# Patient Record
Sex: Male | Born: 1940 | Race: White | Hispanic: No | Marital: Married | Smoking: Never smoker
Health system: Southern US, Community
[De-identification: ages and names within clinical notes are randomized; demographics above are authoritative.]

## PROBLEM LIST (undated history)

## (undated) DIAGNOSIS — B029 Zoster without complications: Secondary | ICD-10-CM

## (undated) DIAGNOSIS — R059 Cough, unspecified: Secondary | ICD-10-CM

## (undated) DIAGNOSIS — M1990 Unspecified osteoarthritis, unspecified site: Secondary | ICD-10-CM

## (undated) DIAGNOSIS — E559 Vitamin D deficiency, unspecified: Secondary | ICD-10-CM

## (undated) DIAGNOSIS — R1310 Dysphagia, unspecified: Secondary | ICD-10-CM

## (undated) DIAGNOSIS — E785 Hyperlipidemia, unspecified: Secondary | ICD-10-CM

## (undated) DIAGNOSIS — N400 Enlarged prostate without lower urinary tract symptoms: Secondary | ICD-10-CM

## (undated) DIAGNOSIS — M545 Low back pain, unspecified: Secondary | ICD-10-CM

## (undated) DIAGNOSIS — K5730 Diverticulosis of large intestine without perforation or abscess without bleeding: Secondary | ICD-10-CM

## (undated) DIAGNOSIS — Z125 Encounter for screening for malignant neoplasm of prostate: Secondary | ICD-10-CM

## (undated) DIAGNOSIS — H269 Unspecified cataract: Secondary | ICD-10-CM

## (undated) DIAGNOSIS — R350 Frequency of micturition: Secondary | ICD-10-CM

## (undated) DIAGNOSIS — R05 Cough: Secondary | ICD-10-CM

## (undated) DIAGNOSIS — Q150 Congenital glaucoma: Secondary | ICD-10-CM

## (undated) DIAGNOSIS — M79609 Pain in unspecified limb: Secondary | ICD-10-CM

## (undated) DIAGNOSIS — I4949 Other premature depolarization: Secondary | ICD-10-CM

## (undated) DIAGNOSIS — H409 Unspecified glaucoma: Secondary | ICD-10-CM

## (undated) DIAGNOSIS — M722 Plantar fascial fibromatosis: Secondary | ICD-10-CM

## (undated) DIAGNOSIS — K225 Diverticulum of esophagus, acquired: Secondary | ICD-10-CM

## (undated) DIAGNOSIS — Z79899 Other long term (current) drug therapy: Secondary | ICD-10-CM

## (undated) DIAGNOSIS — I781 Nevus, non-neoplastic: Secondary | ICD-10-CM

## (undated) DIAGNOSIS — I10 Essential (primary) hypertension: Secondary | ICD-10-CM

## (undated) DIAGNOSIS — R209 Unspecified disturbances of skin sensation: Secondary | ICD-10-CM

## (undated) DIAGNOSIS — R739 Hyperglycemia, unspecified: Secondary | ICD-10-CM

## (undated) DIAGNOSIS — L299 Pruritus, unspecified: Secondary | ICD-10-CM

## (undated) HISTORY — DX: Other premature depolarization: I49.49

## (undated) HISTORY — DX: Hyperglycemia, unspecified: R73.9

## (undated) HISTORY — DX: Hyperlipidemia, unspecified: E78.5

## (undated) HISTORY — DX: Low back pain, unspecified: M54.50

## (undated) HISTORY — DX: Pain in unspecified limb: M79.609

## (undated) HISTORY — DX: Nevus, non-neoplastic: I78.1

## (undated) HISTORY — DX: Plantar fascial fibromatosis: M72.2

## (undated) HISTORY — DX: Pruritus, unspecified: L29.9

## (undated) HISTORY — DX: Diverticulosis of large intestine without perforation or abscess without bleeding: K57.30

## (undated) HISTORY — DX: Unspecified glaucoma: H40.9

## (undated) HISTORY — DX: Vitamin D deficiency, unspecified: E55.9

## (undated) HISTORY — PX: LARYNGOSCOPY: SUR817

## (undated) HISTORY — DX: Frequency of micturition: R35.0

## (undated) HISTORY — DX: Encounter for screening for malignant neoplasm of prostate: Z12.5

## (undated) HISTORY — DX: Cough: R05

## (undated) HISTORY — DX: Cough, unspecified: R05.9

## (undated) HISTORY — DX: Unspecified disturbances of skin sensation: R20.9

## (undated) HISTORY — DX: Dysphagia, unspecified: R13.10

## (undated) HISTORY — DX: Low back pain: M54.5

## (undated) HISTORY — DX: Diverticulum of esophagus, acquired: K22.5

## (undated) HISTORY — DX: Unspecified osteoarthritis, unspecified site: M19.90

## (undated) HISTORY — DX: Unspecified cataract: H26.9

## (undated) HISTORY — DX: Zoster without complications: B02.9

## (undated) HISTORY — DX: Benign prostatic hyperplasia without lower urinary tract symptoms: N40.0

## (undated) HISTORY — DX: Essential (primary) hypertension: I10

## (undated) HISTORY — DX: Other long term (current) drug therapy: Z79.899

---
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
## 1975-10-05 HISTORY — PX: CATARACT EXTRACTION BILATERAL W/ ANTERIOR VITRECTOMY: SHX1304

## 1991-10-05 HISTORY — PX: HERNIA REPAIR: SHX51

## 1997-10-04 HISTORY — PX: FLEXIBLE SIGMOIDOSCOPY: SHX1649

## 2002-08-14 ENCOUNTER — Emergency Department (HOSPITAL_COMMUNITY): Admission: EM | Admit: 2002-08-14 | Discharge: 2002-08-14 | Payer: Self-pay | Admitting: Emergency Medicine

## 2002-08-14 ENCOUNTER — Encounter: Payer: Self-pay | Admitting: Emergency Medicine

## 2005-08-10 ENCOUNTER — Ambulatory Visit: Payer: Self-pay | Admitting: Internal Medicine

## 2005-08-12 ENCOUNTER — Ambulatory Visit (HOSPITAL_COMMUNITY): Admission: RE | Admit: 2005-08-12 | Discharge: 2005-08-12 | Payer: Self-pay | Admitting: Internal Medicine

## 2006-10-04 HISTORY — PX: INCISIONAL HERNIA REPAIR: SHX193

## 2007-12-01 ENCOUNTER — Encounter: Admission: RE | Admit: 2007-12-01 | Discharge: 2007-12-01 | Payer: Self-pay | Admitting: Otolaryngology

## 2009-08-07 ENCOUNTER — Encounter: Admission: RE | Admit: 2009-08-07 | Discharge: 2009-08-07 | Payer: Self-pay | Admitting: Otolaryngology

## 2009-09-03 ENCOUNTER — Ambulatory Visit (HOSPITAL_COMMUNITY): Admission: RE | Admit: 2009-09-03 | Discharge: 2009-09-04 | Payer: Self-pay | Admitting: Otolaryngology

## 2009-09-16 ENCOUNTER — Encounter: Payer: Self-pay | Admitting: Internal Medicine

## 2010-01-26 ENCOUNTER — Encounter: Admission: RE | Admit: 2010-01-26 | Discharge: 2010-01-26 | Payer: Self-pay | Admitting: Otolaryngology

## 2010-01-27 ENCOUNTER — Encounter: Payer: Self-pay | Admitting: Internal Medicine

## 2010-12-15 ENCOUNTER — Emergency Department (HOSPITAL_COMMUNITY)
Admission: EM | Admit: 2010-12-15 | Discharge: 2010-12-15 | Disposition: A | Discharge status: Home / Self Care | Payer: PRIVATE HEALTH INSURANCE | Attending: Emergency Medicine | Admitting: Emergency Medicine

## 2010-12-15 ENCOUNTER — Emergency Department (HOSPITAL_COMMUNITY): Payer: PRIVATE HEALTH INSURANCE

## 2010-12-15 DIAGNOSIS — S0003XA Contusion of scalp, initial encounter: Secondary | ICD-10-CM | POA: Insufficient documentation

## 2010-12-15 DIAGNOSIS — R51 Headache: Secondary | ICD-10-CM | POA: Insufficient documentation

## 2010-12-15 DIAGNOSIS — R079 Chest pain, unspecified: Secondary | ICD-10-CM | POA: Insufficient documentation

## 2010-12-15 DIAGNOSIS — M79609 Pain in unspecified limb: Secondary | ICD-10-CM | POA: Insufficient documentation

## 2010-12-15 DIAGNOSIS — IMO0002 Reserved for concepts with insufficient information to code with codable children: Secondary | ICD-10-CM | POA: Insufficient documentation

## 2010-12-15 DIAGNOSIS — W108XXA Fall (on) (from) other stairs and steps, initial encounter: Secondary | ICD-10-CM | POA: Insufficient documentation

## 2010-12-15 DIAGNOSIS — S20219A Contusion of unspecified front wall of thorax, initial encounter: Secondary | ICD-10-CM | POA: Insufficient documentation

## 2010-12-15 DIAGNOSIS — S1093XA Contusion of unspecified part of neck, initial encounter: Secondary | ICD-10-CM | POA: Insufficient documentation

## 2010-12-15 DIAGNOSIS — I10 Essential (primary) hypertension: Secondary | ICD-10-CM | POA: Insufficient documentation

## 2011-01-06 LAB — CBC
HCT: 45.5 % (ref 39.0–52.0)
Hemoglobin: 15.8 g/dL (ref 13.0–17.0)
MCHC: 34.7 g/dL (ref 30.0–36.0)
MCV: 90.4 fL (ref 78.0–100.0)
Platelets: 186 10*3/uL (ref 150–400)
RBC: 5.03 MIL/uL (ref 4.22–5.81)
RDW: 13.3 % (ref 11.5–15.5)
WBC: 6.9 10*3/uL (ref 4.0–10.5)

## 2011-01-06 LAB — BASIC METABOLIC PANEL
BUN: 13 mg/dL (ref 6–23)
CO2: 29 mEq/L (ref 19–32)
Calcium: 10.1 mg/dL (ref 8.4–10.5)
Chloride: 103 mEq/L (ref 96–112)
Creatinine, Ser: 0.76 mg/dL (ref 0.4–1.5)
GFR calc Af Amer: >60 mL/min (ref >60)
GFR calc non Af Amer: >60 mL/min (ref >60)
Glucose, Bld: 106 mg/dL — ABNORMAL HIGH (ref 70–99)
Potassium: 4.3 mEq/L (ref 3.5–5.1)
Sodium: 140 mEq/L (ref 135–145)

## 2012-10-04 DIAGNOSIS — K5730 Diverticulosis of large intestine without perforation or abscess without bleeding: Secondary | ICD-10-CM

## 2012-10-04 HISTORY — DX: Diverticulosis of large intestine without perforation or abscess without bleeding: K57.30

## 2013-01-30 ENCOUNTER — Encounter: Payer: Self-pay | Admitting: Geriatric Medicine

## 2013-01-30 ENCOUNTER — Encounter: Payer: Self-pay | Admitting: Internal Medicine

## 2013-01-30 ENCOUNTER — Ambulatory Visit (INDEPENDENT_AMBULATORY_CARE_PROVIDER_SITE_OTHER): Payer: PRIVATE HEALTH INSURANCE | Admitting: Internal Medicine

## 2013-01-30 VITALS — BP 158/96 | HR 96 | Ht 74.0 in | Wt 196.0 lb

## 2013-01-30 DIAGNOSIS — N529 Male erectile dysfunction, unspecified: Secondary | ICD-10-CM | POA: Insufficient documentation

## 2013-01-30 DIAGNOSIS — R7989 Other specified abnormal findings of blood chemistry: Secondary | ICD-10-CM | POA: Insufficient documentation

## 2013-01-30 DIAGNOSIS — E785 Hyperlipidemia, unspecified: Secondary | ICD-10-CM

## 2013-01-30 DIAGNOSIS — I10 Essential (primary) hypertension: Secondary | ICD-10-CM

## 2013-01-30 NOTE — Progress Notes (Signed)
  Subjective:    Patient ID: Kirk [name], male    DOB: 1941/10/03, 72 y.o.   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 They want him to go to Occupational Therapy for a road test, but it would cost $300 or more. Has also been told to get a letter that says the drivers test needs to be done.  Has had a odd undescribable feeling in the lower abd. He thinks he needs some x-rays. No pain, change in the stools, or blood. Afebrile. Bladder seems OK.  He monitors BP at home. Says values there are in the normal range.   Review of Systems  Constitutional: Negative for fever, diaphoresis, activity change, appetite change and fatigue.  HENT: Negative for hearing loss, facial swelling, neck pain, neck stiffness and ear discharge.   Eyes: Positive for visual disturbance.       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 Sees Dr Hazle Quant on 02/08/13.  Respiratory: Negative.   Cardiovascular: Negative.   Gastrointestinal: Negative.   Endocrine:       Hyperglycemia.  Genitourinary: Negative.   Musculoskeletal: Negative.   Skin: Negative.   Allergic/Immunologic: Negative.   Neurological: Negative.   Hematological: Negative.   Psychiatric/Behavioral: Negative.       BP 158/96  Pulse 96  Ht 6\' 2"  (1.88 m)  Wt 196 lb (88.905 kg)  BMI 25.15 kg/m2  Objective:   Physical Exam  Constitutional: He is oriented to person, place, and time. He appears well-developed and well-nourished.  HENT:  Head: Normocephalic.  Right Ear: External ear normal.  Left Ear: External ear normal.  Nose: Nose normal.  Mouth/Throat: No oropharyngeal exudate.  Eyes:  Thick glasses. Congenital glaucoma. No ocular pain, tearing, or other symptoms.   Neck: Normal range of motion. Neck supple. No JVD present. No tracheal deviation present. No thyromegaly present.  Cardiovascular: Normal rate, regular rhythm, normal heart sounds and intact distal pulses.  Exam reveals no gallop and no friction rub.   No murmur  heard. Pulmonary/Chest: Effort normal and breath sounds normal. No respiratory distress. He has no wheezes. He has no rales.  Abdominal: Soft. Bowel sounds are normal. He exhibits no distension and no mass. There is no tenderness. There is no rebound.  Musculoskeletal: Normal range of motion. He exhibits no edema and no tenderness.  Lymphadenopathy:    He has no cervical adenopathy.  Neurological: He is alert and oriented to person, place, and time. He has normal reflexes. No cranial nerve deficit. Coordination normal.  Skin: Skin is warm and dry. No rash noted. He is not diaphoretic. No erythema. No pallor.  Psychiatric: He has a normal mood and affect. His behavior is normal. Judgment and thought content normal.       Assessment & Plan:  401.9 HTN    Other abnormal blood chemistry: elevated glucose. - Plan: Hemoglobin A1c, CMP  Impotence : he is able to get erection, but not complete intercourse to climax. His wife is not interested in sex anymore.  Other and unspecified hyperlipidemia - Plan: Lipid Panel  I do not find a reason to order abdominal X-rays. He should consider getting colonoscopy, but he will not commit to it today.

## 2013-01-30 NOTE — Patient Instructions (Addendum)
Continue current medications. Consider colonoscopy scheduling.

## 2013-02-01 DIAGNOSIS — I10 Essential (primary) hypertension: Secondary | ICD-10-CM | POA: Insufficient documentation

## 2013-02-01 DIAGNOSIS — E785 Hyperlipidemia, unspecified: Secondary | ICD-10-CM | POA: Insufficient documentation

## 2013-02-20 ENCOUNTER — Encounter: Payer: Self-pay | Admitting: Internal Medicine

## 2013-02-20 ENCOUNTER — Telehealth: Payer: Self-pay | Admitting: *Deleted

## 2013-02-20 NOTE — Telephone Encounter (Signed)
Error

## 2013-07-30 ENCOUNTER — Other Ambulatory Visit: Payer: PRIVATE HEALTH INSURANCE

## 2013-07-30 DIAGNOSIS — E785 Hyperlipidemia, unspecified: Secondary | ICD-10-CM

## 2013-07-30 DIAGNOSIS — R7989 Other specified abnormal findings of blood chemistry: Secondary | ICD-10-CM

## 2013-07-31 LAB — COMPREHENSIVE METABOLIC PANEL
ALT: 25 IU/L (ref 0–44)
AST: 21 IU/L (ref 0–40)
Albumin/Globulin Ratio: 2 (ref 1.1–2.5)
Albumin: 4.3 g/dL (ref 3.5–4.8)
Alkaline Phosphatase: 66 IU/L (ref 39–117)
BUN/Creatinine Ratio: 14 (ref 10–22)
BUN: 13 mg/dL (ref 8–27)
CO2: 24 mmol/L (ref 18–29)
Calcium: 9.5 mg/dL (ref 8.6–10.2)
Chloride: 100 mmol/L (ref 97–108)
Creatinine, Ser: 0.9 mg/dL (ref 0.76–1.27)
GFR calc Af Amer: 99 mL/min/{1.73_m2} (ref >59)
GFR calc non Af Amer: 86 mL/min/{1.73_m2} (ref >59)
Globulin, Total: 2.1 g/dL (ref 1.5–4.5)
Glucose: 103 mg/dL — ABNORMAL HIGH (ref 65–99)
Potassium: 4.8 mmol/L (ref 3.5–5.2)
Sodium: 140 mmol/L (ref 134–144)
Total Bilirubin: 0.7 mg/dL (ref 0.0–1.2)
Total Protein: 6.4 g/dL (ref 6.0–8.5)

## 2013-07-31 LAB — LIPID PANEL
Chol/HDL Ratio: 4.5 ratio units (ref 0.0–5.0)
Cholesterol, Total: 165 mg/dL (ref 100–199)
HDL: 37 mg/dL — ABNORMAL LOW (ref >39)
LDL Calculated: 92 mg/dL (ref 0–99)
Triglycerides: 178 mg/dL — ABNORMAL HIGH (ref 0–149)
VLDL Cholesterol Cal: 36 mg/dL (ref 5–40)

## 2013-07-31 LAB — HEMOGLOBIN A1C
Est. average glucose Bld gHb Est-mCnc: 134 mg/dL
Hgb A1c MFr Bld: 6.3 % — ABNORMAL HIGH (ref 4.8–5.6)

## 2013-08-01 ENCOUNTER — Ambulatory Visit (INDEPENDENT_AMBULATORY_CARE_PROVIDER_SITE_OTHER): Payer: PRIVATE HEALTH INSURANCE | Admitting: Internal Medicine

## 2013-08-01 ENCOUNTER — Encounter: Payer: Self-pay | Admitting: Internal Medicine

## 2013-08-01 VITALS — BP 138/80 | HR 74 | Temp 98.5°F | Wt 194.0 lb

## 2013-08-01 DIAGNOSIS — E559 Vitamin D deficiency, unspecified: Secondary | ICD-10-CM

## 2013-08-01 DIAGNOSIS — R739 Hyperglycemia, unspecified: Secondary | ICD-10-CM | POA: Insufficient documentation

## 2013-08-01 DIAGNOSIS — K225 Diverticulum of esophagus, acquired: Secondary | ICD-10-CM | POA: Insufficient documentation

## 2013-08-01 DIAGNOSIS — M545 Low back pain, unspecified: Secondary | ICD-10-CM

## 2013-08-01 DIAGNOSIS — H409 Unspecified glaucoma: Secondary | ICD-10-CM

## 2013-08-01 DIAGNOSIS — I10 Essential (primary) hypertension: Secondary | ICD-10-CM

## 2013-08-01 DIAGNOSIS — E785 Hyperlipidemia, unspecified: Secondary | ICD-10-CM

## 2013-08-01 DIAGNOSIS — R7309 Other abnormal glucose: Secondary | ICD-10-CM

## 2013-08-01 DIAGNOSIS — R1310 Dysphagia, unspecified: Secondary | ICD-10-CM | POA: Insufficient documentation

## 2013-08-01 DIAGNOSIS — N400 Enlarged prostate without lower urinary tract symptoms: Secondary | ICD-10-CM

## 2013-08-01 DIAGNOSIS — R109 Unspecified abdominal pain: Secondary | ICD-10-CM

## 2013-08-01 MED ORDER — IRBESARTAN-HYDROCHLOROTHIAZIDE 150-12.5 MG PO TABS: ORAL_TABLET | ORAL | Status: DC

## 2013-08-01 NOTE — Patient Instructions (Signed)
Continue current medications. 

## 2013-08-01 NOTE — Progress Notes (Signed)
Subjective:    Patient ID: Kirk [name], male    DOB: September 28, 1941, 72 y.o.   MRN: [id number]  HPI  Unspecified essential hypertension; controlled on Avalide for several years  Other and unspecified hyperlipidemia: controlled  Unspecified vitamin D deficiency: improved on supplements  Hyperglycemia: normal most recently  Dysphagia, unspecified(787.20): mild issue of food hanging. 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  Lumbago: mild. Does not hinder current daily activities.  Hyperlipidemia: controlled  Unspecified glaucoma(365.9);followed by ophth  Hypertrophy of prostate without urinary obstruction and other lower urinary tract symptoms (LUTS): denies increased nocturia, frequency, or pain.  Abdominal  pain, other specified site: intermiitent and mainly in the lower abd. Has not had change in stools or noted blood in stools. No fever. Gwenith Daily never had colonoscopy.   - Plan: Ambulatory referral to Gastroenterology   Current Outpatient Prescriptions on File Prior to Visit  Medication Sig Dispense Refill  . acetaminophen (TYLENOL) 325 MG tablet Take 650 mg by mouth. One tablet once daily as needed for pain      . brimonidine (ALPHAGAN P) 0.1 % SOLN One drop twice daily in each eye.      . cholecalciferol (VITAMIN D) 1000 UNITS tablet Take 1,000 Units by mouth daily.      . irbesartan-hydrochlorothiazide (AVALIDE) 150-12.5 MG per tablet 1 tablet. Take one tablet daily to control blood pressure      . timolol (BETIMOL) 0.5 % ophthalmic solution Place 1 drop into the right eye daily.       No current facility-administered medications on file prior to visit.   Allergies  Allergen Reactions  . Reserpine   . Sulfa Antibiotics    Family Status  Relation Status Death Age  . Mother Deceased   . Father Deceased   . Brother Deceased     stillborn   Family History  Problem Relation Age of Onset  .  Hypertension Mother   . Dementia Mother   . Kidney disease Father   . CAD Father    History   Social History  . Marital Status: Married    Spouse Name: N/A    Number of Children: N/A  . Years of Education: N/A   Social History Main Topics  . Smoking status: Never Smoker   . Smokeless tobacco: Never Used  . Alcohol Use: No  . Drug Use: No  . Sexual Activity: None   Other Topics Concern  . None   Social History Narrative  . None    Review of Systems  Constitutional: Negative for fever, chills, diaphoresis, activity change, appetite change, fatigue and unexpected weight change.  HENT: Positive for sinus pressure. Negative for ear pain, hearing loss, mouth sores, nosebleeds and sore throat.   Eyes: Positive for visual disturbance (hx glaucoma.).  Respiratory: Negative for cough, choking, shortness of breath, wheezing and stridor.   Cardiovascular: Negative for chest pain, palpitations and leg swelling.  Gastrointestinal: Positive for abdominal pain. Negative for nausea, diarrhea, constipation, blood in stool, abdominal distention and rectal pain.       Mild dysphagia.  Endocrine: Negative for cold intolerance, heat intolerance, polydipsia, polyphagia and polyuria.  Genitourinary: Negative for dysuria, urgency, frequency, flank pain, decreased urine volume, enuresis and difficulty urinating.  Musculoskeletal: Positive for back pain. Negative for arthralgias, gait problem, joint swelling, myalgias and neck pain.  Skin: Negative for color change, pallor and rash.  Allergic/Immunologic: Negative for environmental allergies, food allergies and immunocompromised state.  Neurological: Positive for headaches. Negative for dizziness, tremors, seizures, syncope, facial asymmetry, speech difficulty, weakness, light-headedness and numbness.  Hematological: Negative.   Psychiatric/Behavioral: Negative for suicidal ideas, hallucinations, behavioral problems, confusion, sleep disturbance,  self-injury, dysphoric mood, decreased concentration and agitation. The patient is not nervous/anxious and is not hyperactive.        Objective:BP 138/80  Pulse 74  Temp(Src) 98.5 F (36.9 C) (Oral)  Wt 194 lb (87.998 kg)  BMI 24.9 kg/m2  SpO2 97%    Physical Exam  Constitutional: He is oriented to person, place, and time. He appears well-developed and well-nourished. No distress.  Mildly overweight  HENT:  Head: Normocephalic and atraumatic.  Right Ear: External ear normal.  Left Ear: External ear normal.  Nose: Nose normal.  Some missing teeth.  Eyes: Conjunctivae and EOM are normal.  Abnormal pupils that are small and eccentric. Correctivelenses.  Neck: No JVD present. No tracheal deviation present. No thyromegaly present.  Cardiovascular: Normal rate, regular rhythm, normal heart sounds and intact distal pulses.  Exam reveals no gallop and no friction rub.   No murmur heard. Pulmonary/Chest: No respiratory distress. He has no wheezes. He has no rales.  Abdominal: He exhibits no distension and no mass. There is no tenderness. There is no rebound.  Genitourinary: Prostate normal and penis normal. Guaiac negative stool. No penile tenderness.  Musculoskeletal: Normal range of motion. He exhibits no edema and no tenderness.  Lymphadenopathy:    He has no cervical adenopathy.  Neurological: He is alert and oriented to person, place, and time. He has normal reflexes. No cranial nerve deficit. Coordination normal.  Skin:  Multiple SK.  Psychiatric: He has a normal mood and affect. His behavior is normal. Judgment and thought content normal.      Appointment on 07/30/2013  Component Date Value Range Status  . Cholesterol, Total 07/30/2013 165  100 - 199 mg/dL Final  . Triglycerides 07/30/2013 178* 0 - 149 mg/dL Final  . HDL 16/07/9603 37* >39 mg/dL Final   Comment: According to ATP-III Guidelines, HDL-C >59 mg/dL is considered a                          negative risk factor  for CHD.  Marland Kitchen VLDL Cholesterol Cal 07/30/2013 36  5 - 40 mg/dL Final  . LDL Calculated 07/30/2013 92  0 - 99 mg/dL Final  . Chol/HDL Ratio 07/30/2013 4.5  0.0 - 5.0 ratio units Final   Comment:                                   T. Chol/HDL Ratio                                                                      Men  Women                                                        1/2 Avg.Risk  3.4  3.3                                                            Avg.Risk  5.0    4.4                                                         2X Avg.Risk  9.6    7.1                                                         3X Avg.Risk 23.4   11.0  . Hemoglobin A1C 07/30/2013 6.3* 4.8 - 5.6 % Final   Comment:          Increased risk for diabetes: 5.7 - 6.4                                   Diabetes: >6.4                                   Glycemic control for adults with diabetes: <7.0  . Estimated average glucose 07/30/2013 134   Final  . Glucose 07/30/2013 103* 65 - 99 mg/dL Final  . BUN 40/98/1191 13  8 - 27 mg/dL Final  . Creatinine, Ser 07/30/2013 0.90  0.76 - 1.27 mg/dL Final  . GFR calc non Af Amer 07/30/2013 86  >59 mL/min/1.73 Final  . GFR calc Af Amer 07/30/2013 99  >59 mL/min/1.73 Final  . BUN/Creatinine Ratio 07/30/2013 14  10 - 22 Final  . Sodium 07/30/2013 140  134 - 144 mmol/L Final  . Potassium 07/30/2013 4.8  3.5 - 5.2 mmol/L Final  . Chloride 07/30/2013 100  97 - 108 mmol/L Final  . CO2 07/30/2013 24  18 - 29 mmol/L Final  . Calcium 07/30/2013 9.5  8.6 - 10.2 mg/dL Final  . Total Protein 07/30/2013 6.4  6.0 - 8.5 g/dL Final  . Albumin 47/82/9562 4.3  3.5 - 4.8 g/dL Final  . Globulin, Total 07/30/2013 2.1  1.5 - 4.5 g/dL Final  . Albumin/Globulin Ratio 07/30/2013 2.0  1.1 - 2.5 Final  . Total Bilirubin 07/30/2013 0.7  0.0 - 1.2 mg/dL Final  . Alkaline Phosphatase 07/30/2013 66  39 - 117 IU/L Final  . AST 07/30/2013 21  0 - 40 IU/L Final  . ALT 07/30/2013 25  0 - 44 IU/L Final    08/01/13 EKG: rate 74. LAD. LAFB.    Assessment & Plan:  Unspecified essential hypertension: controlled. Continue irbesartan-hydrochlorothiazide (AVALIDE) 150-12.5 MG per tablet  Other and unspecified hyperlipidemia - Plan: Lipid panel  Unspecified vitamin D deficiency: continue OTC supplements  Hyperglycemia ; observe  Dysphagia, unspecified(787.20): observe  Diverticulum of esophagus, acquired:observe  Lumbago: observe  Unspecified glaucoma(365.9): continue ophth follow up  Hypertrophy of prostate without urinary obstruction  and other lower urinary tract symptoms (LUTS): no new orders  Abdominal  pain, other specified site: vague intermittent lower abd discomfort. Never had colonoscopy. Had sigmoidoscopy in 1999 that was unremarkable \  - Plan: Ambulatory referral to Gastroenterology

## 2013-08-03 ENCOUNTER — Encounter: Payer: Self-pay | Admitting: *Deleted

## 2013-08-08 ENCOUNTER — Encounter: Payer: Self-pay | Admitting: Physician Assistant

## 2013-08-08 ENCOUNTER — Ambulatory Visit (INDEPENDENT_AMBULATORY_CARE_PROVIDER_SITE_OTHER): Payer: PRIVATE HEALTH INSURANCE | Admitting: Physician Assistant

## 2013-08-08 VITALS — BP 150/82 | HR 76 | Ht 74.0 in | Wt 195.8 lb

## 2013-08-08 DIAGNOSIS — Z1211 Encounter for screening for malignant neoplasm of colon: Secondary | ICD-10-CM

## 2013-08-08 MED ORDER — MOVIPREP 100 G PO SOLR: 1.0000 | Freq: Once | ORAL | Status: DC

## 2013-08-08 NOTE — Progress Notes (Signed)
Subjective:    Patient ID: Kirk [name], male    DOB: 17-Aug-1941, 72 y.o.   MRN: [id number]  HPI  Kirk [name] is a pleasant 72 year old male referred today by Dr. Frederik Pear for screening colonoscopy. Patient states that he had a sigmoidoscopy by Dr. Chilton Si done in his office around 15 years ago but has not had a full colonoscopy. He currently denies any GI symptoms specifically he denies any abdominal pain changes in bowel habits problems with diarrhea or constipation and no melena or hematochezia. His family history is negative for colon cancer polyps as far as he is aware. Patient does have history of hypertension hyperlipidemia, glaucoma and had a repair of a Zenker's diverticulum several years ago by Dr.Wolicki. He states that he has occasional choking primarily with liquids no problems with solids and no regular heartburn or indigestion.    Review of Systems  Constitutional: Negative.   HENT: Negative.   Eyes: Negative.   Respiratory: Positive for choking.   Cardiovascular: Negative.   Gastrointestinal: Negative.   Endocrine: Negative.   Genitourinary: Negative.   Musculoskeletal: Negative.   Skin: Negative.   Allergic/Immunologic: Negative.   Neurological: Negative.   Hematological: Negative.   Psychiatric/Behavioral: Negative.    Outpatient Prescriptions Prior to Visit  Medication Sig Dispense Refill  . acetaminophen (TYLENOL) 325 MG tablet Take 650 mg by mouth. One tablet once daily as needed for pain      . brimonidine (ALPHAGAN P) 0.1 % SOLN One drop twice daily in each eye.      . cholecalciferol (VITAMIN D) 1000 UNITS tablet Take 1,000 Units by mouth daily.      . irbesartan-hydrochlorothiazide (AVALIDE) 150-12.5 MG per tablet Take one tablet daily to control blood pressure  30 tablet  11  . timolol (BETIMOL) 0.5 % ophthalmic solution Place 1 drop into the right eye daily.       No facility-administered medications prior to visit.   Allergies  Allergen Reactions  .  Reserpine   . Sulfa Antibiotics    Patient Active Problem List   Diagnosis Date Noted  . Unspecified vitamin D deficiency   . Hyperglycemia   . Dysphagia, unspecified(787.20)   . Diverticulum of esophagus, acquired   . Lumbago   . Hyperlipidemia   . Unspecified glaucoma(365.9)   . 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 Unspecified essential hypertension 02/01/2013  . Other and unspecified hyperlipidemia 02/01/2013  . 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   History  Substance Use Topics  . Smoking status: Never Smoker   . Smokeless tobacco: Never Used  . Alcohol Use: No   family history includes CAD in his father; Dementia in his mother; Hypertension in his mother; Kidney disease in his father. There is no history of Colon cancer, Throat cancer, Diabetes, or Liver disease.     Objective:   Physical Exam    white male in no acute distress, accompanied by his wife; blood pressure 150/82 pulse 76 height 6 foot 2 weight 195. HEENT ;nontraumatic normocephalic EOMI PERRLA sclera anicteric, Supple; no JVD, Cardiovascular ;regular rate and rhythm with S1-S2 no murmur or gallop, Pulmonary ;clear bilaterally, Abdomen; soft nontender nondistended bowel sounds are active there is no palpable mass or hepatosplenomegaly, Rectal ;exam not done, Extremities; no clubbing cyanosis or edema skin warm and dry, Psych; mood and affect normal and appropriate.        Assessment & Plan:  #51  72 year old male referred for screening  colonoscopy, currently asymptomatic, average risk with no history of prior colonoscopy. #2 history of Zenker's diverticulum status post repair #3 hypertension #4 hyperlipidemia #5 glaucoma  Plan; patient is scheduled for colonoscopy with Dr. Marina Goodell procedure was discussed in detail with the patient and his wife and they are agreeable to proceed.

## 2013-08-08 NOTE — Patient Instructions (Signed)
You have been scheduled for a colonoscopy with propofol. Please follow written instructions given to you at your visit today.  Please pick up your prep kit at the pharmacy within the next 1-3 days. If you use inhalers (even only as needed), please bring them with you on the day of your procedure.  

## 2013-08-08 NOTE — Progress Notes (Signed)
Agree with initial assessment and plans as outlined 

## 2013-08-14 ENCOUNTER — Encounter: Payer: Self-pay | Admitting: Internal Medicine

## 2013-08-27 ENCOUNTER — Telehealth: Payer: Self-pay | Admitting: Internal Medicine

## 2013-08-28 NOTE — Telephone Encounter (Signed)
Let the patient's wife know we have a Moviprep voucher at our front desk for him. He can either pick it up tomorrow 11-26 before 2 PM,( we are leaving early for the holdiay) . If he doesn't get it tomorrow , then he can pick it up Monday 09-03-2013.

## 2013-09-06 ENCOUNTER — Ambulatory Visit (AMBULATORY_SURGERY_CENTER): Payer: PRIVATE HEALTH INSURANCE | Admitting: Internal Medicine

## 2013-09-06 ENCOUNTER — Encounter: Payer: Self-pay | Admitting: Internal Medicine

## 2013-09-06 VITALS — BP 144/86 | HR 62 | Temp 96.8°F | Resp 25 | Ht 74.0 in | Wt 195.0 lb

## 2013-09-06 DIAGNOSIS — Z1211 Encounter for screening for malignant neoplasm of colon: Secondary | ICD-10-CM

## 2013-09-06 HISTORY — PX: COLONOSCOPY: SHX174

## 2013-09-06 LAB — HM SIGMOIDOSCOPY

## 2013-09-06 MED ORDER — SODIUM CHLORIDE 0.9 % IV SOLN: 500.0000 mL | INTRAVENOUS | Status: DC

## 2013-09-06 NOTE — Op Note (Signed)
[facility] [address] [place], [address]   COLONOSCOPY PROCEDURE REPORT  PATIENT: Kirk, [name]  MR#: [id number] BIRTHDATE: November 20, 1940 , 72  yrs. old GENDER: Male ENDOSCOPIST: Roxy Cedar, MD REFERRED XB:JYNWGN Chilton Si, M.D. PROCEDURE DATE:  09/06/2013 PROCEDURE:   Colonoscopy, screening First Screening Colonoscopy - Avg.  risk and is 50 yrs.  old or older Yes.  Prior Negative Screening - Now for repeat screening. N/A  History of Adenoma - Now for follow-up colonoscopy & has been > or = to 3 yrs.  N/A  Polyps Removed Today? No.  Recommend repeat exam, <10 yrs? No. ASA CLASS:   Class II INDICATIONS:average risk screening. MEDICATIONS: MAC sedation, administered by CRNA and propofol (Diprivan) 250mg  IV  DESCRIPTION OF PROCEDURE:   After the risks benefits and alternatives of the procedure were thoroughly explained, informed consent was obtained.  A digital rectal exam revealed hemorrhoids. The [id number]  endoscope was introduced through the anus and advanced to the cecum, which was identified by both the appendix and ileocecal valve. No adverse events experienced.   The quality of the prep was good, using MoviPrep  The instrument was then slowly withdrawn as the colon was fully examined.    COLON FINDINGS: Severe diverticulosis was noted The finding was in the left colon.   The colon was otherwise normal.  There was no inflammation, polyps or cancers unless previously stated. Retroflexed views revealed internal hemorrhoids. The time to cecum=5 minutes 08 seconds.  Withdrawal time=10 minutes 16 seconds. The scope was withdrawn and the procedure completed.  COMPLICATIONS: There were no complications.  ENDOSCOPIC IMPRESSION: 1.   Severe diverticulosis was noted in the left colon 2.   The colon was otherwise normal  RECOMMENDATIONS: 1. Return to the care of your primary provider.  GI follow up as needed   eSigned:  Roxy Cedar, MD  09/06/2013 3:35 PM   cc: Murray Hodgkins, MD and The Patient

## 2013-09-06 NOTE — Patient Instructions (Signed)
YOU HAD AN ENDOSCOPIC PROCEDURE TODAY AT THE [facility]: Refer to the procedure report that was given to you for any specific questions about what was found during the examination.  If the procedure report does not answer your questions, please call your gastroenterologist to clarify.  If you requested that your care partner not be given the details of your procedure findings, then the procedure report has been included in a sealed envelope for you to review at your convenience later.  YOU SHOULD EXPECT: Some feelings of bloating in the abdomen. Passage of more gas than usual.  Walking can help get rid of the air that was put into your GI tract during the procedure and reduce the bloating. If you had a lower endoscopy (such as a colonoscopy or flexible sigmoidoscopy) you may notice spotting of blood in your stool or on the toilet paper. If you underwent a bowel prep for your procedure, then you may not have a normal bowel movement for a few days.  DIET: Your first meal following the procedure should be a light meal and then it is ok to progress to your normal diet.  A half-sandwich or bowl of soup is an example of a good first meal.  Heavy or fried foods are harder to digest and may make you feel nauseous or bloated.  Likewise meals heavy in dairy and vegetables can cause extra gas to form and this can also increase the bloating.  Drink plenty of fluids but you should avoid alcoholic beverages for 24 hours.  ACTIVITY: Your care partner should take you home directly after the procedure.  You should plan to take it easy, moving slowly for the rest of the day.  You can resume normal activity the day after the procedure however you should NOT DRIVE or use heavy machinery for 24 hours (because of the sedation medicines used during the test).    SYMPTOMS TO REPORT IMMEDIATELY: A gastroenterologist can be reached at any hour.  During normal business hours, 8:30 AM to 5:00 PM Monday through Friday,  call [phone].  After hours and on weekends, please call the GI answering service at [phone] who will take a message and have the physician on call contact you.   Following lower endoscopy (colonoscopy or flexible sigmoidoscopy):  Excessive amounts of blood in the stool  Significant tenderness or worsening of abdominal pains  Swelling of the abdomen that is new, acute  Fever of 100F or higher   FOLLOW UP: If any biopsies were taken you will be contacted by phone or by letter within the next 1-3 weeks.  Call your gastroenterologist if you have not heard about the biopsies in 3 weeks.  Our staff will call the home number listed on your records the next business day following your procedure to check on you and address any questions or concerns that you may have at that time regarding the information given to you following your procedure. This is a courtesy call and so if there is no answer at the home number and we have not heard from you through the emergency physician on call, we will assume that you have returned to your regular daily activities without incident.  SIGNATURES/CONFIDENTIALITY: You and/or your care partner have signed paperwork which will be entered into your electronic medical record.  These signatures attest to the fact that that the information above on your After Visit Summary has been reviewed and is understood.  Full responsibility of the confidentiality of   this discharge information lies with you and/or your care-partner.  Diverticulosis and high fiber diet information given. 

## 2013-09-06 NOTE — Progress Notes (Signed)
Patient did not experience any of the following events: a burn prior to discharge; a fall within the facility; wrong site/side/patient/procedure/implant event; or a hospital transfer or hospital admission upon discharge from the facility. (G8907) Patient did not have preoperative order for IV antibiotic SSI prophylaxis. (G8918)  

## 2013-09-07 ENCOUNTER — Telehealth: Payer: Self-pay | Admitting: *Deleted

## 2013-09-07 NOTE — Telephone Encounter (Signed)
  Follow up Call-  Call back number 09/06/2013  Post procedure Call Back phone  # [phone]  Permission to leave phone message Yes     Patient questions:  Do you have a fever, pain , or abdominal swelling? no Pain Score  0 *  Have you tolerated food without any problems? yes  Have you been able to return to your normal activities? yes  Do you have any questions about your discharge instructions: Diet   no Medications  no Follow up visit  no  Do you have questions or concerns about your Care? no  Actions: * If pain score is 4 or above: No action needed, pain <4.

## 2013-09-13 ENCOUNTER — Encounter: Payer: Self-pay | Admitting: Internal Medicine

## 2013-09-13 DIAGNOSIS — K5730 Diverticulosis of large intestine without perforation or abscess without bleeding: Secondary | ICD-10-CM | POA: Insufficient documentation

## 2014-01-28 ENCOUNTER — Other Ambulatory Visit: Payer: PRIVATE HEALTH INSURANCE

## 2014-01-28 DIAGNOSIS — R739 Hyperglycemia, unspecified: Secondary | ICD-10-CM

## 2014-01-28 DIAGNOSIS — E785 Hyperlipidemia, unspecified: Secondary | ICD-10-CM

## 2014-01-29 LAB — LIPID PANEL
Chol/HDL Ratio: 4.7 ratio units (ref 0.0–5.0)
Cholesterol, Total: 186 mg/dL (ref 100–199)
HDL: 40 mg/dL (ref >39)
LDL Calculated: 104 mg/dL — ABNORMAL HIGH (ref 0–99)
Triglycerides: 208 mg/dL — ABNORMAL HIGH (ref 0–149)
VLDL Cholesterol Cal: 42 mg/dL — ABNORMAL HIGH (ref 5–40)

## 2014-01-29 LAB — COMPREHENSIVE METABOLIC PANEL
ALT: 26 IU/L (ref 0–44)
AST: 23 IU/L (ref 0–40)
Albumin/Globulin Ratio: 1.6 (ref 1.1–2.5)
Albumin: 4.3 g/dL (ref 3.5–4.8)
Alkaline Phosphatase: 70 IU/L (ref 39–117)
BUN/Creatinine Ratio: 15 (ref 10–22)
BUN: 14 mg/dL (ref 8–27)
CO2: 22 mmol/L (ref 18–29)
Calcium: 9.7 mg/dL (ref 8.6–10.2)
Chloride: 98 mmol/L (ref 97–108)
Creatinine, Ser: 0.94 mg/dL (ref 0.76–1.27)
GFR calc Af Amer: 93 mL/min/{1.73_m2} (ref >59)
GFR calc non Af Amer: 81 mL/min/{1.73_m2} (ref >59)
Globulin, Total: 2.7 g/dL (ref 1.5–4.5)
Glucose: 109 mg/dL — ABNORMAL HIGH (ref 65–99)
Potassium: 4.2 mmol/L (ref 3.5–5.2)
Sodium: 137 mmol/L (ref 134–144)
Total Bilirubin: 0.8 mg/dL (ref 0.0–1.2)
Total Protein: 7 g/dL (ref 6.0–8.5)

## 2014-01-30 ENCOUNTER — Ambulatory Visit (INDEPENDENT_AMBULATORY_CARE_PROVIDER_SITE_OTHER): Payer: PRIVATE HEALTH INSURANCE | Admitting: Internal Medicine

## 2014-01-30 ENCOUNTER — Encounter: Payer: Self-pay | Admitting: Internal Medicine

## 2014-01-30 VITALS — BP 122/78 | HR 76 | Temp 97.6°F | Wt 197.0 lb

## 2014-01-30 DIAGNOSIS — H409 Unspecified glaucoma: Secondary | ICD-10-CM

## 2014-01-30 DIAGNOSIS — M545 Low back pain, unspecified: Secondary | ICD-10-CM

## 2014-01-30 DIAGNOSIS — R739 Hyperglycemia, unspecified: Secondary | ICD-10-CM

## 2014-01-30 DIAGNOSIS — R1310 Dysphagia, unspecified: Secondary | ICD-10-CM

## 2014-01-30 DIAGNOSIS — R7309 Other abnormal glucose: Secondary | ICD-10-CM

## 2014-01-30 DIAGNOSIS — E785 Hyperlipidemia, unspecified: Secondary | ICD-10-CM

## 2014-01-30 DIAGNOSIS — I10 Essential (primary) hypertension: Secondary | ICD-10-CM

## 2014-01-30 DIAGNOSIS — N400 Enlarged prostate without lower urinary tract symptoms: Secondary | ICD-10-CM

## 2014-01-30 MED ORDER — ZOSTER VACCINE LIVE 19400 UNT/0.65ML ~~LOC~~ SOLR: 0.6500 mL | Freq: Once | SUBCUTANEOUS | Status: DC

## 2014-01-30 NOTE — Patient Instructions (Signed)
Continue current medications. 

## 2014-01-30 NOTE — Progress Notes (Signed)
Patient ID: RAYNALD ROUILLARD, male   DOB: May 17, 1941, 73 y.o.   MRN: [id number]    Location:  [facility]   Place of Service: OFFICE    Allergies  Allergen Reactions  . Reserpine   . Sulfa Antibiotics     Chief Complaint  Patient presents with  . Medical Management of Chronic Issues    6 month-up, discuss labs (copy printed)    HPI:  Hypertension: controlled  Hyperlipidemia: controlled  Hyperglycemia: controlled  Dysphagia, unspecified(787.20)  Lumbago: resolved  Hypertrophy of prostate without urinary obstruction and other lower urinary tract symptoms (LUTS): denies hesitation or stranguria    Medications: Patient's Medications  New Prescriptions   No medications on file  Previous Medications   ACETAMINOPHEN (TYLENOL) 325 MG TABLET    Take 650 mg by mouth. One tablet once daily as needed for pain   BRIMONIDINE (ALPHAGAN P) 0.1 % SOLN    One drop twice daily in each eye.   CHOLECALCIFEROL (VITAMIN D) 1000 UNITS TABLET    Take 1,000 Units by mouth daily.   IRBESARTAN-HYDROCHLOROTHIAZIDE (AVALIDE) 150-12.5 MG PER TABLET    Take one tablet daily to control blood pressure   TIMOLOL (TIMOPTIC) 0.5 % OPHTHALMIC SOLUTION      Modified Medications   Modified Medication Previous Medication   ZOSTER VACCINE LIVE, PF, (ZOSTAVAX) 03474 UNT/0.65ML INJECTION zoster vaccine live, PF, (ZOSTAVAX) 25956 UNT/0.65ML injection      Inject 19,400 Units into the skin once.    Inject 0.65 mLs into the skin once.  Discontinued Medications   TIMOLOL (BETIMOL) 0.5 % OPHTHALMIC SOLUTION    Place 1 drop into the right eye daily.   History   Social History Narrative   Married   Lives at home with wife   No regular exercise, but does his own yard work and household chores.   Encouraged him to take advantage of his membership at the [employer]   Review of Systems  Constitutional: Negative for fever, chills, diaphoresis, activity change, appetite change, fatigue and unexpected weight  change.  HENT: Negative for ear pain, hearing loss, mouth sores, nosebleeds, sinus pressure and sore throat.   Eyes: Positive for visual disturbance (hx glaucoma.).  Respiratory: Negative for cough, choking, shortness of breath, wheezing and stridor.   Cardiovascular: Negative for chest pain, palpitations and leg swelling.  Gastrointestinal: Negative for nausea, abdominal pain, diarrhea, constipation, blood in stool, abdominal distention and rectal pain.       Mild dysphagia.  Endocrine: Negative for cold intolerance, heat intolerance, polydipsia, polyphagia and polyuria.  Genitourinary: Negative for dysuria, urgency, frequency, flank pain, decreased urine volume, enuresis and difficulty urinating.  Musculoskeletal: Negative for arthralgias, back pain, gait problem, joint swelling, myalgias and neck pain.  Skin: Negative for color change, pallor and rash.  Allergic/Immunologic: Negative for environmental allergies, food allergies and immunocompromised state.  Neurological: Positive for headaches. Negative for dizziness, tremors, seizures, syncope, facial asymmetry, speech difficulty, weakness, light-headedness and numbness.  Hematological: Negative.   Psychiatric/Behavioral: Negative for suicidal ideas, hallucinations, behavioral problems, confusion, sleep disturbance, self-injury, dysphoric mood, decreased concentration and agitation. The patient is not nervous/anxious and is not hyperactive.     Filed Vitals:   01/30/14 1145  BP: 122/78  Pulse: 76  Temp: 97.6 F (36.4 C)  TempSrc: Oral  Weight: 197 lb (89.359 kg)   Physical Exam  Constitutional: He is oriented to person, place, and time. He appears well-developed and well-nourished. No distress.  Mildly overweight  HENT:  Head: Normocephalic  and atraumatic.  Right Ear: External ear normal.  Left Ear: External ear normal.  Nose: Nose normal.  Some missing teeth.  Eyes: Conjunctivae and EOM are normal.  Abnormal pupils that are  small and eccentric. Correctivelenses.  Neck: No JVD present. No tracheal deviation present. No thyromegaly present.  Cardiovascular: Normal rate, regular rhythm, normal heart sounds and intact distal pulses.  Exam reveals no gallop and no friction rub.   No murmur heard. Pulmonary/Chest: No respiratory distress. He has no wheezes. He has no rales.  Abdominal: He exhibits no distension and no mass. There is no tenderness. There is no rebound.  Genitourinary: Prostate normal and penis normal. Guaiac negative stool. No penile tenderness.  Musculoskeletal: Normal range of motion. He exhibits no edema and no tenderness.  Lymphadenopathy:    He has no cervical adenopathy.  Neurological: He is alert and oriented to person, place, and time. He has normal reflexes. No cranial nerve deficit. Coordination normal.  Intact vibratory and monofilament sensations  Skin:  Multiple SK.  Psychiatric: He has a normal mood and affect. His behavior is normal. Judgment and thought content normal.     Labs reviewed: Appointment on 01/28/2014  Component Date Value Ref Range Status  . Glucose 01/28/2014 109* 65 - 99 mg/dL Final  . BUN 01/28/2014 14  8 - 27 mg/dL Final  . Creatinine, Ser 01/28/2014 0.94  0.76 - 1.27 mg/dL Final  . GFR calc non Af Amer 01/28/2014 81  >59 mL/min/1.73 Final  . GFR calc Af Amer 01/28/2014 93  >59 mL/min/1.73 Final  . BUN/Creatinine Ratio 01/28/2014 15  10 - 22 Final  . Sodium 01/28/2014 137  134 - 144 mmol/L Final  . Potassium 01/28/2014 4.2  3.5 - 5.2 mmol/L Final  . Chloride 01/28/2014 98  97 - 108 mmol/L Final  . CO2 01/28/2014 22  18 - 29 mmol/L Final  . Calcium 01/28/2014 9.7  8.6 - 10.2 mg/dL Final  . Total Protein 01/28/2014 7.0  6.0 - 8.5 g/dL Final  . Albumin 01/28/2014 4.3  3.5 - 4.8 g/dL Final  . Globulin, Total 01/28/2014 2.7  1.5 - 4.5 g/dL Final  . Albumin/Globulin Ratio 01/28/2014 1.6  1.1 - 2.5 Final  . Total Bilirubin 01/28/2014 0.8  0.0 - 1.2 mg/dL Final  .  Alkaline Phosphatase 01/28/2014 70  39 - 117 IU/L Final  . AST 01/28/2014 23  0 - 40 IU/L Final  . ALT 01/28/2014 26  0 - 44 IU/L Final  . Cholesterol, Total 01/28/2014 186  100 - 199 mg/dL Final  . Triglycerides 01/28/2014 208* 0 - 149 mg/dL Final  . HDL 01/28/2014 40  >39 mg/dL Final   Comment: According to ATP-III Guidelines, HDL-C >59 mg/dL is considered a                          negative risk factor for CHD.  Marland Kitchen VLDL Cholesterol Cal 01/28/2014 42* 5 - 40 mg/dL Final  . LDL Calculated 01/28/2014 104* 0 - 99 mg/dL Final  . Chol/HDL Ratio 01/28/2014 4.7  0.0 - 5.0 ratio units Final   Comment:                                   T. Chol/HDL Ratio  Men  Women                                                        1/2 Avg.Risk  3.4    3.3                                                            Avg.Risk  5.0    4.4                                                         2X Avg.Risk  9.6    7.1                                                         3X Avg.Risk 23.4   11.0      Assessment/Plan  1. Hypertension controlled - Comprehensive metabolic panel; Future  2. Hyperlipidemia controlled - Lipid panel; Future  3. Hyperglycemia controlled - Hemoglobin A1c; Future - Comprehensive metabolic panel; Future - Microalbumin / creatinine urine ratio; Future  4. Dysphagia, unspecified(787.20) asymptomatic  5. Lumbago resolved  6. Hypertrophy of prostate without urinary obstruction and other lower urinary tract symptoms (LUTS) asymptomatic  7. 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 Bing Plume. No change in vision. Pressure is controlled.

## 2014-08-05 ENCOUNTER — Other Ambulatory Visit: Payer: PRIVATE HEALTH INSURANCE

## 2014-08-05 DIAGNOSIS — E785 Hyperlipidemia, unspecified: Secondary | ICD-10-CM

## 2014-08-05 DIAGNOSIS — E538 Deficiency of other specified B group vitamins: Secondary | ICD-10-CM

## 2014-08-05 DIAGNOSIS — R739 Hyperglycemia, unspecified: Secondary | ICD-10-CM

## 2014-08-05 DIAGNOSIS — I10 Essential (primary) hypertension: Secondary | ICD-10-CM

## 2014-08-05 NOTE — Addendum Note (Signed)
Addended by: Jearld Adjutant on: 08/05/2014 09:43 AM   Modules accepted: Orders

## 2014-08-07 ENCOUNTER — Encounter: Payer: Self-pay | Admitting: Internal Medicine

## 2014-08-07 ENCOUNTER — Ambulatory Visit (INDEPENDENT_AMBULATORY_CARE_PROVIDER_SITE_OTHER): Payer: PRIVATE HEALTH INSURANCE | Admitting: Internal Medicine

## 2014-08-07 VITALS — BP 142/90 | HR 83 | Temp 98.0°F | Resp 10 | Ht 72.0 in | Wt 194.0 lb

## 2014-08-07 DIAGNOSIS — M545 Low back pain, unspecified: Secondary | ICD-10-CM

## 2014-08-07 DIAGNOSIS — H409 Unspecified glaucoma: Secondary | ICD-10-CM

## 2014-08-07 DIAGNOSIS — E785 Hyperlipidemia, unspecified: Secondary | ICD-10-CM

## 2014-08-07 DIAGNOSIS — R739 Hyperglycemia, unspecified: Secondary | ICD-10-CM

## 2014-08-07 DIAGNOSIS — I10 Essential (primary) hypertension: Secondary | ICD-10-CM

## 2014-08-07 DIAGNOSIS — R1310 Dysphagia, unspecified: Secondary | ICD-10-CM

## 2014-08-07 MED ORDER — IRBESARTAN-HYDROCHLOROTHIAZIDE 150-12.5 MG PO TABS: ORAL_TABLET | ORAL | Status: DC

## 2014-08-07 NOTE — Progress Notes (Signed)
Patient ID: Kirk [name], male   DOB: 02-08-1941, 73 y.o.   MRN: [id number]    HISTORY AND PHYSICAL  Location:    [facility]   Place of Service:   OFFICE  Extended Emergency Contact Information Primary Emergency Contact: Rana,Barbara Address: [address]          [place], [place] [address] [place] Phone: [phone] Relation: Spouse   Chief Complaint  Patient presents with  . Annual Exam    Yearly check-up, discuss labs (copy printed), EKG completed 07/2013.  . Medication Management    Discuss B12 injection for Neuropathy, tablet vs injection. Recommended by Neurologist     HPI:  Essential hypertension -CONTROLLED  Hyperglycemia: controlled  Hyperlipidemia: controlled  Dysphagia: resolved  Midline low back pain without sciatica: resolved  Glaucoma: unchanged    Past Medical History  Diagnosis Date  . Unspecified vitamin D deficiency   . Unspecified pruritic disorder   . Hyperglycemia   . Pain in limb   . Disturbance of skin sensation   . Nevus, non-neoplastic   . Urinary frequency   . Herpes zoster without mention of complication   . Cough   . Dysphagia, unspecified(787.20)   . Plantar fascial fibromatosis   . Other premature beats   . Hypertension   . Encounter for long-term (current) use of other medications   . Special screening for malignant neoplasm of prostate   . Diverticulum of esophagus, acquired   . Osteoarthrosis, unspecified whether generalized or localized, unspecified site   . 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 Unspecified glaucoma   . Lumbago   . Hyperlipidemia   . Zenker's diverticulum   . Diverticulosis of colon 2014    Past Surgical History  Procedure Laterality Date  . Cataract extraction bilateral w/ anterior vitrectomy  1977  . Hernia repair  1993  . Incisional hernia repair  2008  . Flexible sigmoidoscopy  1999  . Glaucoma surgery     congenital glaucoma  . Laryngoscopy      with stapling of zenkers diverticulum  . Colonoscopy  09/06/2013    [name]    Patient Care Team: Estill Dooms, MD as PCP - General (Internal Medicine) Linton Rump, MD as Consulting Physician (Ophthalmology) Lafayette Dragon, MD as Consulting Physician (Gastroenterology) Michelene Gardener., MD (Dermatology) Malka So, MD as Attending Physician (General Surgery)  History   Social History  . Marital Status: Married    Spouse Name: N/A    Number of Children: N/A  . Years of Education: N/A   Occupational History  . Not on file.   Social History Main Topics  . Smoking status: Never Smoker   . Smokeless tobacco: Never Used  . Alcohol Use: No  . Drug Use: No  . Sexual Activity: Not on file   Other Topics Concern  . Not on file   Social History Narrative   Married   Lives at home with wife   No regular exercise, but does his own yard work and household chores.     reports that he has never smoked. He has never used smokeless tobacco. He reports that he does not drink alcohol or use illicit drugs.  Immunization History  Administered Date(s) Administered  . Pneumococcal Conjugate-13 01/12/2012  . Td 03/05/2011    Allergies  Allergen Reactions  . Reserpine   . Sulfa Antibiotics     Medications:  Patient's Medications  New Prescriptions   No medications on file  Previous Medications   ACETAMINOPHEN (TYLENOL) 325 MG TABLET    Take 650 mg by mouth. One tablet once daily as needed for pain   BRIMONIDINE (ALPHAGAN P) 0.1 % SOLN    One drop twice daily in each eye.   CHOLECALCIFEROL (VITAMIN D) 1000 UNITS TABLET    Take 1,000 Units by mouth daily.   IRBESARTAN-HYDROCHLOROTHIAZIDE (AVALIDE) 150-12.5 MG PER TABLET    Take one tablet daily to control blood pressure   TIMOLOL (TIMOPTIC) 0.5 % OPHTHALMIC SOLUTION       ZOSTER VACCINE LIVE, PF, (ZOSTAVAX) 81191 UNT/0.65ML INJECTION    Inject 19,400 Units into the skin once.  Modified  Medications   No medications on file  Discontinued Medications   No medications on file     Review of Systems  Constitutional: Negative for fever, chills, diaphoresis, activity change, appetite change, fatigue and unexpected weight change.  HENT: Negative for ear pain, hearing loss, mouth sores, nosebleeds, sinus pressure and sore throat.   Eyes: Positive for visual disturbance (hx glaucoma.).  Respiratory: Negative for cough, choking, shortness of breath, wheezing and stridor.   Cardiovascular: Negative for chest pain, palpitations and leg swelling.  Gastrointestinal: Negative for nausea, abdominal pain, diarrhea, constipation, blood in stool, abdominal distention and rectal pain.  Endocrine: Negative for cold intolerance, heat intolerance, polydipsia, polyphagia and polyuria.  Genitourinary: Negative for dysuria, urgency, frequency, flank pain, decreased urine volume, enuresis and difficulty urinating.  Musculoskeletal: Negative for myalgias, back pain, joint swelling, arthralgias, gait problem and neck pain.  Skin: Negative for color change, pallor and rash.  Allergic/Immunologic: Negative for environmental allergies, food allergies and immunocompromised state.  Neurological: Positive for headaches. Negative for dizziness, tremors, seizures, syncope, facial asymmetry, speech difficulty, weakness, light-headedness and numbness.  Hematological: Negative.   Psychiatric/Behavioral: Negative for suicidal ideas, hallucinations, behavioral problems, confusion, sleep disturbance, self-injury, dysphoric mood, decreased concentration and agitation. The patient is not nervous/anxious and is not hyperactive.     Filed Vitals:   08/07/14 1504  BP: 142/90  Pulse: 83  Temp: 98 F (36.7 C)  TempSrc: Oral  Resp: 10  Height: 6' (1.829 m)  Weight: 194 lb (87.998 kg)  SpO2: 98%   Body mass index is 26.31 kg/(m^2).  Physical Exam  Constitutional: He is oriented to person, place, and time. He  appears well-developed and well-nourished. No distress.  Mildly overweight  HENT:  Head: Normocephalic and atraumatic.  Right Ear: External ear normal.  Left Ear: External ear normal.  Nose: Nose normal.  Some missing teeth.  Eyes: Conjunctivae and EOM are normal.  Abnormal pupils that are small and eccentric. Corrective lenses.  Neck: No JVD present. No tracheal deviation present. No thyromegaly present.  Cardiovascular: Normal rate, regular rhythm, normal heart sounds and intact distal pulses.  Exam reveals no gallop and no friction rub.   No murmur heard. Pulmonary/Chest: No respiratory distress. He has no wheezes. He has no rales.  Abdominal: He exhibits no distension and no mass. There is no tenderness. There is no rebound.  Genitourinary: Prostate normal and penis normal. Guaiac negative stool. No penile tenderness.  Musculoskeletal: Normal range of motion. He exhibits no edema or tenderness.  Lymphadenopathy:    He has no cervical adenopathy.  Neurological: He is alert and oriented to person, place, and time. He has normal reflexes. No cranial nerve deficit. Coordination normal.  Intact vibratory and monofilament sensations  Skin:  Multiple SK and common  nevi.  Psychiatric: He has a normal mood and affect. His behavior is normal. Judgment and thought content normal.     Labs reviewed: Lab on 08/05/2014  Component Date Value Ref Range Status  . Hgb A1c MFr Bld 08/05/2014 6.1* 4.8 - 5.6 % Final   Comment:          Pre-diabetes: 5.7 - 6.4          Diabetes: >6.4          Glycemic control for adults with diabetes: <7.0   . Est. average glucose Bld gHb Est-m* 08/05/2014 128   Final  . Glucose 08/05/2014 99  65 - 99 mg/dL Final  . BUN 08/05/2014 12  8 - 27 mg/dL Final  . Creatinine, Ser 08/05/2014 0.87  0.76 - 1.27 mg/dL Final  . GFR calc non Af Amer 08/05/2014 86  >59 mL/min/1.73 Final  . GFR calc Af Amer 08/05/2014 100  >59 mL/min/1.73 Final  . BUN/Creatinine Ratio  08/05/2014 14  10 - 22 Final  . Sodium 08/05/2014 141  134 - 144 mmol/L Final  . Potassium 08/05/2014 4.4  3.5 - 5.2 mmol/L Final  . Chloride 08/05/2014 101  97 - 108 mmol/L Final  . CO2 08/05/2014 25  18 - 29 mmol/L Final  . Calcium 08/05/2014 9.4  8.6 - 10.2 mg/dL Final  . Total Protein 08/05/2014 6.8  6.0 - 8.5 g/dL Final  . Albumin 08/05/2014 4.2  3.5 - 4.8 g/dL Final  . Globulin, Total 08/05/2014 2.6  1.5 - 4.5 g/dL Final  . Albumin/Globulin Ratio 08/05/2014 1.6  1.1 - 2.5 Final  . Total Bilirubin 08/05/2014 0.5  0.0 - 1.2 mg/dL Final  . Alkaline Phosphatase 08/05/2014 70  39 - 117 IU/L Final  . AST 08/05/2014 22  0 - 40 IU/L Final  . ALT 08/05/2014 26  0 - 44 IU/L Final  . Cholesterol, Total 08/05/2014 158  100 - 199 mg/dL Final                 **Please note reference interval change**  . Triglycerides 08/05/2014 146  0 - 149 mg/dL Final                 **Please note reference interval change**  . HDL 08/05/2014 38* >39 mg/dL Final   Comment: According to ATP-III Guidelines, HDL-C >59 mg/dL is considered a negative risk factor for CHD.   Marland Kitchen VLDL Cholesterol Cal 08/05/2014 29  5 - 40 mg/dL Final  . LDL Calculated 08/05/2014 91  0 - 99 mg/dL Final                 **Please note reference interval change**  . Chol/HDL Ratio 08/05/2014 4.2  0.0 - 5.0 ratio units Final   Comment:                                   T. Chol/HDL Ratio                                             Men  Women                               1/2 Avg.Risk  3.4    3.3  Avg.Risk  5.0    4.4                                2X Avg.Risk  9.6    7.1                                3X Avg.Risk 23.4   11.0   . Creatinine, Ur 08/05/2014 WILL FOLLOW   Preliminary  . Microalbum.,U,Random 08/05/2014 WILL FOLLOW   Preliminary  . MICROALB/CREAT RATIO 08/05/2014 WILL FOLLOW   Preliminary  . Vitamin B-12 08/05/2014 369  211 - 946 pg/mL Final     Assessment/Plan  1. Essential  hypertension - irbesartan-hydrochlorothiazide (AVALIDE) 150-12.5 MG per tablet; Take one tablet daily to control blood pressure  Dispense: 30 tablet; Refill: 11  2. Hyperglycemia controlled  3. Hyperlipidemia controlled  4. Dysphagia resolved  5. Midline low back pain without sciatica resolved  6. Glaucoma controlled

## 2014-08-08 LAB — LIPID PANEL
Chol/HDL Ratio: 4.2 ratio units (ref 0.0–5.0)
Cholesterol, Total: 158 mg/dL (ref 100–199)
HDL: 38 mg/dL — ABNORMAL LOW (ref >39)
LDL Calculated: 91 mg/dL (ref 0–99)
Triglycerides: 146 mg/dL (ref 0–149)
VLDL Cholesterol Cal: 29 mg/dL (ref 5–40)

## 2014-08-08 LAB — COMPREHENSIVE METABOLIC PANEL
ALT: 26 IU/L (ref 0–44)
AST: 22 IU/L (ref 0–40)
Albumin/Globulin Ratio: 1.6 (ref 1.1–2.5)
Albumin: 4.2 g/dL (ref 3.5–4.8)
Alkaline Phosphatase: 70 IU/L (ref 39–117)
BUN/Creatinine Ratio: 14 (ref 10–22)
BUN: 12 mg/dL (ref 8–27)
CO2: 25 mmol/L (ref 18–29)
Calcium: 9.4 mg/dL (ref 8.6–10.2)
Chloride: 101 mmol/L (ref 97–108)
Creatinine, Ser: 0.87 mg/dL (ref 0.76–1.27)
GFR calc Af Amer: 100 mL/min/{1.73_m2} (ref >59)
GFR calc non Af Amer: 86 mL/min/{1.73_m2} (ref >59)
Globulin, Total: 2.6 g/dL (ref 1.5–4.5)
Glucose: 99 mg/dL (ref 65–99)
Potassium: 4.4 mmol/L (ref 3.5–5.2)
Sodium: 141 mmol/L (ref 134–144)
Total Bilirubin: 0.5 mg/dL (ref 0.0–1.2)
Total Protein: 6.8 g/dL (ref 6.0–8.5)

## 2014-08-08 LAB — MICROALBUMIN / CREATININE URINE RATIO

## 2014-08-08 LAB — VITAMIN B12: Vitamin B-12: 369 pg/mL (ref 211–946)

## 2014-08-08 LAB — HEMOGLOBIN A1C
Est. average glucose Bld gHb Est-mCnc: 128 mg/dL
Hgb A1c MFr Bld: 6.1 % — ABNORMAL HIGH (ref 4.8–5.6)

## 2015-02-03 ENCOUNTER — Other Ambulatory Visit: Payer: Medicare Other

## 2015-02-03 DIAGNOSIS — R739 Hyperglycemia, unspecified: Secondary | ICD-10-CM

## 2015-02-03 DIAGNOSIS — I10 Essential (primary) hypertension: Secondary | ICD-10-CM

## 2015-02-04 LAB — BASIC METABOLIC PANEL
BUN/Creatinine Ratio: 17 (ref 10–22)
BUN: 15 mg/dL (ref 8–27)
CO2: 20 mmol/L (ref 18–29)
Calcium: 9.4 mg/dL (ref 8.6–10.2)
Chloride: 103 mmol/L (ref 97–108)
Creatinine, Ser: 0.9 mg/dL (ref 0.76–1.27)
GFR calc Af Amer: 98 mL/min/{1.73_m2} (ref >59)
GFR calc non Af Amer: 84 mL/min/{1.73_m2} (ref >59)
Glucose: 99 mg/dL (ref 65–99)
Potassium: 4 mmol/L (ref 3.5–5.2)
Sodium: 141 mmol/L (ref 134–144)

## 2015-02-05 ENCOUNTER — Encounter: Payer: Self-pay | Admitting: Internal Medicine

## 2015-02-05 ENCOUNTER — Ambulatory Visit (INDEPENDENT_AMBULATORY_CARE_PROVIDER_SITE_OTHER): Payer: Medicare Other | Admitting: Internal Medicine

## 2015-02-05 VITALS — BP 132/82 | HR 59 | Temp 97.6°F | Ht 72.0 in | Wt 198.8 lb

## 2015-02-05 DIAGNOSIS — I10 Essential (primary) hypertension: Secondary | ICD-10-CM

## 2015-02-05 DIAGNOSIS — R739 Hyperglycemia, unspecified: Secondary | ICD-10-CM | POA: Diagnosis not present

## 2015-02-05 DIAGNOSIS — E785 Hyperlipidemia, unspecified: Secondary | ICD-10-CM | POA: Diagnosis not present

## 2015-02-05 DIAGNOSIS — G629 Polyneuropathy, unspecified: Secondary | ICD-10-CM | POA: Insufficient documentation

## 2015-02-05 DIAGNOSIS — R1310 Dysphagia, unspecified: Secondary | ICD-10-CM

## 2015-02-05 NOTE — Progress Notes (Signed)
Patient ID: Kirk [name], male   DOB: 05/09/1941, 74 y.o.   MRN: [id number]    Facility  [facility]    Place of Service:   OFFICE    Allergies  Allergen Reactions  . Reserpine   . Sulfa Antibiotics     Chief Complaint  Patient presents with  . Medical Management of Chronic Issues    6 Month Follow up. Complains of Eating slow and trouble swallowing     HPI:  Dysphagia: chronic problem.  Essential hypertension: controlled  Hyperglycemia: 99 mg% on latest lab  Hyperlipidemia: no recent lab  Neuropathy: feet feel numb    Medications: Patient's Medications  New Prescriptions   No medications on file  Previous Medications   ACETAMINOPHEN (TYLENOL) 325 MG TABLET    Take 650 mg by mouth. One tablet once daily as needed for pain   BRIMONIDINE (ALPHAGAN P) 0.1 % SOLN    One drop twice daily in each eye.   CHOLECALCIFEROL (VITAMIN D) 1000 UNITS TABLET    Take 1,000 Units by mouth daily.   IRBESARTAN-HYDROCHLOROTHIAZIDE (AVALIDE) 150-12.5 MG PER TABLET    Take one tablet daily to control blood pressure   TIMOLOL (TIMOPTIC) 0.5 % OPHTHALMIC SOLUTION       ZOSTER VACCINE LIVE, PF, (ZOSTAVAX) 46803 UNT/0.65ML INJECTION    Inject 19,400 Units into the skin once.  Modified Medications   No medications on file  Discontinued Medications   No medications on file     Review of Systems  Constitutional: Negative for fever, chills, diaphoresis, activity change, appetite change, fatigue and unexpected weight change.  HENT: Negative for ear pain, hearing loss, mouth sores, nosebleeds, sinus pressure and sore throat.   Eyes: Positive for visual disturbance (hx glaucoma.).  Respiratory: Negative for cough, choking, shortness of breath, wheezing and stridor.   Cardiovascular: Negative for chest pain, palpitations and leg swelling.  Gastrointestinal: Negative for nausea, abdominal pain, diarrhea, constipation, blood in stool, abdominal distention and rectal pain.  Endocrine: Negative for cold  intolerance, heat intolerance, polydipsia, polyphagia and polyuria.  Genitourinary: Negative for dysuria, urgency, frequency, flank pain, decreased urine volume, enuresis and difficulty urinating.  Musculoskeletal: Negative for myalgias, back pain, joint swelling, arthralgias, gait problem and neck pain.  Skin: Negative for color change, pallor and rash.  Allergic/Immunologic: Negative for environmental allergies, food allergies and immunocompromised state.  Neurological: Positive for numbness (to vibration and monofilament tests) and headaches. Negative for dizziness, tremors, seizures, syncope, facial asymmetry, speech difficulty, weakness and light-headedness.  Hematological: Negative.   Psychiatric/Behavioral: Negative for suicidal ideas, hallucinations, behavioral problems, confusion, sleep disturbance, self-injury, dysphoric mood, decreased concentration and agitation. The patient is not nervous/anxious and is not hyperactive.     Filed Vitals:   02/05/15 1122  BP: 144/88  Pulse: 59  Temp: 97.6 F (36.4 C)  TempSrc: Oral  Height: 6' (1.829 m)  Weight: 198 lb 12.8 oz (90.175 kg)   Body mass index is 26.96 kg/(m^2).  Physical Exam  Constitutional: He is oriented to person, place, and time. He appears well-developed and well-nourished. No distress.  Mildly overweight  HENT:  Head: Normocephalic and atraumatic.  Right Ear: External ear normal.  Left Ear: External ear normal.  Nose: Nose normal.  Some missing teeth.  Eyes: Conjunctivae and EOM are normal.  Abnormal pupils that are small and eccentric. Corrective lenses.  Neck: No JVD present. No tracheal deviation present. No thyromegaly present.  Cardiovascular: Normal rate, regular rhythm, normal heart sounds and intact distal pulses.  Exam reveals no gallop and no friction rub.   No murmur heard. Pulmonary/Chest: No respiratory distress. He has no wheezes. He has no rales.  Abdominal: He exhibits no distension and no mass.  There is no tenderness. There is no rebound.  Genitourinary: Prostate normal and penis normal. Guaiac negative stool. No penile tenderness.  Musculoskeletal: Normal range of motion. He exhibits no edema or tenderness.  Lymphadenopathy:    He has no cervical adenopathy.  Neurological: He is alert and oriented to person, place, and time. He has normal reflexes. No cranial nerve deficit. Coordination normal.  Absent vibratory and monofilament sensations  Skin:  Multiple SK and common nevi.  Psychiatric: He has a normal mood and affect. His behavior is normal. Judgment and thought content normal.     Labs reviewed: Appointment on 02/03/2015  Component Date Value Ref Range Status  . Glucose 02/03/2015 99  65 - 99 mg/dL Final   Comment: Specimen received in contact with cells. No visible hemolysis present. However GLUC may be decreased and K increased. Clinical correlation indicated.   . BUN 02/03/2015 15  8 - 27 mg/dL Final  . Creatinine, Ser 02/03/2015 0.90  0.76 - 1.27 mg/dL Final  . GFR calc non Af Amer 02/03/2015 84  >59 mL/min/1.73 Final  . GFR calc Af Amer 02/03/2015 98  >59 mL/min/1.73 Final  . BUN/Creatinine Ratio 02/03/2015 17  10 - 22 Final  . Sodium 02/03/2015 141  134 - 144 mmol/L Final  . Potassium 02/03/2015 4.0  3.5 - 5.2 mmol/L Final  . Chloride 02/03/2015 103  97 - 108 mmol/L Final  . CO2 02/03/2015 20  18 - 29 mmol/L Final  . Calcium 02/03/2015 9.4  8.6 - 10.2 mg/dL Final     Assessment/Plan  1. Dysphagia Chronic disorder that he says is no worse. Denies coughing at meals.  2. Essential hypertension - Comprehensive metabolic panel; Future  3. Hyperglycemia - Comprehensive metabolic panel; Future  4. Hyperlipidemia - Lipid panel  5. Neuropathy stable

## 2015-07-28 ENCOUNTER — Other Ambulatory Visit: Payer: Self-pay | Admitting: Internal Medicine

## 2015-08-11 ENCOUNTER — Other Ambulatory Visit: Payer: Medicare Other

## 2015-08-13 ENCOUNTER — Encounter: Payer: Medicare Other | Admitting: Internal Medicine

## 2015-08-14 ENCOUNTER — Other Ambulatory Visit: Payer: Medicare Other

## 2015-08-15 ENCOUNTER — Other Ambulatory Visit: Payer: Medicare Other

## 2015-08-18 ENCOUNTER — Other Ambulatory Visit: Payer: Medicare Other

## 2015-08-18 DIAGNOSIS — I10 Essential (primary) hypertension: Secondary | ICD-10-CM

## 2015-08-18 DIAGNOSIS — E785 Hyperlipidemia, unspecified: Secondary | ICD-10-CM

## 2015-08-18 DIAGNOSIS — R739 Hyperglycemia, unspecified: Secondary | ICD-10-CM

## 2015-08-19 LAB — COMPREHENSIVE METABOLIC PANEL
ALT: 31 IU/L (ref 0–44)
AST: 22 IU/L (ref 0–40)
Albumin/Globulin Ratio: 1.4 (ref 1.1–2.5)
Albumin: 4 g/dL (ref 3.5–4.8)
Alkaline Phosphatase: 63 IU/L (ref 39–117)
BUN/Creatinine Ratio: 13 (ref 10–22)
BUN: 12 mg/dL (ref 8–27)
Bilirubin Total: 0.7 mg/dL (ref 0.0–1.2)
CO2: 25 mmol/L (ref 18–29)
Calcium: 9.6 mg/dL (ref 8.6–10.2)
Chloride: 101 mmol/L (ref 97–106)
Creatinine, Ser: 0.93 mg/dL (ref 0.76–1.27)
GFR calc Af Amer: 94 mL/min/{1.73_m2} (ref >59)
GFR calc non Af Amer: 81 mL/min/{1.73_m2} (ref >59)
Globulin, Total: 2.8 g/dL (ref 1.5–4.5)
Glucose: 102 mg/dL — ABNORMAL HIGH (ref 65–99)
Potassium: 4.6 mmol/L (ref 3.5–5.2)
Sodium: 139 mmol/L (ref 136–144)
Total Protein: 6.8 g/dL (ref 6.0–8.5)

## 2015-08-19 LAB — LIPID PANEL
Chol/HDL Ratio: 4.8 ratio units (ref 0.0–5.0)
Cholesterol, Total: 174 mg/dL (ref 100–199)
HDL: 36 mg/dL — ABNORMAL LOW (ref >39)
LDL Calculated: 99 mg/dL (ref 0–99)
Triglycerides: 193 mg/dL — ABNORMAL HIGH (ref 0–149)
VLDL Cholesterol Cal: 39 mg/dL (ref 5–40)

## 2015-08-20 ENCOUNTER — Ambulatory Visit (INDEPENDENT_AMBULATORY_CARE_PROVIDER_SITE_OTHER): Payer: Medicare Other | Admitting: Internal Medicine

## 2015-08-20 ENCOUNTER — Encounter: Payer: Self-pay | Admitting: Internal Medicine

## 2015-08-20 VITALS — BP 124/80 | HR 80 | Temp 97.5°F | Resp 14 | Ht 72.0 in | Wt 198.0 lb

## 2015-08-20 DIAGNOSIS — E785 Hyperlipidemia, unspecified: Secondary | ICD-10-CM | POA: Diagnosis not present

## 2015-08-20 DIAGNOSIS — K225 Diverticulum of esophagus, acquired: Secondary | ICD-10-CM

## 2015-08-20 DIAGNOSIS — R739 Hyperglycemia, unspecified: Secondary | ICD-10-CM | POA: Diagnosis not present

## 2015-08-20 DIAGNOSIS — Q828 Other specified congenital malformations of skin: Secondary | ICD-10-CM

## 2015-08-20 DIAGNOSIS — I452 Bifascicular block: Secondary | ICD-10-CM | POA: Diagnosis not present

## 2015-08-20 DIAGNOSIS — R1310 Dysphagia, unspecified: Secondary | ICD-10-CM | POA: Diagnosis not present

## 2015-08-20 DIAGNOSIS — I10 Essential (primary) hypertension: Secondary | ICD-10-CM | POA: Diagnosis not present

## 2015-08-20 DIAGNOSIS — N400 Enlarged prostate without lower urinary tract symptoms: Secondary | ICD-10-CM

## 2015-08-20 DIAGNOSIS — Q809 Congenital ichthyosis, unspecified: Secondary | ICD-10-CM

## 2015-08-20 NOTE — Progress Notes (Signed)
Patient ID: Kirk [name], male   DOB: Apr 22, 1941, 74 y.o.   MRN: [id number]    HISTORY AND PHYSICAL  Location:    [facility]   Place of Service:   OFFICE  Extended Emergency Contact Information Primary Emergency Contact: [name],Kirk Address: [address]          [place], [place] [address] [place] Phone: [phone] Relation: Spouse  Advanced Directive information    Chief Complaint  Patient presents with  . Annual Exam    Yearly check-up, discuss labs (copy printed), last EKG 2014.   . Medical Management of Chronic Issues    HTN  . Immunizations    Refused flu vaccine   . MMSE    27/30, passed clock drawing     HPI:  Essential hypertension - controlled  Hyperglycemia - controlled   Hyperlipidemia - controlled  BPH (benign prostatic hyperplasia) - stable  Xeroderma - some flaking and itching  Dysphagia - having more symptoms. Would like to see ENT again. Has previously seen Dr. Erik [name]. Choking mainly with liquids.  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    Past Medical History  Diagnosis Date  . Unspecified vitamin D deficiency   . Unspecified pruritic disorder   . Hyperglycemia   . Pain in limb   . Disturbance of skin sensation   . Nevus, non-neoplastic   . Urinary frequency   . Herpes zoster without mention of complication   . Cough   . Dysphagia, unspecified(787.20)   . Plantar fascial fibromatosis   . Other premature beats   . Hypertension   . Encounter for long-term (current) use of other medications   . Special screening for malignant neoplasm of prostate   . Diverticulum of esophagus, acquired   . Osteoarthrosis, unspecified whether generalized or localized, unspecified site   . 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 Unspecified glaucoma   . Lumbago   . Hyperlipidemia   . Zenker's diverticulum   . Diverticulosis of colon 2014      Past Surgical History  Procedure Laterality Date  . Cataract extraction bilateral w/ anterior vitrectomy  1977  . Hernia repair  1993  . Incisional hernia repair  2008  . Flexible sigmoidoscopy  1999  . Glaucoma surgery      congenital glaucoma  . Laryngoscopy      with stapling of zenkers diverticulum  . Colonoscopy  09/06/2013    Kirk [name]    Patient Care Team: Estill Dooms, MD as PCP - General (Internal Medicine) Calvert Cantor, MD as Consulting Physician (Ophthalmology) Lafayette Dragon, MD as Consulting Physician (Gastroenterology) Allyn Kenner, MD (Dermatology) Malka So, MD as Attending Physician (General Surgery)  Social History   Social History  . Marital Status: Married    Spouse Name: N/A  . Number of Children: N/A  . Years of Education: N/A   Occupational History  . Not on file.   Social History Main Topics  . Smoking status: Never Smoker   . Smokeless tobacco: Never Used  . Alcohol Use: No  . Drug Use: No  . Sexual Activity: Not on file   Other Topics Concern  . Not on file   Social History Narrative   Married   Lives at home with wife   No regular exercise, but does his own yard work and household chores.    reports that he has never smoked.  He has never used smokeless tobacco. He reports that he does not drink alcohol or use illicit drugs.  Family History  Problem Relation Age of Onset  . Hypertension Mother   . Dementia Mother   . Kidney disease Father   . CAD Father   . Colon cancer Neg Hx   . Throat cancer Neg Hx   . Diabetes Neg Hx   . Liver disease Neg Hx    Family Status  Relation Status Death Age  . Mother Deceased   . Father Deceased   . Brother Deceased     stillborn    Immunization History  Administered Date(s) Administered  . Pneumococcal Conjugate-13 01/12/2012  . Td 03/05/2011    Allergies  Allergen Reactions  . Reserpine   . Sulfa Antibiotics     Medications: Patient's Medications  New Prescriptions   No  medications on file  Previous Medications   ACETAMINOPHEN (TYLENOL) 325 MG TABLET    Take 650 mg by mouth. One tablet once daily as needed for pain   BRIMONIDINE (ALPHAGAN P) 0.1 % SOLN    One drop twice daily in each eye.   CHOLECALCIFEROL (VITAMIN D3) 2000 UNITS TABS    Take by mouth daily.   IRBESARTAN-HYDROCHLOROTHIAZIDE (AVALIDE) 150-12.5 MG TABLET    TAKE ONE TABLET DAILY TO CONTROL BLOOD PRESSURE   TIMOLOL (TIMOPTIC) 0.5 % OPHTHALMIC SOLUTION    Place 1 drop into both eyes daily.    ZOSTER VACCINE LIVE, PF, (ZOSTAVAX) 88502 UNT/0.65ML INJECTION    Inject 19,400 Units into the skin once.  Modified Medications   No medications on file  Discontinued Medications   CHOLECALCIFEROL (VITAMIN D) 1000 UNITS TABLET    Take 1,000 Units by mouth daily.    Review of Systems  Constitutional: Negative for fever, chills, diaphoresis, activity change, appetite change, fatigue and unexpected weight change.  HENT: Negative for ear pain, hearing loss, mouth sores, nosebleeds, sinus pressure and sore throat.   Eyes: Positive for visual disturbance (hx glaucoma.).  Respiratory: Negative for cough, choking, shortness of breath, wheezing and stridor.   Cardiovascular: Negative for chest pain, palpitations and leg swelling.  Gastrointestinal: Negative for nausea, abdominal pain, diarrhea, constipation, blood in stool, abdominal distention and rectal pain.       History of esophageal diverticulum and mild dysphagia.  Endocrine: Negative for cold intolerance, heat intolerance, polydipsia, polyphagia and polyuria.  Genitourinary: Negative for dysuria, urgency, frequency, flank pain, decreased urine volume, enuresis and difficulty urinating.       History of BPH  Musculoskeletal: Negative for myalgias, back pain, joint swelling, arthralgias, gait problem and neck pain.  Skin: Negative for color change, pallor and rash.  Allergic/Immunologic: Negative for environmental allergies, food allergies and  immunocompromised state.  Neurological: Positive for numbness (to vibration and monofilament tests) and headaches. Negative for dizziness, tremors, seizures, syncope, facial asymmetry, speech difficulty, weakness and light-headedness.  Hematological: Negative.   Psychiatric/Behavioral: Negative for suicidal ideas, hallucinations, behavioral problems, confusion, sleep disturbance, self-injury, dysphoric mood, decreased concentration and agitation. The patient is not nervous/anxious and is not hyperactive.     Filed Vitals:   08/20/15 1555  BP: 124/80  Pulse: 80  Temp: 97.5 F (36.4 C)  TempSrc: Oral  Resp: 14  Height: 6' (1.829 m)  Weight: 198 lb (89.812 kg)  SpO2: 95%   Body mass index is 26.85 kg/(m^2).  Physical Exam  Constitutional: He is oriented to person, place, and time. He appears well-developed and well-nourished. No distress.  Mildly  overweight  HENT:  Head: Normocephalic and atraumatic.  Right Ear: External ear normal.  Left Ear: External ear normal.  Nose: Nose normal.  Some missing teeth.  Eyes: Conjunctivae and EOM are normal.  Abnormal pupils that are small and eccentric. Corrective lenses.  Neck: No JVD present. No tracheal deviation present. No thyromegaly present.  Cardiovascular: Normal rate, regular rhythm, normal heart sounds and intact distal pulses.  Exam reveals no gallop and no friction rub.   No murmur heard. Pulmonary/Chest: No respiratory distress. He has no wheezes. He has no rales.  Abdominal: He exhibits no distension and no mass. There is no tenderness. There is no rebound.  Genitourinary: Prostate normal and penis normal. Guaiac negative stool. No penile tenderness.  Musculoskeletal: Normal range of motion. He exhibits no edema or tenderness.  Lymphadenopathy:    He has no cervical adenopathy.  Neurological: He is alert and oriented to person, place, and time. He has normal reflexes. No cranial nerve deficit. Coordination normal.  Absent  vibratory and monofilament sensations  Skin:  Multiple SK and common nevi.  Psychiatric: He has a normal mood and affect. His behavior is normal. Judgment and thought content normal.    Labs reviewed: Lab Summary Latest Ref Rng 08/18/2015 02/03/2015 08/05/2014 01/28/2014  Hemoglobin 13.0-17.0 g/dL (None) (None) (None) (None)  Hematocrit 39.0-52.0 % (None) (None) (None) (None)  White count - (None) (None) (None) (None)  Platelet count - (None) (None) (None) (None)  Sodium 136 - 144 mmol/L 139 141 141 137  Potassium 3.5 - 5.2 mmol/L 4.6 4.0 4.4 4.2  Calcium 8.6 - 10.2 mg/dL 9.6 9.4 9.4 9.7  Phosphorus - (None) (None) (None) (None)  Creatinine 0.76 - 1.27 mg/dL 0.93 0.90 0.87 0.94  AST 0 - 40 IU/L 22 (None) 22 23  Alk Phos 39 - 117 IU/L 63 (None) 70 70  Bilirubin 0.0 - 1.2 mg/dL 0.7 (None) 0.5 0.8  Glucose 65 - 99 mg/dL 102(H) 99 99 109(H)  Cholesterol - (None) (None) (None) (None)  HDL cholesterol >39 mg/dL 36(L) (None) 38(L) 40  Triglycerides 0 - 149 mg/dL 193(H) (None) 146 208(H)  LDL Direct - (None) (None) (None) (None)  LDL Calc 0 - 99 mg/dL 99 (None) 91 104(H)  Total protein - (None) (None) (None) (None)  Albumin 3.5 - 4.8 g/dL 4.0 (None) 4.2 4.3   Lab Results  Component Value Date   BUN 12 08/18/2015   Lab Results  Component Value Date   HGBA1C 6.1* 08/05/2014   No results found for: TSH, T3TOTAL, T4TOTAL, THYROIDAB  10/19/14 EKG: rate 77. NSR. LAFB and Incomplete RBBB     Assessment/Plan 1. Essential hypertension Continue current medication - EKG 12-Lead - Comprehensive metabolic panel; Future  2. Hyperglycemia Continue to monitor - Comprehensive metabolic panel; Future  3. Hyperlipidemia Continue to monitor - Lipid panel; Future  4. BPH (benign prostatic hyperplasia) Minimal problems with hesitation or frequency  5. Xeroderma Try [facility]eline intensive care with a low  6. Dysphagia - Ambulatory referral to ENT  7. Diverticulum of esophagus, acquired -  Ambulatory referral to ENT

## 2015-09-01 ENCOUNTER — Other Ambulatory Visit: Payer: Medicare Other

## 2015-09-08 ENCOUNTER — Other Ambulatory Visit: Payer: Self-pay | Admitting: Otolaryngology

## 2015-09-08 DIAGNOSIS — R1319 Other dysphagia: Secondary | ICD-10-CM

## 2015-09-12 ENCOUNTER — Ambulatory Visit
Admission: RE | Admit: 2015-09-12 | Discharge: 2015-09-12 | Disposition: A | Discharge status: Home / Self Care | Payer: PRIVATE HEALTH INSURANCE | Source: Ambulatory Visit | Attending: Otolaryngology | Admitting: Otolaryngology

## 2015-09-12 DIAGNOSIS — R1319 Other dysphagia: Secondary | ICD-10-CM

## 2015-10-07 ENCOUNTER — Ambulatory Visit (INDEPENDENT_AMBULATORY_CARE_PROVIDER_SITE_OTHER): Payer: PPO | Admitting: Internal Medicine

## 2015-10-07 ENCOUNTER — Encounter: Payer: Self-pay | Admitting: Internal Medicine

## 2015-10-07 VITALS — BP 130/80 | HR 78 | Temp 98.2°F | Resp 20 | Ht 72.0 in | Wt 193.4 lb

## 2015-10-07 DIAGNOSIS — J069 Acute upper respiratory infection, unspecified: Secondary | ICD-10-CM | POA: Diagnosis not present

## 2015-10-07 MED ORDER — HYDROCOD POLST-CPM POLST ER 10-8 MG/5ML PO SUER: ORAL | Status: DC

## 2015-10-07 NOTE — Progress Notes (Signed)
Patient ID: Kirk [name], male   DOB: 11/03/1940, 74 y.o.   MRN: [id number]    Facility  [facility]    Place of Service:   OFFICE    Allergies  Allergen Reactions  . Reserpine   . Sulfa Antibiotics     Chief Complaint  Patient presents with  . Acute Visit    cough, sneezing, runny nose, x1 week    HPI:  NO fever. Head congested. Denies ear discomfort. Has had a cough and is asking for Tussionex..  Dr. Erik Obey is to schedule procedure for his esophageal diverticulum.  Medications: Patient's Medications  New Prescriptions   No medications on file  Previous Medications   ACETAMINOPHEN (TYLENOL) 325 MG TABLET    Take 650 mg by mouth. One tablet once daily as needed for pain   BRIMONIDINE (ALPHAGAN P) 0.1 % SOLN    One drop twice daily in each eye.   CHOLECALCIFEROL (VITAMIN D3) 2000 UNITS TABS    Take by mouth daily.   IRBESARTAN-HYDROCHLOROTHIAZIDE (AVALIDE) 150-12.5 MG TABLET    TAKE ONE TABLET DAILY TO CONTROL BLOOD PRESSURE   TIMOLOL (TIMOPTIC) 0.5 % OPHTHALMIC SOLUTION    Place 1 drop into both eyes daily.    ZOSTER VACCINE LIVE, PF, (ZOSTAVAX) 81191 UNT/0.65ML INJECTION    Inject 19,400 Units into the skin once.  Modified Medications   No medications on file  Discontinued Medications   No medications on file    Review of Systems  Constitutional: Negative for fever, chills, diaphoresis, activity change, appetite change, fatigue and unexpected weight change.  HENT: Positive for rhinorrhea. Negative for ear pain, hearing loss, mouth sores, nosebleeds, sinus pressure and sore throat.   Eyes: Positive for visual disturbance (hx glaucoma.).  Respiratory: Positive for cough. Negative for choking, shortness of breath, wheezing and stridor.   Cardiovascular: Negative for chest pain, palpitations and leg swelling.  Gastrointestinal: Negative for nausea, abdominal pain, diarrhea, constipation, blood in stool, abdominal distention and rectal pain.       History of esophageal  diverticulum and mild dysphagia.  Endocrine: Negative for cold intolerance, heat intolerance, polydipsia, polyphagia and polyuria.  Genitourinary: Negative for dysuria, urgency, frequency, flank pain, decreased urine volume, enuresis and difficulty urinating.       History of BPH  Musculoskeletal: Negative for myalgias, back pain, joint swelling, arthralgias, gait problem and neck pain.  Skin: Negative for color change, pallor and rash.  Allergic/Immunologic: Negative for environmental allergies, food allergies and immunocompromised state.  Neurological: Positive for numbness (to vibration and monofilament tests) and headaches. Negative for dizziness, tremors, seizures, syncope, facial asymmetry, speech difficulty, weakness and light-headedness.  Hematological: Negative.   Psychiatric/Behavioral: Negative for suicidal ideas, hallucinations, behavioral problems, confusion, sleep disturbance, self-injury, dysphoric mood, decreased concentration and agitation. The patient is not nervous/anxious and is not hyperactive.     Filed Vitals:   10/07/15 1459  BP: 130/80  Pulse: 78  Temp: 98.2 F (36.8 C)  TempSrc: Oral  Resp: 20  Height: 6' (1.829 m)  Weight: 193 lb 6.4 oz (87.726 kg)  SpO2: 97%   Body mass index is 26.22 kg/(m^2). Filed Weights   10/07/15 1459  Weight: 193 lb 6.4 oz (87.726 kg)     Physical Exam  Constitutional: He is oriented to person, place, and time. He appears well-developed and well-nourished. No distress.  Mildly overweight  HENT:  Head: Normocephalic and atraumatic.  Right Ear: External ear normal.  Left Ear: External ear normal.  Nose: Nose normal.  Some  missing teeth.  Eyes: Conjunctivae and EOM are normal.  Abnormal pupils that are small and eccentric. Corrective lenses.  Neck: No JVD present. No tracheal deviation present. No thyromegaly present.  Cardiovascular: Normal rate, regular rhythm, normal heart sounds and intact distal pulses.  Exam reveals no  gallop and no friction rub.   No murmur heard. Pulmonary/Chest: No respiratory distress. He has no wheezes. He has no rales.  Abdominal: He exhibits no distension and no mass. There is no tenderness. There is no rebound.  Genitourinary: Prostate normal and penis normal. Guaiac negative stool. No penile tenderness.  Musculoskeletal: Normal range of motion. He exhibits no edema or tenderness.  Lymphadenopathy:    He has no cervical adenopathy.  Neurological: He is alert and oriented to person, place, and time. He has normal reflexes. No cranial nerve deficit. Coordination normal.  Absent vibratory and monofilament sensations  Skin:  Multiple SK and common nevi.  Psychiatric: He has a normal mood and affect. His behavior is normal. Judgment and thought content normal.    Labs reviewed: Lab Summary Latest Ref Rng 08/18/2015 02/03/2015 08/05/2014 01/28/2014  Hemoglobin 13.0-17.0 g/dL (None) (None) (None) (None)  Hematocrit 39.0-52.0 % (None) (None) (None) (None)  White count - (None) (None) (None) (None)  Platelet count - (None) (None) (None) (None)  Sodium 136 - 144 mmol/L 139 141 141 137  Potassium 3.5 - 5.2 mmol/L 4.6 4.0 4.4 4.2  Calcium 8.6 - 10.2 mg/dL 9.6 9.4 9.4 9.7  Phosphorus - (None) (None) (None) (None)  Creatinine 0.76 - 1.27 mg/dL 0.93 0.90 0.87 0.94  AST 0 - 40 IU/L 22 (None) 22 23  Alk Phos 39 - 117 IU/L 63 (None) 70 70  Bilirubin 0.0 - 1.2 mg/dL 0.7 (None) 0.5 0.8  Glucose 65 - 99 mg/dL 102(H) 99 99 109(H)  Cholesterol - (None) (None) (None) (None)  HDL cholesterol >39 mg/dL 36(L) (None) 38(L) 40  Triglycerides 0 - 149 mg/dL 193(H) (None) 146 208(H)  LDL Direct - (None) (None) (None) (None)  LDL Calc 0 - 99 mg/dL 99 (None) 91 104(H)  Total protein - (None) (None) (None) (None)  Albumin 3.5 - 4.8 g/dL 4.0 (None) 4.2 4.3   No results found for: TSH, T3TOTAL, T4TOTAL, THYROIDAB Lab Results  Component Value Date   BUN 12 08/18/2015   BUN 15 02/03/2015   BUN 12  08/05/2014   Lab Results  Component Value Date   HGBA1C 6.1* 08/05/2014   HGBA1C 6.3* 07/30/2013    Assessment/Plan  1. Acute upper respiratory infection Continue nasal saline and Afrin as needed - chlorpheniramine-HYDROcodone (TUSSIONEX PENNKINETIC ER) 10-8 MG/5ML SUER; 5 cc every 12 hours if needed for cough  Dispense: 140 mL; Refill: 0

## 2015-10-13 DIAGNOSIS — R1313 Dysphagia, pharyngeal phase: Secondary | ICD-10-CM | POA: Diagnosis not present

## 2015-10-13 DIAGNOSIS — K225 Diverticulum of esophagus, acquired: Secondary | ICD-10-CM | POA: Diagnosis not present

## 2015-10-15 ENCOUNTER — Other Ambulatory Visit: Payer: Self-pay | Admitting: Otolaryngology

## 2015-10-15 NOTE — H&P (Signed)
Kirk [name], Kirk [name] 75 y.o., male [id number]     Chief Complaint: Swallowing difficulty  HPI: 6 years status post endoscopic Zenker's diverticulotomy.  This was done for dysphagia.  He was symptomatically better afterwards.  A followup barium swallow did show something of a pocket and in fact with some temporary trapping of a 13 mm barium tablet.  He cannot well localize just where they are getting stuck.  He notes meats, letuce, and hash brown potatoes  have been a problem.  No actual pain.  No change in voice or breathing.  No apparent aspiration.  He does not think he is having active reflux symptoms.  Preoperative are turned visit.  He has a recurrent Zenker's esophageal diverticulum.  We are planning to once again  attempt an endoscopic diverticulectomy.  I discussed the procedure in detail including risks and complications.  Questions were answered and informed consent was obtained.  I discussed overnight stay, and advancement of diet and activity afterwards.  I gave him a prescription for hydrocodone liquid for pain relief.  No antibiotics required.  Return visit here one month postop.  PMH: Past Medical History  Diagnosis Date  . Unspecified vitamin D deficiency   . Unspecified pruritic disorder   . Hyperglycemia   . Pain in limb   . Disturbance of skin sensation   . Nevus, non-neoplastic   . Urinary frequency   . Herpes zoster without mention of complication   . Cough   . Dysphagia, unspecified(787.20)   . Plantar fascial fibromatosis   . Other premature beats   . Hypertension   . Encounter for long-term (current) use of other medications   . Special screening for malignant neoplasm of prostate   . Diverticulum of esophagus, acquired   . Osteoarthrosis, unspecified whether generalized or localized, unspecified site   . 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 Unspecified glaucoma   . Lumbago   . Hyperlipidemia   .  Zenker's diverticulum   . Diverticulosis of colon 2014    Surg Hx: Past Surgical History  Procedure Laterality Date  . Cataract extraction bilateral w/ anterior vitrectomy  1977  . Hernia repair  1993  . Incisional hernia repair  2008  . Flexible sigmoidoscopy  1999  . Glaucoma surgery      congenital glaucoma  . Laryngoscopy      with stapling of zenkers diverticulum  . Colonoscopy  09/06/2013    [name]    FHx:   Family History  Problem Relation Age of Onset  . Hypertension Mother   . Dementia Mother   . Kidney disease Father   . CAD Father   . Colon cancer Neg Hx   . Throat cancer Neg Hx   . Diabetes Neg Hx   . Liver disease Neg Hx    SocHx:  reports that he has never smoked. He has never used smokeless tobacco. He reports that he does not drink alcohol or use illicit drugs.  ALLERGIES:  Allergies  Allergen Reactions  . Reserpine Other (See Comments)    Unknown reaction  . Sulfa Antibiotics Other (See Comments)    Unknown reaction     (Not in a hospital admission)  No results found for this or any previous visit (from the past 48 hour(s)). No results found.  [id number]: Not feeling tired (fatigue).  No fever, no night sweats, and no recent weight loss. Head: No headache. Eyes: No eye symptoms. Otolaryngeal: No  hearing loss, no earache, no tinnitus, and no purulent nasal discharge.  No nasal passage blockage (stuffiness), no snoring, no sneezing, no hoarseness, and no sore throat. Cardiovascular: No chest pain or discomfort  and no palpitations. Pulmonary: No dyspnea, no cough, and no wheezing. Gastrointestinal: No dysphagia  and no heartburn.  No nausea, no abdominal pain, and no melena.  No diarrhea. Genitourinary: No dysuria. Endocrine: No muscle weakness. Musculoskeletal: No calf muscle cramps, no arthralgias, and no soft tissue swelling. Neurological: No dizziness, no fainting, no tingling, and no numbness. Psychological: No anxiety  and no  depression. Skin: No rash.  BP:134/76,  HR: 77 b/min,  BSA Calculated: 2.12 ,  BMI Calculated: 26.85 ,  Weight: 198 lb , BMI: 26.9 kg/m2,  Height: 6 ft.  PHYSICAL EXAM: He is tall and trim.  Mental status is sharp.  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  He does have significant halitosis.  Ears are clear.  Anterior nose is moist and patent.  Oral cavity is clear with teeth in fair to good repair.  Oropharynx clear.  Neck without adenopathy or masses.   Lungs: Clear to auscultation Heart: Regular rate and rhythm without murmurs Abdomen: Soft, active Extremities: Normal configuration Neurologic: Symmetric, grossly intact.  Studies Reviewed:  Barium swallow    Assessment/Plan Esophageal diverticulum, acquired (530.6) (K22.5). 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 GERD  We are planning to fix your esophageal diverticulum endoscopically next week.  you will stay overnight one night again.  I am leaving you a prescription for hydrocodone liquids, although plain liquid ibuprofen may be sufficient.  you can advance your diet gradually as comfortable.  Return visit here 10 days postop.  Hydrocodone-Acetaminophen 7.5-325 MG/15ML Oral Solution;10-20 ml po q4-6h prn pain; 0000000; R0; Rx.  Jodi Marble [id number], 9:55 PM

## 2015-10-16 ENCOUNTER — Encounter (HOSPITAL_COMMUNITY)
Admission: RE | Admit: 2015-10-16 | Discharge: 2015-10-16 | Disposition: A | Discharge status: Home / Self Care | Payer: PPO | Source: Ambulatory Visit | Attending: Otolaryngology | Admitting: Otolaryngology

## 2015-10-16 ENCOUNTER — Encounter (HOSPITAL_COMMUNITY): Payer: Self-pay

## 2015-10-16 DIAGNOSIS — Z01812 Encounter for preprocedural laboratory examination: Secondary | ICD-10-CM | POA: Insufficient documentation

## 2015-10-16 DIAGNOSIS — K225 Diverticulum of esophagus, acquired: Secondary | ICD-10-CM | POA: Insufficient documentation

## 2015-10-16 LAB — CBC
HCT: 46.8 % (ref 39.0–52.0)
Hemoglobin: 15.9 g/dL (ref 13.0–17.0)
MCH: 30.5 pg (ref 26.0–34.0)
MCHC: 34 g/dL (ref 30.0–36.0)
MCV: 89.7 fL (ref 78.0–100.0)
Platelets: 196 10*3/uL (ref 150–400)
RBC: 5.22 MIL/uL (ref 4.22–5.81)
RDW: 13.3 % (ref 11.5–15.5)
WBC: 7.7 10*3/uL (ref 4.0–10.5)

## 2015-10-16 LAB — BASIC METABOLIC PANEL
Anion gap: 12 (ref 5–15)
BUN: 13 mg/dL (ref 6–20)
CO2: 25 mmol/L (ref 22–32)
Calcium: 9.7 mg/dL (ref 8.9–10.3)
Chloride: 101 mmol/L (ref 101–111)
Creatinine, Ser: 0.9 mg/dL (ref 0.61–1.24)
GFR calc Af Amer: >60 mL/min (ref >60)
GFR calc non Af Amer: >60 mL/min (ref >60)
Glucose, Bld: 120 mg/dL — ABNORMAL HIGH (ref 65–99)
Potassium: 4 mmol/L (ref 3.5–5.1)
Sodium: 138 mmol/L (ref 135–145)

## 2015-10-16 NOTE — Progress Notes (Signed)
PCP - Dr. Jeanmarie Hubert Cardiologist - denies  EKG- 08/20/15 CXR - denies  Echo/stress test/cardiac cath - denies  Patient denies chest pain and shortness of breath at PAT appointment.

## 2015-10-16 NOTE — Pre-Procedure Instructions (Signed)
    Kirk [name]  10/16/2015      [name] [address] - [place], [place] - [place] [address] Phone: [phone] Fax: [phone]    Your procedure is scheduled on Wednesday,  January 18th, 2017.  Report to [facility] Admitting at 6:30 A.M.  Call this number if you have problems the morning of surgery:  [phone]   Remember:  Do not eat food or drink liquids after midnight.   Take these medicines the morning of surgery with A SIP OF WATER: Acetaminophen (Tylenol) if needed; Eye drops, Chlorpheniramine-hydrocodone (Tussionex pennkinetic) if needed.  Stop taking: Aspirin, NSAIDS, Aleve, Naproxen, Ibuprofen, Advil, Motrin, BC's, Goody's, fish oil, all herbal medications, and all vitamins.    Do not wear jewelry.  Do not wear lotions, powders, or colognes.  You may wear deodorant.  Men may shave face and neck.  Do not bring valuables to the hospital.  [facility] is not responsible for any belongings or valuables.  Contacts, dentures or bridgework may not be worn into surgery.  Leave your suitcase in the car.  After surgery it may be brought to your room.  For patients admitted to the hospital, discharge time will be determined by your treatment team.  Patients discharged the day of surgery will not be allowed to drive home.   Special instructions:  See attached.   Please read over the following fact sheets that you were given. Pain Booklet, Coughing and Deep Breathing and Surgical Site Infection Prevention

## 2015-10-21 NOTE — Anesthesia Preprocedure Evaluation (Addendum)
Anesthesia Evaluation  Patient identified by MRN, date of birth, ID band Patient awake    Reviewed: Allergy & Precautions, NPO status , Patient's Chart, lab work & pertinent test results  Airway Mallampati: I  TM Distance: >3 FB Neck ROM: Full    Dental  (+) Teeth Intact, Dental Advisory Given   Pulmonary neg pulmonary ROS,    breath sounds clear to auscultation       Cardiovascular hypertension, Pt. on medications + dysrhythmias  Rhythm:Regular Rate:Normal     Neuro/Psych negative neurological ROS  negative psych ROS   GI/Hepatic negative GI ROS, Neg liver ROS,   Endo/Other  negative endocrine ROS  Renal/GU negative Renal ROS  negative genitourinary   Musculoskeletal  (+) Arthritis , Osteoarthritis,    Abdominal   Peds negative pediatric ROS (+)  Hematology negative hematology ROS (+)   Anesthesia Other Findings   Reproductive/Obstetrics negative OB ROS                          Lab Results  Component Value Date   WBC 7.7 10/16/2015   HGB 15.9 10/16/2015   HCT 46.8 10/16/2015   MCV 89.7 10/16/2015   PLT 196 10/16/2015   Lab Results  Component Value Date   CREATININE 0.90 10/16/2015   BUN 13 10/16/2015   NA 138 10/16/2015   K 4.0 10/16/2015   CL 101 10/16/2015   CO2 25 10/16/2015   No results found for: INR, PROTIME  08/2015 EKG: normal sinus rhythm, RBBB, left fascicular block.   Anesthesia Physical Anesthesia Plan  ASA: III  Anesthesia Plan: General   Post-op Pain Management:    Induction: Intravenous  Airway Management Planned: Oral ETT  Additional Equipment:   Intra-op Plan:   Post-operative Plan: Extubation in OR  Informed Consent: I have reviewed the patients History and Physical, chart, labs and discussed the procedure including the risks, benefits and alternatives for the proposed anesthesia with the patient or authorized representative who has indicated  his/her understanding and acceptance.   Dental advisory given  Plan Discussed with: CRNA  Anesthesia Plan Comments:         Anesthesia Quick Evaluation

## 2015-10-22 ENCOUNTER — Ambulatory Visit (HOSPITAL_COMMUNITY): Payer: PPO | Admitting: Anesthesiology

## 2015-10-22 ENCOUNTER — Observation Stay (HOSPITAL_COMMUNITY)
Admission: RE | Admit: 2015-10-22 | Discharge: 2015-10-23 | Disposition: A | Discharge status: Home / Self Care | Payer: PPO | Source: Ambulatory Visit | Attending: Otolaryngology | Admitting: Otolaryngology

## 2015-10-22 ENCOUNTER — Encounter (HOSPITAL_COMMUNITY): Payer: Self-pay | Admitting: *Deleted

## 2015-10-22 ENCOUNTER — Encounter (HOSPITAL_COMMUNITY)
Admission: RE | Disposition: A | Discharge status: Home / Self Care | Payer: Self-pay | Source: Ambulatory Visit | Attending: Otolaryngology

## 2015-10-22 DIAGNOSIS — M1990 Unspecified osteoarthritis, unspecified site: Secondary | ICD-10-CM | POA: Diagnosis not present

## 2015-10-22 DIAGNOSIS — M545 Low back pain: Secondary | ICD-10-CM | POA: Diagnosis not present

## 2015-10-22 DIAGNOSIS — R1313 Dysphagia, pharyngeal phase: Secondary | ICD-10-CM | POA: Insufficient documentation

## 2015-10-22 DIAGNOSIS — K219 Gastro-esophageal reflux disease without esophagitis: Secondary | ICD-10-CM | POA: Insufficient documentation

## 2015-10-22 DIAGNOSIS — K225 Diverticulum of esophagus, acquired: Secondary | ICD-10-CM | POA: Diagnosis not present

## 2015-10-22 DIAGNOSIS — E785 Hyperlipidemia, unspecified: Secondary | ICD-10-CM | POA: Diagnosis not present

## 2015-10-22 DIAGNOSIS — I10 Essential (primary) hypertension: Secondary | ICD-10-CM | POA: Diagnosis not present

## 2015-10-22 HISTORY — DX: Congenital glaucoma: Q15.0

## 2015-10-22 HISTORY — PX: DIRECT LARYNGOSCOPY: SHX5326

## 2015-10-22 LAB — CBC
HCT: 44.4 % (ref 39.0–52.0)
Hemoglobin: 14.9 g/dL (ref 13.0–17.0)
MCH: 29.9 pg (ref 26.0–34.0)
MCHC: 33.6 g/dL (ref 30.0–36.0)
MCV: 89 fL (ref 78.0–100.0)
Platelets: 177 10*3/uL (ref 150–400)
RBC: 4.99 MIL/uL (ref 4.22–5.81)
RDW: 13.5 % (ref 11.5–15.5)
WBC: 10.5 10*3/uL (ref 4.0–10.5)

## 2015-10-22 LAB — CREATININE, SERUM
Creatinine, Ser: 1.03 mg/dL (ref 0.61–1.24)
GFR calc Af Amer: >60 mL/min (ref >60)
GFR calc non Af Amer: >60 mL/min (ref >60)

## 2015-10-22 SURGERY — ZENKER'S DIVERTICULECTOMY ENDOSCOPIC
Anesthesia: General | Site: Throat

## 2015-10-22 MED ORDER — FENTANYL CITRATE (PF) 100 MCG/2ML IJ SOLN
INTRAMUSCULAR | Status: AC
  Filled 2015-10-22: qty 2

## 2015-10-22 MED ORDER — CEFAZOLIN SODIUM-DEXTROSE 2-3 GM-% IV SOLR
INTRAVENOUS | Status: AC
  Filled 2015-10-22: qty 50

## 2015-10-22 MED ORDER — FENTANYL CITRATE (PF) 250 MCG/5ML IJ SOLN
INTRAMUSCULAR | Status: AC
  Filled 2015-10-22: qty 5

## 2015-10-22 MED ORDER — MIDAZOLAM HCL 2 MG/2ML IJ SOLN
INTRAMUSCULAR | Status: AC
  Filled 2015-10-22: qty 2

## 2015-10-22 MED ORDER — GLYCOPYRROLATE 0.2 MG/ML IJ SOLN
INTRAMUSCULAR | Status: AC
  Filled 2015-10-22: qty 1

## 2015-10-22 MED ORDER — PROPOFOL 10 MG/ML IV BOLUS
INTRAVENOUS | Status: AC
  Filled 2015-10-22: qty 20

## 2015-10-22 MED ORDER — PANTOPRAZOLE SODIUM 40 MG IV SOLR
40.0000 mg | Freq: Once | INTRAVENOUS | Status: AC | PRN
  Administered 2015-10-22: 40 mg via INTRAVENOUS
  Filled 2015-10-22 (×2): qty 40

## 2015-10-22 MED ORDER — SUCCINYLCHOLINE CHLORIDE 20 MG/ML IJ SOLN
INTRAMUSCULAR | Status: AC
  Filled 2015-10-22: qty 1

## 2015-10-22 MED ORDER — ROCURONIUM BROMIDE 50 MG/5ML IV SOLN
INTRAVENOUS | Status: AC
  Filled 2015-10-22: qty 1

## 2015-10-22 MED ORDER — EPHEDRINE SULFATE 50 MG/ML IJ SOLN
INTRAMUSCULAR | Status: AC
  Filled 2015-10-22: qty 1

## 2015-10-22 MED ORDER — IBUPROFEN 100 MG/5ML PO SUSP
400.0000 mg | Freq: Four times a day (QID) | ORAL | Status: DC | PRN
  Filled 2015-10-22: qty 20

## 2015-10-22 MED ORDER — DEXTROSE-NACL 5-0.45 % IV SOLN
INTRAVENOUS | Status: DC
  Administered 2015-10-23: 01:00:00 via INTRAVENOUS

## 2015-10-22 MED ORDER — PROPOFOL 10 MG/ML IV BOLUS
INTRAVENOUS | Status: DC | PRN
  Administered 2015-10-22: 10 mg via INTRAVENOUS
  Administered 2015-10-22: 20 mg via INTRAVENOUS
  Administered 2015-10-22: 100 mg via INTRAVENOUS

## 2015-10-22 MED ORDER — DEXAMETHASONE SODIUM PHOSPHATE 4 MG/ML IJ SOLN
INTRAMUSCULAR | Status: AC
  Filled 2015-10-22: qty 2

## 2015-10-22 MED ORDER — HYDROCODONE-ACETAMINOPHEN 7.5-325 MG/15ML PO SOLN
10.0000 mL | ORAL | Status: DC | PRN
  Administered 2015-10-22: 15 mL via ORAL
  Filled 2015-10-22: qty 15

## 2015-10-22 MED ORDER — CEFAZOLIN SODIUM-DEXTROSE 2-3 GM-% IV SOLR
2.0000 g | INTRAVENOUS | Status: AC
  Administered 2015-10-22: 2 g via INTRAVENOUS

## 2015-10-22 MED ORDER — SUGAMMADEX SODIUM 200 MG/2ML IV SOLN
INTRAVENOUS | Status: AC
  Filled 2015-10-22: qty 2

## 2015-10-22 MED ORDER — HYDROCHLOROTHIAZIDE 12.5 MG PO CAPS
12.5000 mg | ORAL_CAPSULE | Freq: Every day | ORAL | Status: DC
  Administered 2015-10-22 – 2015-10-23 (×2): 12.5 mg via ORAL
  Filled 2015-10-22 (×2): qty 1

## 2015-10-22 MED ORDER — ONDANSETRON HCL 4 MG/2ML IJ SOLN: 4.0000 mg | INTRAMUSCULAR | Status: DC | PRN

## 2015-10-22 MED ORDER — LACTATED RINGERS IV SOLN
INTRAVENOUS | Status: DC | PRN
  Administered 2015-10-22: 08:00:00 via INTRAVENOUS

## 2015-10-22 MED ORDER — DEXAMETHASONE SODIUM PHOSPHATE 4 MG/ML IJ SOLN
INTRAMUSCULAR | Status: DC | PRN
  Administered 2015-10-22: 8 mg via INTRAVENOUS

## 2015-10-22 MED ORDER — MEPERIDINE HCL 25 MG/ML IJ SOLN: 6.2500 mg | INTRAMUSCULAR | Status: DC | PRN

## 2015-10-22 MED ORDER — IRBESARTAN-HYDROCHLOROTHIAZIDE 150-12.5 MG PO TABS: 1.0000 | ORAL_TABLET | Freq: Every day | ORAL | Status: DC

## 2015-10-22 MED ORDER — FENTANYL CITRATE (PF) 100 MCG/2ML IJ SOLN
INTRAMUSCULAR | Status: DC | PRN
  Administered 2015-10-22: 50 ug via INTRAVENOUS
  Administered 2015-10-22: 100 ug via INTRAVENOUS
  Administered 2015-10-22: 50 ug via INTRAVENOUS

## 2015-10-22 MED ORDER — HEPARIN SODIUM (PORCINE) 5000 UNIT/ML IJ SOLN
5000.0000 [IU] | Freq: Three times a day (TID) | INTRAMUSCULAR | Status: DC
  Administered 2015-10-22 – 2015-10-23 (×2): 5000 [IU] via SUBCUTANEOUS
  Filled 2015-10-22: qty 1

## 2015-10-22 MED ORDER — ONDANSETRON HCL 4 MG PO TABS: 4.0000 mg | ORAL_TABLET | ORAL | Status: DC | PRN

## 2015-10-22 MED ORDER — LIDOCAINE-EPINEPHRINE (PF) 1 %-1:200000 IJ SOLN
INTRAMUSCULAR | Status: AC
  Filled 2015-10-22: qty 30

## 2015-10-22 MED ORDER — LIDOCAINE HCL (CARDIAC) 20 MG/ML IV SOLN
INTRAVENOUS | Status: AC
  Filled 2015-10-22: qty 5

## 2015-10-22 MED ORDER — BRIMONIDINE TARTRATE 0.15 % OP SOLN
1.0000 [drp] | Freq: Two times a day (BID) | OPHTHALMIC | Status: DC
  Administered 2015-10-22 – 2015-10-23 (×2): 1 [drp] via OPHTHALMIC
  Filled 2015-10-22: qty 5

## 2015-10-22 MED ORDER — SUGAMMADEX SODIUM 200 MG/2ML IV SOLN
INTRAVENOUS | Status: DC | PRN
  Administered 2015-10-22: 200 mg via INTRAVENOUS

## 2015-10-22 MED ORDER — LACTATED RINGERS IV SOLN: INTRAVENOUS | Status: DC

## 2015-10-22 MED ORDER — TIMOLOL MALEATE 0.5 % OP SOLN
1.0000 [drp] | Freq: Every day | OPHTHALMIC | Status: DC
  Administered 2015-10-23: 1 [drp] via OPHTHALMIC

## 2015-10-22 MED ORDER — ROCURONIUM BROMIDE 100 MG/10ML IV SOLN
INTRAVENOUS | Status: DC | PRN
  Administered 2015-10-22: 40 mg via INTRAVENOUS

## 2015-10-22 MED ORDER — MORPHINE SULFATE (PF) 2 MG/ML IV SOLN: 1.0000 mg | INTRAVENOUS | Status: DC | PRN

## 2015-10-22 MED ORDER — PROMETHAZINE HCL 25 MG/ML IJ SOLN: 6.2500 mg | INTRAMUSCULAR | Status: DC | PRN

## 2015-10-22 MED ORDER — ONDANSETRON HCL 4 MG/2ML IJ SOLN
INTRAMUSCULAR | Status: DC | PRN
  Administered 2015-10-22: 4 mg via INTRAVENOUS

## 2015-10-22 MED ORDER — IRBESARTAN 150 MG PO TABS
150.0000 mg | ORAL_TABLET | Freq: Every day | ORAL | Status: DC
  Administered 2015-10-22 – 2015-10-23 (×2): 150 mg via ORAL
  Filled 2015-10-22 (×3): qty 1

## 2015-10-22 MED ORDER — PHENYLEPHRINE 40 MCG/ML (10ML) SYRINGE FOR IV PUSH (FOR BLOOD PRESSURE SUPPORT)
PREFILLED_SYRINGE | INTRAVENOUS | Status: AC
  Filled 2015-10-22: qty 10

## 2015-10-22 MED ORDER — LIDOCAINE HCL (CARDIAC) 20 MG/ML IV SOLN
INTRAVENOUS | Status: DC | PRN
  Administered 2015-10-22: 100 mg via INTRAVENOUS

## 2015-10-22 MED ORDER — FENTANYL CITRATE (PF) 100 MCG/2ML IJ SOLN
25.0000 ug | INTRAMUSCULAR | Status: DC | PRN
  Administered 2015-10-22 (×3): 50 ug via INTRAVENOUS

## 2015-10-22 MED ORDER — 0.9 % SODIUM CHLORIDE (POUR BTL) OPTIME
TOPICAL | Status: DC | PRN
  Administered 2015-10-22: 1000 mL

## 2015-10-22 MED ORDER — CHLORHEXIDINE GLUCONATE 0.12 % MT SOLN
5.0000 mL | Freq: Four times a day (QID) | OROMUCOSAL | Status: DC
  Administered 2015-10-22: 5 mL via OROMUCOSAL
  Filled 2015-10-22: qty 15

## 2015-10-22 MED ORDER — SODIUM CHLORIDE 0.9 % IJ SOLN
INTRAMUSCULAR | Status: AC
  Filled 2015-10-22: qty 10

## 2015-10-22 SURGICAL SUPPLY — 45 items
BLADE SCALPEL SHAW  SZ15 (BLADE) IMPLANT
BLADE SURG 15 STRL LF DISP TIS (BLADE) IMPLANT
BLADE SURG 15 STRL SS (BLADE)
CANISTER SUCTION 2500CC (MISCELLANEOUS) ×3 IMPLANT
CATH ROBINSON RED A/P 18FR (CATHETERS) IMPLANT
CLEANER TIP ELECTROSURG 2X2 (MISCELLANEOUS) IMPLANT
COVER SURGICAL LIGHT HANDLE (MISCELLANEOUS) ×3 IMPLANT
CRADLE DONUT ADULT HEAD (MISCELLANEOUS) IMPLANT
CUTTER LINEAR ENDO 35 ETS (STAPLE) ×3 IMPLANT
DRAPE PROXIMA HALF (DRAPES) ×3 IMPLANT
ELECT COATED BLADE 2.86 ST (ELECTRODE) IMPLANT
ELECT REM PT RETURN 9FT ADLT (ELECTROSURGICAL) ×3
ELECTRODE REM PT RTRN 9FT ADLT (ELECTROSURGICAL) ×1 IMPLANT
GAUZE PACKING FOLDED 1/2 STR (GAUZE/BANDAGES/DRESSINGS) IMPLANT
GLOVE BIOGEL PI IND STRL 7.0 (GLOVE) ×2 IMPLANT
GLOVE BIOGEL PI INDICATOR 7.0 (GLOVE) ×4
GLOVE ECLIPSE 8.0 STRL XLNG CF (GLOVE) ×3 IMPLANT
GLOVE SURG SS PI 7.0 STRL IVOR (GLOVE) ×3 IMPLANT
GOWN STRL REUS W/ TWL LRG LVL3 (GOWN DISPOSABLE) ×2 IMPLANT
GOWN STRL REUS W/TWL LRG LVL3 (GOWN DISPOSABLE) ×4
GUARD TEETH (MISCELLANEOUS) ×3 IMPLANT
KIT BASIN OR (CUSTOM PROCEDURE TRAY) ×3 IMPLANT
KIT ROOM TURNOVER OR (KITS) ×3 IMPLANT
NEEDLE HYPO 25GX1X1/2 BEV (NEEDLE) IMPLANT
NS IRRIG 1000ML POUR BTL (IV SOLUTION) ×3 IMPLANT
PAD ARMBOARD 7.5X6 YLW CONV (MISCELLANEOUS) ×3 IMPLANT
PATTIES SURGICAL 1X1 (DISPOSABLE) IMPLANT
PENCIL BUTTON HOLSTER BLD 10FT (ELECTRODE) IMPLANT
RELOAD CUTTER ETS 35MM STAND (ENDOMECHANICALS) IMPLANT
SOLUTION ANTI FOG 6CC (MISCELLANEOUS) ×3 IMPLANT
SPECIMEN JAR SMALL (MISCELLANEOUS) IMPLANT
SPONGE INTESTINAL PEANUT (DISPOSABLE) IMPLANT
STAPLER PROXIMATE FIRING4 30MM (STAPLE) IMPLANT
SURGILUBE 2OZ TUBE FLIPTOP (MISCELLANEOUS) IMPLANT
SUT CHROMIC 3 0 SH 27 (SUTURE) IMPLANT
SUT CHROMIC 4 0 P 3 18 (SUTURE) IMPLANT
SUT CHROMIC 4 0 TIES (SUTURE) IMPLANT
SUT ETHILON 5 0 PS 2 18 (SUTURE) IMPLANT
SUT SILK 3 0 TIES 10X30 (SUTURE) IMPLANT
SUT SILK 4 0 (SUTURE)
SUT SILK 4-0 18XBRD TIE 12 (SUTURE) IMPLANT
TOWEL OR 17X24 6PK STRL BLUE (TOWEL DISPOSABLE) IMPLANT
TRAY ENT MC OR (CUSTOM PROCEDURE TRAY) ×3 IMPLANT
TUBE CONNECTING 12'X1/4 (SUCTIONS) ×1
TUBE CONNECTING 12X1/4 (SUCTIONS) ×2 IMPLANT

## 2015-10-22 NOTE — Transfer of Care (Signed)
Immediate Anesthesia Transfer of Care Note  Patient: Kirk [name]  Procedure(s) Performed: Procedure(s): ZENKER'S DIVERTICULECTOMY ENDOSCOPIC (N/A)  Patient Location: PACU  Anesthesia Type:General  Level of Consciousness: oriented and patient cooperative  Airway & Oxygen Therapy: Patient Spontanous Breathing and Patient connected to nasal cannula oxygen  Post-op Assessment: Report given to RN, Post -op Vital signs reviewed and stable and Patient moving all extremities X 4  Post vital signs: Reviewed and stable  Last Vitals:  Filed Vitals:   10/22/15 0701  BP: 165/91  Pulse: 74  Temp: 36.2 C  Resp: 20    Complications: No apparent anesthesia complications

## 2015-10-22 NOTE — Progress Notes (Signed)
10/22/2015 6:34 PM  Jene Every [id number]  Post-Op Check   Temp:  [97.1 F (36.2 C)-98.3 F (36.8 C)] 97.7 F (36.5 C) (01/18 1530) Pulse Rate:  [63-77] 77 (01/18 1530) Resp:  [10-24] 20 (01/18 1530) BP: (130-165)/(66-91) 146/75 mmHg (01/18 1530) SpO2:  [95 %-100 %] 98 % (01/18 1530) Weight:  [88.451 kg (195 lb)-92.534 kg (204 lb)] 92.534 kg (204 lb) (01/18 1530),     Intake/Output Summary (Last 24 hours) at 10/22/15 1834 Last data filed at 10/22/15 1135  Gross per 24 hour  Intake    600 ml  Output     30 ml  Net    570 ml    Results for orders placed or performed during the hospital encounter of 10/22/15 (from the past 24 hour(s))  CBC     Status: None   Collection Time: 10/22/15  4:50 PM  Result Value Ref Range   WBC 10.5 4.0 - 10.5 K/uL   RBC 4.99 4.22 - 5.81 MIL/uL   Hemoglobin 14.9 13.0 - 17.0 g/dL   HCT 44.4 39.0 - 52.0 %   MCV 89.0 78.0 - 100.0 fL   MCH 29.9 26.0 - 34.0 pg   MCHC 33.6 30.0 - 36.0 g/dL   RDW 13.5 11.5 - 15.5 %   Platelets 177 150 - 400 K/uL  Creatinine, serum     Status: None   Collection Time: 10/22/15  4:50 PM  Result Value Ref Range   Creatinine, Ser 1.03 0.61 - 1.24 mg/dL   GFR calc non Af Amer >60 >60 mL/min   GFR calc Af Amer >60 >60 mL/min    SUBJECTIVE:  Min throat pain.  Breathing well.  Has not stood up out of bed yet, nor voided since surgery  OBJECTIVE:   Voice strong and clear  IMPRESSION:  Satisfactory check.  Needs to stand, and to void  PLAN:  Stand to void, bladder scan and/or I/O cath if needed  Jodi Marble

## 2015-10-22 NOTE — H&P (View-Only) (Signed)
Kirk [name], [name] 75 y.o., male [id number]     Chief Complaint: Swallowing difficulty  HPI: 6 years status post endoscopic Zenker's diverticulotomy.  This was done for dysphagia.  He was symptomatically better afterwards.  A followup barium swallow did show something of a pocket and in fact with some temporary trapping of a 13 mm barium tablet.  He cannot well localize just where they are getting stuck.  He notes meats, letuce, and hash brown potatoes  have been a problem.  No actual pain.  No change in voice or breathing.  No apparent aspiration.  He does not think he is having active reflux symptoms.  Preoperative are turned visit.  He has a recurrent Zenker's esophageal diverticulum.  We are planning to once again  attempt an endoscopic diverticulectomy.  I discussed the procedure in detail including risks and complications.  Questions were answered and informed consent was obtained.  I discussed overnight stay, and advancement of diet and activity afterwards.  I gave him a prescription for hydrocodone liquid for pain relief.  No antibiotics required.  Return visit here one month postop.  PMH: Past Medical History  Diagnosis Date  . Unspecified vitamin D deficiency   . Unspecified pruritic disorder   . Hyperglycemia   . Pain in limb   . Disturbance of skin sensation   . Nevus, non-neoplastic   . Urinary frequency   . Herpes zoster without mention of complication   . Cough   . Dysphagia, unspecified(787.20)   . Plantar fascial fibromatosis   . Other premature beats   . Hypertension   . Encounter for long-term (current) use of other medications   . Special screening for malignant neoplasm of prostate   . Diverticulum of esophagus, acquired   . Osteoarthrosis, unspecified whether generalized or localized, unspecified site   . 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 Unspecified glaucoma   . Lumbago   . Hyperlipidemia   .  Zenker's diverticulum   . Diverticulosis of colon 2014    Surg Hx: Past Surgical History  Procedure Laterality Date  . Cataract extraction bilateral w/ anterior vitrectomy  1977  . Hernia repair  1993  . Incisional hernia repair  2008  . Flexible sigmoidoscopy  1999  . Glaucoma surgery      congenital glaucoma  . Laryngoscopy      with stapling of zenkers diverticulum  . Colonoscopy  09/06/2013    [name]    FHx:   Family History  Problem Relation Age of Onset  . Hypertension Mother   . Dementia Mother   . Kidney disease Father   . CAD Father   . Colon cancer Neg Hx   . Throat cancer Neg Hx   . Diabetes Neg Hx   . Liver disease Neg Hx    SocHx:  reports that he has never smoked. He has never used smokeless tobacco. He reports that he does not drink alcohol or use illicit drugs.  ALLERGIES:  Allergies  Allergen Reactions  . Reserpine Other (See Comments)    Unknown reaction  . Sulfa Antibiotics Other (See Comments)    Unknown reaction     (Not in a hospital admission)  No results found for this or any previous visit (from the past 48 hour(s)). No results found.  [id number]: Not feeling tired (fatigue).  No fever, no night sweats, and no recent weight loss. Head: No headache. Eyes: No eye symptoms. Otolaryngeal: No  hearing loss, no earache, no tinnitus, and no purulent nasal discharge.  No nasal passage blockage (stuffiness), no snoring, no sneezing, no hoarseness, and no sore throat. Cardiovascular: No chest pain or discomfort  and no palpitations. Pulmonary: No dyspnea, no cough, and no wheezing. Gastrointestinal: No dysphagia  and no heartburn.  No nausea, no abdominal pain, and no melena.  No diarrhea. Genitourinary: No dysuria. Endocrine: No muscle weakness. Musculoskeletal: No calf muscle cramps, no arthralgias, and no soft tissue swelling. Neurological: No dizziness, no fainting, no tingling, and no numbness. Psychological: No anxiety  and no  depression. Skin: No rash.  BP:134/76,  HR: 77 b/min,  BSA Calculated: 2.12 ,  BMI Calculated: 26.85 ,  Weight: 198 lb , BMI: 26.9 kg/m2,  Height: 6 ft.  PHYSICAL EXAM: He is tall and trim.  Mental status is sharp.  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  He does have significant halitosis.  Ears are clear.  Anterior nose is moist and patent.  Oral cavity is clear with teeth in fair to good repair.  Oropharynx clear.  Neck without adenopathy or masses.   Lungs: Clear to auscultation Heart: Regular rate and rhythm without murmurs Abdomen: Soft, active Extremities: Normal configuration Neurologic: Symmetric, grossly intact.  Studies Reviewed:  Barium swallow    Assessment/Plan Esophageal diverticulum, acquired (530.6) (K22.5). 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 GERD  We are planning to fix your esophageal diverticulum endoscopically next week.  you will stay overnight one night again.  I am leaving you a prescription for hydrocodone liquids, although plain liquid ibuprofen may be sufficient.  you can advance your diet gradually as comfortable.  Return visit here 10 days postop.  Hydrocodone-Acetaminophen 7.5-325 MG/15ML Oral Solution;10-20 ml po q4-6h prn pain; 0000000; R0; Rx.  Jodi Marble [id number], 9:55 PM

## 2015-10-22 NOTE — Discharge Instructions (Signed)
Advance diet slowly as comfortable. Activity as desired OK to shower. Recheck my office 3 weeks.  [phone] for an appointment

## 2015-10-22 NOTE — Interval H&P Note (Signed)
History and Physical Interval Note:  10/22/2015 8:48 AM  Kirk [name]  has presented today for surgery, with the diagnosis of recurrent Zenker's Esophageal Diverticulum  The various methods of treatment have been discussed with the patient and family. After consideration of risks, benefits and other options for treatment, the patient has consented to  Procedure(s): ZENKER'S DIVERTICULECTOMY ENDOSCOPIC (N/A) as a surgical intervention .  The patient's history has been re-reviewed, patient re-examined, no change in status, stable for surgery.  I have re-reviewed the patient's chart and labs.  Questions were answered to the patient's satisfaction.     Jodi Marble

## 2015-10-22 NOTE — Care Management Note (Signed)
Case Management Note  Patient Details  Name: Kirk [name] MRN: [id number] Date of Birth: 1941/05/22  Subjective/Objective:                    Action/Plan:  Consult : Assist with insurance coverage for overnight stay please. Unable to assist with insurance coverage . Billing will file with patient's insurance . Patient and wife aware of same .  Patient has different insurance then he did the last time he stayed overnight in hospital . Medicare obs letter given  Expected Discharge Date:                  Expected Discharge Plan:  Home/Self Care  In-House Referral:     Discharge planning Services     Post Acute Care Choice:    Choice offered to:     DME Arranged:    DME Agency:     HH Arranged:    [place] Agency:     Status of Service:  In process, will continue to follow  Medicare Important Message Given:    Date Medicare IM Given:    Medicare IM give by:    Date Additional Medicare IM Given:    Additional Medicare Important Message give by:     If discussed at [place] of Stay Meetings, dates discussed:    Additional Comments:  Marilu Favre, RN 10/22/2015, 4:25 PM

## 2015-10-22 NOTE — Anesthesia Procedure Notes (Signed)
Procedure Name: Intubation Date/Time: 10/22/2015 10:37 AM Performed by: Garrison Columbus T Pre-anesthesia Checklist: Patient identified, Emergency Drugs available, Suction available and Patient being monitored Patient Re-evaluated:Patient Re-evaluated prior to inductionOxygen Delivery Method: Circle system utilized Preoxygenation: Pre-oxygenation with 100% oxygen Intubation Type: IV induction Ventilation: Mask ventilation without difficulty and Oral airway inserted - appropriate to patient size Laryngoscope Size: Sabra Heck and 2 Grade View: Grade I Tube type: Oral Tube size: 7.5 mm Number of attempts: 1 Airway Equipment and Method: Stylet and Oral airway Placement Confirmation: ETT inserted through vocal cords under direct vision,  positive ETCO2 and breath sounds checked- equal and bilateral Secured at: 23 cm Tube secured with: Tape Dental Injury: Teeth and Oropharynx as per pre-operative assessment

## 2015-10-22 NOTE — Care Management Obs Status (Signed)
[facility] NOTIFICATION   Patient Details  Name: Kirk [name] MRN: [id number] Date of Birth: 09-Feb-1941   Medicare Observation Status Notification Given:  Yes    Marilu Favre, RN 10/22/2015, 4:24 PM

## 2015-10-22 NOTE — Op Note (Signed)
10/22/2015  11:50 AM    Kirk [name]  [id number]   Pre-Op Dx:  Recurrent Zenker's esophageal diverticulum  Post-op Dx: same  Proc: Direct Laryngoscopy. Cervical esophagoscopy.  Endoscopic esophageal diverticulotomy.   Surg:  Jodi Marble T MD  Anes:  GOT  EBL:  none  Comp:  none  Findings:  Narrow mandible and maxilla.  Broad but fairly shallow diverticulum pocket with food debris contained therein.  Thin party wall.  Nl esophagoscopy  Procedure: With the patient in a comfortable supine position, general orotracheal anesthesia was induced without difficulty. At an appropriate level, the table was turned 90 the patient placed in a slight reverse Trendelenburg. Head was supported in the standard fashion.  A standard surgical timeout was obtained. Barium swallow was reviewed.  A rubber tooth guard was placed on the superior alveolus. The direct laryngoscope was introduced and inspection of the hypopharynx and larynx was performed. This was removed.  The cervical esophagoscope was lubricated and passed gently into the pharynx and with anterior pressure behind the larynx, the esophageal lumen was identified and the scope entered it quite readily. The scope was advanced to its full length with no significant findings. Upon removal, the diverticulum pocket was noted to have significant food debris which was evacuated.  A thin common party wall was noted.  The Weerda esophagoscope was lubricated and passed into the pharynx. It was difficult to get a good angle working around endotracheal tube, the tooth guard, and his narrow mandible and maxilla. I did not feel like I had adequate exposure of both the esophageal lumen and the diverticulum lumen.  The Weerda esophagoscope was removed. Cervical esophagoscope was introduced once again and under direct vision, an 78 [place] dilator was placed into the esophagus.  The esophagoscope was removed with the dilator in place.  The Weerda  esophagoscope was reintroduced over the [facility] dilator. With gentle manipulation, the Kathrene Bongo was positioned satisfactorily in the esophageal lumen in the diverticulum lumen were noted at the same time. The 30 telescope was placed and visualization confirmed the findings. Straight endoscopic stapler was introduced with the short jaw facing the diverticulum and the long jaw down the esophagus. This was seated as deeply as possible. Endoscopic view ascertained good positioning.  the stapler was clamped and observed again.  Finally, the stapler was fired. A clean cut was noted. There did not seem to be sufficient depth to the diverticulum to attempt a second pass. The area was suctioned of a small amount of blood and a few loose staples. The cervical esophagoscope was once again successfully introduce more easily past the narrow area. This was removed. The Weerda  esophagoscope was un- suspended and removed. The [facility] dilator was removed. Hemostasis was observed.  The rubber tooth guard had been difficult to keep in place during the manipulations. There was very slight chipping at the free edges of the upper incisors. No loose, fractured , or missing teeth.  The Yankauer suction was used to clear the pharynx.  At this point the procedure was completed. The patient was returned to anesthesia, awakened, extubated, and transferred to recovery in stable condition.   Dispo:   PACU to admit 23 hr overnight observation  Plan:  Advance diet slowly.  Repeat ba swallow later.  Recheck my office 3 weeks  Tyson Alias MD

## 2015-10-23 DIAGNOSIS — K225 Diverticulum of esophagus, acquired: Secondary | ICD-10-CM | POA: Diagnosis not present

## 2015-10-23 NOTE — Discharge Summary (Signed)
  10/23/2015 8:08 AM  Jene Every [id number]  Post-Op Day 1 DIscharge note    Temp:  [97.7 F (36.5 C)-98.3 F (36.8 C)] 98.1 F (36.7 C) (01/19 0519) Pulse Rate:  [62-79] 62 (01/19 0519) Resp:  [10-24] 17 (01/19 0519) BP: (130-147)/(66-84) 144/71 mmHg (01/19 0519) SpO2:  [95 %-100 %] 98 % (01/19 0519) Weight:  [92.534 kg (204 lb)] 92.534 kg (204 lb) (01/18 1530),     Intake/Output Summary (Last 24 hours) at 10/23/15 0808 Last data filed at 10/23/15 0500  Gross per 24 hour  Intake 2161.67 ml  Output   1830 ml  Net 331.67 ml    Results for orders placed or performed during the hospital encounter of 10/22/15 (from the past 24 hour(s))  CBC     Status: None   Collection Time: 10/22/15  4:50 PM  Result Value Ref Range   WBC 10.5 4.0 - 10.5 K/uL   RBC 4.99 4.22 - 5.81 MIL/uL   Hemoglobin 14.9 13.0 - 17.0 g/dL   HCT 44.4 39.0 - 52.0 %   MCV 89.0 78.0 - 100.0 fL   MCH 29.9 26.0 - 34.0 pg   MCHC 33.6 30.0 - 36.0 g/dL   RDW 13.5 11.5 - 15.5 %   Platelets 177 150 - 400 K/uL  Creatinine, serum     Status: None   Collection Time: 10/22/15  4:50 PM  Result Value Ref Range   Creatinine, Ser 1.03 0.61 - 1.24 mg/dL   GFR calc non Af Amer >60 >60 mL/min   GFR calc Af Amer >60 >60 mL/min    SUBJECTIVE:  Min throat pain.  Voiding well.  Voice and breathing good.  Taking full liquids easily  OBJECTIVE:  Voice clear. Breathing well.  Neck flat and non tender  IMPRESSION:  Satisfactory check  PLAN:   Discharge to home and care of family  Admit:  72 JAN Discharge:  19 JAN Final Diagnosis:  Zenker's esophageal diverticulum, recurrent Proc:  DL, Esophagoscopy, endoscopic diverticulotomy Comp:  None Cond;  Ambulatory. Voiding.  Breathing well. Pain controlled.  Taking full liquids Rx: Hydrocodone liquid (given in office) Instructions written and given Recheck: 3 weeks my office  Hosp Course:  Underwent procedure on day of admission.  Observed 23 hr .  No fever, no breathing  difficulty. Tolerating full liquid diet.  Min pain.  Voiding.  AM POD 1 discharged to home and care of family.  Jodi Marble

## 2015-10-23 NOTE — Anesthesia Postprocedure Evaluation (Signed)
Anesthesia Post Note  Patient: Kirk [name]  Procedure(s) Performed: Procedure(s) (LRB): ZENKER'S DIVERTICULECTOMY ENDOSCOPIC (N/A)  Patient location during evaluation: PACU Anesthesia Type: General Level of consciousness: awake and alert Pain management: pain level controlled Vital Signs Assessment: post-procedure vital signs reviewed and stable Respiratory status: spontaneous breathing, nonlabored ventilation, respiratory function stable and patient connected to nasal cannula oxygen Cardiovascular status: blood pressure returned to baseline and stable Postop Assessment: no signs of nausea or vomiting Anesthetic complications: no    Last Vitals:  Filed Vitals:   10/22/15 2045 10/23/15 0519  BP: 147/73 144/71  Pulse: 79 62  Temp: 36.7 C 36.7 C  Resp: 19 17    Last Pain:  Filed Vitals:   10/23/15 0520  PainSc: 3                  Catalina Gravel

## 2015-12-08 DIAGNOSIS — H401133 Primary open-angle glaucoma, bilateral, severe stage: Secondary | ICD-10-CM | POA: Diagnosis not present

## 2015-12-08 DIAGNOSIS — H04123 Dry eye syndrome of bilateral lacrimal glands: Secondary | ICD-10-CM | POA: Diagnosis not present

## 2016-01-06 ENCOUNTER — Telehealth: Payer: Self-pay

## 2016-01-06 ENCOUNTER — Encounter: Payer: Self-pay | Admitting: Internal Medicine

## 2016-01-06 ENCOUNTER — Telehealth: Payer: Self-pay | Admitting: *Deleted

## 2016-01-06 ENCOUNTER — Ambulatory Visit (INDEPENDENT_AMBULATORY_CARE_PROVIDER_SITE_OTHER): Payer: PPO | Admitting: Internal Medicine

## 2016-01-06 VITALS — BP 132/84 | HR 63 | Temp 98.0°F | Resp 17 | Ht 72.0 in | Wt 192.4 lb

## 2016-01-06 DIAGNOSIS — J111 Influenza due to unidentified influenza virus with other respiratory manifestations: Secondary | ICD-10-CM

## 2016-01-06 DIAGNOSIS — E785 Hyperlipidemia, unspecified: Secondary | ICD-10-CM

## 2016-01-06 DIAGNOSIS — R739 Hyperglycemia, unspecified: Secondary | ICD-10-CM | POA: Diagnosis not present

## 2016-01-06 DIAGNOSIS — I10 Essential (primary) hypertension: Secondary | ICD-10-CM | POA: Diagnosis not present

## 2016-01-06 MED ORDER — OSELTAMIVIR PHOSPHATE 75 MG PO CAPS: ORAL_CAPSULE | ORAL | Status: DC

## 2016-01-06 MED ORDER — PROMETHAZINE HCL 25 MG PO TABS: 25.0000 mg | ORAL_TABLET | Freq: Three times a day (TID) | ORAL | Status: DC | PRN

## 2016-01-06 NOTE — Telephone Encounter (Signed)
Patient aware, to be seen today at 1:00 pm

## 2016-01-06 NOTE — Telephone Encounter (Signed)
Advise him to come at 1 PM.

## 2016-01-06 NOTE — Telephone Encounter (Signed)
Patient is sick on his stomach, vomiting x 3, possible fever, cough (dry), weakness x 2 days. We do not have any available appointments today. Please advise  Pharmacy on file confirmed. Last OV 10-07-15

## 2016-01-06 NOTE — Addendum Note (Signed)
Addended by: Logan Bores on: 01/06/2016 02:22 PM   Modules accepted: Orders

## 2016-01-06 NOTE — Telephone Encounter (Signed)
Spoke with patient regarding missed flu swab, he stated that he couldn't come back today. He stated that he would return tomorrow to get this completed when he has to bring a friend to the doctor in the area.

## 2016-01-06 NOTE — Addendum Note (Signed)
Addended by: Eilene Ghazi on: 01/06/2016 01:58 PM   Modules accepted: Orders

## 2016-01-06 NOTE — Addendum Note (Signed)
Addended by: Eilene Ghazi on: 01/06/2016 02:11 PM   Modules accepted: Orders

## 2016-01-06 NOTE — Addendum Note (Signed)
Addended by: Eilene Ghazi on: 01/06/2016 01:59 PM   Modules accepted: Orders

## 2016-01-06 NOTE — Progress Notes (Signed)
Patient ID: Kirk [name], male   DOB: Apr 21, 1941, 75 y.o.   MRN: [id number]   [facility]  Provider: Jeanmarie Hubert, MD  Code Status: full Goals of Care:  Advanced Directives 01/06/2016  Does patient have an advance directive? Yes;No  Does patient want to make changes to advanced directive? No - Patient declined  Would patient like information on creating an advanced directive? -     Chief Complaint  Patient presents with  . Acute Visit    nausea and vomiting x 2-3 days; cough  . OTHER    Fall in shower this am: busted lower left lip  . OTHER    lightheaded/dizzy x 2-3 weeks.    HPI: Patient is a 75 y.o. male seen today for an acute visit for N&V 2-3 days, trauma to lower lip after fall, lightheaded feelings. Has had a terrible cough. No fever. Individual in their home had flu and required hospitalization.  Past Medical History  Diagnosis Date  . Unspecified vitamin D deficiency   . Unspecified pruritic disorder   . Hyperglycemia   . Pain in limb   . Disturbance of skin sensation   . Nevus, non-neoplastic   . Urinary frequency   . Herpes zoster without mention of complication   . Cough   . Dysphagia, unspecified(787.20)   . Plantar fascial fibromatosis   . Other premature beats   . Hypertension   . Encounter for long-term (current) use of other medications   . Special screening for malignant neoplasm of prostate   . Diverticulum of esophagus, acquired   . 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 Unspecified glaucoma   . Lumbago   . Hyperlipidemia   . Zenker's diverticulum   . Diverticulosis of colon 2014  . Osteoarthrosis, unspecified whether generalized or localized, unspecified site     pt. denies  . Congenital glaucoma     "both eyes operated on when I was 107 months old"    Past Surgical History  Procedure Laterality Date  . Cataract extraction bilateral w/ anterior vitrectomy Bilateral 1977  .  Hernia repair  1993  . Incisional hernia repair  2008  . Flexible sigmoidoscopy  1999  . Glaucoma surgery  1945    congenital glaucoma  . Laryngoscopy      with stapling of zenkers diverticulum  . Colonoscopy  09/06/2013    [name]  . Direct laryngoscopy  10/22/2015    Cervical esophagoscopy. Endoscopic esophageal diverticulotomy.     Allergies  Allergen Reactions  . Reserpine Other (See Comments)    Unknown reaction  . Sulfa Antibiotics Other (See Comments)    Patient denies - Unknown reaction      Medication List       This list is accurate as of: 01/06/16  1:28 PM.  Always use your most recent med list.               brimonidine 0.1 % Soln  Commonly known as:  ALPHAGAN P  Place 1 drop into both eyes 2 (two) times daily.     cholecalciferol 400 units Tabs tablet  Commonly known as:  VITAMIN D  Take 400 Units by mouth daily.     irbesartan-hydrochlorothiazide 150-12.5 MG tablet  Commonly known as:  AVALIDE  TAKE ONE TABLET DAILY TO CONTROL BLOOD PRESSURE     timolol 0.5 % ophthalmic solution  Commonly known as:  TIMOPTIC  Place 1 drop into the  right eye daily.     TYLENOL 325 MG tablet  Generic drug:  acetaminophen  Take 325 mg by mouth daily as needed (pain). One tablet once daily as needed for pain        Review of Systems:  Review of Systems  Constitutional: Negative for fever, chills, diaphoresis, activity change, appetite change, fatigue and unexpected weight change.  HENT: Positive for rhinorrhea. Negative for ear pain, hearing loss, mouth sores, nosebleeds, sinus pressure and sore throat.   Eyes: Positive for visual disturbance (hx glaucoma.).  Respiratory: Positive for cough. Negative for choking, shortness of breath, wheezing and stridor.   Cardiovascular: Negative for chest pain, palpitations and leg swelling.  Gastrointestinal: Positive for nausea. Negative for abdominal pain, diarrhea, constipation, blood in stool, abdominal distention and rectal  pain.       History of esophageal diverticulum and mild dysphagia.  Endocrine: Negative for cold intolerance, heat intolerance, polydipsia, polyphagia and polyuria.  Genitourinary: Negative for dysuria, urgency, frequency, flank pain, decreased urine volume, enuresis and difficulty urinating.       History of BPH  Musculoskeletal: Negative for myalgias, back pain, joint swelling, arthralgias, gait problem and neck pain.  Skin: Negative for color change, pallor and rash.  Allergic/Immunologic: Negative for environmental allergies, food allergies and immunocompromised state.  Neurological: Positive for numbness (to vibration and monofilament tests) and headaches. Negative for dizziness, tremors, seizures, syncope, facial asymmetry, speech difficulty, weakness and light-headedness.  Hematological: Negative.   Psychiatric/Behavioral: Negative for suicidal ideas, hallucinations, behavioral problems, confusion, sleep disturbance, self-injury, dysphoric mood, decreased concentration and agitation. The patient is not nervous/anxious and is not hyperactive.     Health Maintenance  Topic Date Due  . PNA vac Low Risk Adult (2 of 2 - PPSV23) 01/11/2013  . INFLUENZA VACCINE  10/04/2016 (Originally 05/04/2016)  . ZOSTAVAX  10/04/2016 (Originally 12/21/2001)  . TETANUS/TDAP  03/04/2021  . COLONOSCOPY  09/07/2023    Physical Exam: Filed Vitals:   01/06/16 1311  BP: 132/84  Pulse: 63  Temp: 98 F (36.7 C)  TempSrc: Oral  Resp: 17  Height: 6' (1.829 m)  Weight: 192 lb 6.4 oz (87.272 kg)  SpO2: 92%   Body mass index is 26.09 kg/(m^2). Physical Exam  Constitutional: He is oriented to person, place, and time. He appears well-developed and well-nourished. No distress.  Mildly overweight  HENT:  Head: Normocephalic and atraumatic.  Right Ear: External ear normal.  Left Ear: External ear normal.  Nose: Nose normal.  Some missing teeth.  Eyes: Conjunctivae and EOM are normal.  Abnormal pupils that  are small and eccentric. Corrective lenses.  Neck: No JVD present. No tracheal deviation present. No thyromegaly present.  Cardiovascular: Normal rate, regular rhythm, normal heart sounds and intact distal pulses.  Exam reveals no gallop and no friction rub.   No murmur heard. Pulmonary/Chest: No respiratory distress. He has no wheezes. He has no rales.  Abdominal: He exhibits no distension and no mass. There is no tenderness. There is no rebound.  Genitourinary: Prostate normal and penis normal. Guaiac negative stool. No penile tenderness.  Musculoskeletal: Normal range of motion. He exhibits no edema or tenderness.  Lymphadenopathy:    He has no cervical adenopathy.  Neurological: He is alert and oriented to person, place, and time. He has normal reflexes. No cranial nerve deficit. Coordination normal.  Absent vibratory and monofilament sensations  Skin:  Multiple SK and common nevi.  Psychiatric: He has a normal mood and affect. His behavior is normal. Judgment  and thought content normal.    Labs reviewed: Basic Metabolic Panel:  Recent Labs  02/03/15 0953 08/18/15 0952 10/16/15 1422 10/22/15 1650  NA 141 139 138  --   K 4.0 4.6 4.0  --   CL 103 101 101  --   CO2 20 25 25   --   GLUCOSE 99 102* 120*  --   BUN 15 12 13   --   CREATININE 0.90 0.93 0.90 1.03  CALCIUM 9.4 9.6 9.7  --    Liver Function Tests:  Recent Labs  08/18/15 0952  AST 22  ALT 31  ALKPHOS 63  BILITOT 0.7  PROT 6.8  ALBUMIN 4.0   No results for input(s): LIPASE, AMYLASE in the last 8760 hours. No results for input(s): AMMONIA in the last 8760 hours. CBC:  Recent Labs  10/16/15 1422 10/22/15 1650  WBC 7.7 10.5  HGB 15.9 14.9  HCT 46.8 44.4  MCV 89.7 89.0  PLT 196 177   Lipid Panel:  Recent Labs  08/18/15 0952  CHOL 174  HDL 36*  LDLCALC 99  TRIG 193*  CHOLHDL 4.8   Lab Results  Component Value Date   HGBA1C 6.1* 08/05/2014     Assessment/Plan  1. Influenza with  respiratory manifestation - CBC With Differential - Comprehensive metabolic panel - oseltamivir (TAMIFLU) 75 MG capsule; Take one capsule twice daily to treat the flu  Dispense: 10 capsule; Refill: 0 - promethazine (PHENERGAN) 25 MG tablet; Take 1 tablet (25 mg total) by mouth every 8 (eight) hours as needed for nausea or vomiting.  Dispense: 24 tablet; Refill: 0 - Influenza A and B Ag, Immunoassay    Next appt:  02/23/2016

## 2016-01-07 ENCOUNTER — Encounter (HOSPITAL_COMMUNITY): Payer: Self-pay | Admitting: *Deleted

## 2016-01-07 ENCOUNTER — Observation Stay (HOSPITAL_COMMUNITY)
Admission: EM | Admit: 2016-01-07 | Discharge: 2016-01-10 | Disposition: A | Discharge status: Home / Self Care | Payer: PPO | Attending: Internal Medicine | Admitting: Internal Medicine

## 2016-01-07 ENCOUNTER — Emergency Department (HOSPITAL_COMMUNITY): Payer: PPO

## 2016-01-07 DIAGNOSIS — E559 Vitamin D deficiency, unspecified: Secondary | ICD-10-CM | POA: Insufficient documentation

## 2016-01-07 DIAGNOSIS — E876 Hypokalemia: Secondary | ICD-10-CM | POA: Diagnosis not present

## 2016-01-07 DIAGNOSIS — R402431 Glasgow coma scale score 3-8, in the field [EMT or ambulance]: Secondary | ICD-10-CM | POA: Diagnosis not present

## 2016-01-07 DIAGNOSIS — R55 Syncope and collapse: Principal | ICD-10-CM | POA: Insufficient documentation

## 2016-01-07 DIAGNOSIS — E785 Hyperlipidemia, unspecified: Secondary | ICD-10-CM | POA: Insufficient documentation

## 2016-01-07 DIAGNOSIS — Z79899 Other long term (current) drug therapy: Secondary | ICD-10-CM | POA: Diagnosis not present

## 2016-01-07 DIAGNOSIS — J111 Influenza due to unidentified influenza virus with other respiratory manifestations: Secondary | ICD-10-CM

## 2016-01-07 DIAGNOSIS — N400 Enlarged prostate without lower urinary tract symptoms: Secondary | ICD-10-CM | POA: Diagnosis not present

## 2016-01-07 DIAGNOSIS — I10 Essential (primary) hypertension: Secondary | ICD-10-CM | POA: Diagnosis not present

## 2016-01-07 DIAGNOSIS — B349 Viral infection, unspecified: Secondary | ICD-10-CM | POA: Diagnosis not present

## 2016-01-07 DIAGNOSIS — H409 Unspecified glaucoma: Secondary | ICD-10-CM | POA: Diagnosis present

## 2016-01-07 DIAGNOSIS — R05 Cough: Secondary | ICD-10-CM | POA: Diagnosis not present

## 2016-01-07 DIAGNOSIS — Q150 Congenital glaucoma: Secondary | ICD-10-CM | POA: Insufficient documentation

## 2016-01-07 LAB — COMPREHENSIVE METABOLIC PANEL
ALT: 38 IU/L (ref 0–44)
AST: 43 IU/L — ABNORMAL HIGH (ref 0–40)
Albumin/Globulin Ratio: 1.3 (ref 1.2–2.2)
Albumin: 3.9 g/dL (ref 3.5–4.8)
Alkaline Phosphatase: 59 IU/L (ref 39–117)
BUN/Creatinine Ratio: 14 (ref 10–24)
BUN: 12 mg/dL (ref 8–27)
Bilirubin Total: 0.4 mg/dL (ref 0.0–1.2)
CO2: 22 mmol/L (ref 18–29)
Calcium: 9 mg/dL (ref 8.6–10.2)
Chloride: 90 mmol/L — ABNORMAL LOW (ref 96–106)
Creatinine, Ser: 0.84 mg/dL (ref 0.76–1.27)
GFR calc Af Amer: 100 mL/min/{1.73_m2} (ref >59)
GFR calc non Af Amer: 86 mL/min/{1.73_m2} (ref >59)
Globulin, Total: 3.1 g/dL (ref 1.5–4.5)
Glucose: 181 mg/dL — ABNORMAL HIGH (ref 65–99)
Potassium: 4.3 mmol/L (ref 3.5–5.2)
Sodium: 132 mmol/L — ABNORMAL LOW (ref 134–144)
Total Protein: 7 g/dL (ref 6.0–8.5)

## 2016-01-07 LAB — TROPONIN I: Troponin I: <0.03 ng/mL (ref <0.031)

## 2016-01-07 LAB — CBC WITH DIFFERENTIAL/PLATELET
Basophils Absolute: 0 10*3/uL (ref 0.0–0.1)
Basophils Relative: 0 %
Eosinophils Absolute: 0 10*3/uL (ref 0.0–0.7)
Eosinophils Relative: 0 %
HCT: 43.2 % (ref 39.0–52.0)
Hemoglobin: 14.4 g/dL (ref 13.0–17.0)
Lymphocytes Relative: 20 %
Lymphs Abs: 1 10*3/uL (ref 0.7–4.0)
MCH: 29.3 pg (ref 26.0–34.0)
MCHC: 33.3 g/dL (ref 30.0–36.0)
MCV: 87.8 fL (ref 78.0–100.0)
Monocytes Absolute: 0.6 10*3/uL (ref 0.1–1.0)
Monocytes Relative: 12 %
Neutro Abs: 3.2 10*3/uL (ref 1.7–7.7)
Neutrophils Relative %: 68 %
Platelets: 132 10*3/uL — ABNORMAL LOW (ref 150–400)
RBC: 4.92 MIL/uL (ref 4.22–5.81)
RDW: 13.8 % (ref 11.5–15.5)
WBC: 4.7 10*3/uL (ref 4.0–10.5)

## 2016-01-07 LAB — BASIC METABOLIC PANEL
Anion gap: 12 (ref 5–15)
BUN: 10 mg/dL (ref 6–20)
CO2: 25 mmol/L (ref 22–32)
Calcium: 8.8 mg/dL — ABNORMAL LOW (ref 8.9–10.3)
Chloride: 99 mmol/L — ABNORMAL LOW (ref 101–111)
Creatinine, Ser: 1.1 mg/dL (ref 0.61–1.24)
GFR calc Af Amer: >60 mL/min (ref >60)
GFR calc non Af Amer: >60 mL/min (ref >60)
Glucose, Bld: 142 mg/dL — ABNORMAL HIGH (ref 65–99)
Potassium: 3.3 mmol/L — ABNORMAL LOW (ref 3.5–5.1)
Sodium: 136 mmol/L (ref 135–145)

## 2016-01-07 LAB — LIPID PANEL
Chol/HDL Ratio: 3.5 ratio units (ref 0.0–5.0)
Cholesterol, Total: 129 mg/dL (ref 100–199)
HDL: 37 mg/dL — ABNORMAL LOW (ref >39)
LDL Calculated: 77 mg/dL (ref 0–99)
Triglycerides: 76 mg/dL (ref 0–149)
VLDL Cholesterol Cal: 15 mg/dL (ref 5–40)

## 2016-01-07 LAB — MAGNESIUM: Magnesium: 2.3 mg/dL (ref 1.7–2.4)

## 2016-01-07 LAB — PHOSPHORUS: Phosphorus: 1.8 mg/dL — ABNORMAL LOW (ref 2.5–4.6)

## 2016-01-07 MED ORDER — IRBESARTAN-HYDROCHLOROTHIAZIDE 150-12.5 MG PO TABS: 1.0000 | ORAL_TABLET | Freq: Every day | ORAL | Status: DC

## 2016-01-07 MED ORDER — HYDROCHLOROTHIAZIDE 12.5 MG PO CAPS
12.5000 mg | ORAL_CAPSULE | Freq: Every day | ORAL | Status: DC
  Administered 2016-01-08: 12.5 mg via ORAL
  Filled 2016-01-07: qty 1

## 2016-01-07 MED ORDER — ENOXAPARIN SODIUM 40 MG/0.4ML ~~LOC~~ SOLN
40.0000 mg | SUBCUTANEOUS | Status: DC
  Administered 2016-01-07 – 2016-01-09 (×3): 40 mg via SUBCUTANEOUS
  Filled 2016-01-07 (×3): qty 0.4

## 2016-01-07 MED ORDER — K PHOS MONO-SOD PHOS DI & MONO 155-852-130 MG PO TABS
500.0000 mg | ORAL_TABLET | Freq: Three times a day (TID) | ORAL | Status: AC
  Administered 2016-01-07 – 2016-01-08 (×3): 500 mg via ORAL
  Filled 2016-01-07 (×3): qty 2

## 2016-01-07 MED ORDER — PROMETHAZINE HCL 25 MG PO TABS: 25.0000 mg | ORAL_TABLET | Freq: Three times a day (TID) | ORAL | Status: DC | PRN

## 2016-01-07 MED ORDER — POTASSIUM CHLORIDE CRYS ER 20 MEQ PO TBCR
40.0000 meq | EXTENDED_RELEASE_TABLET | Freq: Once | ORAL | Status: AC
  Administered 2016-01-07: 40 meq via ORAL
  Filled 2016-01-07: qty 2

## 2016-01-07 MED ORDER — CHOLECALCIFEROL 10 MCG (400 UNIT) PO TABS
400.0000 [IU] | ORAL_TABLET | Freq: Every day | ORAL | Status: DC
  Administered 2016-01-08 – 2016-01-10 (×3): 400 [IU] via ORAL
  Filled 2016-01-07 (×3): qty 1

## 2016-01-07 MED ORDER — SODIUM CHLORIDE 0.9 % IV BOLUS (SEPSIS)
1000.0000 mL | Freq: Once | INTRAVENOUS | Status: AC
  Administered 2016-01-07: 1000 mL via INTRAVENOUS

## 2016-01-07 MED ORDER — POTASSIUM CHLORIDE IN NACL 40-0.9 MEQ/L-% IV SOLN
INTRAVENOUS | Status: DC
  Administered 2016-01-07 – 2016-01-10 (×6): 100 mL/h via INTRAVENOUS
  Filled 2016-01-07 (×10): qty 1000

## 2016-01-07 MED ORDER — IRBESARTAN 300 MG PO TABS
150.0000 mg | ORAL_TABLET | Freq: Every day | ORAL | Status: DC
  Administered 2016-01-08 – 2016-01-09 (×2): 150 mg via ORAL
  Filled 2016-01-07 (×2): qty 1

## 2016-01-07 MED ORDER — BRIMONIDINE TARTRATE 0.15 % OP SOLN
1.0000 [drp] | Freq: Two times a day (BID) | OPHTHALMIC | Status: DC
  Administered 2016-01-07 – 2016-01-10 (×6): 1 [drp] via OPHTHALMIC
  Filled 2016-01-07: qty 5

## 2016-01-07 MED ORDER — TIMOLOL MALEATE 0.5 % OP SOLN
1.0000 [drp] | Freq: Every day | OPHTHALMIC | Status: DC
  Administered 2016-01-08 – 2016-01-10 (×3): 1 [drp] via OPHTHALMIC
  Filled 2016-01-07: qty 5

## 2016-01-07 MED ORDER — ACETAMINOPHEN 325 MG PO TABS: 325.0000 mg | ORAL_TABLET | Freq: Every day | ORAL | Status: DC | PRN

## 2016-01-07 NOTE — ED Notes (Signed)
Attempted report 

## 2016-01-07 NOTE — ED Notes (Signed)
Dinner Tray ordered @ [id number].

## 2016-01-07 NOTE — ED Notes (Signed)
Pt in no distress standing without assistance tolerated well

## 2016-01-07 NOTE — ED Notes (Signed)
The pr arrived by gems from a restaurant where he was eating.  The pts wife reports halfway through the meal the pt just started jerking with loss of consciousness.  The jerking lasted approx  2 minutes  .  On arrival here the pt is alert and oriented skin warm and dry  He has been ill for several days with n and v.  cbg 120  The pt fell last pm when he was attempting to vomit

## 2016-01-07 NOTE — H&P (Signed)
[facility] History and Physical  TENUUN PIRRELLO [id number] DOB: [birth year] DOA: 01/07/2016  Referring physician:  Sherwood Gambler, MD PCP: Estill Dooms, MD   Chief Complaint: LOC.  HPI: Kirk [name] is a 75 y.o. male with a past medical history hypertension, hyperlipidemia, congenital ago,, diverticulosis, hyperglycemia who was brought to the emergency department via EMS due to loss of consciousness.  Per patient, he went to see his doctor yesterday due to having cough, nausea, vomiting, fatigue and body aches for since last week. He was prescribed Tamiflu and and went home, but was called from the clinic to come back today in the morning to get a repeat nasal swab. Prior to this, he went to eat with his wife, and while eating he suddenly passed out. Per wife, he was moving his extremities for about a minute, then woke up confused for a few minutes prior to his mentation returning to baseline. He denies chest pain, palpitations, diaphoresis, pitting edema of the lower extremities, dyspnea, but complains of dizziness. Patient has been having at least 2-3 episodes of emesis daily for the past 2 days. He denies diarrhea, constipation, melena or hematochezia. He denies GU symptoms.   Review of Systems:  Constitutional: Positive chills, fatigue.  No weight loss, night sweats, Fevers HEENT:  No headaches, Difficulty swallowing,Tooth/dental problems,Sore throat,  No sneezing, itching, ear ache, nasal congestion, post nasal drip,  Cardio-vascular:  As above mentioned. GI:  Positive nausea, vomiting No heartburn, indigestion, abdominal pain,  diarrhea, change in bowel habits, loss of appetite  Resp:  Positive nonproductive cough, no wheezing, no hemoptysis. Skin:  No rash or lesions.  GU:  No dysuria, change in color of urine, no urgency or frequency. No flank pain.  Musculoskeletal:  Positive myalgias. No joint pain or swelling. No decreased range of motion. No back pain.    Psych:  No change in mood or affect. No depression or anxiety. No memory loss.   Past Medical History  Diagnosis Date  . Unspecified vitamin D deficiency   . Unspecified pruritic disorder   . Hyperglycemia   . Pain in limb   . Disturbance of skin sensation   . Nevus, non-neoplastic   . Urinary frequency   . Herpes zoster without mention of complication   . Cough   . Dysphagia, unspecified(787.20)   . Plantar fascial fibromatosis   . Other premature beats   . Hypertension   . Encounter for long-term (current) use of other medications   . Special screening for malignant neoplasm of prostate   . Diverticulum of esophagus, acquired   . 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 Unspecified glaucoma   . Lumbago   . Hyperlipidemia   . Zenker's diverticulum   . Diverticulosis of colon 2014  . Osteoarthrosis, unspecified whether generalized or localized, unspecified site     pt. denies  . Congenital glaucoma     "both eyes operated on when I was 47 months old"   Past Surgical History  Procedure Laterality Date  . Cataract extraction bilateral w/ anterior vitrectomy Bilateral 1977  . Hernia repair  1993  . Incisional hernia repair  2008  . Flexible sigmoidoscopy  1999  . Glaucoma surgery  1945    congenital glaucoma  . Laryngoscopy      with stapling of zenkers diverticulum  . Colonoscopy  09/06/2013    [name]  . Direct laryngoscopy  10/22/2015    Cervical  esophagoscopy. Endoscopic esophageal diverticulotomy.    Social History:  reports that he has never smoked. He has never used smokeless tobacco. He reports that he does not drink alcohol or use illicit drugs.  Allergies  Allergen Reactions  . Reserpine Other (See Comments)    Unknown reaction  . Sulfa Antibiotics Other (See Comments)    Patient denies - Unknown reaction    Family History  Problem Relation Age of Onset  . Hypertension Mother   . Dementia  Mother   . Kidney disease Father   . CAD Father   . Colon cancer Neg Hx   . Throat cancer Neg Hx   . Diabetes Neg Hx   . Liver disease Neg Hx     Prior to Admission medications   Medication Sig Start Date End Date Taking? Authorizing Provider  acetaminophen (TYLENOL) 325 MG tablet Take 325 mg by mouth daily as needed (pain). One tablet once daily as needed for pain   Yes Historical Provider, MD  brimonidine (ALPHAGAN P) 0.1 % SOLN Place 1 drop into both eyes 2 (two) times daily.    Yes Historical Provider, MD  cholecalciferol (VITAMIN D) 400 units TABS tablet Take 400 Units by mouth daily.   Yes Historical Provider, MD  irbesartan-hydrochlorothiazide (AVALIDE) 150-12.5 MG tablet TAKE ONE TABLET DAILY TO CONTROL BLOOD PRESSURE 07/28/15  Yes Estill Dooms, MD  timolol (TIMOPTIC) 0.5 % ophthalmic solution Place 1 drop into the right eye daily.  08/20/13  Yes Historical Provider, MD  oseltamivir (TAMIFLU) 75 MG capsule Take one capsule twice daily to treat the flu 01/06/16   Estill Dooms, MD  promethazine (PHENERGAN) 25 MG tablet Take 1 tablet (25 mg total) by mouth every 8 (eight) hours as needed for nausea or vomiting. 01/06/16   Estill Dooms, MD   Physical Exam: Filed Vitals:   01/07/16 1900 01/07/16 1930 01/07/16 2000 01/07/16 2100  BP: 137/76 122/78 114/71 145/78  Pulse: 67 64 64 85  Temp:    98.4 F (36.9 C)  TempSrc:    Oral  Resp: 23 10 10 16   Height:    6' (1.829 m)  Weight:    85.775 kg (189 lb 1.6 oz)  SpO2: 100% 99% 96% 99%    Wt Readings from Last 3 Encounters:  01/07/16 85.775 kg (189 lb 1.6 oz)  01/06/16 87.272 kg (192 lb 6.4 oz)  10/22/15 92.534 kg (204 lb)    General:  Appears calm and comfortable Eyes: PERRL, normal lids, irises & conjunctiva ENT: grossly normal hearing, lips & tongue. Oral mucosa mildly dry. Neck: no LAD, masses or thyromegaly Cardiovascular: RRR, no m/r/g. No LE edema. Telemetry: SR, no arrhythmias  Respiratory: CTA bilaterally, no w/r/r.  Normal respiratory effort. Abdomen: soft, ntnd Skin: no rash or induration seen on limited exam Musculoskeletal: grossly normal tone BUE/BLE Psychiatric: grossly normal mood and affect, speech fluent and appropriate Neurologic: Awake, alert, oriented 4, grossly non-focal.          Labs on Admission:  Basic Metabolic Panel:  Recent Labs Lab 01/06/16 1423 01/07/16 1653 01/07/16 2118  NA 132* 136  --   K 4.3 3.3*  --   CL 90* 99*  --   CO2 22 25  --   GLUCOSE 181* 142*  --   BUN 12 10  --   CREATININE 0.84 1.10  --   CALCIUM 9.0 8.8*  --   MG  --   --  2.3  PHOS  --   --  1.8*   Liver Function Tests:  Recent Labs Lab 01/06/16 1423  AST 43*  ALT 38  ALKPHOS 59  BILITOT 0.4  PROT 7.0  ALBUMIN 3.9   CBC:  Recent Labs Lab 01/07/16 1653  WBC 4.7  NEUTROABS 3.2  HGB 14.4  HCT 43.2  MCV 87.8  PLT 132*    Radiological Exams on Admission: Dg Chest 2 View  01/07/2016  CLINICAL DATA:  Cough for 3 weeks. EXAM: CHEST - 2 VIEW COMPARISON:  12/15/2010 FINDINGS: The heart size and mediastinal contours are within normal limits. There is no evidence of pulmonary edema, consolidation, pneumothorax, nodule or pleural fluid. The visualized skeletal structures are unremarkable. IMPRESSION: No active disease. Electronically Signed   By: Aletta Edouard M.D.   On: 01/07/2016 16:07   Ct Head Wo Contrast  01/07/2016  CLINICAL DATA:  Syncope versus seizure EXAM: CT HEAD WITHOUT CONTRAST TECHNIQUE: Contiguous axial images were obtained from the base of the skull through the vertex without intravenous contrast. COMPARISON:  CT head 12/15/2010 FINDINGS: Ventricle size normal. Mild atrophy. Mild chronic microvascular ischemic change in the white matter. Negative for acute infarct. Negative for hemorrhage or mass lesion. No shift of the midline structures. Calvarium is normal.  Mild mucosal edema in the paranasal sinuses. IMPRESSION: Atrophy and chronic microvascular ischemia.  No acute  abnormality. Electronically Signed   By: Franchot Gallo M.D.   On: 01/07/2016 16:40    EKG: Independently reviewed. Vent. rate 72 BPM PR interval 155 ms QRS duration 99 ms QT/QTc 394/431 ms P-R-T axes 31 -58 62 Normal sinus rhythm no acute ST/T changes no significant change since 2012  Assessment/Plan Principal Problem:   Syncope Admit to telemetry. Gentle IV fluids. Trend troponin levels. Check echocardiogram and carotid Dopplers.  Active Problems:   Essential hypertension Resume light in the morning. Monitor blood pressure.    Hypokalemia Replacing. Follow-up potassium level.    Glaucoma Continue timolol and Alphagan drops.    Viral syndrome Check influenza by PCR swab. Continue Tamiflu for now.     Code Status: Full code. DVT Prophylaxis: Lovenox SQ. Family Communication:  Disposition Plan: Admit for further evaluation and treatment.   Time spent: About 70 minutes were spent in the process of this admission.   Reubin Milan, M.D. [facility] Pager [phone].

## 2016-01-07 NOTE — ED Provider Notes (Signed)
CSN: [id number]     Arrival date & time 01/07/16  1522 History   First MD Initiated Contact with Patient 01/07/16 1524     No chief complaint on file.    (Consider location/radiation/quality/duration/timing/severity/associated sxs/prior Treatment) HPI  75 year old male presents with a chief complaint of syncope. Patient was out eating lunch with his wife and all of a sudden passed out. Wife states that he all of a sudden seemed to freeze and then his head slumped over. He was shaking in all 4 extremities, she thinks this is lasted at least 1 minute. He then seemed confused when he first woke up and after a few minutes seem back to normal. No headaches, chest pain, shortness of breath. Has been having cough for 1 week and was treated with Tamiflu starting yesterday for influenza. Patient has been feeling lightheaded when standing for about one week. Has also had 2-3 episodes of vomiting per day for last couple days. No diarrhea. Currently feels well.  Past Medical History  Diagnosis Date  . Unspecified vitamin D deficiency   . Unspecified pruritic disorder   . Hyperglycemia   . Pain in limb   . Disturbance of skin sensation   . Nevus, non-neoplastic   . Urinary frequency   . Herpes zoster without mention of complication   . Cough   . Dysphagia, unspecified(787.20)   . Plantar fascial fibromatosis   . Other premature beats   . Hypertension   . Encounter for long-term (current) use of other medications   . Special screening for malignant neoplasm of prostate   . Diverticulum of esophagus, acquired   . 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 Unspecified glaucoma   . Lumbago   . Hyperlipidemia   . Zenker's diverticulum   . Diverticulosis of colon 2014  . Osteoarthrosis, unspecified whether generalized or localized, unspecified site     pt. denies  . Congenital glaucoma     "both eyes operated on when I was 72 months old"    Past Surgical History  Procedure Laterality Date  . Cataract extraction bilateral w/ anterior vitrectomy Bilateral 1977  . Hernia repair  1993  . Incisional hernia repair  2008  . Flexible sigmoidoscopy  1999  . Glaucoma surgery  1945    congenital glaucoma  . Laryngoscopy      with stapling of zenkers diverticulum  . Colonoscopy  09/06/2013    [name]  . Direct laryngoscopy  10/22/2015    Cervical esophagoscopy. Endoscopic esophageal diverticulotomy.    Family History  Problem Relation Age of Onset  . Hypertension Mother   . Dementia Mother   . Kidney disease Father   . CAD Father   . Colon cancer Neg Hx   . Throat cancer Neg Hx   . Diabetes Neg Hx   . Liver disease Neg Hx    Social History  Substance Use Topics  . Smoking status: Never Smoker   . Smokeless tobacco: Never Used  . Alcohol Use: No    Review of Systems  Constitutional: Negative for fever.  Respiratory: Negative for shortness of breath.   Cardiovascular: Negative for chest pain.  Gastrointestinal: Positive for nausea and vomiting. Negative for abdominal pain.  Genitourinary: Negative for dysuria.  Neurological: Positive for syncope and light-headedness. Negative for headaches.  All other systems reviewed and are negative.     Allergies  Reserpine and Sulfa antibiotics  Home Medications   Prior to Admission  medications   Medication Sig Start Date End Date Taking? Authorizing Provider  acetaminophen (TYLENOL) 325 MG tablet Take 325 mg by mouth daily as needed (pain). One tablet once daily as needed for pain    Historical Provider, MD  brimonidine (ALPHAGAN P) 0.1 % SOLN Place 1 drop into both eyes 2 (two) times daily.     Historical Provider, MD  cholecalciferol (VITAMIN D) 400 units TABS tablet Take 400 Units by mouth daily.    Historical Provider, MD  irbesartan-hydrochlorothiazide (AVALIDE) 150-12.5 MG tablet TAKE ONE TABLET DAILY TO CONTROL BLOOD PRESSURE 07/28/15   Estill Dooms, MD   oseltamivir (TAMIFLU) 75 MG capsule Take one capsule twice daily to treat the flu 01/06/16   Estill Dooms, MD  promethazine (PHENERGAN) 25 MG tablet Take 1 tablet (25 mg total) by mouth every 8 (eight) hours as needed for nausea or vomiting. 01/06/16   Estill Dooms, MD  timolol (TIMOPTIC) 0.5 % ophthalmic solution Place 1 drop into the right eye daily.  08/20/13   Historical Provider, MD   There were no vitals taken for this visit. Physical Exam  Constitutional: He is oriented to person, place, and time. He appears well-developed and well-nourished.  HENT:  Head: Normocephalic and atraumatic.  Right Ear: External ear normal.  Left Ear: External ear normal.  Nose: Nose normal.  Eyes: Right eye exhibits no discharge. Left eye exhibits no discharge.  Bilateral cataracts  Neck: Neck supple.  Cardiovascular: Normal rate, regular rhythm, normal heart sounds and intact distal pulses.   No murmur heard. Pulmonary/Chest: Effort normal and breath sounds normal.  Abdominal: Soft. There is no tenderness.  Musculoskeletal: He exhibits no edema.  Neurological: He is alert and oriented to person, place, and time.  CN 2-12 grossly intact. 5/5 strength in all 4 extremities. Grossly normal sensation  Skin: Skin is warm and dry.  Nursing note and vitals reviewed.   ED Course  Procedures (including critical care time) Labs Review Labs Reviewed  BASIC METABOLIC PANEL - Abnormal; Notable for the following:    Potassium 3.3 (*)    Chloride 99 (*)    Glucose, Bld 142 (*)    Calcium 8.8 (*)    All other components within normal limits  CBC WITH DIFFERENTIAL/PLATELET - Abnormal; Notable for the following:    Platelets 132 (*)    All other components within normal limits  MAGNESIUM  PHOSPHORUS    Imaging Review Dg Chest 2 View  01/07/2016  CLINICAL DATA:  Cough for 3 weeks. EXAM: CHEST - 2 VIEW COMPARISON:  12/15/2010 FINDINGS: The heart size and mediastinal contours are within normal limits.  There is no evidence of pulmonary edema, consolidation, pneumothorax, nodule or pleural fluid. The visualized skeletal structures are unremarkable. IMPRESSION: No active disease. Electronically Signed   By: Aletta Edouard M.D.   On: 01/07/2016 16:07   Ct Head Wo Contrast  01/07/2016  CLINICAL DATA:  Syncope versus seizure EXAM: CT HEAD WITHOUT CONTRAST TECHNIQUE: Contiguous axial images were obtained from the base of the skull through the vertex without intravenous contrast. COMPARISON:  CT head 12/15/2010 FINDINGS: Ventricle size normal. Mild atrophy. Mild chronic microvascular ischemic change in the white matter. Negative for acute infarct. Negative for hemorrhage or mass lesion. No shift of the midline structures. Calvarium is normal.  Mild mucosal edema in the paranasal sinuses. IMPRESSION: Atrophy and chronic microvascular ischemia.  No acute abnormality. Electronically Signed   By: Franchot Gallo M.D.   On: 01/07/2016 16:40  I have personally reviewed and evaluated these images and lab results as part of my medical decision-making.   EKG Interpretation   Date/Time:  Wednesday January 07 2016 15:48:00 EDT Ventricular Rate:  72 PR Interval:  155 QRS Duration: 99 QT Interval:  394 QTC Calculation: 431 R Axis:   -58 Text Interpretation:  Normal sinus rhythm no acute ST/T changes no  significant change since 2012 Confirmed by Yeny Schmoll  MD, Placida Cambre ([id number]) on  01/07/2016 3:53:31 PM      MDM   Final diagnoses:  Syncope, unspecified syncope type    I think likely patient had syncope rather than seizure. He overall appears well now. He was mildly hypotensive with systolic BP in the 0000000 on arrival but this has improved with fluids. No change with orthostatics. While dehydration could be playing a role it seems odd that he would have syncope while at rest sitting down eating lunch. Due to this there is some concern for possible arrhythmia. Plan to admit to the hospitalist for overnight observation  on telemetry.    Sherwood Gambler, MD 01/07/16 1902

## 2016-01-08 ENCOUNTER — Inpatient Hospital Stay (HOSPITAL_BASED_OUTPATIENT_CLINIC_OR_DEPARTMENT_OTHER): Payer: PPO

## 2016-01-08 ENCOUNTER — Inpatient Hospital Stay (HOSPITAL_COMMUNITY): Payer: PPO

## 2016-01-08 ENCOUNTER — Telehealth: Payer: Self-pay | Admitting: *Deleted

## 2016-01-08 DIAGNOSIS — I10 Essential (primary) hypertension: Secondary | ICD-10-CM | POA: Diagnosis not present

## 2016-01-08 DIAGNOSIS — J111 Influenza due to unidentified influenza virus with other respiratory manifestations: Secondary | ICD-10-CM

## 2016-01-08 DIAGNOSIS — E876 Hypokalemia: Secondary | ICD-10-CM

## 2016-01-08 DIAGNOSIS — R55 Syncope and collapse: Secondary | ICD-10-CM | POA: Diagnosis not present

## 2016-01-08 DIAGNOSIS — H409 Unspecified glaucoma: Secondary | ICD-10-CM

## 2016-01-08 LAB — URINALYSIS, ROUTINE W REFLEX MICROSCOPIC
Bilirubin Urine: NEGATIVE
Glucose, UA: NEGATIVE mg/dL
Hgb urine dipstick: NEGATIVE
Ketones, ur: NEGATIVE mg/dL
Leukocytes, UA: NEGATIVE
Nitrite: NEGATIVE
Protein, ur: NEGATIVE mg/dL
Specific Gravity, Urine: 1.012 (ref 1.005–1.030)
pH: 6.5 (ref 5.0–8.0)

## 2016-01-08 LAB — BASIC METABOLIC PANEL
Anion gap: 11 (ref 5–15)
BUN: 9 mg/dL (ref 6–20)
CO2: 25 mmol/L (ref 22–32)
Calcium: 8.4 mg/dL — ABNORMAL LOW (ref 8.9–10.3)
Chloride: 103 mmol/L (ref 101–111)
Creatinine, Ser: 1.06 mg/dL (ref 0.61–1.24)
GFR calc Af Amer: >60 mL/min (ref >60)
GFR calc non Af Amer: >60 mL/min (ref >60)
Glucose, Bld: 106 mg/dL — ABNORMAL HIGH (ref 65–99)
Potassium: 3.6 mmol/L (ref 3.5–5.1)
Sodium: 139 mmol/L (ref 135–145)

## 2016-01-08 LAB — ECHOCARDIOGRAM COMPLETE
Height: 72 in
Weight: 3041.6 oz

## 2016-01-08 LAB — INFLUENZA PANEL BY PCR (TYPE A & B)
H1N1 flu by pcr: NOT DETECTED
Influenza A By PCR: NEGATIVE
Influenza B By PCR: POSITIVE — AB

## 2016-01-08 LAB — TROPONIN I: Troponin I: <0.03 ng/mL (ref <0.031)

## 2016-01-08 MED ORDER — GUAIFENESIN-DM 100-10 MG/5ML PO SYRP
5.0000 mL | ORAL_SOLUTION | ORAL | Status: DC | PRN
  Administered 2016-01-08: 5 mL via ORAL
  Filled 2016-01-08: qty 5

## 2016-01-08 MED ORDER — OSELTAMIVIR PHOSPHATE 75 MG PO CAPS
75.0000 mg | ORAL_CAPSULE | Freq: Two times a day (BID) | ORAL | Status: DC
Start: 2016-01-08 — End: 2016-01-10
  Administered 2016-01-08 – 2016-01-10 (×5): 75 mg via ORAL
  Filled 2016-01-08 (×6): qty 1

## 2016-01-08 NOTE — Progress Notes (Signed)
PROGRESS NOTE  Kirk [name] [id number] DOB: [birth year] DOA: 01/07/2016 PCP: Estill Dooms, MD  HPI/Recap of past 24 hours:  Feeling better, some dry cough, no chest pain, no sob  Assessment/Plan: Principal Problem:   Syncope Active Problems:   Essential hypertension   Hyperlipidemia   Glaucoma   BPH (benign prostatic hyperplasia)   Viral syndrome  Influenza B: on tamiflu, droplet precaution. cxr unremarkable  Syncope? Per admitting MD, patient loss consciousness briefly, but patient denies today, Ct head no acute findings, exam nonfocal. He is aaox3 today, troponin negative, sinus rhythm on tele, troponin negative, echocardiogram and carotid US pending, orthostatic vital sign after hydration wnl.  HTN: will continue avapro, hold hctz, clinically dry  Hypokalemia: replace k, check mag  Code Status: full  Family Communication: patient   Disposition Plan: home in 1-2 days   Consultants:  none  Procedures:  none  Antibiotics:  none   Objective: BP 113/58 mmHg  Pulse 69  Temp(Src) 99.5 F (37.5 C) (Oral)  Resp 18  Ht 6' (1.829 m)  Wt 86.229 kg (190 lb 1.6 oz)  BMI 25.78 kg/m2  SpO2 97%  Intake/Output Summary (Last 24 hours) at 01/08/16 1325 Last data filed at 01/08/16 0915  Gross per 24 hour  Intake 2621.67 ml  Output   1000 ml  Net 1621.67 ml   Filed Weights   01/07/16 1550 01/07/16 2100 01/08/16 0540  Weight: 86.183 kg (190 lb) 85.775 kg (189 lb 1.6 oz) 86.229 kg (190 lb 1.6 oz)    Exam:   General:  NAD  Cardiovascular: RRR  Respiratory: CTABL  Abdomen: Soft/ND/NT, positive BS  Musculoskeletal: No Edema  Neuro: aaox3  Data Reviewed: Basic Metabolic Panel:  Recent Labs Lab 01/06/16 1423 01/07/16 1653 01/07/16 2118 01/08/16 0527  NA 132* 136  --  139  K 4.3 3.3*  --  3.6  CL 90* 99*  --  103  CO2 22 25  --  25  GLUCOSE 181* 142*  --  106*  BUN 12 10  --  9  CREATININE 0.84 1.10  --  1.06  CALCIUM 9.0 8.8*  --  8.4*   MG  --   --  2.3  --   PHOS  --   --  1.8*  --    Liver Function Tests:  Recent Labs Lab 01/06/16 1423  AST 43*  ALT 38  ALKPHOS 59  BILITOT 0.4  PROT 7.0  ALBUMIN 3.9   No results for input(s): LIPASE, AMYLASE in the last 168 hours. No results for input(s): AMMONIA in the last 168 hours. CBC:  Recent Labs Lab 01/07/16 1653  WBC 4.7  NEUTROABS 3.2  HGB 14.4  HCT 43.2  MCV 87.8  PLT 132*   Cardiac Enzymes:    Recent Labs Lab 01/07/16 2227 01/08/16 0527  TROPONINI <0.03 <0.03   BNP (last 3 results) No results for input(s): BNP in the last 8760 hours.  ProBNP (last 3 results) No results for input(s): PROBNP in the last 8760 hours.  CBG: No results for input(s): GLUCAP in the last 168 hours.  No results found for this or any previous visit (from the past 240 hour(s)).   Studies: Dg Chest 2 View  01/07/2016  CLINICAL DATA:  Cough for 3 weeks. EXAM: CHEST - 2 VIEW COMPARISON:  12/15/2010 FINDINGS: The heart size and mediastinal contours are within normal limits. There is no evidence of pulmonary edema, consolidation, pneumothorax, nodule or pleural fluid. The visualized  skeletal structures are unremarkable. IMPRESSION: No active disease. Electronically Signed   By: Aletta Edouard M.D.   On: 01/07/2016 16:07   Ct Head Wo Contrast  01/07/2016  CLINICAL DATA:  Syncope versus seizure EXAM: CT HEAD WITHOUT CONTRAST TECHNIQUE: Contiguous axial images were obtained from the base of the skull through the vertex without intravenous contrast. COMPARISON:  CT head 12/15/2010 FINDINGS: Ventricle size normal. Mild atrophy. Mild chronic microvascular ischemic change in the white matter. Negative for acute infarct. Negative for hemorrhage or mass lesion. No shift of the midline structures. Calvarium is normal.  Mild mucosal edema in the paranasal sinuses. IMPRESSION: Atrophy and chronic microvascular ischemia.  No acute abnormality. Electronically Signed   By: Franchot Gallo M.D.    On: 01/07/2016 16:40    Scheduled Meds: . brimonidine  1 drop Both Eyes BID  . cholecalciferol  400 Units Oral Daily  . enoxaparin (LOVENOX) injection  40 mg Subcutaneous Q24H  . irbesartan  150 mg Oral Daily  . oseltamivir  75 mg Oral BID  . phosphorus  500 mg Oral TID  . timolol  1 drop Right Eye Daily    Continuous Infusions: . 0.9 % NaCl with KCl 40 mEq / L 100 mL/hr (01/08/16 1147)     Time spent: 61mins  Anella Nakata MD, PhD  [facility] Pager [phone]. If 7PM-7AM, please contact night-coverage at [email or web address], password [facility] 01/08/2016, 1:25 PM  LOS: 1 day

## 2016-01-08 NOTE — Progress Notes (Signed)
PT Cancellation Note  Patient Details Name: Kirk [name] MRN: [id number] DOB: Dec 05, 1940   Cancelled Treatment:    Reason Eval/Treat Not Completed: Patient at procedure or test/unavailable (Pt in Echo lab.  Will return as able.  )   Hayzel Ruberg, [place] 01/08/2016, 1:46 PM [place] Ismerai Bin,PT Acute Rehabilitation [phone] (pager)

## 2016-01-08 NOTE — Progress Notes (Signed)
Pt a/o, c/o cough PRN meds given as ordered, pt stable

## 2016-01-08 NOTE — Telephone Encounter (Signed)
Wife calling just as FYI that they where coming by to get a flu test with Dr. Nyoka Cowden and her husband passed out at a restaurant and has been admitted into [facility], per wife was diagnosed with the flu.

## 2016-01-08 NOTE — Progress Notes (Signed)
Echocardiogram 2D Echocardiogram has been performed.  Tresa Res 01/08/2016, 12:28 PM

## 2016-01-08 NOTE — Progress Notes (Signed)
VASCULAR LAB PRELIMINARY  PRELIMINARY  PRELIMINARY  PRELIMINARY  Carotid duplex completed.    Preliminary report:  1-39% ICA plaquing.  Vertebral artery flow is antegrade.   Lachrista Heslin, RVT 01/08/2016, 9:30 AM

## 2016-01-09 DIAGNOSIS — J111 Influenza due to unidentified influenza virus with other respiratory manifestations: Secondary | ICD-10-CM | POA: Diagnosis not present

## 2016-01-09 DIAGNOSIS — R55 Syncope and collapse: Secondary | ICD-10-CM | POA: Diagnosis not present

## 2016-01-09 DIAGNOSIS — I10 Essential (primary) hypertension: Secondary | ICD-10-CM | POA: Diagnosis not present

## 2016-01-09 DIAGNOSIS — H409 Unspecified glaucoma: Secondary | ICD-10-CM | POA: Diagnosis not present

## 2016-01-09 LAB — BASIC METABOLIC PANEL
Anion gap: 8 (ref 5–15)
BUN: 5 mg/dL — ABNORMAL LOW (ref 6–20)
CO2: 25 mmol/L (ref 22–32)
Calcium: 8.2 mg/dL — ABNORMAL LOW (ref 8.9–10.3)
Chloride: 103 mmol/L (ref 101–111)
Creatinine, Ser: 0.83 mg/dL (ref 0.61–1.24)
GFR calc Af Amer: >60 mL/min (ref >60)
GFR calc non Af Amer: >60 mL/min (ref >60)
Glucose, Bld: 87 mg/dL (ref 65–99)
Potassium: 4.3 mmol/L (ref 3.5–5.1)
Sodium: 136 mmol/L (ref 135–145)

## 2016-01-09 LAB — MAGNESIUM: Magnesium: 1.9 mg/dL (ref 1.7–2.4)

## 2016-01-09 LAB — TSH: TSH: 4.531 u[IU]/mL — ABNORMAL HIGH (ref 0.350–4.500)

## 2016-01-09 MED ORDER — IRBESARTAN 75 MG PO TABS
75.0000 mg | ORAL_TABLET | Freq: Every day | ORAL | Status: DC
  Administered 2016-01-10: 75 mg via ORAL
  Filled 2016-01-09: qty 1

## 2016-01-09 MED ORDER — BENZONATATE 100 MG PO CAPS: 100.0000 mg | ORAL_CAPSULE | Freq: Three times a day (TID) | ORAL | Status: DC | PRN

## 2016-01-09 NOTE — Evaluation (Signed)
Physical Therapy Evaluation Patient Details Name: Kirk [name] MRN: [id number] DOB: 1941-01-12 Today's Date: 01/09/2016   History of Present Illness  75 y.o. male with a past medical history hypertension, hyperlipidemia, congenital ago,, diverticulosis, hyperglycemia who was brought to the emergency department via EMS due to loss of consciousness. FLU +  Clinical Impression  Patient demonstrates deficits in functional mobility as indicated below. Will need continued skilled PT to address deficits and maximize function. Will see as indicated and progress as tolerated. OF NTOE:VSS, no DOE or SOB noted, but patient easily fatigues    Follow Up Recommendations No PT follow up;Supervision for mobility/OOB    Equipment Recommendations  None recommended by PT    Recommendations for Other Services       Precautions / Restrictions Precautions Precautions: Fall      Mobility  Bed Mobility Overal bed mobility: Modified Independent             General bed mobility comments: increased time to perform  Transfers Overall transfer level: Needs assistance   Transfers: Sit to/from Stand Sit to Stand: Supervision         General transfer comment: supervision for safety, modest instability but no physical assist require  Ambulation/Gait Ambulation/Gait assistance: Min guard Ambulation Distance (Feet): 120 Feet Assistive device: 1 person hand held assist Gait Pattern/deviations: Step-through pattern;Decreased stride length;Drifts right/left;Narrow base of support (RLE externally rotated ) Gait velocity: decreased Gait velocity interpretation: Below normal speed for age/gender General Gait Details: some instability noted during ambulation, mutiple balance checks but patient was able to self correct, delayed righting response (VCs for increased cadence)  Stairs            Wheelchair Mobility    Modified Rankin (Stroke Patients Only)       Balance                                              Pertinent Vitals/Pain Pain Assessment: No/denies pain    Home Living Family/patient expects to be discharged to:: Private residence Living Arrangements: Spouse/significant other Available Help at Discharge: Family Type of Home: House Home Access: Stairs to enter Entrance Stairs-Rails: Can reach both Entrance Stairs-Number of Steps: 6 Home Layout: One level Home Equipment: Environmental consultant - 2 wheels;Cane - single point;Bedside commode      Prior Function Level of Independence: Independent               Hand Dominance   Dominant Hand: Right    Extremity/Trunk Assessment   Upper Extremity Assessment: Overall WFL for tasks assessed           Lower Extremity Assessment: Overall WFL for tasks assessed         Communication   Communication: No difficulties  Cognition Arousal/Alertness: Awake/alert Behavior During Therapy: WFL for tasks assessed/performed Overall Cognitive Status: Within Functional Limits for tasks assessed                      General Comments      Exercises        Assessment/Plan    PT Assessment Patient needs continued PT services  PT Diagnosis Difficulty walking;Abnormality of gait   PT Problem List Decreased activity tolerance;Decreased balance;Decreased mobility  PT Treatment Interventions DME instruction;Gait training;Stair training;Functional mobility training;Therapeutic activities;Therapeutic exercise;Balance training;Patient/family education   PT Goals (Current goals can be found in  the Care Plan section) Acute Rehab PT Goals Patient Stated Goal: to go home PT Goal Formulation: With patient Time For Goal Achievement: 01/23/16 Potential to Achieve Goals: Good    Frequency Min 3X/week   Barriers to discharge        Co-evaluation               End of Session Equipment Utilized During Treatment: Gait belt Activity Tolerance: Patient tolerated treatment well;Patient limited  by fatigue Patient left: in bed;with call bell/phone within reach;with bed alarm set Nurse Communication: Mobility status         Time: [id number] PT Time Calculation (min) (ACUTE ONLY): 18 min   Charges:   PT Evaluation $PT Eval Moderate Complexity: 1 Procedure     PT G CodesDuncan Dull 02/04/2016, 4:29 PM Alben Deeds, [place] DPT  [phone]

## 2016-01-09 NOTE — Progress Notes (Signed)
PROGRESS NOTE  BRONX MCINDOE [id number] DOB: September 24, 1941 DOA: 01/07/2016 PCP: Estill Dooms, MD  HPI/Recap of past 24 hours:  C/o persistent dry cough, no chest pain, no sob, reported feeling dizzy standing up, think he is on too much blood pressure meds, wife in room.  Assessment/Plan: Principal Problem:   Syncope Active Problems:   Essential hypertension   Hyperlipidemia   Glaucoma   BPH (benign prostatic hyperplasia)   Viral syndrome  Influenza B: on tamiflu, droplet precaution. cxr unremarkable, supportive care  Syncope  Per admitting MD, patient loss consciousness briefly, but patient denies today, per wife, patient had syncope episode three times last week, with brief loc. Ct head no acute findings, exam nonfocal. He is aaox3 today, troponin negative, sinus rhythm on tele, troponin negative, echocardiogram and carotid US no acute findings, orthostatic vital sign on 4/6after hydration was mildly abnormal with stable blood pressure but heart rate increased from 71 laying to 79 standing. Repeat orthostatic vital sign on 4/7 due to c/o feeling dizzy standing up. i have contacted cardiology cards master to set up outpatient cardiac monitor.  HTN: patient think he has been on too much blood pressure meds, sbp low normal range , will decrease avapro Continue hold hctz, clinically dry  Hypokalemia: replace k, mag 1.9  Code Status: full  Family Communication: patient and wife in room  Disposition Plan: home, possible 4/8   Consultants:  Cards master to set up outpatient cardiac monitoring for syncope work up  Procedures:  none  Antibiotics:  none   Objective: BP 107/62 mmHg  Pulse 66  Temp(Src) 99.4 F (37.4 C) (Oral)  Resp 16  Ht 6' (1.829 m)  Wt 86.5 kg (190 lb 11.2 oz)  BMI 25.86 kg/m2  SpO2 94%  Intake/Output Summary (Last 24 hours) at 01/09/16 1222 Last data filed at 01/09/16 1143  Gross per 24 hour  Intake 2396.67 ml  Output   2000 ml  Net  396.67 ml   Filed Weights   01/07/16 2100 01/08/16 0540 01/09/16 0048  Weight: 85.775 kg (189 lb 1.6 oz) 86.229 kg (190 lb 1.6 oz) 86.5 kg (190 lb 11.2 oz)    Exam:   General:  NAD  Cardiovascular: RRR  Respiratory: CTABL  Abdomen: Soft/ND/NT, positive BS  Musculoskeletal: No Edema  Neuro: aaox3  Data Reviewed: Basic Metabolic Panel:  Recent Labs Lab 01/06/16 1423 01/07/16 1653 01/07/16 2118 01/08/16 0527 01/09/16 0430  NA 132* 136  --  139 136  K 4.3 3.3*  --  3.6 4.3  CL 90* 99*  --  103 103  CO2 22 25  --  25 25  GLUCOSE 181* 142*  --  106* 87  BUN 12 10  --  9 5*  CREATININE 0.84 1.10  --  1.06 0.83  CALCIUM 9.0 8.8*  --  8.4* 8.2*  MG  --   --  2.3  --  1.9  PHOS  --   --  1.8*  --   --    Liver Function Tests:  Recent Labs Lab 01/06/16 1423  AST 43*  ALT 38  ALKPHOS 59  BILITOT 0.4  PROT 7.0  ALBUMIN 3.9   No results for input(s): LIPASE, AMYLASE in the last 168 hours. No results for input(s): AMMONIA in the last 168 hours. CBC:  Recent Labs Lab 01/07/16 1653  WBC 4.7  NEUTROABS 3.2  HGB 14.4  HCT 43.2  MCV 87.8  PLT 132*   Cardiac Enzymes:  Recent Labs Lab 01/07/16 2227 01/08/16 0527  TROPONINI <0.03 <0.03   BNP (last 3 results) No results for input(s): BNP in the last 8760 hours.  ProBNP (last 3 results) No results for input(s): PROBNP in the last 8760 hours.  CBG: No results for input(s): GLUCAP in the last 168 hours.  No results found for this or any previous visit (from the past 240 hour(s)).   Studies: No results found.  Scheduled Meds: . brimonidine  1 drop Both Eyes BID  . cholecalciferol  400 Units Oral Daily  . enoxaparin (LOVENOX) injection  40 mg Subcutaneous Q24H  . [START ON 01/10/2016] irbesartan  75 mg Oral Daily  . oseltamivir  75 mg Oral BID  . timolol  1 drop Right Eye Daily    Continuous Infusions: . 0.9 % NaCl with KCl 40 mEq / L 100 mL/hr (01/09/16 [id number])     Time spent:  12mins  Elie Gragert MD, PhD  [facility] Pager [phone]. If 7PM-7AM, please contact night-coverage at [email or web address], password [facility] 01/09/2016, 12:22 PM  LOS: 2 days

## 2016-01-10 ENCOUNTER — Other Ambulatory Visit: Payer: Self-pay | Admitting: Physician Assistant

## 2016-01-10 DIAGNOSIS — H409 Unspecified glaucoma: Secondary | ICD-10-CM | POA: Diagnosis not present

## 2016-01-10 DIAGNOSIS — R55 Syncope and collapse: Secondary | ICD-10-CM

## 2016-01-10 DIAGNOSIS — J111 Influenza due to unidentified influenza virus with other respiratory manifestations: Secondary | ICD-10-CM | POA: Diagnosis not present

## 2016-01-10 DIAGNOSIS — I10 Essential (primary) hypertension: Secondary | ICD-10-CM | POA: Diagnosis not present

## 2016-01-10 LAB — CBC
HCT: 40.5 % (ref 39.0–52.0)
Hemoglobin: 13.3 g/dL (ref 13.0–17.0)
MCH: 29 pg (ref 26.0–34.0)
MCHC: 32.8 g/dL (ref 30.0–36.0)
MCV: 88.2 fL (ref 78.0–100.0)
Platelets: 95 10*3/uL — ABNORMAL LOW (ref 150–400)
RBC: 4.59 MIL/uL (ref 4.22–5.81)
RDW: 14.1 % (ref 11.5–15.5)
WBC: 4.4 10*3/uL (ref 4.0–10.5)

## 2016-01-10 LAB — BASIC METABOLIC PANEL
Anion gap: 9 (ref 5–15)
BUN: <5 mg/dL — ABNORMAL LOW (ref 6–20)
CO2: 23 mmol/L (ref 22–32)
Calcium: 8.2 mg/dL — ABNORMAL LOW (ref 8.9–10.3)
Chloride: 105 mmol/L (ref 101–111)
Creatinine, Ser: 0.76 mg/dL (ref 0.61–1.24)
GFR calc Af Amer: >60 mL/min (ref >60)
GFR calc non Af Amer: >60 mL/min (ref >60)
Glucose, Bld: 89 mg/dL (ref 65–99)
Potassium: 4.4 mmol/L (ref 3.5–5.1)
Sodium: 137 mmol/L (ref 135–145)

## 2016-01-10 LAB — HEPATIC FUNCTION PANEL
ALT: 58 U/L (ref 17–63)
AST: 44 U/L — ABNORMAL HIGH (ref 15–41)
Albumin: 2.7 g/dL — ABNORMAL LOW (ref 3.5–5.0)
Alkaline Phosphatase: 42 U/L (ref 38–126)
Bilirubin, Direct: 0.1 mg/dL (ref 0.1–0.5)
Indirect Bilirubin: 0.4 mg/dL (ref 0.3–0.9)
Total Bilirubin: 0.5 mg/dL (ref 0.3–1.2)
Total Protein: 5.4 g/dL — ABNORMAL LOW (ref 6.5–8.1)

## 2016-01-10 MED ORDER — IRBESARTAN 75 MG PO TABS: 75.0000 mg | ORAL_TABLET | Freq: Every day | ORAL | Status: DC

## 2016-01-10 MED ORDER — GUAIFENESIN-DM 100-10 MG/5ML PO SYRP: 5.0000 mL | ORAL_SOLUTION | ORAL | Status: DC | PRN

## 2016-01-10 MED ORDER — OSELTAMIVIR PHOSPHATE 75 MG PO CAPS: ORAL_CAPSULE | ORAL | Status: DC

## 2016-01-10 NOTE — Discharge Summary (Signed)
Discharge Summary  Kirk [name] [id number] DOB: 1940-10-23  PCP: Estill Dooms, MD  Admit date: 01/07/2016 Discharge date: 01/10/2016  Time spent: <21mins  Recommendations for Outpatient Follow-up:  1. F/u with PMD within a week  for hospital discharge follow up, repeat cbc/bmp at follow up. 2. F/u with cardiology for outpatient cardiac monitor 3. New medication: tamiflu, d/c  Irbesartan/hctz 150/12.5, changed irbesartn 75mg  po daily. Patient to monitor blood pressure at home further blood pressure meds changed per pmd.  Discharge Diagnoses:  Active Hospital Problems   Diagnosis Date Noted  . Syncope 01/07/2016  . Viral syndrome 01/07/2016  . Glaucoma   . Hyperlipidemia   . BPH (benign prostatic hyperplasia)   . Essential hypertension 02/01/2013    Resolved Hospital Problems   Diagnosis Date Noted Date Resolved  No resolved problems to display.    Discharge Condition: stable  Diet recommendation: heart healthy  Filed Weights   01/08/16 0540 01/09/16 0048 01/10/16 0419  Weight: 86.229 kg (190 lb 1.6 oz) 86.5 kg (190 lb 11.2 oz) 86.41 kg (190 lb 8 oz)    History of present illness:  Kirk [name] is a 75 y.o. male with a past medical history hypertension, hyperlipidemia, congenital ago,, diverticulosis, hyperglycemia who was brought to the emergency department via EMS due to loss of consciousness.  Per patient, he went to see his doctor yesterday due to having cough, nausea, vomiting, fatigue and body aches for since last week. He was prescribed Tamiflu and and went home, but was called from the clinic to come back today in the morning to get a repeat nasal swab. Prior to this, he went to eat with his wife, and while eating he suddenly passed out. Per wife, he was moving his extremities for about a minute, then woke up confused for a few minutes prior to his mentation returning to baseline. He denies chest pain, palpitations, diaphoresis, pitting edema of the lower  extremities, dyspnea, but complains of dizziness. Patient has been having at least 2-3 episodes of emesis daily for the past 2 days. He denies diarrhea, constipation, melena or hematochezia. He denies GU symptoms.  Hospital Course:  Principal Problem:   Syncope Active Problems:   Essential hypertension   Hyperlipidemia   Glaucoma   BPH (benign prostatic hyperplasia)   Viral syndrome  Influenza B: on tamiflu, droplet precaution. cxr unremarkable, supportive care  Syncope Per admitting MD, patient loss consciousness briefly, but patient denies today, per wife, patient had syncope episode three times last week, with brief loc. Ct head no acute findings, exam nonfocal. He is aaox3 today, troponin negative, sinus rhythm on tele, troponin negative, echocardiogram and carotid US no acute findings, orthostatic vital sign on 4/6after hydration was mildly abnormal with stable blood pressure but heart rate increased from 71 laying to 79 standing. Repeat orthostatic vital sign on 4/7 due to c/o feeling dizzy standing up. i have contacted cardiology cards master to set up outpatient cardiac monitor.  HTN: patient think he has been on too much blood pressure meds, sbp low normal range , will decrease avapro Continue hold hctz, clinically dry Discharged on decreased dose avapro. D/c hctz.  Hypokalemia: replace k, mag 1.9  Code Status: full  Family Communication: patient  Disposition Plan: home on 4/8   Consultants:  Cards master to set up outpatient cardiac monitoring for syncope work up  Procedures:  none  Antibiotics:  none   Discharge Exam: BP 135/70 mmHg  Pulse 63  Temp(Src) 98.5 F (  36.9 C) (Oral)  Resp 18  Ht 6' (1.829 m)  Wt 86.41 kg (190 lb 8 oz)  BMI 25.83 kg/m2  SpO2 96%   General: NAD  Cardiovascular: RRR  Respiratory: CTABL  Abdomen: Soft/ND/NT, positive BS  Musculoskeletal: No Edema  Neuro: aaox3   Discharge Instructions You were cared for by a  hospitalist during your hospital stay. If you have any questions about your discharge medications or the care you received while you were in the hospital after you are discharged, you can call the unit and asked to speak with the hospitalist on call if the hospitalist that took care of you is not available. Once you are discharged, your primary care physician will handle any further medical issues. Please note that NO REFILLS for any discharge medications will be authorized once you are discharged, as it is imperative that you return to your primary care physician (or establish a relationship with a primary care physician if you do not have one) for your aftercare needs so that they can reassess your need for medications and monitor your lab values.      Discharge Instructions    Diet - low sodium heart healthy    Complete by:  As directed      Increase activity slowly    Complete by:  As directed             Medication List    STOP taking these medications        irbesartan-hydrochlorothiazide 150-12.5 MG tablet  Commonly known as:  AVALIDE      TAKE these medications        brimonidine 0.1 % Soln  Commonly known as:  ALPHAGAN P  Place 1 drop into both eyes 2 (two) times daily.     cholecalciferol 400 units Tabs tablet  Commonly known as:  VITAMIN D  Take 400 Units by mouth daily.     guaiFENesin-dextromethorphan 100-10 MG/5ML syrup  Commonly known as:  ROBITUSSIN DM  Take 5 mLs by mouth every 4 (four) hours as needed for cough.     irbesartan 75 MG tablet  Commonly known as:  AVAPRO  Take 1 tablet (75 mg total) by mouth daily.     oseltamivir 75 MG capsule  Commonly known as:  TAMIFLU  Take one capsule twice daily to treat the flu     promethazine 25 MG tablet  Commonly known as:  PHENERGAN  Take 1 tablet (25 mg total) by mouth every 8 (eight) hours as needed for nausea or vomiting.     timolol 0.5 % ophthalmic solution  Commonly known as:  TIMOPTIC  Place 1 drop  into the right eye daily.     TYLENOL 325 MG tablet  Generic drug:  acetaminophen  Take 325 mg by mouth daily as needed (pain). One tablet once daily as needed for pain       Allergies  Allergen Reactions  . Reserpine Other (See Comments)    Unknown reaction  . Sulfa Antibiotics Other (See Comments)    Patient denies - Unknown reaction   Follow-up Information    Follow up with GREEN, Viviann Spare, MD In 1 week.   Specialty:  Internal Medicine   Why:  hospital discharge follow up   Contact information:   [place] [address] [phone]       Follow up with [facility] In 1 week.   Specialty:  Cardiology   Why:  Office  scheduler will contact you to arrange 30 day event monitor for passing out spell, please give [place] a call if you do not hear from [place] in 3 business days   Contact information:   [address], [place] [phone]       The results of significant diagnostics from this hospitalization (including imaging, microbiology, ancillary and laboratory) are listed below for reference.    Significant Diagnostic Studies: Dg Chest 2 View  01/07/2016  CLINICAL DATA:  Cough for 3 weeks. EXAM: CHEST - 2 VIEW COMPARISON:  12/15/2010 FINDINGS: The heart size and mediastinal contours are within normal limits. There is no evidence of pulmonary edema, consolidation, pneumothorax, nodule or pleural fluid. The visualized skeletal structures are unremarkable. IMPRESSION: No active disease. Electronically Signed   By: Aletta Edouard M.D.   On: 01/07/2016 16:07   Ct Head Wo Contrast  01/07/2016  CLINICAL DATA:  Syncope versus seizure EXAM: CT HEAD WITHOUT CONTRAST TECHNIQUE: Contiguous axial images were obtained from the base of the skull through the vertex without intravenous contrast. COMPARISON:  CT head 12/15/2010 FINDINGS: Ventricle size normal. Mild atrophy. Mild chronic microvascular ischemic change in the  white matter. Negative for acute infarct. Negative for hemorrhage or mass lesion. No shift of the midline structures. Calvarium is normal.  Mild mucosal edema in the paranasal sinuses. IMPRESSION: Atrophy and chronic microvascular ischemia.  No acute abnormality. Electronically Signed   By: Franchot Gallo M.D.   On: 01/07/2016 16:40    Microbiology: No results found for this or any previous visit (from the past 240 hour(s)).   Labs: Basic Metabolic Panel:  Recent Labs Lab 01/06/16 1423 01/07/16 1653 01/07/16 2118 01/08/16 0527 01/09/16 0430 01/10/16 0404  NA 132* 136  --  139 136 137  K 4.3 3.3*  --  3.6 4.3 4.4  CL 90* 99*  --  103 103 105  CO2 22 25  --  25 25 23   GLUCOSE 181* 142*  --  106* 87 89  BUN 12 10  --  9 5* <5*  CREATININE 0.84 1.10  --  1.06 0.83 0.76  CALCIUM 9.0 8.8*  --  8.4* 8.2* 8.2*  MG  --   --  2.3  --  1.9  --   PHOS  --   --  1.8*  --   --   --    Liver Function Tests:  Recent Labs Lab 01/06/16 1423 01/10/16 0404  AST 43* 44*  ALT 38 58  ALKPHOS 59 42  BILITOT 0.4 0.5  PROT 7.0 5.4*  ALBUMIN 3.9 2.7*   No results for input(s): LIPASE, AMYLASE in the last 168 hours. No results for input(s): AMMONIA in the last 168 hours. CBC:  Recent Labs Lab 01/07/16 1653 01/10/16 0404  WBC 4.7 4.4  NEUTROABS 3.2  --   HGB 14.4 13.3  HCT 43.2 40.5  MCV 87.8 88.2  PLT 132* 95*   Cardiac Enzymes:  Recent Labs Lab 01/07/16 2227 01/08/16 0527  TROPONINI <0.03 <0.03   BNP: BNP (last 3 results) No results for input(s): BNP in the last 8760 hours.  ProBNP (last 3 results) No results for input(s): PROBNP in the last 8760 hours.  CBG: No results for input(s): GLUCAP in the last 168 hours.     SignedFlorencia Reasons MD, PhD  [facility] 01/10/2016, 11:34 AM

## 2016-01-15 ENCOUNTER — Ambulatory Visit (INDEPENDENT_AMBULATORY_CARE_PROVIDER_SITE_OTHER): Payer: PPO

## 2016-01-15 DIAGNOSIS — R55 Syncope and collapse: Secondary | ICD-10-CM

## 2016-02-17 ENCOUNTER — Encounter: Payer: Self-pay | Admitting: Cardiology

## 2016-02-17 ENCOUNTER — Ambulatory Visit (INDEPENDENT_AMBULATORY_CARE_PROVIDER_SITE_OTHER): Payer: PPO | Admitting: Cardiology

## 2016-02-17 VITALS — BP 124/76 | HR 96 | Ht 72.0 in | Wt 193.8 lb

## 2016-02-17 DIAGNOSIS — R55 Syncope and collapse: Secondary | ICD-10-CM

## 2016-02-17 DIAGNOSIS — I951 Orthostatic hypotension: Secondary | ICD-10-CM | POA: Insufficient documentation

## 2016-02-17 DIAGNOSIS — I10 Essential (primary) hypertension: Secondary | ICD-10-CM | POA: Diagnosis not present

## 2016-02-17 NOTE — Patient Instructions (Addendum)
Medication Instructions:  Your physician recommends that you continue on your current medications as directed. Please refer to the Current Medication list given to you today.   Labwork: None  Testing/Procedures: Your physician has requested that you have an echocardiogram. Echocardiography is a painless test that uses sound waves to create images of your heart. It provides your doctor with information about the size and shape of your heart and how well your heart's chambers and valves are working. This procedure takes approximately one hour. There are no restrictions for this procedure.  Follow-Up: Your physician recommends that you schedule a follow-up appointment AS NEEDED with Dr. Turner pending study results.  Any Other Special Instructions Will Be Listed Below (If Applicable).     If you need a refill on your cardiac medications before your next appointment, please call your pharmacy.   

## 2016-02-17 NOTE — Progress Notes (Signed)
Cardiology Office Note    Date:  02/17/2016   ID:  Kirk [name], DOB 1941/02/08, MRN [id number]  PCP:  Estill Dooms, MD  Cardiologist:  Fransico Him, MD   Chief Complaint  Patient presents with  . Chest Pain    pt states no chest pain   . Shortness of Breath    no SOB     History of Present Illness:  Kirk [name] is a 76 y.o. male with a history of HTN and dyslipidemia who presented to [facility] 01/07/2016 with a syncopal episode.  He had been having cough, nausea, vomiting and body aches for a week and was placed on Tamiflu.  He went out to eat with his wife and while eating passed out.  He awakened confused for a few minutes but denied any chest pain, SOB, diaphoresis, nausea or palpitations.  He did complain of dizziness.  He apparently had been having 2-3 episodes of vomiting for 2 days but no diarrhea.   According to hospital records, he had had 3 episodes of syncope that week.  Head CT was fine and troponin was negative x 3.  Carotid US showed no significant abnormality and 2D echo .  He was mildly orthostatic and BP soft.  His Avapro was decreased and diuretic stopped.  He is now referred for further evaluation of syncope.  It was recommended that he wear a 30 day heart monitor.  There is no family history of SCD or syncope or arrhythmias except for his father who had a PPM.     Past Medical History  Diagnosis Date  . Unspecified vitamin D deficiency   . Unspecified pruritic disorder   . Hyperglycemia   . Pain in limb   . Disturbance of skin sensation   . Nevus, non-neoplastic   . Urinary frequency   . Herpes zoster without mention of complication   . Cough   . Dysphagia, unspecified(787.20)   . Plantar fascial fibromatosis   . Other premature beats   . Hypertension   . Encounter for long-term (current) use of other medications   . Special screening for malignant neoplasm of prostate   . Diverticulum of esophagus, acquired   . Hypertrophy of prostate without urinary  obstruction and other lower urinary tract symptoms (LUTS)   . Unspecified glaucoma   . Lumbago   . Hyperlipidemia   . Zenker's diverticulum   . Diverticulosis of colon 2014  . Osteoarthrosis, unspecified whether generalized or localized, unspecified site     pt. denies  . Congenital glaucoma     "both eyes operated on when I was 35 months old"    Past Surgical History  Procedure Laterality Date  . Cataract extraction bilateral w/ anterior vitrectomy Bilateral 1977  . Hernia repair  1993  . Incisional hernia repair  2008  . Flexible sigmoidoscopy  1999  . Glaucoma surgery  1945    congenital glaucoma  . Laryngoscopy      with stapling of zenkers diverticulum  . Colonoscopy  09/06/2013    [name]  . Direct laryngoscopy  10/22/2015    Cervical esophagoscopy. Endoscopic esophageal diverticulotomy.     Current Medications: Outpatient Prescriptions Prior to Visit  Medication Sig Dispense Refill  . acetaminophen (TYLENOL) 325 MG tablet Take 325 mg by mouth as directed. One tablet once daily as needed for pain    . brimonidine (ALPHAGAN P) 0.1 % SOLN Place 1 drop into both eyes 2 (two) times daily.     Marland Kitchen  cholecalciferol (VITAMIN D) 400 units TABS tablet Take 400 Units by mouth daily.    . irbesartan (AVAPRO) 75 MG tablet Take 1 tablet (75 mg total) by mouth daily. 30 tablet 0  . timolol (TIMOPTIC) 0.5 % ophthalmic solution Place 1 drop into the right eye daily.     Marland Kitchen guaiFENesin-dextromethorphan (ROBITUSSIN DM) 100-10 MG/5ML syrup Take 5 mLs by mouth every 4 (four) hours as needed for cough. (Patient not taking: Reported on 02/17/2016) 118 mL 0  . oseltamivir (TAMIFLU) 75 MG capsule Take one capsule twice daily to treat the flu 10 capsule 0  . promethazine (PHENERGAN) 25 MG tablet Take 1 tablet (25 mg total) by mouth every 8 (eight) hours as needed for nausea or vomiting. 24 tablet 0   No facility-administered medications prior to visit.     Allergies:   Reserpine and Sulfa antibiotics    Social History   Social History  . Marital Status: Married    Spouse Name: N/A  . Number of Children: N/A  . Years of Education: N/A   Social History Main Topics  . Smoking status: Never Smoker   . Smokeless tobacco: Never Used  . Alcohol Use: No  . Drug Use: No  . Sexual Activity: Not Asked   Other Topics Concern  . None   Social History Narrative   Married   Lives at home with wife   No regular exercise, but does his own yard work and household chores.     Family History:  The patient's family history includes CAD in his father; Dementia in his mother; Hypertension in his mother; Kidney disease in his father. There is no history of Colon cancer, Throat cancer, Diabetes, or Liver disease.   ROS:   Please see the history of present illness.    ROS All other systems reviewed and are negative.   PHYSICAL EXAM:   VS:  BP 124/76 mmHg  Pulse 96  Ht 6' (1.829 m)  Wt 193 lb 12.8 oz (87.907 kg)  BMI 26.28 kg/m2  SpO2 97%   GEN: Well nourished, well developed, in no acute distress HEENT: normal Neck: no JVD, carotid bruits, or masses Cardiac: RRR; no murmurs, rubs, or gallops,no edema.  Intact distal pulses bilaterally.  Respiratory:  clear to auscultation bilaterally, normal work of breathing GI: soft, nontender, nondistended, + BS MS: no deformity or atrophy Skin: warm and dry, no rash Neuro:  Alert and Oriented x 3, Strength and sensation are intact Psych: euthymic mood, full affect  Wt Readings from Last 3 Encounters:  02/17/16 193 lb 12.8 oz (87.907 kg)  01/10/16 190 lb 8 oz (86.41 kg)  01/06/16 192 lb 6.4 oz (87.272 kg)      Studies/Labs Reviewed:   EKG:  EKG is not ordered today.    Recent Labs: 01/09/2016: Magnesium 1.9; TSH 4.531* 01/10/2016: ALT 58; BUN <5*; Creatinine, Ser 0.76; Hemoglobin 13.3; Platelets 95*; Potassium 4.4; Sodium 137   Lipid Panel    Component Value Date/Time   CHOL 129 01/06/2016 1423   TRIG 76 01/06/2016 1423   HDL 37*  01/06/2016 1423   CHOLHDL 3.5 01/06/2016 1423   LDLCALC 77 01/06/2016 1423    Additional studies/ records that were reviewed today include:  none    ASSESSMENT:    1. Syncope, unspecified syncope type   2. Essential hypertension      PLAN:  In order of problems listed above:  1. Syncope - this was in the setting of acute influenza infection  and he had had several days of vomiting.  I suspect this was due to dehydration as he was orthostatic in the hospital .. His orthostatic are normal today.  He has not had any further episodes of dizziness or syncope. 2D echo showed normal LVF but had no apical views so will repeat this.   Heart monitor showed sinus brady, NSR and sinus tach with heart rate 46 to 120bpm with no arrhythmias.   2. HTN - BP controlled on Avapro  No further cardiac workup at this time.  He was instructed to call if he has any further dizziness or syncope.     Medication Adjustments/Labs and Tests Ordered: Current medicines are reviewed at length with the patient today.  Concerns regarding medicines are outlined above.  Medication changes, Labs and Tests ordered today are listed in the Patient Instructions below.  There are no Patient Instructions on file for this visit.   Signed, Fransico Him, MD  02/17/2016 2:17 PM    [facility] [place], [place],   [address] Phone: [phone]; Fax: [phone]

## 2016-02-23 ENCOUNTER — Other Ambulatory Visit: Payer: PPO

## 2016-02-23 DIAGNOSIS — I10 Essential (primary) hypertension: Secondary | ICD-10-CM

## 2016-02-23 DIAGNOSIS — R739 Hyperglycemia, unspecified: Secondary | ICD-10-CM | POA: Diagnosis not present

## 2016-02-23 DIAGNOSIS — E785 Hyperlipidemia, unspecified: Secondary | ICD-10-CM

## 2016-02-24 LAB — LIPID PANEL
Chol/HDL Ratio: 4.3 ratio units (ref 0.0–5.0)
Cholesterol, Total: 162 mg/dL (ref 100–199)
HDL: 38 mg/dL — ABNORMAL LOW (ref >39)
LDL Calculated: 93 mg/dL (ref 0–99)
Triglycerides: 153 mg/dL — ABNORMAL HIGH (ref 0–149)
VLDL Cholesterol Cal: 31 mg/dL (ref 5–40)

## 2016-02-24 LAB — COMPREHENSIVE METABOLIC PANEL
ALT: 23 IU/L (ref 0–44)
AST: 21 IU/L (ref 0–40)
Albumin/Globulin Ratio: 1.6 (ref 1.2–2.2)
Albumin: 4.2 g/dL (ref 3.5–4.8)
Alkaline Phosphatase: 66 IU/L (ref 39–117)
BUN/Creatinine Ratio: 16 (ref 10–24)
BUN: 14 mg/dL (ref 8–27)
Bilirubin Total: 0.8 mg/dL (ref 0.0–1.2)
CO2: 23 mmol/L (ref 18–29)
Calcium: 9.5 mg/dL (ref 8.6–10.2)
Chloride: 98 mmol/L (ref 96–106)
Creatinine, Ser: 0.87 mg/dL (ref 0.76–1.27)
GFR calc Af Amer: 98 mL/min/{1.73_m2} (ref >59)
GFR calc non Af Amer: 85 mL/min/{1.73_m2} (ref >59)
Globulin, Total: 2.6 g/dL (ref 1.5–4.5)
Glucose: 100 mg/dL — ABNORMAL HIGH (ref 65–99)
Potassium: 4.2 mmol/L (ref 3.5–5.2)
Sodium: 139 mmol/L (ref 134–144)
Total Protein: 6.8 g/dL (ref 6.0–8.5)

## 2016-02-24 LAB — CBC WITH DIFFERENTIAL/PLATELET
Basophils Absolute: 0 10*3/uL (ref 0.0–0.2)
Basos: 1 %
EOS (ABSOLUTE): 0.5 10*3/uL — ABNORMAL HIGH (ref 0.0–0.4)
Eos: 8 %
Hematocrit: 45.1 % (ref 37.5–51.0)
Hemoglobin: 15.6 g/dL (ref 12.6–17.7)
Immature Grans (Abs): 0 10*3/uL (ref 0.0–0.1)
Immature Granulocytes: 0 %
Lymphocytes Absolute: 2.2 10*3/uL (ref 0.7–3.1)
Lymphs: 34 %
MCH: 30.5 pg (ref 26.6–33.0)
MCHC: 34.6 g/dL (ref 31.5–35.7)
MCV: 88 fL (ref 79–97)
Monocytes Absolute: 0.7 10*3/uL (ref 0.1–0.9)
Monocytes: 11 %
Neutrophils Absolute: 3 10*3/uL (ref 1.4–7.0)
Neutrophils: 46 %
Platelets: 194 10*3/uL (ref 150–379)
RBC: 5.11 x10E6/uL (ref 4.14–5.80)
RDW: 14.6 % (ref 12.3–15.4)
WBC: 6.5 10*3/uL (ref 3.4–10.8)

## 2016-02-24 LAB — TSH: TSH: 6.42 u[IU]/mL — ABNORMAL HIGH (ref 0.450–4.500)

## 2016-02-25 ENCOUNTER — Ambulatory Visit (INDEPENDENT_AMBULATORY_CARE_PROVIDER_SITE_OTHER): Payer: PPO | Admitting: Internal Medicine

## 2016-02-25 ENCOUNTER — Encounter: Payer: Self-pay | Admitting: Internal Medicine

## 2016-02-25 VITALS — BP 134/86 | HR 70 | Temp 97.6°F | Ht 72.0 in | Wt 194.0 lb

## 2016-02-25 DIAGNOSIS — R739 Hyperglycemia, unspecified: Secondary | ICD-10-CM

## 2016-02-25 DIAGNOSIS — R946 Abnormal results of thyroid function studies: Secondary | ICD-10-CM

## 2016-02-25 DIAGNOSIS — E785 Hyperlipidemia, unspecified: Secondary | ICD-10-CM

## 2016-02-25 DIAGNOSIS — I10 Essential (primary) hypertension: Secondary | ICD-10-CM

## 2016-02-25 DIAGNOSIS — R55 Syncope and collapse: Secondary | ICD-10-CM

## 2016-02-25 DIAGNOSIS — R7989 Other specified abnormal findings of blood chemistry: Secondary | ICD-10-CM

## 2016-02-25 NOTE — Progress Notes (Signed)
Patient ID: Kirk [name], male   DOB: Jun 28, 1941, 75 y.o.   MRN: [id number]    Facility  [facility]    Place of Service:   OFFICE    Allergies  Allergen Reactions  . Reserpine Other (See Comments)    Unknown reaction  . Sulfa Antibiotics Other (See Comments)    Patient denies - Unknown reaction    Chief Complaint  Patient presents with  . Medical Management of Chronic Issues    6 month medication management blood pressure, cholesterol, hyperglycermia, review labs. Here with wife.Irbesartan 75 mg given while in hospital, ran out started back on 150 mg about 16 days.    HPI:  Patient was hospitalized after his last office visit. Hospitalization dates were 01/07/2016 through 01/10/2016. Patient had a syncopal episode. Patient's blood pressure was low and he had a bradycardia with a rate in the 40s, he had been vomiting, and probably passed out as result of low blood pressure. At the time he left the hospital he was advised to stop his hydrochlorothiazide and irbesartan was reduced 75 mg. He was continued on Tamiflu for presumed flu.  He was seen by cardiologist, Fransico Him, M.D., on 02/17/2016. Blood pressure at that time was 124/76. He was continued on the lower dose of Avapro 75 mg daily.  Patient ran out of his 75 mg Avapro so started back on the 150 mg dose 16 days ago. Blood pressure is elevated today at 158/96. Home blood pressures been running in the 130s over 80s.  Note that there is a slight rise in his TSH compared to previous values.  Hyperglycemia - stable @ 101 mg%  Hyperlipidemia - controlled  Syncope, unspecified syncope type - no further episodes    Medications: Patient's Medications  New Prescriptions   No medications on file  Previous Medications   ACETAMINOPHEN (TYLENOL) 325 MG TABLET    Take 325 mg by mouth as directed. One tablet once daily as needed for pain   BRIMONIDINE (ALPHAGAN P) 0.1 % SOLN    Place 1 drop into both eyes 2 (two) times daily.    CHOLECALCIFEROL (VITAMIN D) 400 UNITS TABS TABLET    Take 400 Units by mouth daily.   IRBESARTAN (AVAPRO) 75 MG TABLET    Take 1 tablet (75 mg total) by mouth daily.   TIMOLOL (TIMOPTIC) 0.5 % OPHTHALMIC SOLUTION    Place 1 drop into the right eye daily.   Modified Medications   No medications on file  Discontinued Medications   No medications on file    Review of Systems  Constitutional: Negative for fever, chills, diaphoresis, activity change, appetite change, fatigue and unexpected weight change.  HENT: Negative for ear pain, hearing loss, mouth sores, nosebleeds, rhinorrhea, sinus pressure and sore throat.   Eyes: Positive for visual disturbance (hx glaucoma.).  Respiratory: Positive for cough. Negative for choking, shortness of breath, wheezing and stridor.   Cardiovascular: Negative for chest pain, palpitations and leg swelling.  Gastrointestinal: Positive for nausea. Negative for abdominal pain, diarrhea, constipation, blood in stool, abdominal distention and rectal pain.       History of esophageal diverticulum and mild dysphagia.  Endocrine: Negative for cold intolerance, heat intolerance, polydipsia, polyphagia and polyuria.  Genitourinary: Negative for dysuria, urgency, frequency, flank pain, decreased urine volume, enuresis and difficulty urinating.       History of BPH  Musculoskeletal: Negative for myalgias, back pain, joint swelling, arthralgias, gait problem and neck pain.  Skin: Negative for color change, pallor  and rash.  Allergic/Immunologic: Negative for environmental allergies, food allergies and immunocompromised state.  Neurological: Positive for numbness (to vibration and monofilament tests) and headaches. Negative for dizziness, tremors, seizures, syncope, facial asymmetry, speech difficulty, weakness and light-headedness.  Hematological: Negative.   Psychiatric/Behavioral: Negative for suicidal ideas, hallucinations, behavioral problems, confusion, sleep  disturbance, self-injury, dysphoric mood, decreased concentration and agitation. The patient is not nervous/anxious and is not hyperactive.     Filed Vitals:   02/25/16 1117  BP: 158/96  Pulse: 70  Temp: 97.6 F (36.4 C)  TempSrc: Oral  Height: 6' (1.829 m)  Weight: 194 lb (87.998 kg)  SpO2: 98%   Body mass index is 26.31 kg/(m^2). Filed Weights   02/25/16 1117  Weight: 194 lb (87.998 kg)     Physical Exam  Constitutional: He is oriented to person, place, and time. He appears well-developed and well-nourished. No distress.  Mildly overweight  HENT:  Head: Normocephalic and atraumatic.  Right Ear: External ear normal.  Left Ear: External ear normal.  Nose: Nose normal.  Some missing teeth.  Eyes: Conjunctivae and EOM are normal.  Abnormal pupils that are small and eccentric. Corrective lenses.  Neck: No JVD present. No tracheal deviation present. No thyromegaly present.  Cardiovascular: Normal rate, regular rhythm, normal heart sounds and intact distal pulses.  Exam reveals no gallop and no friction rub.   No murmur heard. Pulmonary/Chest: No respiratory distress. He has no wheezes. He has no rales.  Abdominal: He exhibits no distension and no mass. There is no tenderness. There is no rebound.  Genitourinary: Prostate normal and penis normal. Guaiac negative stool. No penile tenderness.  Musculoskeletal: Normal range of motion. He exhibits no edema or tenderness.  Lymphadenopathy:    He has no cervical adenopathy.  Neurological: He is alert and oriented to person, place, and time. He has normal reflexes. No cranial nerve deficit. Coordination normal.  Absent vibratory and monofilament sensations  Skin:  Multiple SK and common nevi.  Psychiatric: He has a normal mood and affect. His behavior is normal. Judgment and thought content normal.    Labs reviewed: Lab Summary Latest Ref Rng 02/23/2016 01/10/2016  Hemoglobin 12.6 - 17.7 g/dL 15.6 13.3  Hematocrit 37.5 - 51.0 %  45.1 40.5  White count 3.4 - 10.8 x10E3/uL 6.5 4.4  Platelet count 150 - 379 x10E3/uL 194 95(L)  Sodium 134 - 144 mmol/L 139 137  Potassium 3.5 - 5.2 mmol/L 4.2 4.4  Calcium 8.6 - 10.2 mg/dL 9.5 8.2(L)  Phosphorus - (None) (None)  Creatinine 0.76 - 1.27 mg/dL 0.87 0.76  AST 0 - 40 IU/L 21 44(H)  Alk Phos 39 - 117 IU/L 66 42  Bilirubin 0.0 - 1.2 mg/dL 0.8 0.5  Glucose 65 - 99 mg/dL 100(H) 89  Cholesterol - (None) (None)  HDL cholesterol >39 mg/dL 38(L) (None)  Triglycerides 0 - 149 mg/dL 153(H) (None)  LDL Direct - (None) (None)  LDL Calc 0 - 99 mg/dL 93 (None)  Total protein 6.5 - 8.1 g/dL (None) 5.4(L)  Albumin 3.5 - 4.8 g/dL 4.2 2.7(L)   Lab Results  Component Value Date   TSH 6.420* 02/23/2016   TSH 4.531* 01/09/2016   Lab Results  Component Value Date   BUN 14 02/23/2016   BUN <5* 01/10/2016   BUN 5* 01/09/2016   Lab Results  Component Value Date   HGBA1C 6.1* 08/05/2014   HGBA1C 6.3* 07/30/2013    Assessment/Plan  1. Essential hypertension Adequately controlled - Basic metabolic panel; Future  2. Hyperglycemia Controlled on diet alone - Basic metabolic panel; Future  3. Hyperlipidemia Controlled - Lipid panel; Future  4. Syncope, unspecified syncope type no further episodes. Patient is scheduled to get a 2-D echocardiogram in June 2017.  5. TSH elevation Continue to monitor. I did not add medication today.  - TSH; Future

## 2016-03-04 ENCOUNTER — Other Ambulatory Visit: Payer: Self-pay

## 2016-03-04 ENCOUNTER — Ambulatory Visit (HOSPITAL_COMMUNITY): Payer: PPO | Attending: Cardiology

## 2016-03-04 ENCOUNTER — Other Ambulatory Visit: Payer: Self-pay | Admitting: Cardiology

## 2016-03-04 DIAGNOSIS — E785 Hyperlipidemia, unspecified: Secondary | ICD-10-CM | POA: Diagnosis not present

## 2016-03-04 DIAGNOSIS — R55 Syncope and collapse: Secondary | ICD-10-CM | POA: Diagnosis not present

## 2016-03-04 DIAGNOSIS — I340 Nonrheumatic mitral (valve) insufficiency: Secondary | ICD-10-CM | POA: Insufficient documentation

## 2016-03-04 DIAGNOSIS — I452 Bifascicular block: Secondary | ICD-10-CM | POA: Insufficient documentation

## 2016-03-04 DIAGNOSIS — I10 Essential (primary) hypertension: Secondary | ICD-10-CM | POA: Insufficient documentation

## 2016-03-18 DIAGNOSIS — H401133 Primary open-angle glaucoma, bilateral, severe stage: Secondary | ICD-10-CM | POA: Diagnosis not present

## 2016-05-26 NOTE — Addendum Note (Signed)
Addended by: Moshe Cipro Jazilyn Siegenthaler A on: 05/26/2016 09:12 AM   Modules accepted: Orders

## 2016-07-09 ENCOUNTER — Other Ambulatory Visit: Payer: PPO

## 2016-07-09 DIAGNOSIS — R739 Hyperglycemia, unspecified: Secondary | ICD-10-CM | POA: Diagnosis not present

## 2016-07-09 DIAGNOSIS — I10 Essential (primary) hypertension: Secondary | ICD-10-CM

## 2016-07-09 DIAGNOSIS — R946 Abnormal results of thyroid function studies: Secondary | ICD-10-CM | POA: Diagnosis not present

## 2016-07-09 DIAGNOSIS — E785 Hyperlipidemia, unspecified: Secondary | ICD-10-CM | POA: Diagnosis not present

## 2016-07-09 DIAGNOSIS — R7989 Other specified abnormal findings of blood chemistry: Secondary | ICD-10-CM

## 2016-07-09 LAB — BASIC METABOLIC PANEL
BUN: 13 mg/dL (ref 7–25)
CO2: 28 mmol/L (ref 20–31)
Calcium: 9.6 mg/dL (ref 8.6–10.3)
Chloride: 101 mmol/L (ref 98–110)
Creat: 1 mg/dL (ref 0.70–1.18)
Glucose, Bld: 99 mg/dL (ref 65–99)
Potassium: 4.3 mmol/L (ref 3.5–5.3)
Sodium: 137 mmol/L (ref 135–146)

## 2016-07-09 LAB — LIPID PANEL
Cholesterol: 163 mg/dL (ref 125–200)
HDL: 36 mg/dL — ABNORMAL LOW (ref >=40)
LDL Cholesterol: 86 mg/dL (ref <130)
Total CHOL/HDL Ratio: 4.5 Ratio (ref <=5.0)
Triglycerides: 207 mg/dL — ABNORMAL HIGH (ref <150)
VLDL: 41 mg/dL — ABNORMAL HIGH (ref <30)

## 2016-07-09 LAB — TSH: TSH: 5.54 mIU/L — ABNORMAL HIGH (ref 0.40–4.50)

## 2016-07-13 ENCOUNTER — Encounter: Payer: Self-pay | Admitting: Internal Medicine

## 2016-07-13 ENCOUNTER — Ambulatory Visit (INDEPENDENT_AMBULATORY_CARE_PROVIDER_SITE_OTHER): Payer: PPO | Admitting: Internal Medicine

## 2016-07-13 VITALS — BP 152/94 | HR 78 | Temp 98.0°F | Ht 72.0 in | Wt 196.0 lb

## 2016-07-13 DIAGNOSIS — Z23 Encounter for immunization: Secondary | ICD-10-CM | POA: Diagnosis not present

## 2016-07-13 DIAGNOSIS — E785 Hyperlipidemia, unspecified: Secondary | ICD-10-CM | POA: Diagnosis not present

## 2016-07-13 DIAGNOSIS — I10 Essential (primary) hypertension: Secondary | ICD-10-CM

## 2016-07-13 DIAGNOSIS — R739 Hyperglycemia, unspecified: Secondary | ICD-10-CM

## 2016-07-13 NOTE — Progress Notes (Signed)
Facility  [facility]    Place of Service:   OFFICE    Allergies  Allergen Reactions  . Reserpine Other (See Comments)    Unknown reaction  . Sulfa Antibiotics Other (See Comments)    Patient denies - Unknown reaction    Chief Complaint  Patient presents with  . Medical Management of Chronic Issues    BP    HPI:  Essential hypertension  Hyperglycemia  Hyperlipidemia, unspecified hyperlipidemia type  Encounter for immunization - Plan: Flu Vaccine QUAD 36+ mos IM    Medications: Patient's Medications  New Prescriptions   No medications on file  Previous Medications   ACETAMINOPHEN (TYLENOL) 325 MG TABLET    Take 325 mg by mouth as directed. One tablet once daily as needed for pain   BRIMONIDINE (ALPHAGAN P) 0.1 % SOLN    Place 1 drop into both eyes 2 (two) times daily.    CHOLECALCIFEROL (VITAMIN D) 400 UNITS TABS TABLET    Take 400 Units by mouth daily.   IRBESARTAN-HYDROCHLOROTHIAZIDE (AVALIDE) 150-12.5 MG TABLET    Take one tablet daily for blood pressure.   TIMOLOL (TIMOPTIC) 0.5 % OPHTHALMIC SOLUTION    Place 1 drop into the right eye daily.   Modified Medications   No medications on file  Discontinued Medications   IRBESARTAN (AVAPRO) 75 MG TABLET    Take 1 tablet (75 mg total) by mouth daily.    Review of Systems  Constitutional: Negative for activity change, appetite change, chills, diaphoresis, fatigue, fever and unexpected weight change.  HENT: Negative for ear pain, hearing loss, mouth sores, nosebleeds, rhinorrhea, sinus pressure and sore throat.   Eyes: Positive for visual disturbance (hx glaucoma.).  Respiratory: Positive for cough. Negative for choking, shortness of breath, wheezing and stridor.   Cardiovascular: Negative for chest pain, palpitations and leg swelling.  Gastrointestinal: Positive for nausea. Negative for abdominal distention, abdominal pain, blood in stool, constipation, diarrhea and rectal pain.       History of esophageal diverticulum  and mild dysphagia.  Endocrine: Negative for cold intolerance, heat intolerance, polydipsia, polyphagia and polyuria.  Genitourinary: Negative for decreased urine volume, difficulty urinating, dysuria, enuresis, flank pain, frequency and urgency.       History of BPH  Musculoskeletal: Negative for arthralgias, back pain, gait problem, joint swelling, myalgias and neck pain.  Skin: Negative for color change, pallor and rash.  Allergic/Immunologic: Negative for environmental allergies, food allergies and immunocompromised state.  Neurological: Positive for numbness (to vibration and monofilament tests) and headaches. Negative for dizziness, tremors, seizures, syncope, facial asymmetry, speech difficulty, weakness and light-headedness.  Hematological: Negative.   Psychiatric/Behavioral: Negative for agitation, behavioral problems, confusion, decreased concentration, dysphoric mood, hallucinations, self-injury, sleep disturbance and suicidal ideas. The patient is not nervous/anxious and is not hyperactive.     Vitals:   07/13/16 1408  BP: (!) 152/94  Pulse: 78  Temp: 98 F (36.7 C)  TempSrc: Oral  SpO2: 97%  Weight: 196 lb (88.9 kg)  Height: 6' (1.829 m)   Body mass index is 26.58 kg/m. Wt Readings from Last 3 Encounters:  07/13/16 196 lb (88.9 kg)  02/25/16 194 lb (88 kg)  02/17/16 193 lb 12.8 oz (87.9 kg)      Physical Exam  Constitutional: He is oriented to person, place, and time. He appears well-developed and well-nourished. No distress.  Mildly overweight  HENT:  Head: Normocephalic and atraumatic.  Right Ear: External ear normal.  Left Ear: External ear normal.  Nose:  Nose normal.  Some missing teeth.  Eyes: Conjunctivae and EOM are normal.  Abnormal pupils that are small and eccentric. Corrective lenses.  Neck: No JVD present. No tracheal deviation present. No thyromegaly present.  Cardiovascular: Normal rate, regular rhythm, normal heart sounds and intact distal  pulses.  Exam reveals no gallop and no friction rub.   No murmur heard. Pulmonary/Chest: No respiratory distress. He has no wheezes. He has no rales.  Abdominal: He exhibits no distension and no mass. There is no tenderness. There is no rebound.  Genitourinary: Prostate normal and penis normal. Rectal exam shows guaiac negative stool. No penile tenderness.  Musculoskeletal: Normal range of motion. He exhibits no edema or tenderness.  Lymphadenopathy:    He has no cervical adenopathy.  Neurological: He is alert and oriented to person, place, and time. He has normal reflexes. No cranial nerve deficit. Coordination normal.  Absent vibratory and monofilament sensations  Skin:  Multiple SK and common nevi.  Psychiatric: He has a normal mood and affect. His behavior is normal. Judgment and thought content normal.    Labs reviewed: Lab Summary Latest Ref Rng & Units 07/09/2016 02/23/2016 01/10/2016  Hemoglobin 12.6 - 17.7 g/dL (None) 15.6 13.3  Hematocrit 37.5 - 51.0 % (None) 45.1 40.5  White count 3.4 - 10.8 x10E3/uL (None) 6.5 4.4  Platelet count 150 - 379 x10E3/uL (None) 194 95(L)  Sodium 135 - 146 mmol/L 137 139 137  Potassium 3.5 - 5.3 mmol/L 4.3 4.2 4.4  Calcium 8.6 - 10.3 mg/dL 9.6 9.5 8.2(L)  Phosphorus - (None) (None) (None)  Creatinine 0.70 - 1.18 mg/dL 1.00 0.87 0.76  AST 0 - 40 IU/L (None) 21 44(H)  Alk Phos 39 - 117 IU/L (None) 66 42  Bilirubin 0.0 - 1.2 mg/dL (None) 0.8 0.5  Glucose 65 - 99 mg/dL 99 100(H) 89  Cholesterol 125 - 200 mg/dL 163 (None) (None)  HDL cholesterol >=40 mg/dL 36(L) 38(L) (None)  Triglycerides <150 mg/dL 207(H) 153(H) (None)  LDL Direct - (None) (None) (None)  LDL Calc <130 mg/dL 86 93 (None)  Total protein 6.5 - 8.1 g/dL (None) (None) 5.4(L)  Albumin 3.5 - 4.8 g/dL (None) 4.2 2.7(L)  Some recent data might be hidden   Lab Results  Component Value Date   TSH 5.54 (H) 07/09/2016   TSH 6.420 (H) 02/23/2016   TSH 4.531 (H) 01/09/2016   Lab Results    Component Value Date   BUN 13 07/09/2016   BUN 14 02/23/2016   BUN <5 (L) 01/10/2016   Lab Results  Component Value Date   HGBA1C 6.1 (H) 08/05/2014   HGBA1C 6.3 (H) 07/30/2013    Assessment/Plan 1. Essential hypertension Continue current medication - Basic metabolic panel; Future  2. Hyperglycemia Diet controlled - Hemoglobin A1c; Future - Basic metabolic panel; Future - Microalbumin, urine; Future  3. Hyperlipidemia, unspecified hyperlipidemia type - Lipid panel; Future

## 2016-07-22 DIAGNOSIS — H04123 Dry eye syndrome of bilateral lacrimal glands: Secondary | ICD-10-CM | POA: Diagnosis not present

## 2016-07-22 DIAGNOSIS — H02401 Unspecified ptosis of right eyelid: Secondary | ICD-10-CM | POA: Diagnosis not present

## 2016-07-22 DIAGNOSIS — H401133 Primary open-angle glaucoma, bilateral, severe stage: Secondary | ICD-10-CM | POA: Diagnosis not present

## 2016-07-29 ENCOUNTER — Telehealth: Payer: Self-pay | Admitting: Internal Medicine

## 2016-07-29 NOTE — Telephone Encounter (Signed)
LM for pt to sched AWV w/[facility] in Nov; j.strack °

## 2016-08-01 ENCOUNTER — Other Ambulatory Visit: Payer: Self-pay | Admitting: Internal Medicine

## 2016-09-22 ENCOUNTER — Telehealth: Payer: Self-pay | Admitting: Internal Medicine

## 2016-09-22 NOTE — Telephone Encounter (Signed)
spoke w/ pt's wife to schedule AWV but she said she will have him call back after the first of the year to schedule. VDM (DD)

## 2016-10-28 DIAGNOSIS — H02401 Unspecified ptosis of right eyelid: Secondary | ICD-10-CM | POA: Diagnosis not present

## 2016-10-28 DIAGNOSIS — H401133 Primary open-angle glaucoma, bilateral, severe stage: Secondary | ICD-10-CM | POA: Diagnosis not present

## 2016-10-28 DIAGNOSIS — H04123 Dry eye syndrome of bilateral lacrimal glands: Secondary | ICD-10-CM | POA: Diagnosis not present

## 2016-10-28 DIAGNOSIS — H43393 Other vitreous opacities, bilateral: Secondary | ICD-10-CM | POA: Diagnosis not present

## 2016-11-04 DIAGNOSIS — H35433 Paving stone degeneration of retina, bilateral: Secondary | ICD-10-CM | POA: Diagnosis not present

## 2016-11-04 DIAGNOSIS — H43813 Vitreous degeneration, bilateral: Secondary | ICD-10-CM | POA: Diagnosis not present

## 2016-11-04 DIAGNOSIS — H33322 Round hole, left eye: Secondary | ICD-10-CM | POA: Diagnosis not present

## 2016-11-04 DIAGNOSIS — Q150 Congenital glaucoma: Secondary | ICD-10-CM | POA: Diagnosis not present

## 2016-12-04 ENCOUNTER — Other Ambulatory Visit: Payer: Self-pay | Admitting: Internal Medicine

## 2016-12-16 DIAGNOSIS — H33322 Round hole, left eye: Secondary | ICD-10-CM | POA: Diagnosis not present

## 2016-12-16 DIAGNOSIS — H43813 Vitreous degeneration, bilateral: Secondary | ICD-10-CM | POA: Diagnosis not present

## 2016-12-16 DIAGNOSIS — H35433 Paving stone degeneration of retina, bilateral: Secondary | ICD-10-CM | POA: Diagnosis not present

## 2016-12-16 DIAGNOSIS — Q150 Congenital glaucoma: Secondary | ICD-10-CM | POA: Diagnosis not present

## 2016-12-22 ENCOUNTER — Ambulatory Visit (INDEPENDENT_AMBULATORY_CARE_PROVIDER_SITE_OTHER): Payer: PPO

## 2016-12-22 VITALS — BP 158/82 | HR 79 | Temp 97.7°F | Ht 72.0 in | Wt 200.0 lb

## 2016-12-22 DIAGNOSIS — Z0000 Encounter for general adult medical examination without abnormal findings: Secondary | ICD-10-CM | POA: Diagnosis not present

## 2016-12-22 NOTE — Progress Notes (Signed)
Quick Notes   Health Maintenance: Pt got PN23 (2/2) today    Abnormal Screen: MMSE 29/30. Passed clock drawing. BP 1st time 160/78. 2nd time 158/82.      Patient Concerns: None     Nurse Concerns: None  I have reviewed the information entered by the Health Advisor. I was present in the office during the time of patient interaction and was available for consultation. I agree with the documentation and advice.  Viviann Spare Nyoka Cowden, MD

## 2016-12-22 NOTE — Patient Instructions (Signed)
Kirk [name] , Thank you for taking time to come for your Medicare Wellness Visit. I appreciate your ongoing commitment to your health goals. Please review the following plan we discussed and let me know if I can assist you in the future.   Screening recommendations/referrals: Colonoscopy due in 2024 Recommended yearly ophthalmology/optometry visit for glaucoma screening and checkup Recommended yearly dental visit for hygiene and checkup  Vaccinations: Influenza vaccine up to date Pneumococcal vaccine gave 2/2 today. Up to date Tdap vaccine due in 2024 Shingles vaccine due. You can call pharmacy or insurance to figure out coverage. If it's something you decide you want call us and we will put in order for you.  Advanced directives: Advance directive discussed with you today. I have provided a copy for you to complete at home and have notarized. Once this is complete please bring a copy in to our office so we can scan it into your chart.  Conditions/risks identified: BP was high  Next appointment: Dr. Nyoka Cowden 4/10 @ 2:30pm  Preventive Care 75 Years and Older, Male Preventive care refers to lifestyle choices and visits with your health care provider that can promote health and wellness. What does preventive care include?  A yearly physical exam. This is also called an annual well check.  Dental exams once or twice a year.  Routine eye exams. Ask your health care provider how often you should have your eyes checked.  Personal lifestyle choices, including:  Daily care of your teeth and gums.  Regular physical activity.  Eating a healthy diet.  Avoiding tobacco and drug use.  Limiting alcohol use.  Practicing safe sex.  Taking low doses of aspirin every day.  Taking vitamin and mineral supplements as recommended by your health care provider. What happens during an annual well check? The services and screenings done by your health care provider during your annual well check will  depend on your age, overall health, lifestyle risk factors, and family history of disease. Counseling  Your health care provider may ask you questions about your:  Alcohol use.  Tobacco use.  Drug use.  Emotional well-being.  Home and relationship well-being.  Sexual activity.  Eating habits.  History of falls.  Memory and ability to understand (cognition).  Work and work Statistician. Screening  You may have the following tests or measurements:  Height, weight, and BMI.  Blood pressure.  Lipid and cholesterol levels. These may be checked every 5 years, or more frequently if you are over 17 years old.  Skin check.  Lung cancer screening. You may have this screening every year starting at age 75 if you have a 30-pack-year history of smoking and currently smoke or have quit within the past 15 years.  Fecal occult blood test (FOBT) of the stool. You may have this test every year starting at age 75  Flexible sigmoidoscopy or colonoscopy. You may have a sigmoidoscopy every 5 years or a colonoscopy every 10 years starting at age 75  Prostate cancer screening. Recommendations will vary depending on your family history and other risks.  Hepatitis C blood test.  Hepatitis B blood test.  Sexually transmitted disease (STD) testing.  Diabetes screening. This is done by checking your blood sugar (glucose) after you have not eaten for a while (fasting). You may have this done every 1-3 years.  Abdominal aortic aneurysm (AAA) screening. You may need this if you are a current or former smoker.  Osteoporosis. You may be screened starting at age 75 if  you are at high risk. Talk with your health care provider about your test results, treatment options, and if necessary, the need for more tests. Vaccines  Your health care provider may recommend certain vaccines, such as:  Influenza vaccine. This is recommended every year.  Tetanus, diphtheria, and acellular pertussis (Tdap,  Td) vaccine. You may need a Td booster every 10 years.  Zoster vaccine. You may need this after age 75.  Pneumococcal 13-valent conjugate (PCV13) vaccine. One dose is recommended after age 75.  Pneumococcal polysaccharide (PPSV23) vaccine. One dose is recommended after age 75. Talk to your health care provider about which screenings and vaccines you need and how often you need them. This information is not intended to replace advice given to you by your health care provider. Make sure you discuss any questions you have with your health care provider. Document Released: 10/17/2015 Document Revised: 06/09/2016 Document Reviewed: 07/22/2015 Elsevier Interactive Patient Education  2017 [employer] Prevention in the Home Falls can cause injuries. They can happen to people of all ages. There are many things you can do to make your home safe and to help prevent falls. What can I do on the outside of my home?  Regularly fix the edges of walkways and driveways and fix any cracks.  Remove anything that might make you trip as you walk through a door, such as a raised step or threshold.  Trim any bushes or trees on the path to your home.  Use bright outdoor lighting.  Clear any walking paths of anything that might make someone trip, such as rocks or tools.  Regularly check to see if handrails are loose or broken. Make sure that both sides of any steps have handrails.  Any raised decks and porches should have guardrails on the edges.  Have any leaves, snow, or ice cleared regularly.  Use sand or salt on walking paths during winter.  Clean up any spills in your garage right away. This includes oil or grease spills. What can I do in the bathroom?  Use night lights.  Install grab bars by the toilet and in the tub and shower. Do not use towel bars as grab bars.  Use non-skid mats or decals in the tub or shower.  If you need to sit down in the shower, use a plastic, non-slip  stool.  Keep the floor dry. Clean up any water that spills on the floor as soon as it happens.  Remove soap buildup in the tub or shower regularly.  Attach bath mats securely with double-sided non-slip rug tape.  Do not have throw rugs and other things on the floor that can make you trip. What can I do in the bedroom?  Use night lights.  Make sure that you have a light by your bed that is easy to reach.  Do not use any sheets or blankets that are too big for your bed. They should not hang down onto the floor.  Have a firm chair that has side arms. You can use this for support while you get dressed.  Do not have throw rugs and other things on the floor that can make you trip. What can I do in the kitchen?  Clean up any spills right away.  Avoid walking on wet floors.  Keep items that you use a lot in easy-to-reach places.  If you need to reach something above you, use a strong step stool that has a grab bar.  Keep electrical cords  out of the way.  Do not use floor polish or wax that makes floors slippery. If you must use wax, use non-skid floor wax.  Do not have throw rugs and other things on the floor that can make you trip. What can I do with my stairs?  Do not leave any items on the stairs.  Make sure that there are handrails on both sides of the stairs and use them. Fix handrails that are broken or loose. Make sure that handrails are as long as the stairways.  Check any carpeting to make sure that it is firmly attached to the stairs. Fix any carpet that is loose or worn.  Avoid having throw rugs at the top or bottom of the stairs. If you do have throw rugs, attach them to the floor with carpet tape.  Make sure that you have a light switch at the top of the stairs and the bottom of the stairs. If you do not have them, ask someone to add them for you. What else can I do to help prevent falls?  Wear shoes that:  Do not have high heels.  Have rubber bottoms.  Are  comfortable and fit you well.  Are closed at the toe. Do not wear sandals.  If you use a stepladder:  Make sure that it is fully opened. Do not climb a closed stepladder.  Make sure that both sides of the stepladder are locked into place.  Ask someone to hold it for you, if possible.  Clearly mark and make sure that you can see:  Any grab bars or handrails.  First and last steps.  Where the edge of each step is.  Use tools that help you move around (mobility aids) if they are needed. These include:  Canes.  Walkers.  Scooters.  Crutches.  Turn on the lights when you go into a dark area. Replace any light bulbs as soon as they burn out.  Set up your furniture so you have a clear path. Avoid moving your furniture around.  If any of your floors are uneven, fix them.  If there are any pets around you, be aware of where they are.  Review your medicines with your doctor. Some medicines can make you feel dizzy. This can increase your chance of falling. Ask your doctor what other things that you can do to help prevent falls. This information is not intended to replace advice given to you by your health care provider. Make sure you discuss any questions you have with your health care provider. Document Released: 07/17/2009 Document Revised: 02/26/2016 Document Reviewed: 10/25/2014 Elsevier Interactive Patient Education  2017 [employer].

## 2016-12-22 NOTE — Progress Notes (Signed)
Subjective:   Kirk [name] is a 76 y.o. male who presents for an Initial Medicare Annual Wellness Visit.    Objective:    Today's Vitals   12/22/16 1344  BP: (!) 158/82  Pulse: 79  Temp: 97.7 F (36.5 C)  TempSrc: Oral  SpO2: 97%  Weight: 200 lb (90.7 kg)  Height: 6' (1.829 m)   Body mass index is 27.12 kg/m.  Current Medications (verified) Outpatient Encounter Prescriptions as of 12/22/2016  Medication Sig  . acetaminophen (TYLENOL) 325 MG tablet Take 325 mg by mouth as directed. One tablet once daily as needed for pain  . brimonidine (ALPHAGAN P) 0.1 % SOLN Place 1 drop into both eyes 2 (two) times daily.   . cholecalciferol (VITAMIN D) 400 units TABS tablet Take 400 Units by mouth daily.  . irbesartan-hydrochlorothiazide (AVALIDE) 150-12.5 MG tablet TAKE ONE TABLET DAILY TO CONTROL BLOOD PRESSURE  . timolol (TIMOPTIC) 0.5 % ophthalmic solution Place 1 drop into the right eye daily.    No facility-administered encounter medications on file as of 12/22/2016.     Allergies (verified) Reserpine and Sulfa antibiotics   History: Past Medical History:  Diagnosis Date  . Congenital glaucoma    "both eyes operated on when I was 42 months old"  . Cough   . Disturbance of skin sensation   . Diverticulosis of colon 2014  . Diverticulum of esophagus, acquired   . Dysphagia, unspecified(787.20)   . Encounter for long-term (current) use of other medications   . Herpes zoster without mention of complication   . Hyperglycemia   . Hyperlipidemia   . Hypertension   . 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 Lumbago   . Nevus, non-neoplastic   . Osteoarthrosis, unspecified whether generalized or localized, unspecified site    pt. denies  . Other premature beats   . Pain in limb   . Plantar fascial fibromatosis   . Special screening for malignant neoplasm of prostate   . Unspecified glaucoma(365.9)   .  Unspecified pruritic disorder   . Unspecified vitamin D deficiency   . Urinary frequency   . Zenker's diverticulum    Past Surgical History:  Procedure Laterality Date  . CATARACT EXTRACTION BILATERAL W/ ANTERIOR VITRECTOMY Bilateral 1977  . COLONOSCOPY  09/06/2013   Kirk [name]  . DIRECT LARYNGOSCOPY  10/22/2015   Cervical esophagoscopy. Endoscopic esophageal diverticulotomy.   Marland Kitchen Kirk [name]  . GLAUCOMA SURGERY  1945   congenital glaucoma  . HERNIA REPAIR  1993  . Kirk [name]  2008  . LARYNGOSCOPY     with stapling of zenkers diverticulum   Family History  Problem Relation Age of Onset  . Hypertension Mother   . Dementia Mother   . Kidney disease Father   . CAD Father   . Colon cancer Neg Hx   . Throat cancer Neg Hx   . Diabetes Neg Hx   . Liver disease Neg Hx    Social History   Occupational History  . Not on file.   Social History Main Topics  . Smoking status: Never Smoker  . Smokeless tobacco: Never Used  . Alcohol use No  . Drug use: No  . Sexual activity: Not on file   Tobacco Counseling Counseling given: Not Answered   Activities of Daily Living In your present state of health, do you have any difficulty performing the following activities: 12/22/2016 01/07/2016  Hearing? Kirk [name]  N  Vision? N N  Difficulty concentrating or making decisions? N N  Walking or climbing stairs? N Y  Dressing or bathing? N N  Doing errands, shopping? N Y  Conservation officer, nature and eating ? N -  Using the Toilet? N -  In the past six months, have you accidently leaked urine? N -  Do you have problems with loss of bowel control? N -  Managing your Medications? N -  Managing your Finances? N -  Housekeeping or managing your Housekeeping? N -  Some recent data might be hidden    Immunizations and Health Maintenance Immunization History  Administered Date(s) Administered  . Influenza,inj,Quad PF,36+ Mos 07/13/2016  . Pneumococcal Conjugate-13 01/12/2012  . Td  03/05/2011   Health Maintenance Due  Topic Date Due  . PNA vac Low Risk Adult (2 of 2 - PPSV23) 01/11/2013    Patient Care Team: Estill Dooms, MD as PCP - General (Internal Medicine) Calvert Cantor, MD as Consulting Physician (Ophthalmology) Lafayette Dragon, MD (Inactive) as Consulting Physician (Gastroenterology) Allyn Kenner, MD (Dermatology) Malka So, MD (Inactive) as Attending Physician (General Surgery) Jodi Marble, MD as Consulting Physician (Otolaryngology)  Indicate any recent Medical Services you may have received from other than [facility] providers in the past year (date may be approximate).    Assessment:   This is a routine wellness examination for Kirk [name].  Hearing/Vision screen Hearing Screening Comments: Dr Jodene Nam checked ears a couple years ago  Vision Screening Comments: Congential glaucoma. Cataracts removed in 1977. Last month was last vision screen, goes q 4 months   Dietary issues and exercise activities discussed: Current Exercise Habits: Home exercise routine, Type of exercise: walking;Other - see comments (working in yard), Time (Minutes): 30, Frequency (Times/Week): 3, Weekly Exercise (Minutes/Week): 90  Goals    None     Depression Screen PHQ 2/9 Scores 12/22/2016 07/13/2016 08/20/2015 02/05/2015  PHQ - 2 Score 0 0 0 0    Fall Risk Fall Risk  12/22/2016 07/13/2016 02/25/2016 08/20/2015 02/05/2015  Falls in the past year? No No Yes No No  Number falls in past yr: - - 2 or more - -  Injury with Fall? - - No - -    Cognitive Function: MMSE - Mini Mental State Exam 12/22/2016 08/20/2015  Orientation to time 5 4  Orientation to Place 5 5  Registration 3 3  Attention/ Calculation 5 5  Recall 3 1  Language- name 2 objects 2 2  Language- repeat 1 1  Language- follow 3 step command 3 3  Language- read & follow direction 1 1  Write a sentence 1 1  Copy design 1 1  Total score 30 27        Screening Tests Health Maintenance  Topic Date Due  . PNA  vac Low Risk Adult (2 of 2 - PPSV23) 01/11/2013  . TETANUS/TDAP  03/04/2021  . COLONOSCOPY  09/07/2023  . INFLUENZA VACCINE  Completed        Plan:    I have personally reviewed and addressed the Medicare Annual Wellness questionnaire and have noted the following in the patient's chart:  A. Medical and social history B. Use of alcohol, tobacco or illicit drugs  C. Current medications and supplements D. Functional ability and status E.  Nutritional status F.  Physical activity G. Advance directives H. List of other physicians I.  Hospitalizations, surgeries, and ER visits in previous 12 months J.  Vitals K. Screenings to include hearing, vision,  cognitive, depression L. Referrals and appointments - none  In addition, I have reviewed and discussed with patient certain preventive protocols, quality metrics, and best practice recommendations. A written personalized care plan for preventive services as well as general preventive health recommendations were provided to patient.  See attached scanned questionnaire for additional information.   Signed,   Rich Reining, RN Nurse Health Advisor   I have reviewed the information entered by the [facility]. I was present in the office during the time of patient interaction and was available for consultation. I agree with the documentation and advice.  Viviann Spare Nyoka Cowden, MD

## 2017-01-06 ENCOUNTER — Other Ambulatory Visit: Payer: PPO

## 2017-01-06 DIAGNOSIS — R739 Hyperglycemia, unspecified: Secondary | ICD-10-CM | POA: Diagnosis not present

## 2017-01-06 DIAGNOSIS — I10 Essential (primary) hypertension: Secondary | ICD-10-CM

## 2017-01-06 DIAGNOSIS — E785 Hyperlipidemia, unspecified: Secondary | ICD-10-CM

## 2017-01-06 LAB — BASIC METABOLIC PANEL
BUN: 15 mg/dL (ref 7–25)
CO2: 27 mmol/L (ref 20–31)
Calcium: 9.5 mg/dL (ref 8.6–10.3)
Chloride: 101 mmol/L (ref 98–110)
Creat: 0.94 mg/dL (ref 0.70–1.18)
Glucose, Bld: 96 mg/dL (ref 65–99)
Potassium: 4.3 mmol/L (ref 3.5–5.3)
Sodium: 137 mmol/L (ref 135–146)

## 2017-01-06 LAB — LIPID PANEL
Cholesterol: 168 mg/dL (ref <200)
HDL: 38 mg/dL — ABNORMAL LOW (ref >40)
LDL Cholesterol: 93 mg/dL (ref <100)
Total CHOL/HDL Ratio: 4.4 Ratio (ref <5.0)
Triglycerides: 187 mg/dL — ABNORMAL HIGH (ref <150)
VLDL: 37 mg/dL — ABNORMAL HIGH (ref <30)

## 2017-01-07 LAB — MICROALBUMIN, URINE: Microalb, Ur: 0.7 mg/dL

## 2017-01-07 LAB — HEMOGLOBIN A1C
Hgb A1c MFr Bld: 5.8 % — ABNORMAL HIGH (ref <5.7)
Mean Plasma Glucose: 120 mg/dL

## 2017-01-11 ENCOUNTER — Encounter: Payer: Self-pay | Admitting: Internal Medicine

## 2017-01-11 ENCOUNTER — Ambulatory Visit (INDEPENDENT_AMBULATORY_CARE_PROVIDER_SITE_OTHER): Payer: PPO | Admitting: Internal Medicine

## 2017-01-11 VITALS — BP 124/88 | HR 82 | Temp 97.4°F | Ht 72.0 in | Wt 201.0 lb

## 2017-01-11 DIAGNOSIS — E785 Hyperlipidemia, unspecified: Secondary | ICD-10-CM

## 2017-01-11 DIAGNOSIS — R739 Hyperglycemia, unspecified: Secondary | ICD-10-CM

## 2017-01-11 DIAGNOSIS — I452 Bifascicular block: Secondary | ICD-10-CM

## 2017-01-11 DIAGNOSIS — Q809 Congenital ichthyosis, unspecified: Secondary | ICD-10-CM | POA: Diagnosis not present

## 2017-01-11 DIAGNOSIS — E559 Vitamin D deficiency, unspecified: Secondary | ICD-10-CM | POA: Diagnosis not present

## 2017-01-11 DIAGNOSIS — N400 Enlarged prostate without lower urinary tract symptoms: Secondary | ICD-10-CM | POA: Diagnosis not present

## 2017-01-11 DIAGNOSIS — R946 Abnormal results of thyroid function studies: Secondary | ICD-10-CM | POA: Diagnosis not present

## 2017-01-11 DIAGNOSIS — G629 Polyneuropathy, unspecified: Secondary | ICD-10-CM | POA: Diagnosis not present

## 2017-01-11 DIAGNOSIS — I10 Essential (primary) hypertension: Secondary | ICD-10-CM | POA: Diagnosis not present

## 2017-01-11 DIAGNOSIS — R7989 Other specified abnormal findings of blood chemistry: Secondary | ICD-10-CM

## 2017-01-11 DIAGNOSIS — R1310 Dysphagia, unspecified: Secondary | ICD-10-CM | POA: Diagnosis not present

## 2017-01-11 NOTE — Progress Notes (Signed)
Patient ID: Kirk [name], male   DOB: [birth year], 76 y.o.   MRN: [id number]    HISTORY AND PHYSICAL  Location:       Place of Service:     Extended Emergency Contact Information Primary Emergency Contact: Fedora,Barbara Address: [address]          [place], [place] [address] [place] Phone: [phone] Relation: Spouse  Advanced Directive information Does Patient Have a Medical Advance Directive?: No  Chief Complaint  Patient presents with  . Annual Exam    Physical  . 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 Cough   . Disturbance of skin sensation   . Diverticulosis of colon 2014  . Diverticulum of esophagus, acquired   . Dysphagia, unspecified(787.20)   . Encounter for long-term (current) use of other medications   . Herpes zoster without mention of complication   . Hyperglycemia   . Hyperlipidemia   . Hypertension   . 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 Lumbago   . Nevus, non-neoplastic   . Osteoarthrosis, unspecified whether generalized or localized, unspecified site    pt. denies  . Other premature beats   . Pain in limb   . Plantar fascial fibromatosis   . Special screening for malignant neoplasm of prostate   . Unspecified  glaucoma(365.9)   . Unspecified pruritic disorder   . Unspecified vitamin D deficiency   . Urinary frequency   . Zenker's diverticulum     Past Surgical History:  Procedure Laterality Date  . CATARACT EXTRACTION BILATERAL W/ ANTERIOR VITRECTOMY Bilateral 1977  . COLONOSCOPY  09/06/2013   [name]  . DIRECT LARYNGOSCOPY  10/22/2015   Cervical esophagoscopy. Endoscopic esophageal diverticulotomy.   Marland Kitchen [place]  . GLAUCOMA SURGERY  1945   congenital glaucoma  . HERNIA REPAIR  1993  . [place]  2008  . LARYNGOSCOPY     with stapling of zenkers diverticulum    Patient Care Team: Estill Dooms, MD as PCP - General (Internal Medicine) Calvert Cantor, MD as Consulting Physician (Ophthalmology) Lafayette Dragon, MD (Inactive) as Consulting Physician (Gastroenterology) Allyn Kenner, MD (Dermatology) Malka So, MD (Inactive) as Attending Physician (General Surgery) Jodi Marble, MD as Consulting Physician (Otolaryngology)  Social History   Social History  . Marital status: Married    Spouse name: N/A  . Number of children: N/A  . Years of education: N/A   Occupational History  . Not on file.   Social History Main Topics  . Smoking status: Never Smoker  . Smokeless tobacco: Never Used  . Alcohol use No  . Drug use: No  . Sexual activity: Not on file   Other Topics Concern  . Not on file   Social History Narrative   Married  Lives at home with wife   No regular exercise, but does his own yard work and household chores.   Never smoked   Alcohol none    reports that he has never smoked. He has never used smokeless tobacco. He reports that he does not drink alcohol or use drugs.  Family History  Problem Relation Age of Onset  . Hypertension Mother   . Dementia Mother   . Kidney disease Father   . CAD Father   . Colon cancer Neg Hx   . Throat cancer Neg Hx   . Diabetes Neg Hx   . Liver disease Neg Hx    Family Status  Relation  Status  . Mother Deceased  . Father Deceased  . Brother Deceased   stillborn  . Neg Hx     Immunization History  Administered Date(s) Administered  . Influenza,inj,Quad PF,36+ Mos 07/13/2016  . Pneumococcal Conjugate-13 01/12/2012  . Td 03/05/2011    Allergies  Allergen Reactions  . Reserpine Other (See Comments)    Unknown reaction  . Sulfa Antibiotics Other (See Comments)    Patient denies - Unknown reaction    Medications: Patient's Medications  New Prescriptions   No medications on file  Previous Medications   ACETAMINOPHEN (TYLENOL) 325 MG TABLET    Take 325 mg by mouth as directed. One tablet once daily as needed for pain   BRIMONIDINE (ALPHAGAN P) 0.1 % SOLN    Place 1 drop into both eyes 2 (two) times daily.    CHOLECALCIFEROL (VITAMIN D) 400 UNITS TABS TABLET    Take 400 Units by mouth daily.   IRBESARTAN-HYDROCHLOROTHIAZIDE (AVALIDE) 150-12.5 MG TABLET    TAKE ONE TABLET DAILY TO CONTROL BLOOD PRESSURE   TIMOLOL (TIMOPTIC) 0.5 % OPHTHALMIC SOLUTION    Place 1 drop into the right eye daily.   Modified Medications   No medications on file  Discontinued Medications   No medications on file    Review of Systems  Constitutional: Negative for activity change, appetite change, chills, diaphoresis, fatigue, fever and unexpected weight change.  HENT: Negative for ear pain, hearing loss, mouth sores, nosebleeds, rhinorrhea, sinus pressure and sore throat.   Eyes: Positive for visual disturbance (hx glaucoma.).  Respiratory: Positive for cough. Negative for choking, shortness of breath, wheezing and stridor.   Cardiovascular: Negative for chest pain, palpitations and leg swelling.  Gastrointestinal: Positive for nausea. Negative for abdominal distention, abdominal pain, blood in stool, constipation, diarrhea and rectal pain.       History of esophageal diverticulum and mild dysphagia.  Endocrine: Negative for cold intolerance, heat intolerance, polydipsia, polyphagia and  polyuria.  Genitourinary: Negative for decreased urine volume, difficulty urinating, dysuria, enuresis, flank pain, frequency and urgency.       History of BPH  Musculoskeletal: Negative for arthralgias, back pain, gait problem, joint swelling, myalgias and neck pain.  Skin: Negative for color change, pallor and rash.  Allergic/Immunologic: Negative for environmental allergies, food allergies and immunocompromised state.  Neurological: Positive for numbness (to vibration and monofilament tests) and headaches. Negative for dizziness, tremors, seizures, syncope, facial asymmetry, speech difficulty, weakness and light-headedness.  Hematological: Negative.   Psychiatric/Behavioral: Negative for agitation, behavioral problems, confusion, decreased concentration, dysphoric mood, hallucinations, self-injury, sleep disturbance and suicidal ideas. The patient is not nervous/anxious and is not hyperactive.     Vitals:   01/11/17 1433  BP: 124/88  Pulse: 82  Temp: 97.4 F (36.3 C)  TempSrc: Oral  SpO2: 94%  Weight: 201 lb (  91.2 kg)  Height: 6' (1.829 m)   Body mass index is 27.26 kg/m. Filed Weights   01/11/17 1433  Weight: 201 lb (91.2 kg)   Wt Readings from Last 3 Encounters:  01/11/17 201 lb (91.2 kg)  12/22/16 200 lb (90.7 kg)  07/13/16 196 lb (88.9 kg)     Physical Exam  Constitutional: He is oriented to person, place, and time. He appears well-developed and well-nourished. No distress.  Mildly overweight  HENT:  Head: Normocephalic and atraumatic.  Right Ear: External ear normal.  Left Ear: External ear normal.  Nose: Nose normal.  Some missing teeth.  Eyes: Conjunctivae and EOM are normal.  Abnormal pupils that are small and eccentric. Corrective lenses.  Neck: No JVD present. No tracheal deviation present. No thyromegaly present.  Cardiovascular: Normal rate, regular rhythm, normal heart sounds and intact distal pulses.  Exam reveals no gallop and no friction rub.   No  murmur heard. Pulmonary/Chest: No respiratory distress. He has no wheezes. He has no rales.  Abdominal: He exhibits no distension and no mass. There is no tenderness. There is no rebound.  Genitourinary: Prostate normal and penis normal. Rectal exam shows guaiac negative stool. No penile tenderness.  Musculoskeletal: Normal range of motion. He exhibits no edema or tenderness.  Lymphadenopathy:    He has no cervical adenopathy.  Neurological: He is alert and oriented to person, place, and time. He has normal reflexes. No cranial nerve deficit. Coordination normal.  Absent vibratory and monofilament sensations  Skin:  Multiple SK and common nevi.  Psychiatric: He has a normal mood and affect. His behavior is normal. Judgment and thought content normal.    Labs reviewed: Lab Summary Latest Ref Rng & Units 01/06/2017 07/09/2016 02/23/2016  Hemoglobin 12.6 - 17.7 g/dL (None) (None) 15.6  Hematocrit 37.5 - 51.0 % (None) (None) 45.1  White count 3.4 - 10.8 x10E3/uL (None) (None) 6.5  Platelet count 150 - 379 x10E3/uL (None) (None) 194  Sodium 135 - 146 mmol/L 137 137 139  Potassium 3.5 - 5.3 mmol/L 4.3 4.3 4.2  Calcium 8.6 - 10.3 mg/dL 9.5 9.6 9.5  Phosphorus - (None) (None) (None)  Creatinine 0.70 - 1.18 mg/dL 0.94 1.00 0.87  AST 0 - 40 IU/L (None) (None) 21  Alk Phos 39 - 117 IU/L (None) (None) 66  Bilirubin 0.0 - 1.2 mg/dL (None) (None) 0.8  Glucose 65 - 99 mg/dL 96 99 100(H)  Cholesterol <200 mg/dL 168 163 (None)  HDL cholesterol >40 mg/dL 38(L) 36(L) 38(L)  Triglycerides <150 mg/dL 187(H) 207(H) 153(H)  LDL Direct - (None) (None) (None)  LDL Calc <100 mg/dL 93 86 93  Total protein - (None) (None) (None)  Albumin 3.5 - 4.8 g/dL (None) (None) 4.2  Some recent data might be hidden   Lab Results  Component Value Date   BUN 15 01/06/2017   Lab Results  Component Value Date   HGBA1C 5.8 (H) 01/06/2017   Lab Results  Component Value Date   TSH 5.54 (H) 07/09/2016     01/11/17  EKG: rate 93. NSR. Abnormal LAD. R-S transition zone in V leads displaced to the right. LAFB. ST and T abnormalities, consider high lateral ischemia or left ventricular strain. No significant change from 2012.   Assessment/Plan  1. Essential hypertension The current medical regimen is effective;  continue present plan and medications. - EKG 12-Lead - Comprehensive metabolic panel; Future  2. Hyperglycemia controlled - Hemoglobin A1c; Future  3. Hyperlipidemia, unspecified hyperlipidemia type The current  medical regimen is effective;  continue present plan and medications. - Lipid panel; Future  4. Dysphagia, unspecified type improved  5. Neuropathy ([place]) unchanged  6. Vitamin D deficiency stable  7. Xeroderma Try lubricating  8. TSH elevation - TSH; Future  9. Bifascicular block unchanged  10. Benign prostatic hyperplasia without lower urinary tract symptoms unchanged

## 2017-02-17 DIAGNOSIS — H43393 Other vitreous opacities, bilateral: Secondary | ICD-10-CM | POA: Diagnosis not present

## 2017-02-17 DIAGNOSIS — H04123 Dry eye syndrome of bilateral lacrimal glands: Secondary | ICD-10-CM | POA: Diagnosis not present

## 2017-02-17 DIAGNOSIS — H02401 Unspecified ptosis of right eyelid: Secondary | ICD-10-CM | POA: Diagnosis not present

## 2017-02-17 DIAGNOSIS — H401133 Primary open-angle glaucoma, bilateral, severe stage: Secondary | ICD-10-CM | POA: Diagnosis not present

## 2017-03-25 ENCOUNTER — Ambulatory Visit (INDEPENDENT_AMBULATORY_CARE_PROVIDER_SITE_OTHER): Payer: PPO | Admitting: Family Medicine

## 2017-03-25 ENCOUNTER — Encounter: Payer: Self-pay | Admitting: Family Medicine

## 2017-03-25 VITALS — BP 130/78 | HR 83 | Temp 98.2°F | Ht 72.0 in | Wt 196.8 lb

## 2017-03-25 DIAGNOSIS — R05 Cough: Secondary | ICD-10-CM | POA: Diagnosis not present

## 2017-03-25 DIAGNOSIS — Z7689 Persons encountering health services in other specified circumstances: Secondary | ICD-10-CM | POA: Diagnosis not present

## 2017-03-25 DIAGNOSIS — J069 Acute upper respiratory infection, unspecified: Secondary | ICD-10-CM

## 2017-03-25 DIAGNOSIS — R059 Cough, unspecified: Secondary | ICD-10-CM

## 2017-03-25 MED ORDER — HYDROCODONE-ACETAMINOPHEN 7.5-325 MG/15ML PO SOLN: 10.0000 mL | Freq: Four times a day (QID) | ORAL | 0 refills | Status: DC | PRN

## 2017-03-25 NOTE — Patient Instructions (Addendum)
Please let me know if cough not better in 1 week or if you develop any fever/chills, shortness of breath or more drainage from your eyes  Cough, Adult Coughing is a reflex that clears your throat and your airways. Coughing helps to heal and protect your lungs. It is normal to cough occasionally, but a cough that happens with other symptoms or lasts a long time may be a sign of a condition that needs treatment. A cough may last only 2-3 weeks (acute), or it may last longer than 8 weeks (chronic). What are the causes? Coughing is commonly caused by:  Breathing in substances that irritate your lungs.  A viral or bacterial respiratory infection.  Allergies.  Asthma.  Postnasal drip.  Smoking.  Acid backing up from the stomach into the esophagus (gastroesophageal reflux).  Certain medicines.  Chronic lung problems, including COPD (or rarely, lung cancer).  Other medical conditions such as heart failure.  Follow these instructions at home: Pay attention to any changes in your symptoms. Take these actions to help with your discomfort:  Take medicines only as told by your health care provider. ? If you were prescribed an antibiotic medicine, take it as told by your health care provider. Do not stop taking the antibiotic even if you start to feel better. ? Talk with your health care provider before you take a cough suppressant medicine.  Drink enough fluid to keep your urine clear or pale yellow.  If the air is dry, use a cold steam vaporizer or humidifier in your bedroom or your home to help loosen secretions.  Avoid anything that causes you to cough at work or at home.  If your cough is worse at night, try sleeping in a semi-upright position.  Avoid cigarette smoke. If you smoke, quit smoking. If you need help quitting, ask your health care provider.  Avoid caffeine.  Avoid alcohol.  Rest as needed.  Contact a health care provider if:  You have new symptoms.  You cough  up pus.  Your cough does not get better after 2-3 weeks, or your cough gets worse.  You cannot control your cough with suppressant medicines and you are losing sleep.  You develop pain that is getting worse or pain that is not controlled with pain medicines.  You have a fever.  You have unexplained weight loss.  You have night sweats. Get help right away if:  You cough up blood.  You have difficulty breathing.  Your heartbeat is very fast. This information is not intended to replace advice given to you by your health care provider. Make sure you discuss any questions you have with your health care provider. Document Released: 03/19/2011 Document Revised: 02/26/2016 Document Reviewed: 11/27/2014 Elsevier Interactive Patient Education  2017 [employer].

## 2017-03-25 NOTE — Progress Notes (Signed)
Subjective:    Patient ID: Kirk [name], male    DOB: 26-Mar-1941, 76 y.o.   MRN: [id number]  HPI This is a 76 yo male, accompanied by his wife, who presents today to establish care and for cough x 1 week. He was previously a patient of Dr. Elder Negus (he has retired).  He collects [employer].   Cough- dry, a little nasal drainage. No SOB or wheeze. No fever. No chest pain or pedal edema. Does not feel poorly. He has some old cough syrup that he would like refilled. Wife with similar symptoms.   Has congenital glaucoma. Followed by Dr. Bing Plume.     Past Medical History:  Diagnosis Date  . Cataract   . Congenital glaucoma    "both eyes operated on when I was 35 months old"  . Cough   . Disturbance of skin sensation   . Diverticulosis of colon 2014  . Diverticulum of esophagus, acquired   . Dysphagia, unspecified(787.20)   . Encounter for long-term (current) use of other medications   . Herpes zoster without mention of complication   . Hyperglycemia   . Hyperlipidemia   . Hypertension   . 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 Lumbago   . Nevus, non-neoplastic   . Osteoarthrosis, unspecified whether generalized or localized, unspecified site    pt. denies  . Other premature beats   . Pain in limb   . Plantar fascial fibromatosis   . Special screening for malignant neoplasm of prostate   . Unspecified glaucoma(365.9)   . Unspecified pruritic disorder   . Unspecified vitamin D deficiency   . Urinary frequency   . Zenker's diverticulum    Past Surgical History:  Procedure Laterality Date  . CATARACT EXTRACTION BILATERAL W/ ANTERIOR VITRECTOMY Bilateral 1977  . COLONOSCOPY  09/06/2013   [name]  . DIRECT LARYNGOSCOPY  10/22/2015   Cervical esophagoscopy. Endoscopic esophageal diverticulotomy.   [facility]  . GLAUCOMA SURGERY  1945   congenital glaucoma  . HERNIA REPAIR  1993  .  [place]  2008  . LARYNGOSCOPY     with stapling of zenkers diverticulum   Family History  Problem Relation Age of Onset  . Hypertension Mother   . Dementia Mother   . Kidney disease Father   . CAD Father   . Colon cancer Neg Hx   . Throat cancer Neg Hx   . Diabetes Neg Hx   . Liver disease Neg Hx    Social History  Substance Use Topics  . Smoking status: Never Smoker  . Smokeless tobacco: Never Used  . Alcohol use No      Review of Systems Per HPI    Objective:   Physical Exam  Constitutional: He is oriented to person, place, and time. He appears well-developed and well-nourished. No distress.  HENT:  Head: Normocephalic.  Right Ear: Tympanic membrane, external ear and ear canal normal.  Left Ear: Tympanic membrane, external ear and ear canal normal.  Nose: Mucosal edema present.  Mouth/Throat: Uvula is midline. Posterior oropharyngeal erythema present.  Post nasal drainage.   Eyes: Right eye exhibits discharge. Left eye exhibits discharge. Right conjunctiva is injected. Left conjunctiva is injected.  Conjunctiva injected- wife reports this is always how his eyes appear. Small amount cream colored drainage from inner canthus.   Cardiovascular: Normal rate, regular rhythm and normal heart sounds.   Pulmonary/Chest: Effort normal  and breath sounds normal.  Musculoskeletal: He exhibits no edema.  Neurological: He is alert and oriented to person, place, and time.  Skin: Skin is warm and dry. He is not diaphoretic.  Psychiatric: He has a normal mood and affect. His behavior is normal. Judgment and thought content normal.  Vitals reviewed.    BP 130/78 (BP Location: Right Arm, Patient Position: Sitting, Cuff Size: Normal)   Pulse 83   Temp 98.2 F (36.8 C) (Oral)   Ht 6' (1.829 m)   Wt 196 lb 12.8 oz (89.3 kg)   SpO2 94%   BMI 26.69 kg/m  Wt Readings from Last 3 Encounters:  03/25/17 196 lb 12.8 oz (89.3 kg)  01/11/17 201 lb (91.2 kg)  12/22/16  200 lb (90.7 kg)        Assessment & Plan:  1. Encounter to establish care - will follow up with CPE in 6 months  2. Cough - HYDROcodone-acetaminophen (HYCET) 7.5-325 mg/15 ml solution; Take 10-15 mLs by mouth every 6 (six) hours as needed.  Dispense: 120 mL; Refill: 0  3. Viral URI - feels otherwise well, lungs clear - RTC precautions reviewed- fever/chills, worsening cough/sputum, increased eye drainage   Clarene Reamer, FNP-BC  [facility]  03/25/2017 2:26 PM

## 2017-04-05 DIAGNOSIS — H02831 Dermatochalasis of right upper eyelid: Secondary | ICD-10-CM | POA: Diagnosis not present

## 2017-04-05 DIAGNOSIS — H02839 Dermatochalasis of unspecified eye, unspecified eyelid: Secondary | ICD-10-CM | POA: Diagnosis not present

## 2017-04-05 DIAGNOSIS — L908 Other atrophic disorders of skin: Secondary | ICD-10-CM | POA: Diagnosis not present

## 2017-04-05 DIAGNOSIS — H02834 Dermatochalasis of left upper eyelid: Secondary | ICD-10-CM | POA: Diagnosis not present

## 2017-05-22 IMAGING — CT CT HEAD W/O CM
1 series · 16 of 30 positions shown, 20 images · non-contrast
Comparison: CT head 12/15/2010

CLINICAL DATA: Syncope versus seizure

EXAM:
CT HEAD WITHOUT CONTRAST
TECHNIQUE: Contiguous axial images were obtained from the base of the skull
through the vertex without intravenous contrast.

[Series 2: head 5.0 h30s · axial · 0.47mm/px · z∈[-98,+52]mm · 16 of 34 slices shown, 20 images]
[im 2/34  brain]
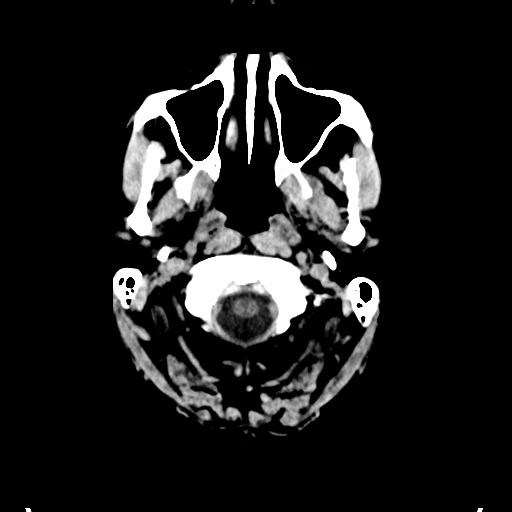
[im 2/34  bone]
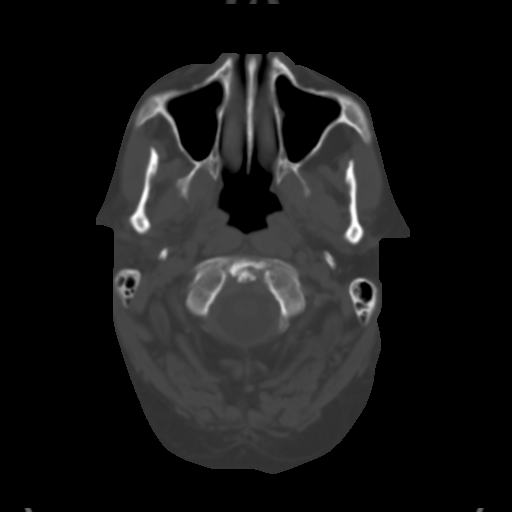
[im 4/34  brain]
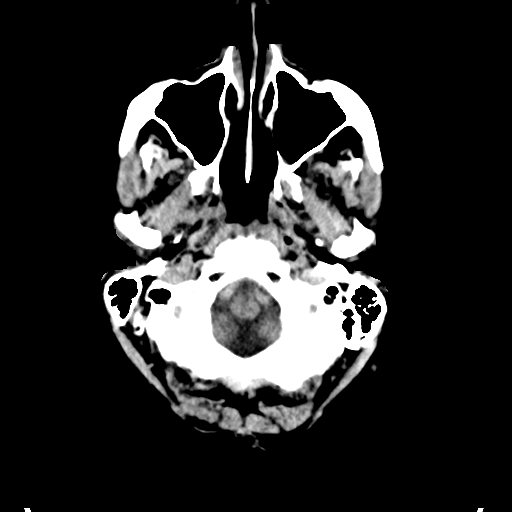
[im 6/34  brain]
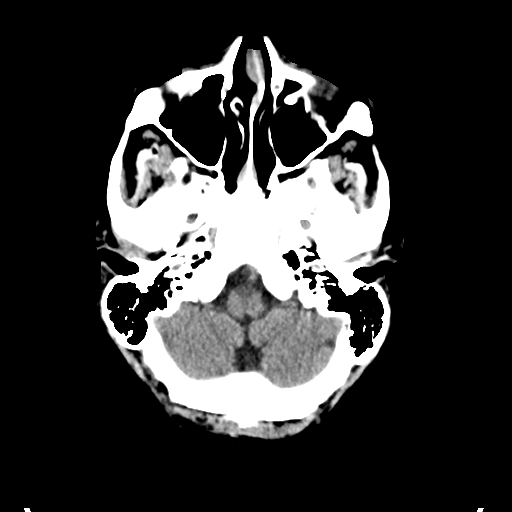
[im 8/34  brain]
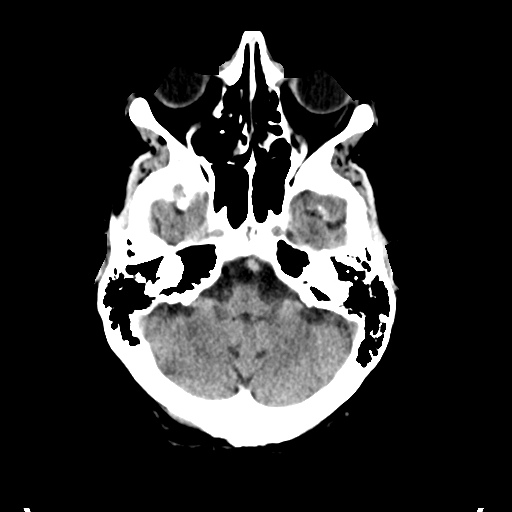
[im 10/34  brain]
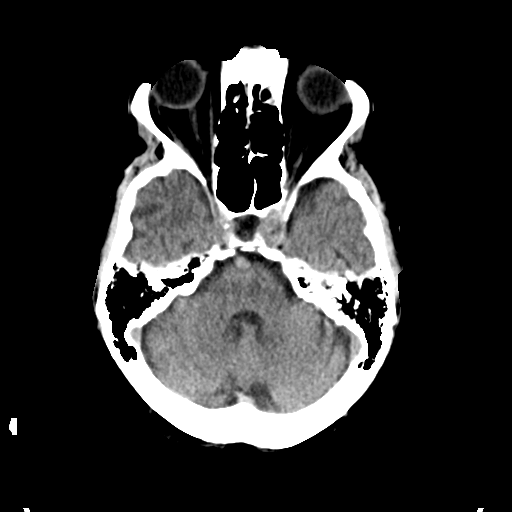
[im 10/34  bone]
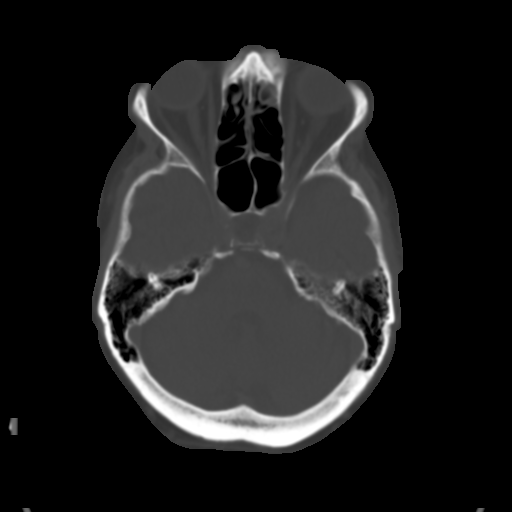
[im 12/34  brain]
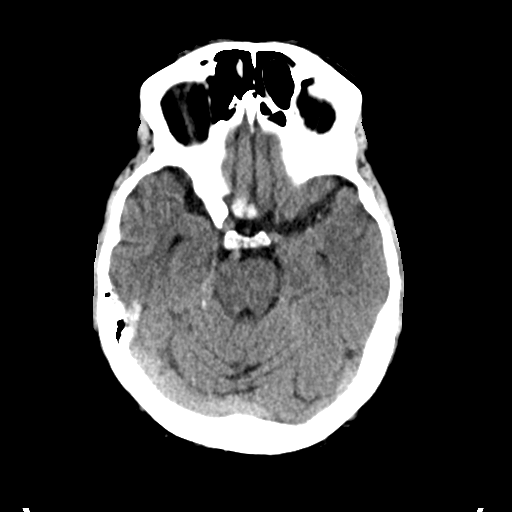
[im 14/34  brain]
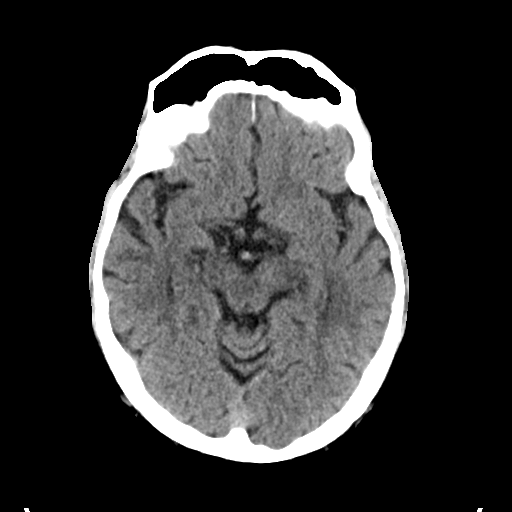
[im 16/34  brain]
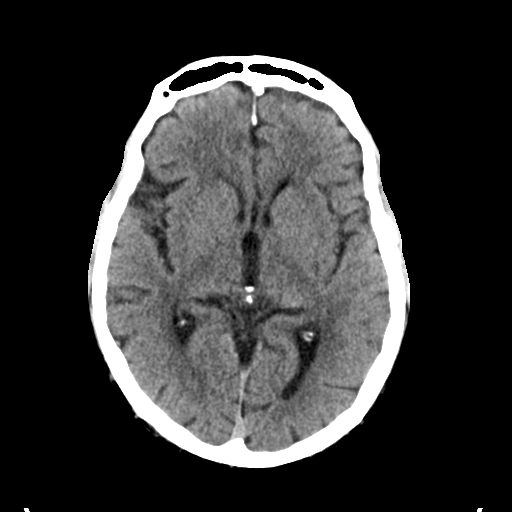
[im 18/34  brain]
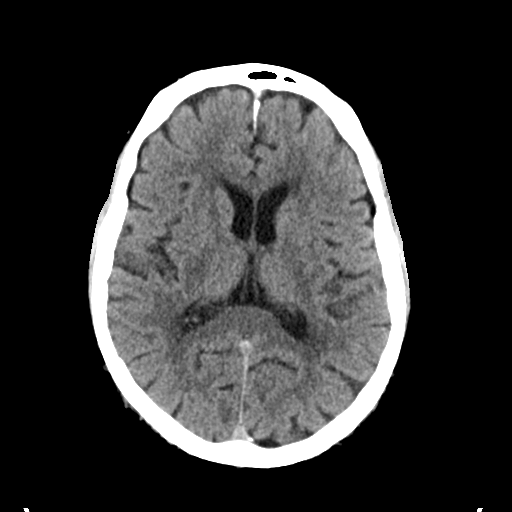
[im 18/34  bone]
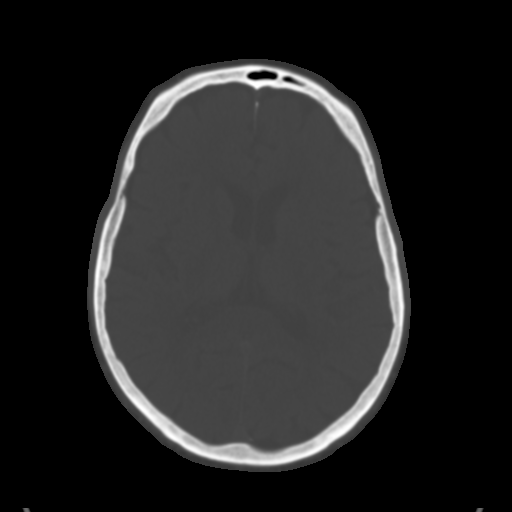
[im 20/34  brain]
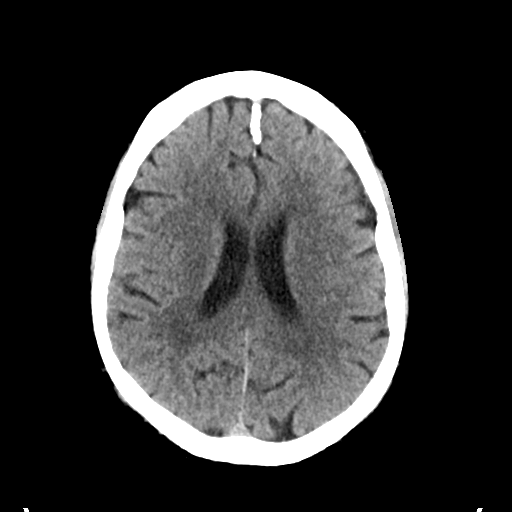
[im 22/34  brain]
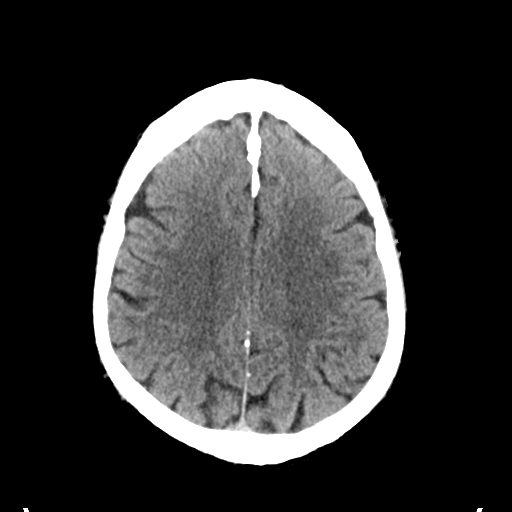
[im 24/34  brain]
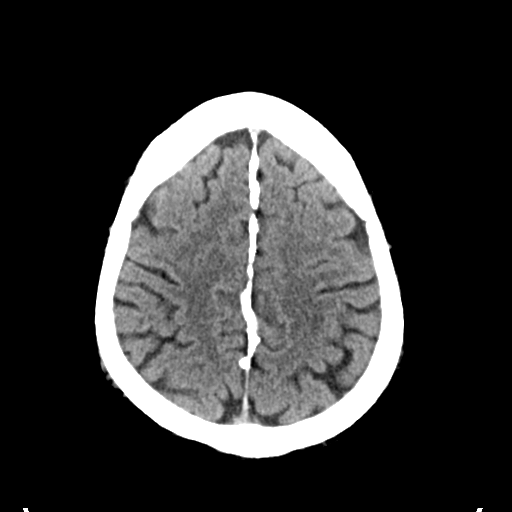
[im 26/34  brain]
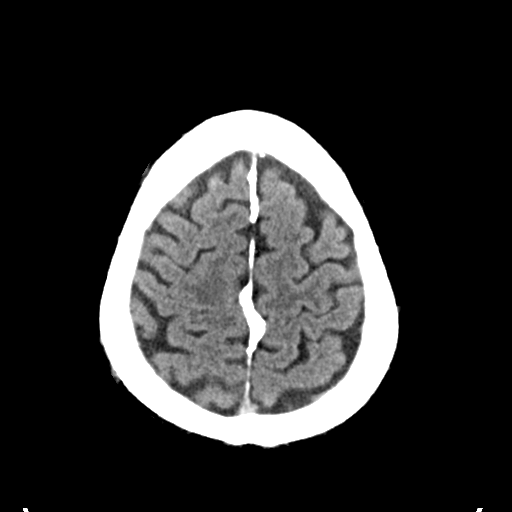
[im 26/34  bone]
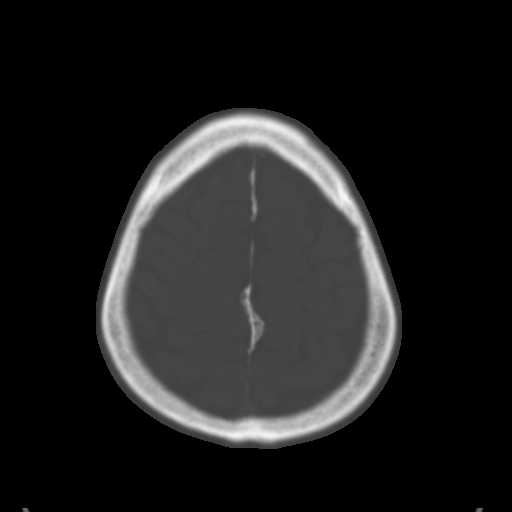
[im 28/34  brain]
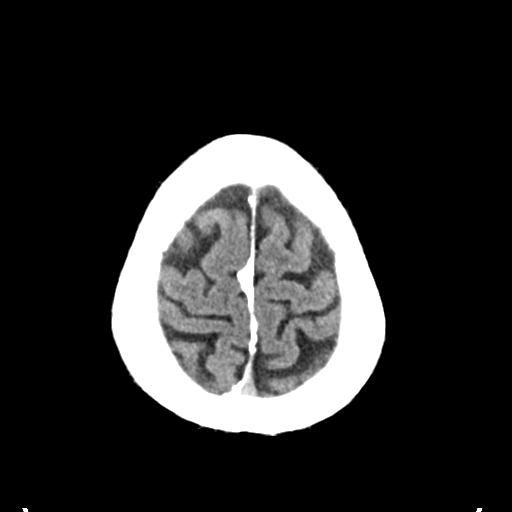
[im 30/34  brain]
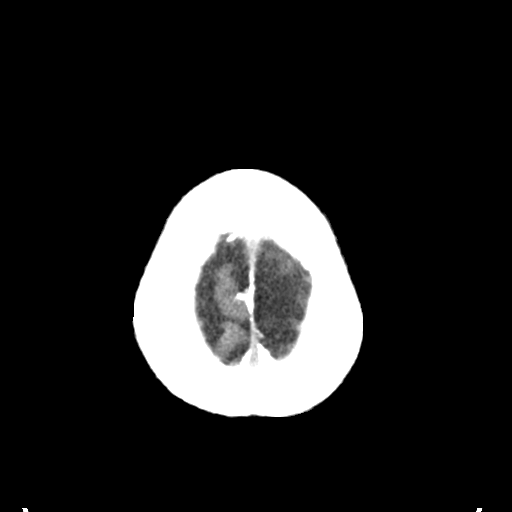
[im 32/34  brain]
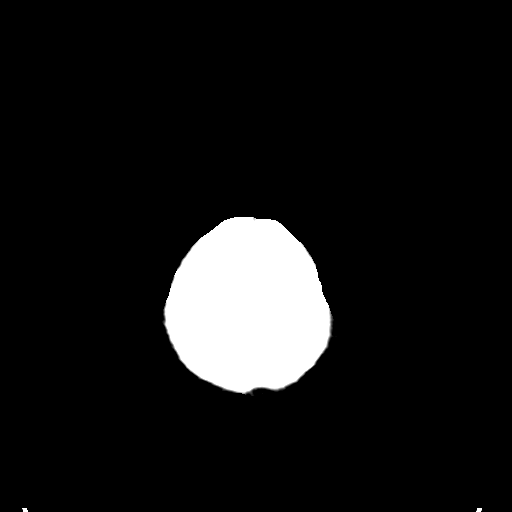

[16 of 30 positions shown; findings below may reference images not displayed]

FINDINGS: Ventricle size normal. Mild atrophy. Mild chronic microvascular
ischemic change in the white matter.

Negative for acute infarct. Negative for hemorrhage or mass lesion.
No shift of the midline structures.

Calvarium is normal.  Mild mucosal edema in the paranasal sinuses.
IMPRESSION: Atrophy and chronic microvascular ischemia.  No acute abnormality.

## 2017-05-22 IMAGING — DX DG CHEST 2V
2 series · 2 of 2 positions shown · non-contrast
Comparison: 12/15/2010

CLINICAL DATA: Cough for 3 weeks.

EXAM:
CHEST - 2 VIEW

[chest lat]
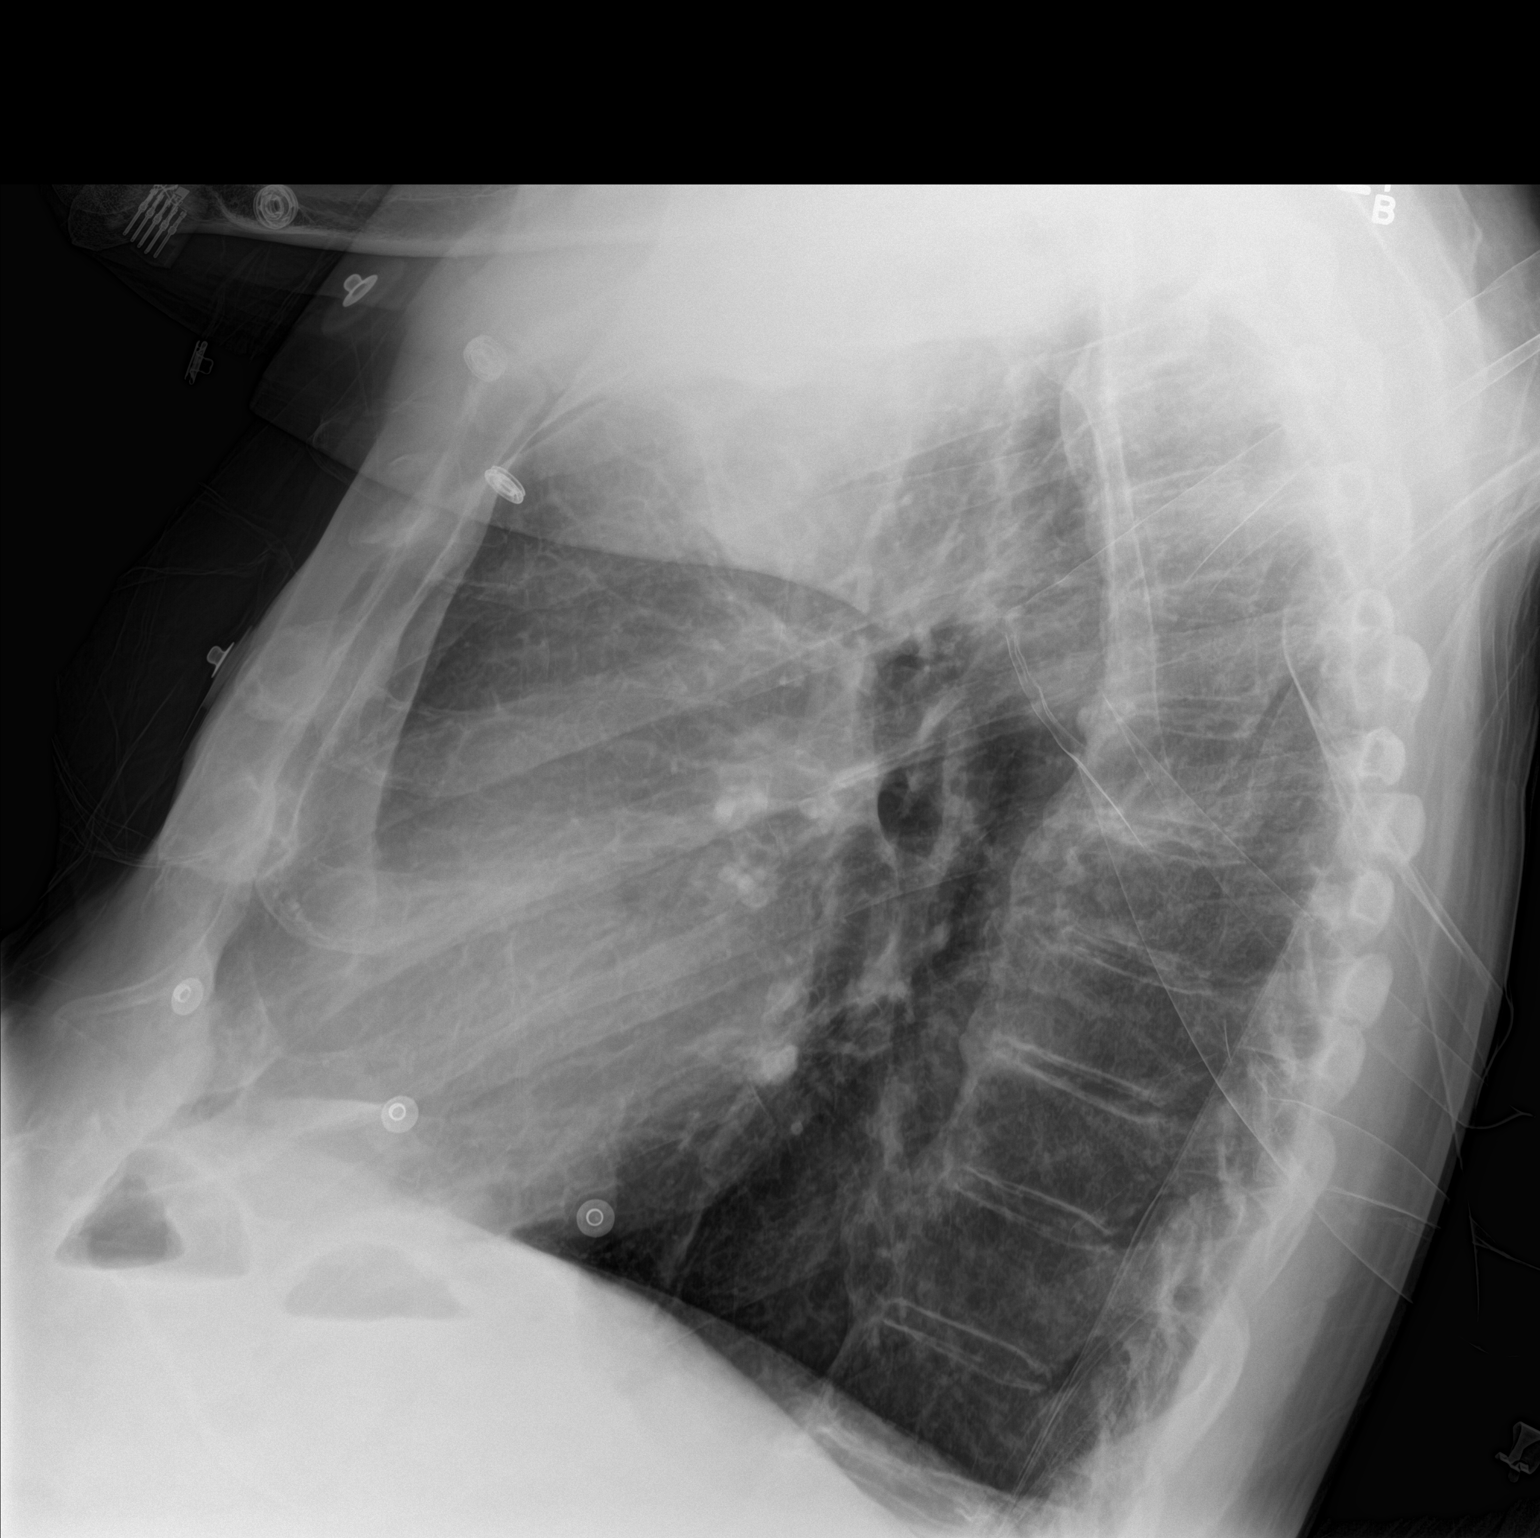

[chest ap]
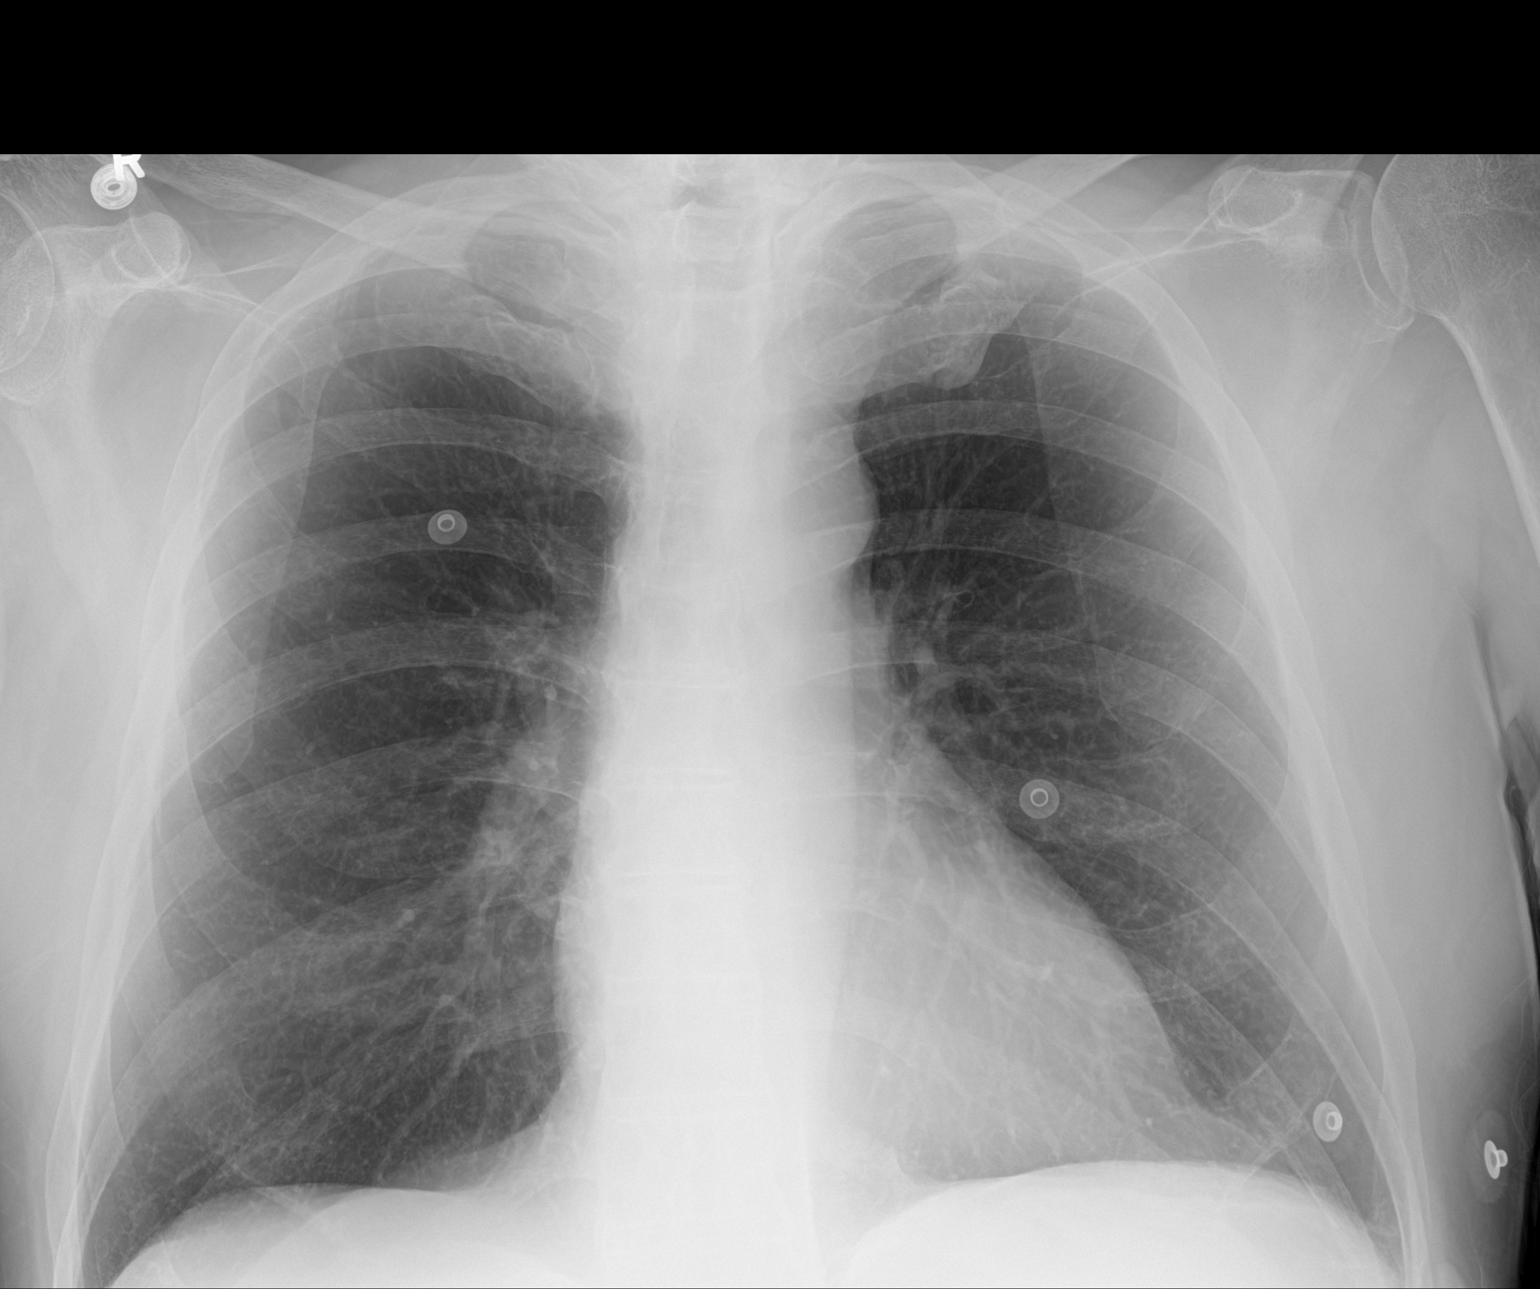

[2 of 2 positions shown; findings below may reference images not displayed]

FINDINGS: The heart size and mediastinal contours are within normal limits.
There is no evidence of pulmonary edema, consolidation,
pneumothorax, nodule or pleural fluid. The visualized skeletal
structures are unremarkable.
IMPRESSION: No active disease.

## 2017-05-29 ENCOUNTER — Other Ambulatory Visit: Payer: Self-pay | Admitting: Internal Medicine

## 2017-06-03 ENCOUNTER — Other Ambulatory Visit: Payer: Self-pay | Admitting: Internal Medicine

## 2017-06-10 DIAGNOSIS — H02831 Dermatochalasis of right upper eyelid: Secondary | ICD-10-CM | POA: Diagnosis not present

## 2017-06-10 DIAGNOSIS — I10 Essential (primary) hypertension: Secondary | ICD-10-CM | POA: Diagnosis not present

## 2017-06-10 DIAGNOSIS — H02834 Dermatochalasis of left upper eyelid: Secondary | ICD-10-CM | POA: Diagnosis not present

## 2017-06-10 DIAGNOSIS — Z79899 Other long term (current) drug therapy: Secondary | ICD-10-CM | POA: Diagnosis not present

## 2017-06-10 DIAGNOSIS — L908 Other atrophic disorders of skin: Secondary | ICD-10-CM | POA: Diagnosis not present

## 2017-06-10 DIAGNOSIS — L574 Cutis laxa senilis: Secondary | ICD-10-CM | POA: Diagnosis not present

## 2017-06-10 DIAGNOSIS — H02403 Unspecified ptosis of bilateral eyelids: Secondary | ICD-10-CM | POA: Diagnosis not present

## 2017-06-10 DIAGNOSIS — Z9849 Cataract extraction status, unspecified eye: Secondary | ICD-10-CM | POA: Diagnosis not present

## 2017-06-23 DIAGNOSIS — H401133 Primary open-angle glaucoma, bilateral, severe stage: Secondary | ICD-10-CM | POA: Diagnosis not present

## 2017-06-23 DIAGNOSIS — H43393 Other vitreous opacities, bilateral: Secondary | ICD-10-CM | POA: Diagnosis not present

## 2017-06-23 DIAGNOSIS — H02401 Unspecified ptosis of right eyelid: Secondary | ICD-10-CM | POA: Diagnosis not present

## 2017-06-23 DIAGNOSIS — H04123 Dry eye syndrome of bilateral lacrimal glands: Secondary | ICD-10-CM | POA: Diagnosis not present

## 2017-07-21 ENCOUNTER — Telehealth: Payer: Self-pay | Admitting: Family Medicine

## 2017-07-21 ENCOUNTER — Ambulatory Visit (INDEPENDENT_AMBULATORY_CARE_PROVIDER_SITE_OTHER): Payer: PPO | Admitting: Family Medicine

## 2017-07-21 DIAGNOSIS — Z23 Encounter for immunization: Secondary | ICD-10-CM | POA: Diagnosis not present

## 2017-07-21 NOTE — Progress Notes (Signed)
Pt here today for influenza vaccine. Patient tolerated well.

## 2017-07-21 NOTE — Telephone Encounter (Signed)
Patient has CPE w/ Jerline Pain 10/05/2017. Switched from NP [employer].  Ty,  -LL

## 2017-07-21 NOTE — Telephone Encounter (Signed)
FYI

## 2017-10-05 ENCOUNTER — Ambulatory Visit (INDEPENDENT_AMBULATORY_CARE_PROVIDER_SITE_OTHER): Payer: PPO | Admitting: Family Medicine

## 2017-10-05 ENCOUNTER — Encounter: Payer: Self-pay | Admitting: Family Medicine

## 2017-10-05 ENCOUNTER — Telehealth: Payer: Self-pay | Admitting: Family Medicine

## 2017-10-05 VITALS — BP 128/76 | HR 73 | Temp 97.3°F | Ht 72.0 in | Wt 198.4 lb

## 2017-10-05 DIAGNOSIS — Z125 Encounter for screening for malignant neoplasm of prostate: Secondary | ICD-10-CM | POA: Diagnosis not present

## 2017-10-05 DIAGNOSIS — E559 Vitamin D deficiency, unspecified: Secondary | ICD-10-CM

## 2017-10-05 DIAGNOSIS — E785 Hyperlipidemia, unspecified: Secondary | ICD-10-CM

## 2017-10-05 DIAGNOSIS — I10 Essential (primary) hypertension: Secondary | ICD-10-CM | POA: Diagnosis not present

## 2017-10-05 DIAGNOSIS — L989 Disorder of the skin and subcutaneous tissue, unspecified: Secondary | ICD-10-CM | POA: Diagnosis not present

## 2017-10-05 DIAGNOSIS — R7989 Other specified abnormal findings of blood chemistry: Secondary | ICD-10-CM | POA: Diagnosis not present

## 2017-10-05 DIAGNOSIS — R739 Hyperglycemia, unspecified: Secondary | ICD-10-CM | POA: Diagnosis not present

## 2017-10-05 NOTE — Assessment & Plan Note (Signed)
Last A1c 5.8 about a year ago.  Discussed lifestyle modifications.  Check A1c with next blood draw.

## 2017-10-05 NOTE — Assessment & Plan Note (Signed)
Continue vitamin D supplementation.  Check vitamin D level with next blood draw.

## 2017-10-05 NOTE — Patient Instructions (Addendum)
No changes today. Please see your skin doctor soon as possible for the spot on your neck. You may need referral to podiatry for her toenail.  Come back on or after April 5 to have your blood labs checked.  Comeback to see me in about 6 months, or sooner as needed.  Take care, Dr. Jerline Pain

## 2017-10-05 NOTE — Assessment & Plan Note (Signed)
Discussed lifestyle modifications.  Check lipid panel with next blood draw.

## 2017-10-05 NOTE — Progress Notes (Signed)
   Subjective:  Kirk [name] is a 77 y.o. male who presents today with a chief complaint of hypertension and to establish care.   HPI:   Stable chronic problems: HTN. Stable on irbesartan-HCTZ. Tolerating well without side effects. No CP or SOB.  Dyslipidemia. Stable. Not on any medications.  Does not regularly exercise. Hyperglycemia/Elevated A1c. Stable. Not on any medications.  Does not regular exercise.  Neck skin lesion, acute issue Patient first noticed 2-3 weeks ago.  Has been stable over that time.  Located on his left neck.  Has little bit of swelling and [name] to the area.  No bleeding.  No obvious precipitating events.  He has not tried any treatments for it.  ROS: Per HPI  PMH: he reports that  has never smoked. he has never used smokeless tobacco. He reports that he does not drink alcohol or use drugs.  Objective:  Physical Exam: BP 128/76 (BP Location: Left Arm, Patient Position: Sitting, Cuff Size: Normal)   Pulse 73   Temp (!) 97.3 F (36.3 C) (Oral)   Ht 6' (1.829 m)   Wt 198 lb 6.4 oz (90 kg)   SpO2 95%   BMI 26.91 kg/m   Gen: NAD, resting comfortably CV: RRR with no murmurs appreciated Pulm: NWOB, CTAB with no crackles, wheezes, or rhonchi MSK: No edema, cyanosis, or clubbing noted Skin: Approximately 7 mm raised lesion on the left neck near SCM muscle.  Lesion has central ulceration and raised borders. Neuro: Grossly normal, moves all extremities Psych: Normal affect and thought content  Assessment/Plan:  Essential hypertension At goal.  Continue irbesartan-HCTZ.  Check CMET with next blood draw in 3 months.  Vitamin D deficiency Continue vitamin D supplementation.  Check vitamin D level with next blood draw.  Hyperglycemia Last A1c 5.8 about a year ago.  Discussed lifestyle modifications.  Check A1c with next blood draw.  Hyperlipidemia Discussed lifestyle modifications.  Check lipid panel with next blood draw.  TSH elevation Check TSH with  next blood draw.  Skin lesion Concern for basal cell carcinoma.  Given the location near carotid artery and jugular vein, decision to perform punch biopsy was deferred today.  Advised patient to follow-up closely with dermatology.  Preventative healthcare Check PSA with next blood draw.  Advised patient to follow-up for annual wellness visit, however he deferred.  Kirk [name]. Kirk Pain, MD 10/05/2017 10:05 AM

## 2017-10-05 NOTE — Assessment & Plan Note (Signed)
At goal.  Continue irbesartan-HCTZ.  Check CMET with next blood draw in 3 months.

## 2017-10-05 NOTE — Assessment & Plan Note (Signed)
Check TSH with next blood draw

## 2017-10-05 NOTE — Telephone Encounter (Signed)
Error

## 2017-10-19 DIAGNOSIS — H02831 Dermatochalasis of right upper eyelid: Secondary | ICD-10-CM | POA: Diagnosis not present

## 2017-10-19 DIAGNOSIS — H02834 Dermatochalasis of left upper eyelid: Secondary | ICD-10-CM | POA: Diagnosis not present

## 2017-10-20 DIAGNOSIS — H02831 Dermatochalasis of right upper eyelid: Secondary | ICD-10-CM | POA: Diagnosis not present

## 2017-10-20 DIAGNOSIS — H43393 Other vitreous opacities, bilateral: Secondary | ICD-10-CM | POA: Diagnosis not present

## 2017-10-20 DIAGNOSIS — H02401 Unspecified ptosis of right eyelid: Secondary | ICD-10-CM | POA: Diagnosis not present

## 2017-10-20 DIAGNOSIS — H02832 Dermatochalasis of right lower eyelid: Secondary | ICD-10-CM | POA: Diagnosis not present

## 2017-10-20 DIAGNOSIS — H401133 Primary open-angle glaucoma, bilateral, severe stage: Secondary | ICD-10-CM | POA: Diagnosis not present

## 2017-10-20 DIAGNOSIS — H04123 Dry eye syndrome of bilateral lacrimal glands: Secondary | ICD-10-CM | POA: Diagnosis not present

## 2017-10-20 DIAGNOSIS — H02834 Dermatochalasis of left upper eyelid: Secondary | ICD-10-CM | POA: Diagnosis not present

## 2017-10-20 DIAGNOSIS — H02835 Dermatochalasis of left lower eyelid: Secondary | ICD-10-CM | POA: Diagnosis not present

## 2017-12-08 ENCOUNTER — Other Ambulatory Visit: Payer: Self-pay | Admitting: Family Medicine

## 2017-12-08 MED ORDER — LISINOPRIL-HYDROCHLOROTHIAZIDE 20-12.5 MG PO TABS: 1.0000 | ORAL_TABLET | Freq: Every day | ORAL | 3 refills | Status: DC

## 2017-12-08 NOTE — Progress Notes (Signed)
Pt's irbesartan-HCTZ on back order. Will send in lisinopril-HCTZ.  Please inform patient.  Algis Greenhouse. Jerline Pain, MD 12/08/2017 9:05 AM

## 2017-12-09 NOTE — Progress Notes (Signed)
Spoke with patient who stated he picked up the new prescription yesterday. I encouraged him to let us know if he felt he wasn't tolerating the medication. He verbalized understanding.

## 2018-01-25 ENCOUNTER — Other Ambulatory Visit (INDEPENDENT_AMBULATORY_CARE_PROVIDER_SITE_OTHER): Payer: PPO

## 2018-01-25 DIAGNOSIS — Z125 Encounter for screening for malignant neoplasm of prostate: Secondary | ICD-10-CM | POA: Diagnosis not present

## 2018-01-25 DIAGNOSIS — E559 Vitamin D deficiency, unspecified: Secondary | ICD-10-CM | POA: Diagnosis not present

## 2018-01-25 DIAGNOSIS — R739 Hyperglycemia, unspecified: Secondary | ICD-10-CM | POA: Diagnosis not present

## 2018-01-25 DIAGNOSIS — I10 Essential (primary) hypertension: Secondary | ICD-10-CM | POA: Diagnosis not present

## 2018-01-25 DIAGNOSIS — R7989 Other specified abnormal findings of blood chemistry: Secondary | ICD-10-CM | POA: Diagnosis not present

## 2018-01-25 DIAGNOSIS — E785 Hyperlipidemia, unspecified: Secondary | ICD-10-CM

## 2018-01-25 LAB — CBC
HCT: 47.6 % (ref 39.0–52.0)
Hemoglobin: 16.3 g/dL (ref 13.0–17.0)
MCHC: 34.1 g/dL (ref 30.0–36.0)
MCV: 89.5 fl (ref 78.0–100.0)
Platelets: 216 10*3/uL (ref 150.0–400.0)
RBC: 5.32 Mil/uL (ref 4.22–5.81)
RDW: 14.4 % (ref 11.5–15.5)
WBC: 10.8 10*3/uL — ABNORMAL HIGH (ref 4.0–10.5)

## 2018-01-25 LAB — LIPID PANEL
Cholesterol: 163 mg/dL (ref 0–200)
HDL: 43.8 mg/dL (ref >39.00)
LDL Cholesterol: 92 mg/dL (ref 0–99)
NonHDL: 119.27
Total CHOL/HDL Ratio: 4
Triglycerides: 137 mg/dL (ref 0.0–149.0)
VLDL: 27.4 mg/dL (ref 0.0–40.0)

## 2018-01-25 LAB — COMPREHENSIVE METABOLIC PANEL
ALT: 15 U/L (ref 0–53)
AST: 14 U/L (ref 0–37)
Albumin: 4.1 g/dL (ref 3.5–5.2)
Alkaline Phosphatase: 62 U/L (ref 39–117)
BUN: 15 mg/dL (ref 6–23)
CO2: 29 mEq/L (ref 19–32)
Calcium: 9.8 mg/dL (ref 8.4–10.5)
Chloride: 100 mEq/L (ref 96–112)
Creatinine, Ser: 0.94 mg/dL (ref 0.40–1.50)
GFR: 82.91 mL/min (ref >60.00)
Glucose, Bld: 104 mg/dL — ABNORMAL HIGH (ref 70–99)
Potassium: 4.1 mEq/L (ref 3.5–5.1)
Sodium: 135 mEq/L (ref 135–145)
Total Bilirubin: 0.9 mg/dL (ref 0.2–1.2)
Total Protein: 7.3 g/dL (ref 6.0–8.3)

## 2018-01-25 LAB — VITAMIN D 25 HYDROXY (VIT D DEFICIENCY, FRACTURES): VITD: 43.64 ng/mL (ref 30.00–100.00)

## 2018-01-25 LAB — TSH: TSH: 4.66 u[IU]/mL — ABNORMAL HIGH (ref 0.35–4.50)

## 2018-01-25 LAB — HEMOGLOBIN A1C: Hgb A1c MFr Bld: 6.2 % (ref 4.6–6.5)

## 2018-01-25 LAB — PSA: PSA: 1.71 ng/mL (ref 0.10–4.00)

## 2018-02-02 ENCOUNTER — Encounter: Payer: Self-pay | Admitting: Family Medicine

## 2018-02-02 ENCOUNTER — Telehealth: Payer: Self-pay | Admitting: Family Medicine

## 2018-02-02 ENCOUNTER — Ambulatory Visit (INDEPENDENT_AMBULATORY_CARE_PROVIDER_SITE_OTHER): Payer: PPO | Admitting: Family Medicine

## 2018-02-02 VITALS — BP 128/80 | HR 80 | Temp 98.8°F | Ht 73.0 in | Wt 191.0 lb

## 2018-02-02 DIAGNOSIS — I10 Essential (primary) hypertension: Secondary | ICD-10-CM

## 2018-02-02 DIAGNOSIS — R05 Cough: Secondary | ICD-10-CM

## 2018-02-02 DIAGNOSIS — R059 Cough, unspecified: Secondary | ICD-10-CM

## 2018-02-02 MED ORDER — LISINOPRIL-HYDROCHLOROTHIAZIDE 20-12.5 MG PO TABS: 0.5000 | ORAL_TABLET | Freq: Every day | ORAL | 3 refills | Status: DC

## 2018-02-02 MED ORDER — GUAIFENESIN-CODEINE 100-10 MG/5ML PO SOLN: 5.0000 mL | Freq: Three times a day (TID) | ORAL | 0 refills | Status: DC | PRN

## 2018-02-02 NOTE — Assessment & Plan Note (Signed)
At goal today.  Given his orthostatic symptoms, we will cut his dose in half to see if this helps.  Discussed home blood pressure monitoring with goal 140/90.

## 2018-02-02 NOTE — Patient Instructions (Signed)
It was very nice to see you.  I will send in a cough medication for you.   Please cut your blood pressure pill in half.  Please let me know if your cough does not improve over the next 1-2 weeks.  Please let me know if your blood pressure increases to 140/90 or higher.  Otherwise I will see you back in 6 months.  Take care, Dr Jerline Pain

## 2018-02-02 NOTE — Progress Notes (Signed)
   Subjective:  Kirk [name] is a 77 y.o. male who presents today for same-day appointment with a chief complaint of cough.   HPI:  Cough, Acute Issue Started a couple of weeks ago. Worsened over that time. Took robitussin which did not help. No fevers or chills. No shortness of breath. Cough is nonproductive. Worse when laying on back.  No other obvious alleviating or aggravating factors.  Hypertension, established problem, Stable BP Readings from Last 3 Encounters:  02/02/18 128/80  10/05/17 128/76  03/25/17 130/78   Patient switched to lisnopril-HCTZ last month due to losartan-HCTZ being on recall.  Has done well though occasionally feels dizzy when standing up.  ROS: Per HPI  PMH: He reports that he has never smoked. He has never used smokeless tobacco. He reports that he does not drink alcohol or use drugs.  Objective:  Physical Exam: BP 128/80 (BP Location: Left Arm)   Pulse 80   Temp 98.8 F (37.1 C)   Ht 6\' 1"  (1.854 m)   Wt 191 lb (86.6 kg)   SpO2 97%   BMI 25.20 kg/m   Gen: NAD, resting comfortably HEENT: TMs clear. OP clear. Nasal mucosa normal.  CV: RRR with no murmurs appreciated Pulm: NWOB, CTAB with no crackles, wheezes, or rhonchi  Assessment/Plan:  Cough Possibly due to viral URI.  No signs of systemic illness.  We will send in guaifenesin-codeine cough syrup for patient.  Possibly due to recent switch to lisinopril.  If no improvement over the next 1 to 2 weeks, would consider switching to alternative antihypertensive.  Hypertension At goal today.  Given his orthostatic symptoms, we will cut his dose in half to see if this helps.  Discussed home blood pressure monitoring with goal 140/90.  Algis Greenhouse. Jerline Pain, MD 02/02/2018 4:23 PM

## 2018-02-02 NOTE — Telephone Encounter (Signed)
NA

## 2018-02-07 DIAGNOSIS — H02834 Dermatochalasis of left upper eyelid: Secondary | ICD-10-CM | POA: Diagnosis not present

## 2018-02-07 DIAGNOSIS — H02831 Dermatochalasis of right upper eyelid: Secondary | ICD-10-CM | POA: Diagnosis not present

## 2018-02-07 DIAGNOSIS — H02835 Dermatochalasis of left lower eyelid: Secondary | ICD-10-CM | POA: Diagnosis not present

## 2018-02-07 DIAGNOSIS — H02832 Dermatochalasis of right lower eyelid: Secondary | ICD-10-CM | POA: Diagnosis not present

## 2018-02-07 DIAGNOSIS — H00024 Hordeolum internum left upper eyelid: Secondary | ICD-10-CM | POA: Diagnosis not present

## 2018-02-09 DIAGNOSIS — H00024 Hordeolum internum left upper eyelid: Secondary | ICD-10-CM | POA: Diagnosis not present

## 2018-02-14 ENCOUNTER — Ambulatory Visit: Payer: PPO | Admitting: Family Medicine

## 2018-02-23 DIAGNOSIS — H02401 Unspecified ptosis of right eyelid: Secondary | ICD-10-CM | POA: Diagnosis not present

## 2018-02-23 DIAGNOSIS — H401133 Primary open-angle glaucoma, bilateral, severe stage: Secondary | ICD-10-CM | POA: Diagnosis not present

## 2018-02-23 DIAGNOSIS — H43393 Other vitreous opacities, bilateral: Secondary | ICD-10-CM | POA: Diagnosis not present

## 2018-02-23 DIAGNOSIS — H04123 Dry eye syndrome of bilateral lacrimal glands: Secondary | ICD-10-CM | POA: Diagnosis not present

## 2018-03-29 DIAGNOSIS — C4441 Basal cell carcinoma of skin of scalp and neck: Secondary | ICD-10-CM | POA: Diagnosis not present

## 2018-04-12 DIAGNOSIS — C4441 Basal cell carcinoma of skin of scalp and neck: Secondary | ICD-10-CM | POA: Diagnosis not present

## 2018-04-13 ENCOUNTER — Ambulatory Visit: Payer: PPO | Admitting: Family Medicine

## 2018-05-29 DIAGNOSIS — C4441 Basal cell carcinoma of skin of scalp and neck: Secondary | ICD-10-CM | POA: Diagnosis not present

## 2018-07-06 ENCOUNTER — Ambulatory Visit (INDEPENDENT_AMBULATORY_CARE_PROVIDER_SITE_OTHER): Payer: PPO

## 2018-07-06 ENCOUNTER — Ambulatory Visit (INDEPENDENT_AMBULATORY_CARE_PROVIDER_SITE_OTHER): Payer: PPO | Admitting: Family Medicine

## 2018-07-06 ENCOUNTER — Encounter: Payer: Self-pay | Admitting: Family Medicine

## 2018-07-06 VITALS — BP 138/84 | HR 86 | Temp 98.6°F | Ht 73.0 in | Wt 193.0 lb

## 2018-07-06 DIAGNOSIS — M25571 Pain in right ankle and joints of right foot: Secondary | ICD-10-CM

## 2018-07-06 DIAGNOSIS — I10 Essential (primary) hypertension: Secondary | ICD-10-CM

## 2018-07-06 DIAGNOSIS — E785 Hyperlipidemia, unspecified: Secondary | ICD-10-CM | POA: Diagnosis not present

## 2018-07-06 DIAGNOSIS — R7989 Other specified abnormal findings of blood chemistry: Secondary | ICD-10-CM

## 2018-07-06 MED ORDER — DICLOFENAC SODIUM 75 MG PO TBEC: 75.0000 mg | DELAYED_RELEASE_TABLET | Freq: Two times a day (BID) | ORAL | 0 refills | Status: DC

## 2018-07-06 NOTE — Patient Instructions (Signed)
It was very nice to see you today!  You may have a small hairline fracture on your ankle. Please try the brace whenever you are active.  Take the diclofenac for swelling.  You can also use ice as needed.  We will check blood work today.  Please let me know if your blood pressure is persistently 140/90 or higher.  Come back to see Dr. Paulla Fore in 1 to 2 weeks.  Take care, Dr Jerline Pain

## 2018-07-06 NOTE — Assessment & Plan Note (Signed)
Check lipid panel today 

## 2018-07-06 NOTE — Assessment & Plan Note (Signed)
Blood pressure at goal.  His home cuff consistently read 20-30 points higher than ours here.  Patient stated he did not change the battery approximately 2 years and thinks that this could be contributing.  He will change his blood pressure cuff and continue monitoring at home.  Discussed goal blood pressures of 140/90 or lower.  Check CBC and CMET today.  Follow-up with me in 6 months.

## 2018-07-06 NOTE — Assessment & Plan Note (Signed)
Check TSH today

## 2018-07-06 NOTE — Progress Notes (Signed)
   Subjective:  Kirk [name] is a 77 y.o. male who presents today with a chief complaint of ankle [name].   HPI:  Ankle [name], acute problem Patient tripped over a decorative concrete pineapple about a week ago.  He did not fall however he did strike the inner part of his right ankle against a concrete block.  Immediately noticed [name] and swelling to the area.  He has had quite a bit of bruising there over the last few days as well.  [name] is worse with walking and standing.  He has tried putting ice on the area which is helped a little bit.  Tylenol and ibuprofen also help some.  He is able to bear weight.  HTN, chronic problem, stable Patient has been monitoring his blood pressure at home has found him as high as the 160s 170s over 100s. He is currently on lisinopril-HCTZ 20-12.5 half a tablet daily and is compliant with this without side effects.  No reported chest [name] or shortness of breath.    ROS: Per HPI  PMH: He reports that he has never smoked. He has never used smokeless tobacco. He reports that he does not drink alcohol or use drugs.  Objective:  Physical Exam: BP 138/84 (BP Location: Left Arm, Patient Position: Sitting, Cuff Size: Normal)   Pulse 86   Temp 98.6 F (37 C) (Oral)   Ht 6\' 1"  (1.854 m)   Wt 193 lb (87.5 kg)   SpO2 96%   BMI 25.46 kg/m   Gen: NAD, resting comfortably CV: RRR with no murmurs appreciated Pulm: NWOB, CTAB with no crackles, wheezes, or rhonchi MSK: -Right ankle: Medial malleolus with surrounding ecchymosis and effusion.  Tender to palpation along distal edge of medial malleolus.  Full range of motion.  Able to bear weight.  Assessment/Plan:  Right ankle [name] Plain film today reviewed by myself and Dr. Paulla Fore.  He has a small area of translucency on his medial malleolus that could possibly represent a small fracture.  Official radiology read is pending.  Patient was placed in a lace up ankle brace today.  A prescription for diclofenac 75 mg twice  daily was given as well.  Instructed patient to keep leg elevated and apply ice as needed.  He will follow-up with sports medicine in 2 weeks to ensure routine healing.  Discussed reasons to return to care earlier.  TSH elevation Check TSH today.    Hyperlipidemia Check lipid panel today  Essential hypertension Blood pressure at goal.  His home cuff consistently read 20-30 points higher than ours here.  Patient stated he did not change the battery approximately 2 years and thinks that this could be contributing.  He will change his blood pressure cuff and continue monitoring at home.  Discussed goal blood pressures of 140/90 or lower.  Check CBC and CMET today.  Follow-up with me in 6 months.  Preventative Healthcare Flu shot given today.   Kirk [name]. Kirk Pain, MD 07/06/2018 4:18 PM

## 2018-07-07 LAB — LIPID PANEL
Cholesterol: 168 mg/dL (ref 0–200)
HDL: 37.5 mg/dL — ABNORMAL LOW (ref >39.00)
NonHDL: 130.18
Total CHOL/HDL Ratio: 4
Triglycerides: 288 mg/dL — ABNORMAL HIGH (ref 0.0–149.0)
VLDL: 57.6 mg/dL — ABNORMAL HIGH (ref 0.0–40.0)

## 2018-07-07 LAB — COMPREHENSIVE METABOLIC PANEL
ALT: 20 U/L (ref 0–53)
AST: 19 U/L (ref 0–37)
Albumin: 4.2 g/dL (ref 3.5–5.2)
Alkaline Phosphatase: 59 U/L (ref 39–117)
BUN: 17 mg/dL (ref 6–23)
CO2: 28 mEq/L (ref 19–32)
Calcium: 10 mg/dL (ref 8.4–10.5)
Chloride: 102 mEq/L (ref 96–112)
Creatinine, Ser: 0.99 mg/dL (ref 0.40–1.50)
GFR: 78 mL/min (ref >60.00)
Glucose, Bld: 105 mg/dL — ABNORMAL HIGH (ref 70–99)
Potassium: 4.2 mEq/L (ref 3.5–5.1)
Sodium: 138 mEq/L (ref 135–145)
Total Bilirubin: 0.6 mg/dL (ref 0.2–1.2)
Total Protein: 7.5 g/dL (ref 6.0–8.3)

## 2018-07-07 LAB — CBC
HCT: 48.1 % (ref 39.0–52.0)
Hemoglobin: 16 g/dL (ref 13.0–17.0)
MCHC: 33.3 g/dL (ref 30.0–36.0)
MCV: 90.7 fl (ref 78.0–100.0)
Platelets: 221 10*3/uL (ref 150.0–400.0)
RBC: 5.3 Mil/uL (ref 4.22–5.81)
RDW: 14.1 % (ref 11.5–15.5)
WBC: 7.6 10*3/uL (ref 4.0–10.5)

## 2018-07-07 LAB — LDL CHOLESTEROL, DIRECT: Direct LDL: 107 mg/dL

## 2018-07-07 LAB — TSH: TSH: 3.51 u[IU]/mL (ref 0.35–4.50)

## 2018-07-07 NOTE — Progress Notes (Signed)
Please inform patient of the following:  Radiologist reviewed his xray and saw the same area of fracture that we saw. The did not find any other abnormalities.

## 2018-07-10 NOTE — Progress Notes (Signed)
Please inform patient of the following:  Blood counts are normal.  Electrolytes, kidney function, and liver function levels are normal. Thyroid level is normal.  His blood sugar is mildly elevated, but stable.  His "good" cholesterol is a bit low and his "bad" cholesterol is a bit high. It would be a good idea for him to start lipitor 40mg  daily to improve his numbers and lower his risk of heart attack and stroke. Please send this in if he is willing to start it.  Kirk [name]. Jerline Pain, MD 07/10/2018 8:20 AM

## 2018-07-13 DIAGNOSIS — H401133 Primary open-angle glaucoma, bilateral, severe stage: Secondary | ICD-10-CM | POA: Diagnosis not present

## 2018-07-19 ENCOUNTER — Encounter: Payer: Self-pay | Admitting: Sports Medicine

## 2018-07-19 ENCOUNTER — Ambulatory Visit (INDEPENDENT_AMBULATORY_CARE_PROVIDER_SITE_OTHER): Payer: PPO | Admitting: Sports Medicine

## 2018-07-19 ENCOUNTER — Ambulatory Visit (INDEPENDENT_AMBULATORY_CARE_PROVIDER_SITE_OTHER): Payer: PPO

## 2018-07-19 VITALS — BP 140/80 | HR 73 | Ht 73.0 in | Wt 195.2 lb

## 2018-07-19 DIAGNOSIS — M7989 Other specified soft tissue disorders: Secondary | ICD-10-CM | POA: Diagnosis not present

## 2018-07-19 DIAGNOSIS — M25571 Pain in right ankle and joints of right foot: Secondary | ICD-10-CM

## 2018-07-19 DIAGNOSIS — S93421A Sprain of deltoid ligament of right ankle, initial encounter: Secondary | ICD-10-CM | POA: Diagnosis not present

## 2018-07-19 NOTE — Progress Notes (Signed)
PROCEDURE NOTE: THERAPEUTIC EXERCISES (97110) 15 minutes spent for Therapeutic exercises as below and as referenced in the AVS.  This included exercises focusing on stretching, strengthening, with significant focus on eccentric aspects.   Proper technique shown and discussed handout in great detail with ATC.  All questions were discussed and answered.   Long term goals include an improvement in range of motion, strength, endurance as well as avoiding reinjury. Frequency of visits is one time as determined during today's  office visit. Frequency of exercises to be performed is as per handout.  EXERCISES REVIEWED:  4 way ANKLE exercises   Calf stretching

## 2018-07-19 NOTE — Patient Instructions (Addendum)
Please perform the exercise program that we have prepared for you and gone over in detail on a daily basis.  In addition to the handout you were provided you can access your program through: [email or web address]   Your unique program code is:  [email or web address]

## 2018-07-19 NOTE — Progress Notes (Signed)
Kirk [name] at [place]  Kirk [name] - 77 y.o. male MRN [id number]  Date of birth: 1941/03/23  Visit Date: 07/19/2018  PCP: [name], MD   Referred by: [name], MD   Scribe(s) for today's visit: Wendy Poet, LAT, ATC  SUBJECTIVE:  Kirk [name] "Kirk [name]" is here for New Patient (Initial Visit) (R ankle pain)  Referred by: Dr. Jerline Pain  HPI: His R ankle pain symptoms INITIALLY: Began about 3 weeks ago when he tripped over a decorative pineapple and hit the inside of his R ankle. Described as mild bruising and swelling, nonradiating Worsened with nothing noted Improved with Diclofenac and icing Additional associated symptoms include: bruising and swelling in his R medial and post ankle; no mechanical symptoms and no N/T noted in the R ankle    At this time symptoms are improving compared to onset  He has been wearing a lace-up ASO ankle brace, taking Diclofenac and icing his R ankle.  R ankle XR - 07/06/18  REVIEW OF SYSTEMS: Denies night time disturbances. Denies fevers, chills, or night sweats. Denies unexplained weight loss. Denies personal history of cancer. Denies changes in bowel or bladder habits. Denies recent unreported falls. Denies new or worsening dyspnea or wheezing. Denies headaches or dizziness.  Denies numbness, tingling or weakness  In the extremities.  Denies dizziness or presyncopal episodes Denies lower extremity edema    HISTORY:  Prior history reviewed and updated per electronic medical record.  Social History   Occupational History  . Not on file  Tobacco Use  . Smoking status: Never Smoker  . Smokeless tobacco: Never Used  Substance and Sexual Activity  . Alcohol use: No  . Drug use: No  . Sexual activity: Not on file   Social History   Social History Narrative   Married   Lives at home with wife Pamala Hurry)   No regular exercise, but does his own  yard work and household chores.   Never smoked   Alcohol none    Past Medical History:  Diagnosis Date  . Cataract   . Congenital glaucoma    "both eyes operated on when I was 2 months old"  . Cough   . Disturbance of skin sensation   . Diverticulosis of colon 2014  . Diverticulum of esophagus, acquired   . Dysphagia, unspecified(787.20)   . Encounter for long-term (current) use of other medications   . Herpes zoster without mention of complication   . Hyperglycemia   . Hyperlipidemia   . Hypertension   . 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 Lumbago   . Nevus, non-neoplastic   . Osteoarthrosis, unspecified whether generalized or localized, unspecified site    pt. denies  . Other premature beats   . Pain in limb   . Plantar fascial fibromatosis   . Special screening for malignant neoplasm of prostate   . Unspecified glaucoma(365.9)   . Unspecified pruritic disorder   . Unspecified vitamin D deficiency   . Urinary frequency   . Zenker's diverticulum    Past Surgical History:  Procedure Laterality Date  . CATARACT EXTRACTION BILATERAL W/ ANTERIOR VITRECTOMY Bilateral 1977  . COLONOSCOPY  09/06/2013   [name]  . DIRECT LARYNGOSCOPY  10/22/2015   Cervical esophagoscopy. Endoscopic esophageal diverticulotomy.   Marland Kitchen [place]  . GLAUCOMA SURGERY  1945   congenital glaucoma  .  HERNIA REPAIR  1993  . [place]  2008  . LARYNGOSCOPY     with stapling of zenkers diverticulum   family history includes CAD in his father; Dementia in his mother; Hypertension in his mother; Kidney disease in his father. There is no history of Colon cancer, Throat cancer, Diabetes, or Liver disease.  DATA OBTAINED & REVIEWED:   Recent Labs    01/25/18 0913  HGBA1C 6.2   . 07/06/18: 2V R Ankle: ?small nondisplaced facture tip of the medial malleolus . 07/20/18: 2V R Ankle: Large calcaneal  enthesopathy. Resolved medial malleolus findings .   OBJECTIVE:  VS:  HT:6\' 1"  (185.4 cm)   WT:195 lb 3.2 oz (88.5 kg)  BMI:25.76    BP:140/80  HR:73bpm  TEMP: ( )  RESP:96 %   PHYSICAL EXAM: CONSTITUTIONAL: Well-developed, Well-nourished and In no acute distress PSYCHIATRIC: Alert & appropriately interactive. and Not depressed or anxious appearing. RESPIRATORY: No increased work of breathing and Trachea Midline EYES: Pupils are equal., EOM intact without nystagmus. and No scleral icterus.  VASCULAR EXAM: Warm and well perfused NEURO: unremarkable  MSK Exam: Right ankle  Well aligned, no significant deformity. No overlying skin changes. No focal bony pain. Mild TTP over anteriomedial & anteriolateral ankle   RANGE OF MOTION & STRENGTH  slightly decreased ankle dorsiflexion/plantarflexion but overall well maintained Normal, non-painful inversion/eversion   SPECIALITY TESTING:  stable to anterior drawer and talar tilt  good intrinsic ROM       ASSESSMENT   1. Acute right ankle pain   2. Sprain of deltoid ligament of right ankle, initial encounter     PLAN:  Pertinent additional documentation may be included in corresponding procedure notes, imaging studies, problem based documentation and patient instructions.  Procedures:  . None  Medications:  No orders of the defined types were placed in this encounter.  Discussion/Instructions: No problem-specific Assessment & Plan notes found for this encounter.  . sx consistent with ankle sprain . improving rapidily. continue anti-inflammatory . Discussed red flag symptoms that warrant earlier emergent evaluation and patient voices understanding. . Activity modifications and the importance of avoiding exacerbating activities (limiting pain to no more than a 4 / 10 during or following activity) recommended and discussed.  Follow-up:  . Return if symptoms worsen or fail to improve.   . If any lack of improvement  consider: referral to Physical Therapy  . At follow up will plan to consider: repeat X-rays     CMA/ATC served as scribe during this visit. History, Physical, and Plan performed by medical provider. Documentation and orders reviewed and attested to.      Gerda Diss, [facility] Physician

## 2018-07-20 ENCOUNTER — Encounter: Payer: Self-pay | Admitting: Sports Medicine

## 2018-11-13 ENCOUNTER — Encounter: Payer: Self-pay | Admitting: Family Medicine

## 2018-11-13 ENCOUNTER — Ambulatory Visit (INDEPENDENT_AMBULATORY_CARE_PROVIDER_SITE_OTHER): Payer: PPO | Admitting: Family Medicine

## 2018-11-13 VITALS — BP 124/72 | HR 89 | Temp 97.9°F | Ht 73.0 in | Wt 195.2 lb

## 2018-11-13 DIAGNOSIS — M5442 Lumbago with sciatica, left side: Secondary | ICD-10-CM | POA: Diagnosis not present

## 2018-11-13 DIAGNOSIS — Z6825 Body mass index (BMI) 25.0-25.9, adult: Secondary | ICD-10-CM | POA: Diagnosis not present

## 2018-11-13 DIAGNOSIS — Q809 Congenital ichthyosis, unspecified: Secondary | ICD-10-CM

## 2018-11-13 MED ORDER — DICLOFENAC SODIUM 75 MG PO TBEC: 75.0000 mg | DELAYED_RELEASE_TABLET | Freq: Two times a day (BID) | ORAL | 0 refills | Status: DC

## 2018-11-13 NOTE — Patient Instructions (Signed)
It was very nice to see you today!  Please take the diclofenac twice daily for the next 1 to 2 weeks.  Please work on exercises.  Let us know if your symptoms worsen or do not improve in the next 1 to 2 weeks.  Please make sure that you are using lotion twice daily for your legs.  It is safe for you to take over-the-counter Benadryl as needed.  Take care, Dr Jerline Pain

## 2018-11-13 NOTE — Assessment & Plan Note (Signed)
Recommended twice daily emollients.  Recommended Benadryl as needed for pruritus.

## 2018-11-13 NOTE — Progress Notes (Signed)
   Chief Complaint:  Kirk [name] is a 78 y.o. male who presents for same day appointment with a chief complaint of back pain.   Assessment/Plan:  Low back pain with sciatica No red flags.  Will start diclofenac 75 mg twice daily for the next 1 to 2 weeks.  Discussed home exercise program and handout was given.  Recommended referral to PT however patient declined.  Discussed reasons to return to care.  Follow-up as needed.  Will need referral to sports medicine if no improvement with above.  Xeroderma Recommended twice daily emollients.  Recommended Benadryl as needed for pruritus.     Subjective:  HPI:  Back Pain, acute problem Started about a week ago. Located in left lower back and radiates into left groin.  He has tried taking Tylenol which has not helped.  Pain is worse with standing and with certain maneuvers.  No weakness or numbness.  No bowel or bladder incontinence.  No fevers or chills.No other obvious alleviating or aggravating factors.   He is also concerned about dry skin on bilateral lower legs.  This is occasionally pruritic.  He has tried lotion with modest improvement.  ROS: Per HPI  PMH: He reports that he has never smoked. He has never used smokeless tobacco. He reports that he does not drink alcohol or use drugs.      Objective:  Physical Exam: BP 124/72 (BP Location: Left Arm, Patient Position: Sitting, Cuff Size: Large)   Pulse 89   Temp 97.9 F (36.6 C) (Oral)   Ht 6\' 1"  (1.854 m)   Wt 195 lb 3.2 oz (88.5 kg)   SpO2 98%   BMI 25.75 kg/m   Gen: NAD, resting comfortably MSK: -Back: No deformities.  No midline tenderness.  Nontender to palpation.  Full range of motion throughout -Lower extremities: Strength out of 5 throughout.  Pain elicited with resisted left hip abduction and extension.  Neurovascular intact distally.  Straight leg raise positive on left.  Straight leg raise test negative on the right. Skin: Dry appearing skin on bilateral lower  extremities.     Algis Greenhouse. Jerline Pain, MD 11/13/2018 3:43 PM

## 2018-11-30 DIAGNOSIS — M545 Low back pain: Secondary | ICD-10-CM | POA: Diagnosis not present

## 2018-11-30 DIAGNOSIS — M25571 Pain in right ankle and joints of right foot: Secondary | ICD-10-CM | POA: Diagnosis not present

## 2018-12-08 ENCOUNTER — Ambulatory Visit (INDEPENDENT_AMBULATORY_CARE_PROVIDER_SITE_OTHER): Payer: PPO | Admitting: Sports Medicine

## 2018-12-08 ENCOUNTER — Ambulatory Visit (INDEPENDENT_AMBULATORY_CARE_PROVIDER_SITE_OTHER): Payer: PPO

## 2018-12-08 ENCOUNTER — Encounter: Payer: Self-pay | Admitting: Sports Medicine

## 2018-12-08 VITALS — BP 136/78 | HR 102 | Ht 73.0 in | Wt 192.8 lb

## 2018-12-08 DIAGNOSIS — M79605 Pain in left leg: Secondary | ICD-10-CM

## 2018-12-08 DIAGNOSIS — M48062 Spinal stenosis, lumbar region with neurogenic claudication: Secondary | ICD-10-CM | POA: Diagnosis not present

## 2018-12-08 DIAGNOSIS — S79912A Unspecified injury of left hip, initial encounter: Secondary | ICD-10-CM | POA: Diagnosis not present

## 2018-12-08 DIAGNOSIS — M1612 Unilateral primary osteoarthritis, left hip: Secondary | ICD-10-CM

## 2018-12-08 DIAGNOSIS — M5442 Lumbago with sciatica, left side: Secondary | ICD-10-CM

## 2018-12-08 DIAGNOSIS — M79604 Pain in right leg: Secondary | ICD-10-CM | POA: Diagnosis not present

## 2018-12-08 DIAGNOSIS — R1030 Lower abdominal pain, unspecified: Secondary | ICD-10-CM | POA: Diagnosis not present

## 2018-12-08 MED ORDER — PREDNISONE 50 MG PO TABS: 50.0000 mg | ORAL_TABLET | Freq: Every day | ORAL | 0 refills | Status: AC

## 2018-12-08 NOTE — Progress Notes (Signed)
Kirk [name]. Kirk [name], [place] at [facility] [phone]  Kirk [name] - 78 y.o. male MRN [id number]  Date of birth: Mar 24, 1941  Visit Date: December 08, 2018  PCP: Kirk Barrack, MD   Referred by: Kirk Barrack, MD  SUBJECTIVE:  Chief Complaint  Patient presents with  . Initial Assessment    LBP, groin and L leg pain.  Diclofenac 75 mg BID.  HEP from [place]    HPI: Patient presents for bilateral leg and low back pain.  Is been going on for over a month.  He did have a week ago those are located on both severity of his knees he has been by orthopedics at [facility] who is recommended home health PT but this is not been followed through on.  He has been using a walker at home and a cane while out of the house however he is unstable.  He is tried Tylenol, diclofenac twice a day prescribed Dr. Jerline Pain that provided no benefit.  The pain originally was localizing to the left side it is now both.  He describes weakness.  REVIEW OF SYSTEMS: No significant nighttime awakenings due to this issue. Denies fevers, chills, recent weight gain or weight loss.  No night sweats.  Pt denies any change in bowel or bladder habits, muscle weakness, numbness or falls associated with this pain. He did have a fall as outlined above.  He is having weakness.  HISTORY:  Prior history reviewed and updated per electronic medical record.  Patient Active Problem List   Diagnosis Date Noted  . Acute right ankle pain 07/19/2018  . TSH elevation 02/25/2016  . Orthostatic hypotension 02/17/2016  . Syncope 01/07/2016  . 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 Bifascicular block 08/20/2015    LAFB and RBBB   . Neuropathy ([place]) 02/05/2015  . Diverticulosis of colon   . Vitamin D deficiency   . Hyperglycemia   . Dysphagia   . Diverticulum of esophagus, acquired     2006 Barium swallow showwed Zenker's diverticulum,, HH and Schatzki  ring. Saw Dr. Loren Racer, ENT in the past.   . Hyperlipidemia     Lab Results  Component Value Date   CHOL 168 07/06/2018   HDL 37.50 (L) 07/06/2018   LDLCALC 92 01/25/2018   LDLDIRECT 107.0 07/06/2018   TRIG 288.0 (H) 07/06/2018   CHOLHDL 4 07/06/2018    The 10-year ASCVD risk score Mikey Bussing DC Jr., et al., 2013) is: 34.7%   Values used to calculate the score:     Age: 21 years     Sex: Male     Is Non-Hispanic African American: No     Diabetic: No     Tobacco smoker: No     Systolic Blood Pressure: 086 mmHg     Is BP treated: Yes     HDL Cholesterol: 37.5 mg/dL     Total Cholesterol: 168 mg/dL    . Glaucoma   . BPH (benign prostatic hyperplasia)   . Essential hypertension 02/01/2013  . 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 Not on file  Tobacco Use  . Smoking status: Never Smoker  . Smokeless tobacco: Never Used  Substance and Sexual Activity  . Alcohol use: No  . Drug use: No  . Sexual activity: Not on file   Social History   Social History Narrative   Married   Lives at  home with wife Kirk [name])   No regular exercise, but does his own yard work and household chores.   Never smoked   Alcohol none    OBJECTIVE:  VS:  HT:6\' 1"  (185.4 cm)   WT:192 lb 12.8 oz (87.5 kg)  BMI:25.44    BP:136/78  HR:(!) 102bpm  TEMP: ( )  RESP:96 %   PHYSICAL EXAM: Adult male. No acute distress.  Alert and appropriate. Bilateral legs are atrophic.  He has poor muscle definition.  Slight extensor lag of bilateral knees with markedly tight hamstrings in a crouched for hip flexed gait.  He has good internal and external rotation of bilateral hips.  Left is slightly diminished compared to the right.  He has some pain with axial load and circumduction of the hip.  His lower extremity strength is 5/5 and manual muscle testing bilateral lower extremities his sensation is intact light touch.  X-rays of the AP pelvis and left frog leg shows some mild  degenerative changes in the left hip but this is minimal.     ASSESSMENT:  1. Bilateral leg pain   2. Acute left-sided low back pain with left-sided sciatica   3. Neurogenic claudication due to lumbar spinal stenosis   4. Primary osteoarthritis of left hip     PROCEDURES:  None  PLAN:  Pertinent additional documentation may be included in corresponding procedure notes, imaging studies, problem based documentation and patient instructions.  No problem-specific Assessment & Plan notes found for this encounter.   Long discussion with patient today.  Ultimately given the significant discomfort that he is having with day-to-day function and likely neurogenic claudication in the setting of lumbar spondylosis short course of prednisone for 5 days recommended.  He does have underlying glaucoma and we discussed the slight increased risk for increased IOP.  We also discussed the other side effects of steroid including increased appetite, weight gain, fluid retention, mood changes.  Referral to physical therapy placed today, we will have him come to our office since this is only 1 mile from his house.   Activity modifications and the importance of avoiding exacerbating activities (limiting pain to no more than a 4 / 10 during or following activity) recommended and discussed.  Discussed red flag symptoms that warrant earlier emergent evaluation and patient voices understanding.  Highly encouraged the use of a walker while at home and outside of the home as he is unsteady on the cane.   Meds ordered this encounter  Medications  . predniSONE (DELTASONE) 50 MG tablet    Sig: Take 1 tablet (50 mg total) by mouth daily with breakfast for 5 days.    Dispense:  5 tablet    Refill:  0   Lab Orders  No laboratory test(s) ordered today    Imaging Orders     DG HIP UNILAT W OR W/O PELVIS 2-3 VIEWS LEFT  Referral Orders     Ambulatory referral to Physical Therapy  Return if symptoms worsen or  fail to improve.          Gerda Diss, [facility] Physician

## 2018-12-11 ENCOUNTER — Ambulatory Visit: Payer: PPO | Admitting: Physical Therapy

## 2018-12-11 DIAGNOSIS — M545 Low back pain, unspecified: Secondary | ICD-10-CM

## 2018-12-11 DIAGNOSIS — M79605 Pain in left leg: Secondary | ICD-10-CM

## 2018-12-11 DIAGNOSIS — M79604 Pain in right leg: Secondary | ICD-10-CM | POA: Diagnosis not present

## 2018-12-13 ENCOUNTER — Encounter: Payer: Self-pay | Admitting: Physical Therapy

## 2018-12-13 ENCOUNTER — Other Ambulatory Visit: Payer: Self-pay

## 2018-12-13 ENCOUNTER — Ambulatory Visit: Payer: PPO | Admitting: Physical Therapy

## 2018-12-13 DIAGNOSIS — M545 Low back pain, unspecified: Secondary | ICD-10-CM

## 2018-12-13 DIAGNOSIS — M79605 Pain in left leg: Secondary | ICD-10-CM | POA: Diagnosis not present

## 2018-12-13 DIAGNOSIS — M79604 Pain in right leg: Secondary | ICD-10-CM

## 2018-12-13 NOTE — Therapy (Signed)
[facility] [address] [place], [place], [address] Phone: [phone]   Fax:  [phone]  Physical Therapy Evaluation  Patient Details  Name: Kirk [name] MRN: [id number] Date of Birth: May 29, 1941 Referring Provider (PT): Teresa Coombs   Encounter Date: 12/11/2018  PT End of Session - 12/13/18 1355    Visit Number  1    Number of Visits  12    Date for PT Re-Evaluation  01/22/19    Authorization Type  HTA    PT Start Time  1515    PT Stop Time  1555    PT Time Calculation (min)  40 min    Activity Tolerance  Patient tolerated treatment well    Behavior During Therapy  [facility] for tasks assessed/performed;Flat affect       Past Medical History:  Diagnosis Date  . Cataract   . Congenital glaucoma    "both eyes operated on when I was 26 months old"  . Cough   . Disturbance of skin sensation   . Diverticulosis of colon 2014  . Diverticulum of esophagus, acquired   . Dysphagia, unspecified(787.20)   . Encounter for long-term (current) use of other medications   . Herpes zoster without mention of complication   . Hyperglycemia   . Hyperlipidemia   . Hypertension   . 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 Lumbago   . Nevus, non-neoplastic   . Osteoarthrosis, unspecified whether generalized or localized, unspecified site    pt. denies  . Other premature beats   . Pain in limb   . Plantar fascial fibromatosis   . Special screening for malignant neoplasm of prostate   . Unspecified glaucoma(365.9)   . Unspecified pruritic disorder   . Unspecified vitamin D deficiency   . Urinary frequency   . Zenker's diverticulum     Past Surgical History:  Procedure Laterality Date  . CATARACT EXTRACTION BILATERAL W/ ANTERIOR VITRECTOMY Bilateral 1977  . COLONOSCOPY  09/06/2013   [name]  . DIRECT LARYNGOSCOPY  10/22/2015   Cervical esophagoscopy. Endoscopic esophageal  diverticulotomy.   Marland Kitchen [place]  . GLAUCOMA SURGERY  1945   congenital glaucoma  . HERNIA REPAIR  1993  . [place]  2008  . LARYNGOSCOPY     with stapling of zenkers diverticulum    There were no vitals filed for this visit.       [facility] PT Assessment - 12/13/18 0001      Assessment   Medical Diagnosis  Bil LE Pain    Referring Provider (PT)  Teresa Coombs    Prior Therapy  No      Precautions   Precautions  Fall      Restrictions   Weight Bearing Restrictions  No      Balance Screen   Has the patient fallen in the past 6 months  Yes    How many times?  1    Has the patient had a decrease in activity level because of a fear of falling?   No    Is the patient reluctant to leave their home because of a fear of falling?   No      Prior Function   Level of Independence  Independent with household mobility with device      Cognition   Overall Cognitive Status  Within Functional Limits for tasks assessed    Behaviors  --  Requires mild accomodation for poor vision.      ROM / Strength   AROM / PROM / Strength  AROM;Strength      AROM   Overall AROM Comments  Lumbar: mod limitation for SB, mod-significant limitation for extension;   Hips: WFL;       Strength   Overall Strength Comments  Hips: 4-/5; Knee: 4/5       Palpation   Palpation comment  Hypomobile lumbar spine, Hip motion WFL;  Soreness with PA spring of lumbar region      Special Tests   Other special tests  Neg FABER/FAIR      Ambulation/Gait   Gait Comments  low step height, forward trunk lean, decreased balance, ineffective use of SPC   Standing Dynamic Balance: fair +     Balance   Balance Assessed  Yes                Objective measurements completed on examination: See above findings.              PT Education - 12/13/18 1355    Education Details  PT POC,     Person(s) Educated  Patient    Methods  Explanation    Comprehension   Verbalized understanding       PT Short Term Goals - 12/13/18 1408      PT SHORT TERM GOAL #1   Title  Pt to demo ability for sit to stand x1 with no use of UEs.     Time  2    Period  Weeks    Status  New    Target Date  12/25/18      PT SHORT TERM GOAL #2   Title  Pt to be independent with initial HEp    Time  2    Period  Weeks    Status  New    Target Date  12/25/18        PT Long Term Goals - 12/13/18 1405      PT LONG TERM GOAL #1   Title  Pt to report decreased pain in LEs to 0-2/10 with initial standing and ambulation     Time  6    Period  Weeks    Status  New    Target Date  01/22/19      PT LONG TERM GOAL #2   Title  Pt to demo ability for independent final HEP for back and LE mobility and strength.     Time  6    Period  Weeks    Status  New    Target Date  01/22/19      PT LONG TERM GOAL #3   Title  Pt to demo improved ambulation pattern, to be wfl for pt age, for at least 500 ft, with LRAD, and balance WFL, to improve safety and endurance with community activity     Baseline  Pt with decreased balance, and inefficient use of SPC.     Time  6    Period  Weeks    Status  New    Target Date  01/22/19      PT LONG TERM GOAL #4   Title  Pt to demo ability for sit to stand without use of UEs, x5, on 1st try.     Time  6    Period  Weeks    Status  New    Target Date  01/22/19  Plan - 12/13/18 1358    Clinical Impression Statement  Pt presents with primary complaint of pain in LEs, increased with initial standing, as well as prolonged standing/walking. Pt with hip ROM WFL. He has decreased lumbar ROM, and decreased strength of hips and LEs. He has lack of effective HEP and is very deconditioned. Pt with decreased standing dynamic balance, and prefers use of AD at this time. Will educate on proper use of SPC when pt returns for improved safety. He has decreased ability for functional reach, lift, and gait, due to deconditioning and  balance. Pt with decreased ability for full functional activities, due to pain and deficits. Pt to benefit from LE strenth, gait, balance, and pain reduction in LEs/ back.     Personal Factors and Comorbidities  Age    Examination-Activity Limitations  Stairs;Lift;Bend;Stand;Transfers    Examination-Participation Restrictions  Cleaning;Yard Work    [employer] officer  Evolving/Moderate complexity    Clinical Decision Making  Moderate    Rehab Potential  Good    PT Frequency  2x / week    PT Duration  6 weeks    PT Treatment/Interventions  ADLs/Self Care Home Management;Moist Heat;Traction;Ultrasound;Functional mobility training;Stair training;Gait training;DME Instruction;Therapeutic activities;Therapeutic exercise;Balance training;Neuromuscular re-education;Manual techniques;Patient/family education;Orthotic Fit/Training;Passive range of motion;Energy conservation;Taping;Dry needling;Spinal Manipulations;Joint Manipulations    Consulted and Agree with Plan of Care  Patient       Patient will benefit from skilled therapeutic intervention in order to improve the following deficits and impairments:  Abnormal gait, Pain, Improper body mechanics, Increased muscle spasms, Hypermobility, Decreased mobility, Decreased activity tolerance, Decreased endurance, Decreased range of motion, Decreased strength, Difficulty walking, Decreased balance  Visit Diagnosis: Bilateral leg pain  Acute bilateral low back pain without sciatica     Problem List Patient Active Problem List   Diagnosis Date Noted  . Acute right ankle pain 07/19/2018  . TSH elevation 02/25/2016  . Orthostatic hypotension 02/17/2016  . Syncope 01/07/2016  . 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 Bifascicular block 08/20/2015  . Neuropathy ([place]) 02/05/2015  . Diverticulosis of colon   . Vitamin D deficiency   . Hyperglycemia   . Dysphagia   . Diverticulum of esophagus, acquired    . Hyperlipidemia   . Glaucoma   . BPH (benign prostatic hyperplasia)   . Essential hypertension 02/01/2013  . 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

## 2018-12-13 NOTE — Patient Instructions (Signed)
Access Code: [email or web address]  URL: [email or web address]  Date: 12/13/2018  Prepared by: Lyndee Hensen   Exercises  Single Knee to Chest Stretch - 3 reps - 30 hold - 2x daily  Supine Lower Trunk Rotation - 10 reps - 10 hold - 2x daily  Supine March - 10 reps - 2 sets - 3 hold - 2x daily  Sidelying Hip Abduction - 10 reps - 1-2 sets - 2x daily  Standing Hip Abduction - 10 reps - 1-2 sets - 2x daily  Seated Hamstring Stretch - 3 reps - 30 hold - 2x daily

## 2018-12-13 NOTE — Therapy (Signed)
[facility] [address] [place], [place], [address] Phone: [phone]   Fax:  [phone]  Physical Therapy Treatment  Patient Details  Name: Kirk [name] MRN: [id number] Date of Birth: 1941/05/10 Referring Provider (PT): Teresa Coombs   Encounter Date: 12/13/2018  PT End of Session - 12/13/18 1419    Visit Number  2    Number of Visits  12    Date for PT Re-Evaluation  01/22/19    Authorization Type  HTA    PT Start Time  1254    PT Stop Time  1343    PT Time Calculation (min)  49 min    Activity Tolerance  Patient tolerated treatment well    Behavior During Therapy  [facility] for tasks assessed/performed;Flat affect       Past Medical History:  Diagnosis Date  . Cataract   . Congenital glaucoma    "both eyes operated on when I was 12 months [name]"  . Cough   . Disturbance of skin sensation   . Diverticulosis of colon 2014  . Diverticulum of esophagus, acquired   . Dysphagia, unspecified(787.20)   . Encounter for long-term (current) use of other medications   . Herpes zoster without mention of complication   . Hyperglycemia   . Hyperlipidemia   . Hypertension   . 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 Lumbago   . Nevus, non-neoplastic   . Osteoarthrosis, unspecified whether generalized or localized, unspecified site    pt. denies  . Other premature beats   . Pain in limb   . Plantar fascial fibromatosis   . Special screening for malignant neoplasm of prostate   . Unspecified glaucoma(365.9)   . Unspecified pruritic disorder   . Unspecified vitamin D deficiency   . Urinary frequency   . Zenker's diverticulum     Past Surgical History:  Procedure Laterality Date  . CATARACT EXTRACTION BILATERAL W/ ANTERIOR VITRECTOMY Bilateral 1977  . COLONOSCOPY  09/06/2013   [name]  . DIRECT LARYNGOSCOPY  10/22/2015   Cervical esophagoscopy. Endoscopic esophageal  diverticulotomy.   Marland Kitchen [place]  . GLAUCOMA SURGERY  1945   congenital glaucoma  . HERNIA REPAIR  1993  . [place]  2008  . LARYNGOSCOPY     with stapling of zenkers diverticulum    There were no vitals filed for this visit.  Subjective Assessment - 12/13/18 1418    Subjective  Pt states no change in symptoms, main complaint is pain in LEs with initial standing. Pt using wifes 4 WW today, states for balance.     Currently in Pain?  Yes    Pain Score  4     Pain Location  Leg    Pain Orientation  Right;Left    Pain Descriptors / Indicators  Sore    Pain Type  Acute pain    Pain Onset  More than a month ago    Pain Frequency  Intermittent                       [facility] Adult PT Treatment/Exercise - 12/13/18 1421      Exercises   Exercises  Lumbar      Lumbar Exercises: Stretches   Active Hamstring Stretch  3 reps;30 seconds    Active Hamstring Stretch Limitations  seated    Single Knee to Chest Stretch  3 reps;30 seconds;Right;Left  Lower Trunk Rotation  5 reps;10 seconds      Lumbar Exercises: Aerobic   Stationary Bike  L1 x 7 min      Lumbar Exercises: Standing   Other Standing Lumbar Exercises  Hip Abd 1x10 bil;        Lumbar Exercises: Seated   Long Arc Quad on Chair  10 reps;Both    Sit to Stand  10 reps    Sit to Stand Limitations  use of 1 UE,      Lumbar Exercises: Supine   Bent Knee Raise  15 reps             PT Education - 12/13/18 1419    Education Details  Initial HEp given    Person(s) Educated  Patient    Methods  Explanation;Handout;Demonstration;Tactile cues;Verbal cues    Comprehension  Verbalized understanding;Need further instruction       PT Short Term Goals - 12/13/18 1408      PT SHORT TERM GOAL #1   Title  Pt to demo ability for sit to stand x1 with no use of UEs.     Time  2    Period  Weeks    Status  New    Target Date  12/25/18      PT SHORT TERM GOAL #2   Title  Pt to  be independent with initial HEp    Time  2    Period  Weeks    Status  New    Target Date  12/25/18        PT Long Term Goals - 12/13/18 1405      PT LONG TERM GOAL #1   Title  Pt to report decreased pain in LEs to 0-2/10 with initial standing and ambulation     Time  6    Period  Weeks    Status  New    Target Date  01/22/19      PT LONG TERM GOAL #2   Title  Pt to demo ability for independent final HEP for back and LE mobility and strength.     Time  6    Period  Weeks    Status  New    Target Date  01/22/19      PT LONG TERM GOAL #3   Title  Pt to demo improved ambulation pattern, to be wfl for pt age, for at least 500 ft, with LRAD, and balance WFL, to improve safety and endurance with community activity     Baseline  Pt with decreased balance, and inefficient use of SPC.     Time  6    Period  Weeks    Status  New    Target Date  01/22/19      PT LONG TERM GOAL #4   Title  Pt to demo ability for sit to stand without use of UEs, x5, on 1st try.     Time  6    Period  Weeks    Status  New    Target Date  01/22/19            Plan - 12/13/18 1419    Clinical Impression Statement  Pt educated on initial HEP today, for LE strength and lumbar mobility. Pt will require further review of this. Discussed use of walker with pt, did make walker higher for improved posture. Will practice sequencing and use of SPC at next visit. Pt likely not in need of RW at  this time. Pt with difficulty with ther ex today due to muscle weakness and deconditioning. Plan to progress as tolerated.     Personal Factors and Comorbidities  Age    Examination-Activity Limitations  Stairs;Lift;Bend;Stand;Transfers    Examination-Participation Restrictions  Cleaning;Yard Work    [employer]chant [employer] officer  Evolving/Moderate complexity    Rehab Potential  Good    PT Frequency  2x / week    PT Duration  6 weeks    PT Treatment/Interventions  ADLs/Self Care Home Management;Moist  Heat;Traction;Ultrasound;Functional mobility training;Stair training;Gait training;DME Instruction;Therapeutic activities;Therapeutic exercise;Balance training;Neuromuscular re-education;Manual techniques;Patient/family education;Orthotic Fit/Training;Passive range of motion;Energy conservation;Taping;Dry needling;Spinal Manipulations;Joint Manipulations    Consulted and Agree with Plan of Care  Patient       Patient will benefit from skilled therapeutic intervention in order to improve the following deficits and impairments:  Abnormal gait, Pain, Improper body mechanics, Increased muscle spasms, Hypermobility, Decreased mobility, Decreased activity tolerance, Decreased endurance, Decreased range of motion, Decreased strength, Difficulty walking, Decreased balance  Visit Diagnosis: Bilateral leg pain  Acute bilateral low back pain without sciatica     Problem List Patient Active Problem List   Diagnosis Date Noted  . Acute right ankle pain 07/19/2018  . TSH elevation 02/25/2016  . Orthostatic hypotension 02/17/2016  . Syncope 01/07/2016  . 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 Bifascicular block 08/20/2015  . Neuropathy ([place]) 02/05/2015  . Diverticulosis of colon   . Vitamin D deficiency   . Hyperglycemia   . Dysphagia   . Diverticulum of esophagus, acquired   . Hyperlipidemia   . Glaucoma   . BPH (benign prostatic hyperplasia)   . Essential hypertension 02/01/2013  . 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    Lyndee Hensen, PT, DPT 2:24 PM  12/13/18    [place], [place], [address] Phone: [phone]   Fax:  [phone]  Name: Kirk [name] MRN: [id number] Date of Birth: 1941-05-02

## 2018-12-14 DIAGNOSIS — H01005 Unspecified blepharitis left lower eyelid: Secondary | ICD-10-CM | POA: Diagnosis not present

## 2018-12-14 DIAGNOSIS — H01004 Unspecified blepharitis left upper eyelid: Secondary | ICD-10-CM | POA: Diagnosis not present

## 2018-12-14 DIAGNOSIS — H01002 Unspecified blepharitis right lower eyelid: Secondary | ICD-10-CM | POA: Diagnosis not present

## 2018-12-14 DIAGNOSIS — H01001 Unspecified blepharitis right upper eyelid: Secondary | ICD-10-CM | POA: Diagnosis not present

## 2018-12-19 ENCOUNTER — Ambulatory Visit: Payer: PPO | Admitting: Physical Therapy

## 2018-12-19 ENCOUNTER — Encounter: Payer: Self-pay | Admitting: Physical Therapy

## 2018-12-19 ENCOUNTER — Other Ambulatory Visit: Payer: Self-pay

## 2018-12-19 DIAGNOSIS — M79605 Pain in left leg: Secondary | ICD-10-CM

## 2018-12-19 DIAGNOSIS — M79604 Pain in right leg: Secondary | ICD-10-CM

## 2018-12-19 DIAGNOSIS — M545 Low back pain, unspecified: Secondary | ICD-10-CM

## 2018-12-19 NOTE — Therapy (Signed)
[facility] [address] [place], [place], [address] Phone: [phone]   Fax:  [phone]  Physical Therapy Treatment  Patient Details  Name: Kirk [name] MRN: [id number] Date of Birth: 07-11-1941 Referring Provider (PT): Teresa Coombs   Encounter Date: 12/19/2018  PT End of Session - 12/19/18 1655    Visit Number  3    Number of Visits  12    Date for PT Re-Evaluation  01/22/19    Authorization Type  HTA    PT Start Time  1600    PT Stop Time  7893    PT Time Calculation (min)  44 min    Activity Tolerance  Patient tolerated treatment well    Behavior During Therapy  [facility] for tasks assessed/performed;Flat affect       Past Medical History:  Diagnosis Date  . Cataract   . Congenital glaucoma    "both eyes operated on when I was 57 months old"  . Cough   . Disturbance of skin sensation   . Diverticulosis of colon 2014  . Diverticulum of esophagus, acquired   . Dysphagia, unspecified(787.20)   . Encounter for long-term (current) use of other medications   . Herpes zoster without mention of complication   . Hyperglycemia   . Hyperlipidemia   . Hypertension   . 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 Lumbago   . Nevus, non-neoplastic   . Osteoarthrosis, unspecified whether generalized or localized, unspecified site    pt. denies  . Other premature beats   . Pain in limb   . Plantar fascial fibromatosis   . Special screening for malignant neoplasm of prostate   . Unspecified glaucoma(365.9)   . Unspecified pruritic disorder   . Unspecified vitamin D deficiency   . Urinary frequency   . Zenker's diverticulum     Past Surgical History:  Procedure Laterality Date  . CATARACT EXTRACTION BILATERAL W/ ANTERIOR VITRECTOMY Bilateral 1977  . COLONOSCOPY  09/06/2013   [name]  . DIRECT LARYNGOSCOPY  10/22/2015   Cervical esophagoscopy. Endoscopic esophageal  diverticulotomy.   Marland Kitchen [place]  . GLAUCOMA SURGERY  1945   congenital glaucoma  . HERNIA REPAIR  1993  . [place]  2008  . LARYNGOSCOPY     with stapling of zenkers diverticulum    There were no vitals filed for this visit.  Subjective Assessment - 12/19/18 1654    Subjective  Pt states he has been using cane and walker at home. He brought another 4 [employer] today. States he is still having pain in legs with initial standing.     Currently in Pain?  Yes    Pain Score  4     Pain Location  Leg    Pain Orientation  Right;Left    Pain Descriptors / Indicators  Sore    Pain Type  Acute pain    Pain Onset  More than a month ago    Pain Frequency  Intermittent                       [facility] Adult PT Treatment/Exercise - 12/19/18 1615      Ambulation/Gait   Gait Comments  35 ft x6 with SPC, 35 ft x4 with RW;       Exercises   Exercises  Lumbar      Lumbar Exercises: Stretches   Active Hamstring Stretch  3 reps;30 seconds    Active Hamstring Stretch Limitations  seated    Single Knee to Chest Stretch  --    Lower Trunk Rotation  --      Lumbar Exercises: Aerobic   Stationary Bike  --      Lumbar Exercises: Standing   Other Standing Lumbar Exercises  Hip Abd 2x10 bil; March x20;     Other Standing Lumbar Exercises  Standing L/R wight shifts x25;  Tandem stance 30 sec x2 bil;        Lumbar Exercises: Seated   Long Arc Quad on Chair  10 reps;Both    Sit to Stand  10 reps    Sit to Stand Limitations  minimal use of UEs.       Lumbar Exercises: Supine   Bent Knee Raise  --               PT Short Term Goals - 12/13/18 1408      PT SHORT TERM GOAL #1   Title  Pt to demo ability for sit to stand x1 with no use of UEs.     Time  2    Period  Weeks    Status  New    Target Date  12/25/18      PT SHORT TERM GOAL #2   Title  Pt to be independent with initial HEp    Time  2    Period  Weeks    Status  New    Target Date   12/25/18        PT Long Term Goals - 12/13/18 1405      PT LONG TERM GOAL #1   Title  Pt to report decreased pain in LEs to 0-2/10 with initial standing and ambulation     Time  6    Period  Weeks    Status  New    Target Date  01/22/19      PT LONG TERM GOAL #2   Title  Pt to demo ability for independent final HEP for back and LE mobility and strength.     Time  6    Period  Weeks    Status  New    Target Date  01/22/19      PT LONG TERM GOAL #3   Title  Pt to demo improved ambulation pattern, to be wfl for pt age, for at least 500 ft, with LRAD, and balance WFL, to improve safety and endurance with community activity     Baseline  Pt with decreased balance, and inefficient use of SPC.     Time  6    Period  Weeks    Status  New    Target Date  01/22/19      PT LONG TERM GOAL #4   Title  Pt to demo ability for sit to stand without use of UEs, x5, on 1st try.     Time  6    Period  Weeks    Status  New    Target Date  01/22/19            Plan - 12/19/18 1657    Clinical Impression Statement  Pt educated on sequencing and use of SPC today, pt with improved ability for proper sequencing after education and practice, but will lilkely still need more practice. Pt safe for short distance ambulation with SPC. Height of 4 WW adjusted for proper pt height, pt cued for improivng upper  body posture with use of 4 [employer]. Pt with much difficulty with balance today. he states fatigue of hip and thigh muscles after ther ex today. Plan to progress as tolerated.     Personal Factors and Comorbidities  Age    Examination-Activity Limitations  Stairs;Lift;Bend;Stand;Transfers    Examination-Participation Restrictions  Cleaning;Yard Work    [employer]chant [employer] officer  Evolving/Moderate complexity    Rehab Potential  Good    PT Frequency  2x / week    PT Duration  6 weeks    PT Treatment/Interventions  ADLs/Self Care Home Management;Moist Heat;Traction;Ultrasound;Functional  mobility training;Stair training;Gait training;DME Instruction;Therapeutic activities;Therapeutic exercise;Balance training;Neuromuscular re-education;Manual techniques;Patient/family education;Orthotic Fit/Training;Passive range of motion;Energy conservation;Taping;Dry needling;Spinal Manipulations;Joint Manipulations    Consulted and Agree with Plan of Care  Patient       Patient will benefit from skilled therapeutic intervention in order to improve the following deficits and impairments:  Abnormal gait, Pain, Improper body mechanics, Increased muscle spasms, Hypermobility, Decreased mobility, Decreased activity tolerance, Decreased endurance, Decreased range of motion, Decreased strength, Difficulty walking, Decreased balance  Visit Diagnosis: Bilateral leg pain  Acute bilateral low back pain without sciatica     Problem List Patient Active Problem List   Diagnosis Date Noted  . Acute right ankle pain 07/19/2018  . TSH elevation 02/25/2016  . Orthostatic hypotension 02/17/2016  . Syncope 01/07/2016  . 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 Bifascicular block 08/20/2015  . Neuropathy ([place]) 02/05/2015  . Diverticulosis of colon   . Vitamin D deficiency   . Hyperglycemia   . Dysphagia   . Diverticulum of esophagus, acquired   . Hyperlipidemia   . Glaucoma   . BPH (benign prostatic hyperplasia)   . Essential hypertension 02/01/2013  . 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    Lyndee Hensen, PT, DPT 4:59 PM  12/19/18    [facility] [place], [place], [address] Phone: [phone]   Fax:  [phone]  Name: Kirk [name] MRN: [id number] Date of Birth: Feb 24, 1941

## 2018-12-21 ENCOUNTER — Encounter: Payer: Self-pay | Admitting: Physical Therapy

## 2018-12-21 ENCOUNTER — Other Ambulatory Visit: Payer: Self-pay

## 2018-12-21 ENCOUNTER — Ambulatory Visit: Payer: PPO | Admitting: Physical Therapy

## 2018-12-21 DIAGNOSIS — M545 Low back pain, unspecified: Secondary | ICD-10-CM

## 2018-12-21 DIAGNOSIS — M79605 Pain in left leg: Secondary | ICD-10-CM | POA: Diagnosis not present

## 2018-12-21 DIAGNOSIS — M79604 Pain in right leg: Secondary | ICD-10-CM

## 2018-12-21 NOTE — Therapy (Signed)
[facility] [address] [place], [place], [address] Phone: [phone]   Fax:  [phone]  Physical Therapy Treatment  Patient Details  Name: Kirk [name] MRN: [id number] Date of Birth: 22-Jun-1941 Referring Provider (PT): Teresa Coombs   Encounter Date: 12/21/2018  PT End of Session - 12/21/18 1535    Visit Number  4    Number of Visits  12    Date for PT Re-Evaluation  01/22/19    Authorization Type  HTA    PT Start Time  1430    PT Stop Time  1516    PT Time Calculation (min)  46 min    Activity Tolerance  Patient tolerated treatment well    Behavior During Therapy  [facility] for tasks assessed/performed;Flat affect       Past Medical History:  Diagnosis Date  . Cataract   . Congenital glaucoma    "both eyes operated on when I was 78 months old"  . Cough   . Disturbance of skin sensation   . Diverticulosis of colon 2014  . Diverticulum of esophagus, acquired   . Dysphagia, unspecified(787.20)   . Encounter for long-term (current) use of other medications   . Herpes zoster without mention of complication   . Hyperglycemia   . Hyperlipidemia   . Hypertension   . 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 Lumbago   . Nevus, non-neoplastic   . Osteoarthrosis, unspecified whether generalized or localized, unspecified site    pt. denies  . Other premature beats   . Pain in limb   . Plantar fascial fibromatosis   . Special screening for malignant neoplasm of prostate   . Unspecified glaucoma(365.9)   . Unspecified pruritic disorder   . Unspecified vitamin D deficiency   . Urinary frequency   . Zenker's diverticulum     Past Surgical History:  Procedure Laterality Date  . CATARACT EXTRACTION BILATERAL W/ ANTERIOR VITRECTOMY Bilateral 1977  . COLONOSCOPY  09/06/2013   [name]  . DIRECT LARYNGOSCOPY  10/22/2015   Cervical esophagoscopy. Endoscopic esophageal  diverticulotomy.   Marland Kitchen [place]  . GLAUCOMA SURGERY  1945   congenital glaucoma  . HERNIA REPAIR  1993  . [place]  2008  . LARYNGOSCOPY     with stapling of zenkers diverticulum    There were no vitals filed for this visit.  Subjective Assessment - 12/21/18 1534    Subjective  Pt states increased soreness in thigh region during and after last visit. He has been using RW out of house, and [facility] in the house.     Patient Stated Goals  decreased pain    Currently in Pain?  Yes    Pain Score  5     Pain Location  Leg    Pain Orientation  Right;Left    Pain Descriptors / Indicators  Aching    Pain Type  Acute pain    Pain Onset  In the past 7 days    Pain Frequency  Intermittent    Aggravating Factors   initial standing, marching, exercise    Pain Relieving Factors  rest                       [facility] Adult PT Treatment/Exercise - 12/21/18 1431      Ambulation/Gait   Gait Comments  35 ft x6 with SPC, 35 ft x4 with RW;  Exercises   Exercises  Lumbar      Lumbar Exercises: Stretches   Active Hamstring Stretch  3 reps;30 seconds    Active Hamstring Stretch Limitations  seated    Single Knee to Chest Stretch  3 reps;30 seconds;Right;Left      Lumbar Exercises: Aerobic   Stationary Bike  L1 x 3 min (pt requests to be done)       Lumbar Exercises: Standing   Other Standing Lumbar Exercises  Hip Abd 2x10 bil;    Other Standing Lumbar Exercises  Standing L/R wight shifts x25;  Tandem stance 30 sec x2 bil;        Lumbar Exercises: Seated   Long Arc Quad on Chair  10 reps;Both    Sit to Stand  10 reps    Sit to Stand Limitations  minimal use of UEs.       Manual Therapy   Manual Therapy  Soft tissue mobilization;Passive ROM    Soft tissue mobilization  Roller to bil quad and ITB    Passive ROM  Manual stretching for hip flexors and quads               PT Short Term Goals - 12/13/18 1408      PT SHORT TERM GOAL #1    Title  Pt to demo ability for sit to stand x1 with no use of UEs.     Time  2    Period  Weeks    Status  New    Target Date  12/25/18      PT SHORT TERM GOAL #2   Title  Pt to be independent with initial HEp    Time  2    Period  Weeks    Status  New    Target Date  12/25/18        PT Long Term Goals - 12/13/18 1405      PT LONG TERM GOAL #1   Title  Pt to report decreased pain in LEs to 0-2/10 with initial standing and ambulation     Time  6    Period  Weeks    Status  New    Target Date  01/22/19      PT LONG TERM GOAL #2   Title  Pt to demo ability for independent final HEP for back and LE mobility and strength.     Time  6    Period  Weeks    Status  New    Target Date  01/22/19      PT LONG TERM GOAL #3   Title  Pt to demo improved ambulation pattern, to be wfl for pt age, for at least 500 ft, with LRAD, and balance WFL, to improve safety and endurance with community activity     Baseline  Pt with decreased balance, and inefficient use of SPC.     Time  6    Period  Weeks    Status  New    Target Date  01/22/19      PT LONG TERM GOAL #4   Title  Pt to demo ability for sit to stand without use of UEs, x5, on 1st try.     Time  6    Period  Weeks    Status  New    Target Date  01/22/19            Plan - 12/21/18 1537    Clinical Impression Statement  Pt with  improving use of SPC and RW. Pts pain in thighs is likely muscle soreness from increased activity and strengthening. Ther ex, manual stretching, and STM done today to improve this. Pt states decreased soreness when leaving appt today. Pt requests to be done on bike after 3 min, due to legs being tired. Pt educated on continued stretching and movement for LE pain. Plan to progress strength as tolerated.     Personal Factors and Comorbidities  Age    Examination-Activity Limitations  Stairs;Lift;Bend;Stand;Transfers    Examination-Participation Restrictions  Cleaning;Yard Work    Copy  Evolving/Moderate complexity    Rehab Potential  Good    PT Frequency  2x / week    PT Duration  6 weeks    PT Treatment/Interventions  ADLs/Self Care Home Management;Moist Heat;Traction;Ultrasound;Functional mobility training;Stair training;Gait training;DME Instruction;Therapeutic activities;Therapeutic exercise;Balance training;Neuromuscular re-education;Manual techniques;Patient/family education;Orthotic Fit/Training;Passive range of motion;Energy conservation;Taping;Dry needling;Spinal Manipulations;Joint Manipulations    Consulted and Agree with Plan of Care  Patient       Patient will benefit from skilled therapeutic intervention in order to improve the following deficits and impairments:  Abnormal gait, Pain, Improper body mechanics, Increased muscle spasms, Hypermobility, Decreased mobility, Decreased activity tolerance, Decreased endurance, Decreased range of motion, Decreased strength, Difficulty walking, Decreased balance  Visit Diagnosis: Bilateral leg pain  Acute bilateral low back pain without sciatica     Problem List Patient Active Problem List   Diagnosis Date Noted  . Acute right ankle pain 07/19/2018  . TSH elevation 02/25/2016  . Orthostatic hypotension 02/17/2016  . Syncope 01/07/2016  . 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 Bifascicular block 08/20/2015  . Neuropathy ([place]) 02/05/2015  . Diverticulosis of colon   . Vitamin D deficiency   . Hyperglycemia   . Dysphagia   . Diverticulum of esophagus, acquired   . Hyperlipidemia   . Glaucoma   . BPH (benign prostatic hyperplasia)   . Essential hypertension 02/01/2013  . 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    Lyndee Hensen, PT, DPT 3:39 PM  12/21/18    [facility] [place], [place], [address] Phone: [phone]   Fax:  [phone]  Name: Kirk [name] MRN: [id number] Date of Birth: 03-11-1941

## 2018-12-25 ENCOUNTER — Encounter: Payer: PPO | Admitting: Physical Therapy

## 2018-12-26 DIAGNOSIS — I10 Essential (primary) hypertension: Secondary | ICD-10-CM | POA: Diagnosis not present

## 2018-12-26 DIAGNOSIS — R55 Syncope and collapse: Secondary | ICD-10-CM | POA: Diagnosis not present

## 2018-12-26 DIAGNOSIS — R1111 Vomiting without nausea: Secondary | ICD-10-CM | POA: Diagnosis not present

## 2018-12-27 ENCOUNTER — Encounter: Payer: PPO | Admitting: Physical Therapy

## 2019-01-02 ENCOUNTER — Encounter: Payer: PPO | Admitting: Physical Therapy

## 2019-01-04 ENCOUNTER — Encounter: Payer: PPO | Admitting: Physical Therapy

## 2019-01-16 ENCOUNTER — Encounter: Payer: Self-pay | Admitting: Sports Medicine

## 2019-01-16 ENCOUNTER — Other Ambulatory Visit: Payer: Self-pay

## 2019-01-16 ENCOUNTER — Ambulatory Visit (INDEPENDENT_AMBULATORY_CARE_PROVIDER_SITE_OTHER): Payer: PPO

## 2019-01-16 ENCOUNTER — Ambulatory Visit (INDEPENDENT_AMBULATORY_CARE_PROVIDER_SITE_OTHER): Payer: PPO | Admitting: Sports Medicine

## 2019-01-16 VITALS — BP 122/90 | HR 89 | Temp 98.2°F | Ht 73.0 in | Wt 194.0 lb

## 2019-01-16 DIAGNOSIS — M4726 Other spondylosis with radiculopathy, lumbar region: Secondary | ICD-10-CM

## 2019-01-16 DIAGNOSIS — M545 Low back pain, unspecified: Secondary | ICD-10-CM

## 2019-01-16 DIAGNOSIS — M79604 Pain in right leg: Secondary | ICD-10-CM

## 2019-01-16 DIAGNOSIS — R1030 Lower abdominal pain, unspecified: Secondary | ICD-10-CM | POA: Diagnosis not present

## 2019-01-16 DIAGNOSIS — M479 Spondylosis, unspecified: Secondary | ICD-10-CM

## 2019-01-16 DIAGNOSIS — R202 Paresthesia of skin: Secondary | ICD-10-CM | POA: Diagnosis not present

## 2019-01-16 MED ORDER — GABAPENTIN 100 MG PO CAPS: ORAL_CAPSULE | ORAL | 1 refills | Status: DC

## 2019-01-16 NOTE — Progress Notes (Signed)
[name], [place] at [facility] [phone]  ROLIN SCHULT - 78 y.o. male MRN [id number]  Date of birth: 1941/08/10  Visit Date: January 16, 2019  PCP: [name], MD   Referred by: [name], MD  SUBJECTIVE:   Chief Complaint  Patient presents with  . Right Leg - Initial Assessment    R leg and groin pain s/p fall 01/14/2019. Takes Tylenol prn.     HPI: Patient is continuing to have bilateral right worse than left thigh and pelvic pain.  He reports having a fall over the weekend when he tripped going through the yard.  He has had several falls.  He did work with physical therapy after our last office visit and had some improvement with this but it was discontinued due to the COVID-19 crisis.  He has been performing some of his home therapeutic exercises but this is minimal.  He had only minimal improvement with the prednisone and no improvement with the diclofenac.  He is having pain throughout the day and has a significant 3-hour stretch each night where he has a hard time sleeping but he does not specifically relate this to the leg pain.  REVIEW OF SYSTEMS: Denies fevers, chills, recent weight gain or weight loss.  No night sweats.  Pt denies any change in bowel or bladder habits, muscle weakness, numbness or falls associated with this pain.  HISTORY:  Prior history reviewed and updated per electronic medical record.  Patient Active Problem List   Diagnosis Date Noted  . Acute right ankle pain 07/19/2018  . TSH elevation 02/25/2016  . Orthostatic hypotension 02/17/2016  . Syncope 01/07/2016  . 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 Bifascicular block 08/20/2015    LAFB and RBBB   . Neuropathy ([place]) 02/05/2015  . Diverticulosis of colon   . Vitamin D deficiency   . Hyperglycemia   . Dysphagia   . Diverticulum of esophagus, acquired     2006 Barium swallow showwed Zenker's  diverticulum,, HH and Schatzki ring. Saw Dr. Loren Racer, ENT in the past.   . Hyperlipidemia     Lab Results  Component Value Date   CHOL 168 07/06/2018   HDL 37.50 (L) 07/06/2018   LDLCALC 92 01/25/2018   LDLDIRECT 107.0 07/06/2018   TRIG 288.0 (H) 07/06/2018   CHOLHDL 4 07/06/2018    The 10-year ASCVD risk score Mikey Bussing DC Jr., et al., 2013) is: 34.7%   Values used to calculate the score:     Age: 85 years     Sex: Male     Is Non-Hispanic African American: No     Diabetic: No     Tobacco smoker: No     Systolic Blood Pressure: 494 mmHg     Is BP treated: Yes     HDL Cholesterol: 37.5 mg/dL     Total Cholesterol: 168 mg/dL    . Glaucoma   . BPH (benign prostatic hyperplasia)   . Essential hypertension 02/01/2013  . 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 Not on file  Tobacco Use  . Smoking status: Never Smoker  . Smokeless tobacco: Never Used  Substance and Sexual Activity  . Alcohol use: No  . Drug use: No  . Sexual activity: Not on file   Social History   Social History Narrative   Married   Lives at home with wife Pamala Hurry)  No regular exercise, but does his own yard work and household chores.   Never smoked   Alcohol none     OBJECTIVE:  VS:  HT:6\' 1"  (185.4 cm)   WT:194 lb (88 kg)  BMI:25.6    BP:122/90  HR:89bpm  TEMP:98.2 F (36.8 C)(Oral)  RESP:97 %   PHYSICAL EXAM: Adult male. No acute distress.  Alert and appropriate. Bilateral lower extremities are overall well aligned.  He has no significant knee effusion.  Overall good range of motion of his hips and knees with internal and external rotation and flexion/extension.  He has 4/5 strength with hip flexion and knee extension.  Hip abduction strength is 4/5 as well.  He has a negative straight leg raise bilaterally.  Mild pain with palpation of the bilateral paraspinal musculature and over the L2-3 facets most focally.  He has fairly significant  sarcopenia/atrophy of the hip and thigh musculature.   ASSESSMENT:   1. Acute bilateral low back pain without sciatica   2. Right leg pain   3. Inguinal pain, unspecified laterality   4. Spondylosis   5. Osteoarthritis of spine with radiculopathy, lumbar region     PROCEDURES:  None  PLAN:  Pertinent additional documentation may be included in corresponding procedure notes, imaging studies, problem based documentation and patient instructions.  No problem-specific Assessment & Plan notes found for this encounter.   I am concerned that he has underlying spinal stenosis.  Unfortunately were limited from the standpoint of obtaining MRI and therapeutic injections at this point.  Physical therapy is also limited due to restrictions.  We will have him continue with home therapeutic exercises previously prescribed as well as working on marching activities.  Gabapentin titration encouraged to help him remain functional and decrease pain levels.  At this time his hip and knees do not seem to be the primary etiology and this does seem more radicular especially given the atrophy appreciated..  Home Therapeutic Exercises: Continue previously prescribed home exercise program   Activity modifications and the importance of avoiding exacerbating activities (limiting pain to no more than a 4 / 10 during or following activity) recommended and discussed.   Discussed red flag symptoms that warrant earlier emergent evaluation and patient voices understanding.    Meds ordered this encounter  Medications  . gabapentin (NEURONTIN) 100 MG capsule    Sig: Start with 1 tab po qhs X 1 week, then increase to 1 tab po bid X 1 week then 1 tab po tid prn    Dispense:  90 capsule    Refill:  1   Lab Orders  No laboratory test(s) ordered today    Imaging Orders     DG Lumbar Spine Complete Referral Orders  No referral(s) requested today    Return in about 4 weeks (around 02/13/2019) for repeat clinical  exam.          Gerda Diss, [facility] Physician

## 2019-01-26 ENCOUNTER — Telehealth: Payer: Self-pay | Admitting: Family Medicine

## 2019-01-26 NOTE — Telephone Encounter (Signed)
See note

## 2019-01-26 NOTE — Telephone Encounter (Signed)
Returned pt's call and instructed pt that it's ok to begin taking 2 pills/day and that he should add the second dose at breakfast since he's currently taking his first dose one hour before bed.

## 2019-01-26 NOTE — Telephone Encounter (Signed)
Patient requesting a call back so that he can have clarification on when to take his second pill of Gabapentin. States right now it is not doing "really good" please advise patient

## 2019-01-26 NOTE — Telephone Encounter (Signed)
Breakfast and 1 hour before bed if taking 2tablets daily If 3 daily then add a dose at 2-3PM

## 2019-02-14 ENCOUNTER — Ambulatory Visit: Payer: PPO | Admitting: Sports Medicine

## 2019-02-14 ENCOUNTER — Ambulatory Visit: Payer: PPO | Admitting: Family Medicine

## 2019-02-14 ENCOUNTER — Other Ambulatory Visit: Payer: Self-pay

## 2019-02-14 ENCOUNTER — Encounter: Payer: Self-pay | Admitting: Family Medicine

## 2019-02-14 VITALS — BP 122/82 | HR 77 | Ht 73.0 in | Wt 193.0 lb

## 2019-02-14 DIAGNOSIS — M48061 Spinal stenosis, lumbar region without neurogenic claudication: Secondary | ICD-10-CM | POA: Diagnosis not present

## 2019-02-14 DIAGNOSIS — M5416 Radiculopathy, lumbar region: Secondary | ICD-10-CM | POA: Diagnosis not present

## 2019-02-14 NOTE — Progress Notes (Signed)
[name] [place], [place] [address] Phone: [phone] Subjective:   Kirk [name], am serving as a scribe for Kirk [name].  I'm seeing this patient by the request  of:    CC: Low back pain   UVO:[id number]  Kirk [name] is a 78 y.o. male coming in with complaint of back pain. Pain is in the groin and down posterior aspect of both legs. Pain occurring for 3 months. Sit to stand increases his pain. Walking decreases his pain. Was doing physical therapy. Did not have enough session to be able to tell if therapy was effective. Has been using gabapentin. Does have history of falls.  Patient has fallen twice.  Has had needing to call EMS.  Patient was doing a little bit better with physical therapy that stopped secondary to the coronavirus.  Patient walks with the aid of a walker.  Noticing increasing weakness of the legs.  Patient did have x-rays that were independently visualized by me.  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  Patient rates the severity of pain is 7 out of 10.  Concerned that the gabapentin may cause more of his eye problems with a given history of congenital glaucoma.  Patient tries to avoid anti-inflammatories as well.     Past Medical History:  Diagnosis Date  . Cataract   . Congenital glaucoma    "both eyes operated on when I was 19 months old"  . Cough   . Disturbance of skin sensation   . Diverticulosis of colon 2014  . Diverticulum of esophagus, acquired   . Dysphagia, unspecified(787.20)   . Encounter for long-term (current) use of other medications   . Herpes zoster without mention of complication   . Hyperglycemia   . Hyperlipidemia   . Hypertension   . 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 Lumbago   . Nevus, non-neoplastic   . Osteoarthrosis, unspecified whether generalized or localized,  unspecified site    pt. denies  . Other premature beats   . Pain in limb   . Plantar fascial fibromatosis   . Special screening for malignant neoplasm of prostate   . Unspecified glaucoma(365.9)   . Unspecified pruritic disorder   . Unspecified vitamin D deficiency   . Urinary frequency   . Zenker's diverticulum    Past Surgical History:  Procedure Laterality Date  . CATARACT EXTRACTION BILATERAL W/ ANTERIOR VITRECTOMY Bilateral 1977  . COLONOSCOPY  09/06/2013   Kirk [name]  . DIRECT LARYNGOSCOPY  10/22/2015   Cervical esophagoscopy. Endoscopic esophageal diverticulotomy.   Marland Kitchen [place]  . GLAUCOMA SURGERY  1945   congenital glaucoma  . HERNIA REPAIR  1993  . [place]  2008  . LARYNGOSCOPY     with stapling of zenkers diverticulum   Social History   Socioeconomic History  . Marital status: Married    Spouse name: Not on file  . Number of children: Not on file  . Years of education: Not on file  . Highest education level: Not on file  Occupational History  . Not on file  Social Needs  . Financial resource strain: Not on file  . Food insecurity:    Worry: Not on file    Inability: Not on file  . Transportation needs:    Medical: Not on file    Non-medical: Not on  file  Tobacco Use  . Smoking status: Never Smoker  . Smokeless tobacco: Never Used  Substance and Sexual Activity  . Alcohol use: [name]  . Drug use: [name]  . Sexual activity: Not on file  Lifestyle  . Physical activity:    Days per week: Not on file    Minutes per session: Not on file  . Stress: Not on file  Relationships  . Social connections:    Talks on phone: Not on file    Gets together: Not on file    Attends religious service: Not on file    Active member of club or organization: Not on file    Attends meetings of clubs or organizations: Not on file    Relationship status: Not on file  Other Topics Concern  . Not on file  Social History Narrative   Married   Lives  at home with wife Kirk [name])   [name] regular exercise, but does his own yard work and household chores.   Never smoked   Alcohol none   Allergies  Allergen Reactions  . Reserpine Other (See Comments)    Unknown reaction  . Sulfa Antibiotics Other (See Comments)    Patient denies - Unknown reaction   Family History  Problem Relation Age of Onset  . Hypertension Mother   . Dementia Mother   . Kidney disease Father   . CAD Father   . Colon cancer Neg Hx   . Throat cancer Neg Hx   . Diabetes Neg Hx   . Liver disease Neg Hx      Current Outpatient Medications (Cardiovascular):  .  lisinopril-hydrochlorothiazide (ZESTORETIC) 20-12.5 MG tablet, Take 0.5 tablets by mouth daily.     Current Outpatient Medications (Other):  .  brimonidine (ALPHAGAN) 0.2 % ophthalmic solution,  .  cholecalciferol (VITAMIN D) 400 units TABS tablet, Take 400 Units by mouth daily. Marland Kitchen  gabapentin (NEURONTIN) 100 MG capsule, Start with 1 tab po qhs X 1 week, then increase to 1 tab po bid X 1 week then 1 tab po tid prn .  timolol (TIMOPTIC) 0.5 % ophthalmic solution, Place 1 drop into the right eye daily.     Past medical history, social, surgical and family history all reviewed in electronic medical record.  [name]   Review of Systems:  [name] headache, visual changes, nausea, vomiting, diarrhea, constipation, dizziness, abdominal pain, skin rash, fevers, chills, night sweats, weight loss, swollen lymph nodes, body aches, joint swelling, chest pain, shortness of breath, mood changes.  Positive muscle aches  Objective  Blood pressure 122/82, pulse 77, height 6\' 1"  (1.854 m), weight 193 lb (87.5 kg), SpO2 99 %.    General: [name] apparent distress alert and oriented x3 mood and affect normal, dressed appropriately.  HEENT: Patient has significantly thick glasses on poor dental hygiene Respiratory: Patient's speak in full sentences and does not  appear short of breath  Cardiovascular: Trace lower extremity edema, non tender, [name] erythema  Skin: Warm dry intact with [name] signs of infection or rash on extremities or on axial skeleton.  Abdomen: Soft nontender  Neuro: Cranial nerves II through XII are intact, neurovascularly intact in all extremities with 2+ DTRs and 2+ pulses.  Lymph: [name] lymphadenopathy of posterior or anterior cervical chain or axillae bilaterally.  Gait slow with a shuffling gait with a walker.  Poor core strength MSK:  tender with limited range of motion and good stability and symmetric strength  and tone of shoulders, elbows, wrist, and ankles bilaterally.   Patient's back exam shows loss of lordosis significantly.  Tender to palpation.  Patient does have atrophy of the musculature of the lower extremities.  Patient may have some mild peripheral neuropathy of the feet noted.  Patient has 4-5 strength of the legs.  Trace pitting edema noted mid calf and below.  Deep tendon reflexes seem to be 1+ of the Achilles bilaterally    Impression and Recommendations:     This case required medical decision making of moderate complexity. The above documentation has been reviewed and is accurate and complete Lyndal Pulley, DO       Note: This dictation was prepared with Dragon dictation along with smaller phrase technology. Any transcriptional errors that result from this process are unintentional.

## 2019-02-14 NOTE — Assessment & Plan Note (Signed)
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  We discussed which activities to do in which wants to avoid.  We discussed posture and ergonomics and continuing physical therapy.  I do believe that patient needs an MRI at this point.  We need to consider the possibility of an epidural injection to avoid patient's other comorbidities.  Patient wants to avoid any surgical intervention and I think that is a good idea.  No other significant changes in medications at the moment.  Discussed only taking the gabapentin at night though.  Follow-up after MRI likely telephone visit.

## 2019-02-14 NOTE — Patient Instructions (Signed)
Good to see you  Ice is your friend Continue the gabapentin at night MRI of the back ordered today call [phone] to see if you can schedule.  Keep using the walker If Ander Purpura starts seeing people in therapy ok to start  I will call you with the results and we will discuss next steps Be safe

## 2019-02-16 ENCOUNTER — Telehealth: Payer: Self-pay | Admitting: Family Medicine

## 2019-02-16 NOTE — Telephone Encounter (Signed)
Copied from [place] [id number]. Topic: Quick Communication - See Telephone Encounter >> Feb 16, 2019  2:57 PM Nils Flack wrote: CRM for notification. See Telephone encounter for: 02/16/19. Dr Tamala Julian visit  Wife called - she says that on pt's avs it says to stop taking tylenol and wife is saying that wasn't discussed in the visit.  She wants to clarify as pt takes tylenol for headaches and such  Please call [phone]

## 2019-02-19 NOTE — Telephone Encounter (Signed)
Spoke with patient's wife and per a verbal from Dr. Tamala Julian ok to take Tylenol.

## 2019-02-21 ENCOUNTER — Other Ambulatory Visit: Payer: Self-pay

## 2019-02-21 ENCOUNTER — Ambulatory Visit (INDEPENDENT_AMBULATORY_CARE_PROVIDER_SITE_OTHER): Payer: PPO | Admitting: Physical Therapy

## 2019-02-21 ENCOUNTER — Encounter: Payer: Self-pay | Admitting: Physical Therapy

## 2019-02-21 DIAGNOSIS — M545 Low back pain, unspecified: Secondary | ICD-10-CM

## 2019-02-21 NOTE — Therapy (Signed)
[facility] [address] [place], [place], [address] Phone: [phone]   Fax:  [phone]  Physical Therapy Treatment/Recertification  Patient Details  Name: Kirk [name] MRN: [id number] Date of Birth: [birth year] Referring Provider (PT): Dr [name]   Encounter Date: 02/21/2019  PT End of Session - 02/21/19 1440    Visit Number  5    Number of Visits  16    Date for PT Re-Evaluation  04/04/19    Authorization Type  HTA    PT Start Time  1355    PT Stop Time  1438    PT Time Calculation (min)  43 min    Activity Tolerance  Patient tolerated treatment well    Behavior During Therapy  [facility] for tasks assessed/performed;Flat affect       Past Medical History:  Diagnosis Date  . Cataract   . 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 Cough   . Disturbance of skin sensation   . Diverticulosis of colon 2014  . Diverticulum of esophagus, acquired   . Dysphagia, unspecified(787.20)   . Encounter for long-term (current) use of other medications   . Herpes zoster without mention of complication   . Hyperglycemia   . Hyperlipidemia   . Hypertension   . 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 Lumbago   . Nevus, non-neoplastic   . Osteoarthrosis, unspecified whether generalized or localized, unspecified site    pt. denies  . Other premature beats   . Pain in limb   . Plantar fascial fibromatosis   . Special screening for malignant neoplasm of prostate   . Unspecified glaucoma(365.9)   . Unspecified pruritic disorder   . Unspecified vitamin D deficiency   . Urinary frequency   . Zenker's diverticulum     Past Surgical History:  Procedure Laterality Date  . CATARACT EXTRACTION BILATERAL W/ ANTERIOR VITRECTOMY Bilateral 1977  . COLONOSCOPY  09/06/2013   [name]  . DIRECT LARYNGOSCOPY  10/22/2015   Cervical esophagoscopy. Endoscopic  esophageal diverticulotomy.   [name]  . GLAUCOMA SURGERY  1945   congenital glaucoma  . HERNIA REPAIR  1993  . [place]  2008  . LARYNGOSCOPY     with stapling of zenkers diverticulum    There were no vitals filed for this visit.  Subjective Assessment - 02/21/19 1356    Subjective  returns to PT after approx 2 months due to COVID.  Feels he's doing "fair" and still having pain into legs.  pain is better with walking for a few minutes.  Saw Dr Tamala Julian and is scheduled for MRI next week 5/28 for injection planning.      Patient Stated Goals  decreased pain    Currently in Pain?  Yes    Pain Score  0-No pain    Pain Location  Leg    Pain Orientation  Right;Left    Pain Descriptors / Indicators  Aching    Pain Type  Acute pain    Pain Onset  More than a month ago    Aggravating Factors   initially standing, some exercise    Pain Relieving Factors  rest, walking a little bit         [facility] PT Assessment - 02/21/19 1443      Assessment   Medical Diagnosis  Bil LE Pain  Referring Provider (PT)  Dr [name]      Strength   Overall Strength Comments  Hips: 4-/5; Knee: 4/5       Ambulation/Gait   Gait Comments  35 ft x4 with RW;                    [facility] Adult PT Treatment/Exercise - 02/21/19 1400      Lumbar Exercises: Stretches   Active Hamstring Stretch  3 reps;30 seconds    Active Hamstring Stretch Limitations  seated    Single Knee to Chest Stretch  3 reps;30 seconds;Right;Left    Lower Trunk Rotation  5 reps;10 seconds    Other Lumbar Stretch Exercise  cues needed with each exercise and attention to time      Lumbar Exercises: Standing   Heel Raises  10 reps    Heel Raises Limitations  increased reliance on UEs; cues to decrease    Other Standing Lumbar Exercises  Hip Abd x10 bil;    Other Standing Lumbar Exercises  tandem stance 2x 15 sec with bil UE support      Lumbar Exercises: Seated   Sit to Stand  10 reps     Sit to Stand Limitations  minimal use of UEs.       Lumbar Exercises: Supine   Bent Knee Raise  10 reps   bil   Bridge  10 reps    Bridge Limitations  1-2 sec hold; unable to hold longer               PT Short Term Goals - 02/21/19 1441      PT SHORT TERM GOAL #1   Title  Pt to demo ability for sit to stand x1 with no use of UEs.     Time  2    Period  Weeks    Status  On-going    Target Date  03/07/19      PT SHORT TERM GOAL #2   Title  Pt to be independent with initial HEp    Time  2    Period  Weeks    Status  On-going    Target Date  03/07/19        PT Long Term Goals - 02/21/19 1441      PT LONG TERM GOAL #1   Title  Pt to report decreased pain in LEs to 0-2/10 with initial standing and ambulation     Time  6    Period  Weeks    Status  On-going    Target Date  04/04/19      PT LONG TERM GOAL #2   Title  Pt to demo ability for independent final HEP for back and LE mobility and strength.     Time  6    Period  Weeks    Status  On-going    Target Date  04/04/19      PT LONG TERM GOAL #3   Title  Pt to demo improved ambulation pattern, to be wfl for pt age, for at least 500 ft, with LRAD, and balance WFL, to improve safety and endurance with community activity     Baseline  Pt with decreased balance, and inefficient use of SPC.     Time  6    Period  Weeks    Status  On-going    Target Date  04/04/19      PT LONG TERM GOAL #4   Title  Pt to demo ability for sit to stand without use of UEs, x5, on 1st try.     Time  6    Period  Weeks    Status  On-going    Target Date  04/04/19            Plan - 02/21/19 1442    Clinical Impression Statement  Pt returns to PT after 2 months due to COVID-19.  Goals assessed today and all ongoing at this time due to limited change from initial eval.  Pt overall doing well and reported compliance with HEP, and needed lots of cues to properly complete.  Recommended continued PT 2x/wk x 6 weeks.     Personal Factors and Comorbidities  Age    Examination-Activity Limitations  Stairs;Lift;Bend;Stand;Transfers    Examination-Participation Restrictions  Cleaning;Yard Work    [employer]  Evolving/Moderate complexity    Rehab Potential  Good    PT Frequency  2x / week    PT Duration  6 weeks    PT Treatment/Interventions  ADLs/Self Care Home Management;Moist Heat;Traction;Ultrasound;Functional mobility training;Stair training;Gait training;DME Instruction;Therapeutic activities;Therapeutic exercise;Balance training;Neuromuscular re-education;Manual techniques;Patient/family education;Orthotic Fit/Training;Passive range of motion;Energy conservation;Taping;Dry needling;Spinal Manipulations;Joint Manipulations    PT Next Visit Plan  continue strengthening, functional mobility    PT Home Exercise Plan  [id number]    Consulted and Agree with Plan of Care  Patient       Patient will benefit from skilled therapeutic intervention in order to improve the following deficits and impairments:  Abnormal gait, Pain, Improper body mechanics, Increased muscle spasms, Hypermobility, Decreased mobility, Decreased activity tolerance, Decreased endurance, Decreased range of motion, Decreased strength, Difficulty walking, Decreased balance  Visit Diagnosis: Acute bilateral low back pain without sciatica - Plan: PT plan of care cert/re-cert     Problem List Patient Active Problem List   Diagnosis Date Noted  . 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 Acute right ankle pain 07/19/2018  . TSH elevation 02/25/2016  . Orthostatic hypotension 02/17/2016  . Syncope 01/07/2016  . 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 Bifascicular block 08/20/2015  . Neuropathy ([place]) 02/05/2015  . Diverticulosis of colon   . Vitamin D deficiency   . Hyperglycemia   . Dysphagia   . Diverticulum of esophagus, acquired   . Hyperlipidemia   . Glaucoma   . BPH  (benign prostatic hyperplasia)   . Essential hypertension 02/01/2013  . 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

## 2019-02-28 ENCOUNTER — Ambulatory Visit (INDEPENDENT_AMBULATORY_CARE_PROVIDER_SITE_OTHER): Payer: PPO | Admitting: Physical Therapy

## 2019-02-28 ENCOUNTER — Encounter: Payer: Self-pay | Admitting: Physical Therapy

## 2019-02-28 ENCOUNTER — Other Ambulatory Visit: Payer: Self-pay

## 2019-02-28 DIAGNOSIS — M545 Low back pain, unspecified: Secondary | ICD-10-CM

## 2019-02-28 NOTE — Therapy (Signed)
[facility] [address] [place], [place], [address] Phone: [phone]   Fax:  [phone]  Physical Therapy Treatment  Patient Details  Name: Kirk [name] MRN: [id number] Date of Birth: 11-Mar-1941 Referring Provider (PT): Dr [name]   Encounter Date: 02/28/2019  PT End of Session - 02/28/19 1451    Visit Number  6    Number of Visits  16    Date for PT Re-Evaluation  04/04/19    Authorization Type  HTA    PT Start Time  1355    PT Stop Time  1443    PT Time Calculation (min)  48 min    Activity Tolerance  Patient tolerated treatment well    Behavior During Therapy  [facility] for tasks assessed/performed;Flat affect       Past Medical History:  Diagnosis Date  . Cataract   . Congenital glaucoma    "both eyes operated on when I was 60 months old"  . Cough   . Disturbance of skin sensation   . Diverticulosis of colon 2014  . Diverticulum of esophagus, acquired   . Dysphagia, unspecified(787.20)   . Encounter for long-term (current) use of other medications   . Herpes zoster without mention of complication   . Hyperglycemia   . Hyperlipidemia   . Hypertension   . 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 Lumbago   . Nevus, non-neoplastic   . Osteoarthrosis, unspecified whether generalized or localized, unspecified site    pt. denies  . Other premature beats   . Pain in limb   . Plantar fascial fibromatosis   . Special screening for malignant neoplasm of prostate   . Unspecified glaucoma(365.9)   . Unspecified pruritic disorder   . Unspecified vitamin D deficiency   . Urinary frequency   . Zenker's diverticulum     Past Surgical History:  Procedure Laterality Date  . CATARACT EXTRACTION BILATERAL W/ ANTERIOR VITRECTOMY Bilateral 1977  . COLONOSCOPY  09/06/2013   [name]  . DIRECT LARYNGOSCOPY  10/22/2015   Cervical esophagoscopy. Endoscopic esophageal  diverticulotomy.   Marland Kitchen [place]  . GLAUCOMA SURGERY  1945   congenital glaucoma  . HERNIA REPAIR  1993  . [place]  2008  . LARYNGOSCOPY     with stapling of zenkers diverticulum    There were no vitals filed for this visit.  Subjective Assessment - 02/28/19 1356    Subjective  doing well.  wants to wait to schedule any more PT appts until after MRI (scheduled tomorrow).  MRI is tomorrow for [facility] planning.      Patient Stated Goals  decreased pain    Pain Score  0-No pain    Pain Onset  More than a month ago                       [facility] Adult PT Treatment/Exercise - 02/28/19 1431      Lumbar Exercises: Stretches   Passive Hamstring Stretch  Right;Left;3 reps;30 seconds    Passive Hamstring Stretch Limitations  seated    Quad Stretch  Right;Left;2 reps;30 seconds    Quad Stretch Limitations  seated          Balance Exercises - 02/28/19 1358      OTAGO PROGRAM   Head Movements  Sitting;5 reps    Neck Movements  Sitting;5 reps    Back Extension  Standing;5 reps    Trunk Movements  Standing;5 reps    Ankle Movements  Sitting;10 reps    Knee Extensor  10 reps;Weight (comment)   2#   Knee Flexor  10 reps;Weight (comment)   2#   Hip ABductor  10 reps;Weight (comment)   2#   Ankle Plantorflexors  20 reps, support    Ankle Dorsiflexors  20 reps, support    Knee Bends  10 reps, support    Tandem Stance  10 seconds, support    Tandem Walk  Support    One Leg Stand  10 seconds, support          PT Short Term Goals - 02/21/19 1441      PT SHORT TERM GOAL #1   Title  Pt to demo ability for sit to stand x1 with no use of UEs.     Time  2    Period  Weeks    Status  On-going    Target Date  03/07/19      PT SHORT TERM GOAL #2   Title  Pt to be independent with initial HEp    Time  2    Period  Weeks    Status  On-going    Target Date  03/07/19        PT Long Term Goals - 02/21/19 1441      PT LONG TERM GOAL  #1   Title  Pt to report decreased pain in LEs to 0-2/10 with initial standing and ambulation     Time  6    Period  Weeks    Status  On-going    Target Date  04/04/19      PT LONG TERM GOAL #2   Title  Pt to demo ability for independent final HEP for back and LE mobility and strength.     Time  6    Period  Weeks    Status  On-going    Target Date  04/04/19      PT LONG TERM GOAL #3   Title  Pt to demo improved ambulation pattern, to be wfl for pt age, for at least 500 ft, with LRAD, and balance WFL, to improve safety and endurance with community activity     Baseline  Pt with decreased balance, and inefficient use of SPC.     Time  6    Period  Weeks    Status  On-going    Target Date  04/04/19      PT LONG TERM GOAL #4   Title  Pt to demo ability for sit to stand without use of UEs, x5, on 1st try.     Time  6    Period  Weeks    Status  On-going    Target Date  04/04/19            Plan - 02/28/19 1451    Clinical Impression Statement  Pt tolerated session well today needing occasional seated rest breaks, and reported legs "feel better now than when I got here" after session.  Progressing well with PT.  Scheduled for MRI tomorrow and anticipates return to PT, but wants to hold on scheduling for now.  Will plan to follow up next week.    Personal Factors and Comorbidities  Age    Examination-Activity Limitations  Stairs;Lift;Bend;Stand;Transfers    Examination-Participation Restrictions  Cleaning;Yard Work    Database administrator  Good    PT Frequency  2x / week    PT Duration  6 weeks    PT Treatment/Interventions  ADLs/Self Care Home Management;Moist Heat;Traction;Ultrasound;Functional mobility training;Stair training;Gait training;DME Instruction;Therapeutic activities;Therapeutic exercise;Balance training;Neuromuscular re-education;Manual techniques;Patient/family education;Orthotic Fit/Training;Passive  range of motion;Energy conservation;Taping;Dry needling;Spinal Manipulations;Joint Manipulations    PT Next Visit Plan  continue strengthening, functional mobility    PT Home Exercise Plan  [email or web address]    Consulted and Agree with Plan of Care  Patient       Patient will benefit from skilled therapeutic intervention in order to improve the following deficits and impairments:  Abnormal gait, Pain, Improper body mechanics, Increased muscle spasms, Hypermobility, Decreased mobility, Decreased activity tolerance, Decreased endurance, Decreased range of motion, Decreased strength, Difficulty walking, Decreased balance  Visit Diagnosis: Acute bilateral low back pain without sciatica     Problem List Patient Active Problem List   Diagnosis Date Noted  . 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 Acute right ankle pain 07/19/2018  . TSH elevation 02/25/2016  . Orthostatic hypotension 02/17/2016  . Syncope 01/07/2016  . 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 Bifascicular block 08/20/2015  . Neuropathy ([place]) 02/05/2015  . Diverticulosis of colon   . Vitamin D deficiency   . Hyperglycemia   . Dysphagia   . Diverticulum of esophagus, acquired   . Hyperlipidemia   . Glaucoma   . BPH (benign prostatic hyperplasia)   . Essential hypertension 02/01/2013  . 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      Laureen Abrahams, PT, DPT 02/28/19 2:53 PM     [place], [place], [address] Phone: [phone]   Fax:  [phone]  Name: Kirk [name] MRN: [id number] Date of Birth: [birth year]

## 2019-03-01 ENCOUNTER — Ambulatory Visit
Admission: RE | Admit: 2019-03-01 | Discharge: 2019-03-01 | Disposition: A | Discharge status: Home / Self Care | Payer: PPO | Source: Ambulatory Visit | Attending: Family Medicine | Admitting: Family Medicine

## 2019-03-01 DIAGNOSIS — M5416 Radiculopathy, lumbar region: Secondary | ICD-10-CM

## 2019-03-01 DIAGNOSIS — M48061 Spinal stenosis, lumbar region without neurogenic claudication: Secondary | ICD-10-CM | POA: Diagnosis not present

## 2019-03-05 ENCOUNTER — Other Ambulatory Visit: Payer: Self-pay

## 2019-03-05 ENCOUNTER — Telehealth: Payer: Self-pay

## 2019-03-05 DIAGNOSIS — M545 Low back pain, unspecified: Secondary | ICD-10-CM

## 2019-03-05 DIAGNOSIS — G8929 Other chronic pain: Secondary | ICD-10-CM

## 2019-03-05 NOTE — Telephone Encounter (Signed)
Called patient and talked to his wife. Epidural has been ordered.

## 2019-03-05 NOTE — Telephone Encounter (Signed)
-----   Message from Lyndal Pulley, DO sent at 03/02/2019  3:25 PM EDT ----- Moderate spinal stenosis .  I would recommend an epidurla if he would like. If he agrees then order L2-3

## 2019-03-07 ENCOUNTER — Encounter: Payer: Self-pay | Admitting: Physical Therapy

## 2019-03-07 ENCOUNTER — Ambulatory Visit (INDEPENDENT_AMBULATORY_CARE_PROVIDER_SITE_OTHER): Payer: PPO | Admitting: Physical Therapy

## 2019-03-07 DIAGNOSIS — M545 Low back pain, unspecified: Secondary | ICD-10-CM

## 2019-03-07 NOTE — Therapy (Signed)
[facility] [address] [place], [place], [address] Phone: [phone]   Fax:  [phone]  Physical Therapy Treatment  Patient Details  Name: Kirk [name] MRN: [id number] Date of Birth: 1941-07-03 Referring Provider (PT): Dr [name]   Encounter Date: 03/07/2019  PT End of Session - 03/07/19 1207    Visit Number  7    Number of Visits  16    Date for PT Re-Evaluation  04/04/19    Authorization Type  HTA    PT Start Time  1200    PT Stop Time  1243    PT Time Calculation (min)  43 min    Activity Tolerance  Patient tolerated treatment well    Behavior During Therapy  [facility] for tasks assessed/performed;Flat affect       Past Medical History:  Diagnosis Date  . Cataract   . Congenital glaucoma    "both eyes operated on when I was 49 months old"  . Cough   . Disturbance of skin sensation   . Diverticulosis of colon 2014  . Diverticulum of esophagus, acquired   . Dysphagia, unspecified(787.20)   . Encounter for long-term (current) use of other medications   . Herpes zoster without mention of complication   . Hyperglycemia   . Hyperlipidemia   . Hypertension   . 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 Lumbago   . Nevus, non-neoplastic   . Osteoarthrosis, unspecified whether generalized or localized, unspecified site    pt. denies  . Other premature beats   . Pain in limb   . Plantar fascial fibromatosis   . Special screening for malignant neoplasm of prostate   . Unspecified glaucoma(365.9)   . Unspecified pruritic disorder   . Unspecified vitamin D deficiency   . Urinary frequency   . Zenker's diverticulum     Past Surgical History:  Procedure Laterality Date  . CATARACT EXTRACTION BILATERAL W/ ANTERIOR VITRECTOMY Bilateral 1977  . COLONOSCOPY  09/06/2013   [name]  . DIRECT LARYNGOSCOPY  10/22/2015   Cervical esophagoscopy. Endoscopic esophageal  diverticulotomy.   Marland Kitchen [place]  . GLAUCOMA SURGERY  1945   congenital glaucoma  . HERNIA REPAIR  1993  . [place]  2008  . LARYNGOSCOPY     with stapling of zenkers diverticulum    There were no vitals filed for this visit.  Subjective Assessment - 03/07/19 1206    Currently in Pain?  Yes    Pain Score  2     Pain Location  Leg    Pain Orientation  Right;Left    Pain Descriptors / Indicators  Aching    Pain Type  Acute pain    Pain Onset  More than a month ago    Pain Frequency  Intermittent    Aggravating Factors   Initially standing, and prolonged walking/standing    Pain Relieving Factors  rest                       [facility] Adult PT Treatment/Exercise - 03/07/19 1205      Ambulation/Gait   Gait Comments  35 ft 8 with RW.       Lumbar Exercises: Stretches   Passive Hamstring Stretch  Right;Left;3 reps;30 seconds    Passive Hamstring Stretch Limitations  seated    Single Knee to Chest Stretch  3 reps;30 seconds;Right;Left    Quad  Stretch  --    Sports administrator Limitations  --      Lumbar Exercises: Standing   Heel Raises  15 reps    Heel Raises Limitations  1 UE support    Other Standing Lumbar Exercises  Standing March x20; HIp abd 2x10 bil;     Other Standing Lumbar Exercises  staggered stance weight shifts x15 bil; L/R weight shifts, Tandem stance 30 sec x1 bil;       Lumbar Exercises: Seated   Long Arc Quad on Chair  15 reps;Both    Sit to Stand  10 reps    Sit to Stand Limitations  minimal use of UEs.              PT Education - 03/07/19 1207    Education Details  Reviewed HEP     Person(s) Educated  Patient    Methods  Explanation    Comprehension  Verbalized understanding       PT Short Term Goals - 02/21/19 1441      PT SHORT TERM GOAL #1   Title  Pt to demo ability for sit to stand x1 with no use of UEs.     Time  2    Period  Weeks    Status  On-going    Target Date  03/07/19      PT  SHORT TERM GOAL #2   Title  Pt to be independent with initial HEp    Time  2    Period  Weeks    Status  On-going    Target Date  03/07/19        PT Long Term Goals - 02/21/19 1441      PT LONG TERM GOAL #1   Title  Pt to report decreased pain in LEs to 0-2/10 with initial standing and ambulation     Time  6    Period  Weeks    Status  On-going    Target Date  04/04/19      PT LONG TERM GOAL #2   Title  Pt to demo ability for independent final HEP for back and LE mobility and strength.     Time  6    Period  Weeks    Status  On-going    Target Date  04/04/19      PT LONG TERM GOAL #3   Title  Pt to demo improved ambulation pattern, to be wfl for pt age, for at least 500 ft, with LRAD, and balance WFL, to improve safety and endurance with community activity     Baseline  Pt with decreased balance, and inefficient use of SPC.     Time  6    Period  Weeks    Status  On-going    Target Date  04/04/19      PT LONG TERM GOAL #4   Title  Pt to demo ability for sit to stand without use of UEs, x5, on 1st try.     Time  6    Period  Weeks    Status  On-going    Target Date  04/04/19            Plan - 03/07/19 1302    Clinical Impression Statement  Pt requires mod-max cuing to decrease UE support with standing ther ex, balance, and with use of RW. Pt with improved distance and safety with turns with gait today. Reviewed HEP and importance of doing this, pt states  he feels better after sessions.     Personal Factors and Comorbidities  Age    Examination-Activity Limitations  Stairs;Lift;Bend;Stand;Transfers    Examination-Participation Restrictions  Cleaning;Yard Work    Merchant navy officer  Evolving/Moderate complexity    Rehab Potential  Good    PT Frequency  2x / week    PT Duration  6 weeks    PT Treatment/Interventions  ADLs/Self Care Home Management;Moist Heat;Traction;Ultrasound;Functional mobility training;Stair training;Gait training;DME  Instruction;Therapeutic activities;Therapeutic exercise;Balance training;Neuromuscular re-education;Manual techniques;Patient/family education;Orthotic Fit/Training;Passive range of motion;Energy conservation;Taping;Dry needling;Spinal Manipulations;Joint Manipulations    PT Next Visit Plan  continue strengthening, functional mobility    PT Home Exercise Plan  [email or web address]    Consulted and Agree with Plan of Care  Patient       Patient will benefit from skilled therapeutic intervention in order to improve the following deficits and impairments:  Abnormal gait, Pain, Improper body mechanics, Increased muscle spasms, Hypermobility, Decreased mobility, Decreased activity tolerance, Decreased endurance, Decreased range of motion, Decreased strength, Difficulty walking, Decreased balance  Visit Diagnosis: Acute bilateral low back pain without sciatica     Problem List Patient Active Problem List   Diagnosis Date Noted  . 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 Acute right ankle pain 07/19/2018  . TSH elevation 02/25/2016  . Orthostatic hypotension 02/17/2016  . Syncope 01/07/2016  . 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 Bifascicular block 08/20/2015  . Neuropathy ([place]) 02/05/2015  . Diverticulosis of colon   . Vitamin D deficiency   . Hyperglycemia   . Dysphagia   . Diverticulum of esophagus, acquired   . Hyperlipidemia   . Glaucoma   . BPH (benign prostatic hyperplasia)   . Essential hypertension 02/01/2013  . 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    Lyndee Hensen, PT, DPT 1:03 PM  03/07/19    [facility] [place], [place], [address] Phone: [phone]   Fax:  [phone]  Name: Kirk [name] MRN: [id number] Date of Birth: September 21, 1941

## 2019-03-13 ENCOUNTER — Other Ambulatory Visit: Payer: Self-pay

## 2019-03-13 ENCOUNTER — Encounter: Payer: Self-pay | Admitting: Physical Therapy

## 2019-03-13 ENCOUNTER — Telehealth: Payer: Self-pay

## 2019-03-13 ENCOUNTER — Ambulatory Visit (INDEPENDENT_AMBULATORY_CARE_PROVIDER_SITE_OTHER): Payer: PPO | Admitting: Physical Therapy

## 2019-03-13 DIAGNOSIS — M545 Low back pain, unspecified: Secondary | ICD-10-CM

## 2019-03-13 NOTE — Telephone Encounter (Signed)
Patient has been scheduled 6/25 @ 3pm

## 2019-03-13 NOTE — Therapy (Signed)
[facility] [address] [place], [place], [address] Phone: [phone]   Fax:  [phone]  Physical Therapy Treatment  Patient Details  Name: Kirk [name] MRN: [id number] Date of Birth: [birth year] Referring Provider (PT): Dr [name]   Encounter Date: 03/13/2019  PT End of Session - 03/13/19 1602    Visit Number  8    Number of Visits  16    Date for PT Re-Evaluation  04/04/19    Authorization Type  HTA    PT Start Time  1457    PT Stop Time  1547    PT Time Calculation (min)  50 min    Activity Tolerance  Patient tolerated treatment well    Behavior During Therapy  [facility] for tasks assessed/performed;Flat affect       Past Medical History:  Diagnosis Date  . Cataract   . Congenital glaucoma    "both eyes operated on when I was 33 months old"  . Cough   . Disturbance of skin sensation   . Diverticulosis of colon 2014  . Diverticulum of esophagus, acquired   . Dysphagia, unspecified(787.20)   . Encounter for long-term (current) use of other medications   . Herpes zoster without mention of complication   . Hyperglycemia   . Hyperlipidemia   . Hypertension   . 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 Lumbago   . Nevus, non-neoplastic   . Osteoarthrosis, unspecified whether generalized or localized, unspecified site    pt. denies  . Other premature beats   . Pain in limb   . Plantar fascial fibromatosis   . Special screening for malignant neoplasm of prostate   . Unspecified glaucoma(365.9)   . Unspecified pruritic disorder   . Unspecified vitamin D deficiency   . Urinary frequency   . Zenker's diverticulum     Past Surgical History:  Procedure Laterality Date  . CATARACT EXTRACTION BILATERAL W/ ANTERIOR VITRECTOMY Bilateral 1977  . COLONOSCOPY  09/06/2013   [name]  . DIRECT LARYNGOSCOPY  10/22/2015   Cervical esophagoscopy. Endoscopic esophageal  diverticulotomy.   Marland Kitchen [place]  . GLAUCOMA SURGERY  1945   congenital glaucoma  . HERNIA REPAIR  1993  . [place]  2008  . LARYNGOSCOPY     with stapling of zenkers diverticulum    There were no vitals filed for this visit.  Subjective Assessment - 03/13/19 1458    Subjective  doing well.  still having some pain in legs.  plans for [facility] friday 03/16/19.  follow up with Dr. Tamala Julian 6/25.    Patient Stated Goals  decreased pain    Currently in Pain?  Yes    Pain Score  6     Pain Location  Leg    Pain Orientation  Right;Left    Pain Descriptors / Indicators  Aching    Pain Type  Acute pain    Pain Onset  More than a month ago    Aggravating Factors   initially standing, and prolonged walking/standing    Pain Relieving Factors  rest                       [facility] Adult PT Treatment/Exercise - 03/13/19 1501      Lumbar Exercises: Stretches   Passive Hamstring Stretch  Right;Left;3 reps;30 seconds    Passive Hamstring Stretch Limitations  seated    Single  Knee to Chest Stretch  3 reps;30 seconds;Right;Left    Piriformis Stretch  Right;Left;2 reps;30 seconds    Piriformis Stretch Limitations  supine, knee to opposite shoulder in figure-4 position      Lumbar Exercises: Standing   Heel Raises  15 reps    Heel Raises Limitations  1 UE support    Row  Both;15 reps;Theraband    Theraband Level (Row)  Level 2 (Red)    Shoulder Extension  Both;15 reps;Theraband    Theraband Level (Shoulder Extension)  Level 2 (Red)    Other Standing Lumbar Exercises  SLS activities with 1 UE support: taps to 6" steps x 10 bil    Other Standing Lumbar Exercises  staggered stance weight shifts x15 bil; L/R weight shifts, Tandem stance 30 sec x1 bil;       Lumbar Exercises: Seated   Long Arc Quad on Chair  Both;10 reps    Sit to Stand  10 reps    Sit to Stand Limitations  elevated mat table; min A without UEs.  posterior lean present with cues to correct       Lumbar Exercises: Supine   Clam  10 reps    Clam Limitations  single limb; red theraband    Bridge  10 reps      Knee/Hip Exercises: Standing   Forward Step Up  Both;10 reps;Step Height: 6";Hand Hold: 2               PT Short Term Goals - 03/13/19 1602      PT SHORT TERM GOAL #1   Title  Pt to demo ability for sit to stand x1 with no use of UEs.     Time  2    Period  Weeks    Status  Achieved    Target Date  03/07/19      PT SHORT TERM GOAL #2   Title  Pt to be independent with initial HEp    Time  2    Period  Weeks    Status  Partially Met    Target Date  03/07/19        PT Long Term Goals - 02/21/19 1441      PT LONG TERM GOAL #1   Title  Pt to report decreased pain in LEs to 0-2/10 with initial standing and ambulation     Time  6    Period  Weeks    Status  On-going    Target Date  04/04/19      PT LONG TERM GOAL #2   Title  Pt to demo ability for independent final HEP for back and LE mobility and strength.     Time  6    Period  Weeks    Status  On-going    Target Date  04/04/19      PT LONG TERM GOAL #3   Title  Pt to demo improved ambulation pattern, to be wfl for pt age, for at least 500 ft, with LRAD, and balance WFL, to improve safety and endurance with community activity     Baseline  Pt with decreased balance, and inefficient use of SPC.     Time  6    Period  Weeks    Status  On-going    Target Date  04/04/19      PT LONG TERM GOAL #4   Title  Pt to demo ability for sit to stand without use of UEs, x5, on 1st try.  Time  6    Period  Weeks    Status  On-going    Target Date  04/04/19            Plan - 03/13/19 1603    Clinical Impression Statement  Pt has met 1 STG and partially met STG #2, as he still needs cues for HEP.  Scheduled for injection Friday, and wants to wait to schedule additional sessions.  Feel pt will continue to benefit from PT to maximize function.    Personal Factors and Comorbidities  Age     Examination-Activity Limitations  Stairs;Lift;Bend;Stand;Transfers    Examination-Participation Restrictions  Cleaning;Yard Work    Merchant navy officer  Evolving/Moderate complexity    Rehab Potential  Good    PT Frequency  2x / week    PT Duration  6 weeks    PT Treatment/Interventions  ADLs/Self Care Home Management;Moist Heat;Traction;Ultrasound;Functional mobility training;Stair training;Gait training;DME Instruction;Therapeutic activities;Therapeutic exercise;Balance training;Neuromuscular re-education;Manual techniques;Patient/family education;Orthotic Fit/Training;Passive range of motion;Energy conservation;Taping;Dry needling;Spinal Manipulations;Joint Manipulations    PT Next Visit Plan  continue strengthening, functional mobility    PT Home Exercise Plan  [email or web address]    Consulted and Agree with Plan of Care  Patient       Patient will benefit from skilled therapeutic intervention in order to improve the following deficits and impairments:  Abnormal gait, Pain, Improper body mechanics, Increased muscle spasms, Hypermobility, Decreased mobility, Decreased activity tolerance, Decreased endurance, Decreased range of motion, Decreased strength, Difficulty walking, Decreased balance  Visit Diagnosis: Acute bilateral low back pain without sciatica     Problem List Patient Active Problem List   Diagnosis Date Noted  . 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 Acute right ankle pain 07/19/2018  . TSH elevation 02/25/2016  . Orthostatic hypotension 02/17/2016  . Syncope 01/07/2016  . 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 Bifascicular block 08/20/2015  . Neuropathy ([place]) 02/05/2015  . Diverticulosis of colon   . Vitamin D deficiency   . Hyperglycemia   . Dysphagia   . Diverticulum of esophagus, acquired   . Hyperlipidemia   . Glaucoma   . BPH (benign prostatic hyperplasia)   . Essential hypertension 02/01/2013  . Impotence  sexual 01/30/2013      Laureen Abrahams, PT, DPT 03/13/19 4:04 PM     [place], [place], [address] Phone: [phone]   Fax:  [phone]  Name: ABELARDO SEIDNER MRN: [id number] Date of Birth: Aug 29, 1941

## 2019-03-13 NOTE — Telephone Encounter (Signed)
-----   Message from Kirk [name], [place] sent at 03/13/2019  1:27 PM EDT ----- Regarding: appointment He's having an epidural this Friday so he just needs to schedule a f/u 2 weeks out & let him know about the new check-in process please :)

## 2019-03-16 ENCOUNTER — Ambulatory Visit
Admission: RE | Admit: 2019-03-16 | Discharge: 2019-03-16 | Disposition: A | Discharge status: Home / Self Care | Payer: PPO | Source: Ambulatory Visit | Attending: Family Medicine | Admitting: Family Medicine

## 2019-03-16 DIAGNOSIS — M5126 Other intervertebral disc displacement, lumbar region: Secondary | ICD-10-CM | POA: Diagnosis not present

## 2019-03-16 DIAGNOSIS — G8929 Other chronic pain: Secondary | ICD-10-CM

## 2019-03-16 DIAGNOSIS — M545 Low back pain, unspecified: Secondary | ICD-10-CM

## 2019-03-16 MED ORDER — METHYLPREDNISOLONE ACETATE 40 MG/ML INJ SUSP (RADIOLOG
120.0000 mg | Freq: Once | INTRAMUSCULAR | Status: AC
  Administered 2019-03-16: 120 mg via EPIDURAL

## 2019-03-16 MED ORDER — IOPAMIDOL (ISOVUE-M 200) INJECTION 41%
1.0000 mL | Freq: Once | INTRAMUSCULAR | Status: AC
  Administered 2019-03-16: 1 mL via EPIDURAL

## 2019-03-16 NOTE — Discharge Instructions (Signed)

## 2019-03-20 ENCOUNTER — Other Ambulatory Visit: Payer: PPO

## 2019-03-23 ENCOUNTER — Other Ambulatory Visit: Payer: Self-pay | Admitting: Family Medicine

## 2019-03-29 ENCOUNTER — Ambulatory Visit (INDEPENDENT_AMBULATORY_CARE_PROVIDER_SITE_OTHER): Payer: PPO | Admitting: Family Medicine

## 2019-03-29 ENCOUNTER — Other Ambulatory Visit: Payer: Self-pay

## 2019-03-29 ENCOUNTER — Other Ambulatory Visit (INDEPENDENT_AMBULATORY_CARE_PROVIDER_SITE_OTHER): Payer: PPO

## 2019-03-29 ENCOUNTER — Encounter: Payer: Self-pay | Admitting: Family Medicine

## 2019-03-29 VITALS — BP 110/80 | HR 84 | Ht 73.0 in

## 2019-03-29 DIAGNOSIS — E559 Vitamin D deficiency, unspecified: Secondary | ICD-10-CM

## 2019-03-29 DIAGNOSIS — G629 Polyneuropathy, unspecified: Secondary | ICD-10-CM | POA: Diagnosis not present

## 2019-03-29 DIAGNOSIS — M2550 Pain in unspecified joint: Secondary | ICD-10-CM

## 2019-03-29 DIAGNOSIS — M48061 Spinal stenosis, lumbar region without neurogenic claudication: Secondary | ICD-10-CM | POA: Diagnosis not present

## 2019-03-29 DIAGNOSIS — M79604 Pain in right leg: Secondary | ICD-10-CM

## 2019-03-29 DIAGNOSIS — M79605 Pain in left leg: Secondary | ICD-10-CM

## 2019-03-29 LAB — COMPREHENSIVE METABOLIC PANEL
ALT: 35 U/L (ref 0–53)
AST: 17 U/L (ref 0–37)
Albumin: 4.3 g/dL (ref 3.5–5.2)
Alkaline Phosphatase: 65 U/L (ref 39–117)
BUN: 15 mg/dL (ref 6–23)
CO2: 25 mEq/L (ref 19–32)
Calcium: 9.7 mg/dL (ref 8.4–10.5)
Chloride: 101 mEq/L (ref 96–112)
Creatinine, Ser: 0.76 mg/dL (ref 0.40–1.50)
GFR: 99.38 mL/min (ref >60.00)
Glucose, Bld: 124 mg/dL — ABNORMAL HIGH (ref 70–99)
Potassium: 4 mEq/L (ref 3.5–5.1)
Sodium: 136 mEq/L (ref 135–145)
Total Bilirubin: 0.6 mg/dL (ref 0.2–1.2)
Total Protein: 7.2 g/dL (ref 6.0–8.3)

## 2019-03-29 LAB — CBC WITH DIFFERENTIAL/PLATELET
Basophils Absolute: 0 10*3/uL (ref 0.0–0.1)
Basophils Relative: 0.3 % (ref 0.0–3.0)
Eosinophils Absolute: 0.1 10*3/uL (ref 0.0–0.7)
Eosinophils Relative: 1.4 % (ref 0.0–5.0)
HCT: 49.7 % (ref 39.0–52.0)
Hemoglobin: 16.7 g/dL (ref 13.0–17.0)
Lymphocytes Relative: 21 % (ref 12.0–46.0)
Lymphs Abs: 2.2 10*3/uL (ref 0.7–4.0)
MCHC: 33.6 g/dL (ref 30.0–36.0)
MCV: 90.7 fl (ref 78.0–100.0)
Monocytes Absolute: 0.8 10*3/uL (ref 0.1–1.0)
Monocytes Relative: 7.6 % (ref 3.0–12.0)
Neutro Abs: 7.1 10*3/uL (ref 1.4–7.7)
Neutrophils Relative %: 69.7 % (ref 43.0–77.0)
Platelets: 201 10*3/uL (ref 150.0–400.0)
RBC: 5.48 Mil/uL (ref 4.22–5.81)
RDW: 13.8 % (ref 11.5–15.5)
WBC: 10.2 10*3/uL (ref 4.0–10.5)

## 2019-03-29 LAB — FERRITIN: Ferritin: 176.7 ng/mL (ref 22.0–322.0)

## 2019-03-29 LAB — SEDIMENTATION RATE: Sed Rate: 19 mm/hr (ref 0–20)

## 2019-03-29 LAB — IBC PANEL
Iron: 107 ug/dL (ref 42–165)
Saturation Ratios: 35.5 % (ref 20.0–50.0)
Transferrin: 215 mg/dL (ref 212.0–360.0)

## 2019-03-29 LAB — URIC ACID: Uric Acid, Serum: 4.6 mg/dL (ref 4.0–7.8)

## 2019-03-29 LAB — PSA: PSA: 1.73 ng/mL (ref 0.10–4.00)

## 2019-03-29 LAB — C-REACTIVE PROTEIN: CRP: <1 mg/dL (ref 0.5–20.0)

## 2019-03-29 LAB — TSH: TSH: 3.29 u[IU]/mL (ref 0.35–4.50)

## 2019-03-29 NOTE — Patient Instructions (Signed)
Good to see you Once scheduled for doppler and abi schedule an appointment with me after

## 2019-03-29 NOTE — Progress Notes (Signed)
[facility] [address] [place], [place] [address] Phone: [phone] Subjective:   I Kirk [name] am serving as a Education administrator for Dr. Hulan Saas.   CC: Leg pain and back pain follow-up  CBS:[id number]  Kirk [name] is a 78 y.o. male coming in with complaint of back pain. States that he isn't feeling any better than he did his last visit.  Patient is a 78 year old male who was found to have degenerative disc disease and lumbar spinal stenosis likely contributing to patient's leg weakness.  Patient unfortunately has not made any improvement.  Had an epidural-March 16, 2019.  Patient states only minimal improvement.  Patient has been going to formal physical therapy to work on balance and coordination and also does not feel it is making any significant improvement.  States that unfortunately legs still seem weak.  After 4 5 minutes of even standing he has difficulty.  Unable to walk long distances or even greater than 100 feet without his walker now.     Past Medical History:  Diagnosis Date  . Cataract   . Congenital glaucoma    "both eyes operated on when I was 55 months old"  . Cough   . Disturbance of skin sensation   . Diverticulosis of colon 2014  . Diverticulum of esophagus, acquired   . Dysphagia, unspecified(787.20)   . Encounter for long-term (current) use of other medications   . Herpes zoster without mention of complication   . Hyperglycemia   . Hyperlipidemia   . Hypertension   . 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 Lumbago   . Nevus, non-neoplastic   . Osteoarthrosis, unspecified whether generalized or localized, unspecified site    pt. denies  . Other premature beats   . Pain in limb   . Plantar fascial fibromatosis   . Special screening for malignant neoplasm of prostate   . Unspecified glaucoma(365.9)   . Unspecified pruritic disorder   . Unspecified vitamin D  deficiency   . Urinary frequency   . Zenker's diverticulum    Past Surgical History:  Procedure Laterality Date  . CATARACT EXTRACTION BILATERAL W/ ANTERIOR VITRECTOMY Bilateral 1977  . COLONOSCOPY  09/06/2013   [name]  . DIRECT LARYNGOSCOPY  10/22/2015   Cervical esophagoscopy. Endoscopic esophageal diverticulotomy.   Marland Kitchen [place]  . GLAUCOMA SURGERY  1945   congenital glaucoma  . HERNIA REPAIR  1993  . [place]  2008  . LARYNGOSCOPY     with stapling of zenkers diverticulum   Social History   Socioeconomic History  . Marital status: Married    Spouse name: Not on file  . Number of children: Not on file  . Years of education: Not on file  . Highest education level: Not on file  Occupational History  . Not on file  Social Needs  . Financial resource strain: Not on file  . Food insecurity    Worry: Not on file    Inability: Not on file  . Transportation needs    Medical: Not on file    Non-medical: Not on file  Tobacco Use  . Smoking status: Never Smoker  . Smokeless tobacco: Never Used  Substance and Sexual Activity  . Alcohol use: No  . Drug use: No  . Sexual activity: Not on file  Lifestyle  . Physical activity    Days per week: Not on file  Minutes per session: Not on file  . Stress: Not on file  Relationships  . Social Herbalist on phone: Not on file    Gets together: Not on file    Attends religious service: Not on file    Active member of club or organization: Not on file    Attends meetings of clubs or organizations: Not on file    Relationship status: Not on file  Other Topics Concern  . Not on file  Social History Narrative   Married   Lives at home with wife Kirk [name])   No regular exercise, but does his own yard work and household chores.   Never smoked   Alcohol none   Allergies  Allergen Reactions  . Reserpine Other (See Comments)    Unknown reaction  . Sulfa Antibiotics Other (See Comments)     Patient denies - Unknown reaction   Family History  Problem Relation Age of Onset  . Hypertension Mother   . Dementia Mother   . Kidney disease Father   . CAD Father   . Colon cancer Neg Hx   . Throat cancer Neg Hx   . Diabetes Neg Hx   . Liver disease Neg Hx      Current Outpatient Medications (Cardiovascular):  .  lisinopril-hydrochlorothiazide (ZESTORETIC) 20-12.5 MG tablet, TAKE ONE TABLET BY MOUTH DAILY     Current Outpatient Medications (Other):  .  brimonidine (ALPHAGAN) 0.2 % ophthalmic solution,  .  cholecalciferol (VITAMIN D) 400 units TABS tablet, Take 400 Units by mouth daily. Marland Kitchen  gabapentin (NEURONTIN) 100 MG capsule, Start with 1 tab po qhs X 1 week, then increase to 1 tab po bid X 1 week then 1 tab po tid prn .  timolol (TIMOPTIC) 0.5 % ophthalmic solution, Place 1 drop into the right eye daily.     Past medical history, social, surgical and family history all reviewed in electronic medical record.  No pertanent information unless stated regarding to the chief complaint.   Review of Systems:  No headache, visual changes, nausea, vomiting, diarrhea, constipation, dizziness, abdominal pain, skin rash, fevers, chills, night sweats, weight loss, swollen lymph nodes,chest pain, shortness of breath, mood changes.  Positive muscle aches and body aches  Objective  Blood pressure 110/80, pulse 84, height 6\' 1"  (1.854 m), SpO2 97 %.     General: No apparent distress alert and oriented x3 mood and affect normal, dressed appropriately.  HEENT: Patient does have significant glaucoma Respiratory: Patient's speak in full sentences and does not appear short of breath  Cardiovascular: 1+ lower extremity edema, non tender, no erythema  Skin: Warm dry intact with no signs of infection or rash on extremities or on axial skeleton.  Abdomen: Soft nontender  Neuro: Cranial nerves II through XII are intact, neurovascularly intact in all extremities  Lymph: No lymphadenopathy of  posterior or anterior cervical chain or axillae bilaterally.  Gait antalgic with weakness of the lower extremities MSK:  tender with limited range of motion and stability and shows weakness of the lower extremities.  Patient does have some atrophy of the leg muscles bilaterally.  1+ distal pulses noted of the dorsalis pedis bilaterally.  Does have a bluish hue to the skin.  Patient may have some mild peripheral neuropathy but difficult to tell.  I do get a fairly good capillary refill.  Leg strength 4 out of 5 but symmetric.  Tendon reflexes appear to be intact.  Back exam still has significant amount  of weakness and does have loss of lordosis with some degenerative scoliosis.  Significant tightness with straight leg test.      Impression and Recommendations:     This case required medical decision making of moderate complexity. The above documentation has been reviewed and is accurate and complete Kirk Pulley, DO       Note: This dictation was prepared with Dragon dictation along with smaller phrase technology. Any transcriptional errors that result from this process are unintentional.

## 2019-03-30 ENCOUNTER — Encounter: Payer: Self-pay | Admitting: Family Medicine

## 2019-03-30 NOTE — Assessment & Plan Note (Signed)
Encourage supplementation 

## 2019-03-30 NOTE — Assessment & Plan Note (Signed)
I believe that patient secondary to more of the lower back.  States that it has not made any improvement with the epidural patient does not want to try another epidural at this time.  For the bilateral leg weakness we will do a further work-up including laboratory work-up as well as peripheral vascular disease to see if this could be contributing.  If these are somewhat normal plan unfortunately we will consider the possibility of a nerve conduction study.  Patient wanted to hold on that at this moment.  We discussed different medications but I am concerned with increasing the gabapentin or other medicines secondary to the balance issue he is already having.  Patient also needs to avoid anti-inflammatories secondary to the glaucoma as possible.  We discussed this with him and his wife in great detail.  Patient will follow-up after all the testing we will discuss further.  Spent  25 minutes with patient face-to-face and had greater than 50% of counseling including as described above in assessment and plan.

## 2019-03-30 NOTE — Assessment & Plan Note (Signed)
May need nerve conduction test to further evaluate

## 2019-04-01 LAB — CYCLIC CITRUL PEPTIDE ANTIBODY, IGG: Cyclic Citrullin Peptide Ab: <16 UNITS

## 2019-04-01 LAB — PTH, INTACT AND CALCIUM
Calcium: 10.1 mg/dL (ref 8.6–10.3)
PTH: 27 pg/mL (ref 14–64)

## 2019-04-01 LAB — ANGIOTENSIN CONVERTING ENZYME: Angiotensin-Converting Enzyme: <5 U/L — ABNORMAL LOW (ref 9–67)

## 2019-04-01 LAB — VITAMIN D 1,25 DIHYDROXY
Vitamin D 1, 25 (OH)2 Total: 36 pg/mL (ref 18–72)
Vitamin D2 1, 25 (OH)2: <8 pg/mL
Vitamin D3 1, 25 (OH)2: 36 pg/mL

## 2019-04-01 LAB — RHEUMATOID FACTOR: Rheumatoid fact SerPl-aCnc: <14 IU/mL (ref <14)

## 2019-04-01 LAB — D-DIMER, QUANTITATIVE: D-Dimer, Quant: 0.47 mcg/mL FEU (ref <0.50)

## 2019-04-01 LAB — CALCIUM, IONIZED: Calcium, Ion: 5.57 mg/dL (ref 4.8–5.6)

## 2019-04-01 LAB — ANA: Anti Nuclear Antibody (ANA): NEGATIVE

## 2019-04-03 ENCOUNTER — Ambulatory Visit (HOSPITAL_BASED_OUTPATIENT_CLINIC_OR_DEPARTMENT_OTHER)
Admission: RE | Admit: 2019-04-03 | Discharge: 2019-04-03 | Disposition: A | Discharge status: Home / Self Care | Payer: PPO | Source: Ambulatory Visit | Attending: Family Medicine | Admitting: Family Medicine

## 2019-04-03 ENCOUNTER — Other Ambulatory Visit: Payer: Self-pay

## 2019-04-03 ENCOUNTER — Ambulatory Visit (HOSPITAL_COMMUNITY)
Admission: RE | Admit: 2019-04-03 | Discharge: 2019-04-03 | Disposition: A | Discharge status: Home / Self Care | Payer: PPO | Source: Ambulatory Visit | Attending: Cardiology | Admitting: Cardiology

## 2019-04-03 DIAGNOSIS — M79604 Pain in right leg: Secondary | ICD-10-CM

## 2019-04-03 DIAGNOSIS — M79605 Pain in left leg: Secondary | ICD-10-CM | POA: Diagnosis not present

## 2019-04-17 ENCOUNTER — Other Ambulatory Visit: Payer: Self-pay

## 2019-04-17 ENCOUNTER — Encounter: Payer: Self-pay | Admitting: Family Medicine

## 2019-04-17 ENCOUNTER — Ambulatory Visit (INDEPENDENT_AMBULATORY_CARE_PROVIDER_SITE_OTHER): Payer: PPO | Admitting: Family Medicine

## 2019-04-17 VITALS — BP 110/70 | HR 73 | Ht 73.0 in

## 2019-04-17 DIAGNOSIS — M5416 Radiculopathy, lumbar region: Secondary | ICD-10-CM | POA: Diagnosis not present

## 2019-04-17 DIAGNOSIS — M48061 Spinal stenosis, lumbar region without neurogenic claudication: Secondary | ICD-10-CM

## 2019-04-17 DIAGNOSIS — G629 Polyneuropathy, unspecified: Secondary | ICD-10-CM

## 2019-04-17 MED ORDER — CYANOCOBALAMIN 1000 MCG/ML IJ SOLN
1000.0000 ug | Freq: Once | INTRAMUSCULAR | Status: AC
  Administered 2019-04-17: 1000 ug via INTRAMUSCULAR

## 2019-04-17 NOTE — Assessment & Plan Note (Signed)
I still believe the patient's degenerative lumbar spinal stenosis is likely contributing to most of this.  Patient has been somewhat noncompliant.  After discussing with him and his wife at great instances we discussed which other treatment options are available.  Patient encouraged to try the gabapentin at night, aquatic therapy and repeat epidural at L4-L5.  If this does not work I do suggest the patient has been nerve conduction study.  This would help [place] more with the neuropathy versus the possibility of neurogenic claudication.  Patient's ABIs and Doppler showed no significant vascular compromise.  Patient will follow-up with me after the epidurals in the physical therapy level would want nerve conduction study before we did see him if no improvement the other modalities.  Spent  25 minutes with patient face-to-face and had greater than 50% of counseling including as described above in assessment and plan.

## 2019-04-17 NOTE — Patient Instructions (Signed)
See me back for a virtual visit 2 weeks after the epidural [phone]

## 2019-04-17 NOTE — Progress Notes (Signed)
[name] [place], [place] [address] Phone: [phone] Subjective:     CC: Back pain follow-up leg pain follow-up  [id number]   03/29/2019: I believe that patient secondary to more of the lower back.  States that it has not made any improvement with the epidural patient does not want to try another epidural at this time.  For the bilateral leg weakness we will do a further work-up including laboratory work-up as well as peripheral vascular disease to see if this could be contributing.  If these are somewhat normal plan unfortunately we will consider the possibility of a nerve conduction study.  Patient wanted to hold on that at this moment.  We discussed different medications but I am concerned with increasing the gabapentin or other medicines secondary to the balance issue he is already having.  Patient also needs to avoid anti-inflammatories secondary to the glaucoma as possible.  We discussed this with him and his wife in great detail.  Patient will follow-up after all the testing we will discuss further.  Spent  25 minutes with patient face-to-face and had greater than 50% of counseling including as described above in assessment and plan.  Update 04/17/2019: Kirk [name] is a 78 y.o. male coming in with complaint of back pain. Patient states that his back is not any better. Epidural did not help his pain. Constant pain with standing and walking with pain into the groin and into his legs to his feet.       Past Medical History:  Diagnosis Date  . Cataract   . Congenital glaucoma    "both eyes operated on when I was 5 months old"  . Cough   . Disturbance of skin sensation   . Diverticulosis of colon 2014  . Diverticulum of esophagus, acquired   . Dysphagia, unspecified(787.20)   . Encounter for long-term (current) use of other medications   . Herpes zoster without mention of complication   . Hyperglycemia   . Hyperlipidemia   .  Hypertension   . 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 Lumbago   . Nevus, non-neoplastic   . Osteoarthrosis, unspecified whether generalized or localized, unspecified site    pt. denies  . Other premature beats   . Pain in limb   . Plantar fascial fibromatosis   . Special screening for malignant neoplasm of prostate   . Unspecified glaucoma(365.9)   . Unspecified pruritic disorder   . Unspecified vitamin D deficiency   . Urinary frequency   . Zenker's diverticulum    Past Surgical History:  Procedure Laterality Date  . CATARACT EXTRACTION BILATERAL W/ ANTERIOR VITRECTOMY Bilateral 1977  . COLONOSCOPY  09/06/2013   [name]  . DIRECT LARYNGOSCOPY  10/22/2015   Cervical esophagoscopy. Endoscopic esophageal diverticulotomy.   Marland Kitchen [place]  . GLAUCOMA SURGERY  1945   congenital glaucoma  . HERNIA REPAIR  1993  . [place]  2008  . LARYNGOSCOPY     with stapling of zenkers diverticulum   Social History   Socioeconomic History  . Marital status: Married    Spouse name: Not on file  . Number of children: Not on file  . Years of education: Not on file  . Highest education level: Not on file  Occupational History  . Not on file  Social Needs  . Financial resource strain: Not on file  . Food insecurity  Worry: Not on file    Inability: Not on file  . Transportation needs    Medical: Not on file    Non-medical: Not on file  Tobacco Use  . Smoking status: Never Smoker  . Smokeless tobacco: Never Used  Substance and Sexual Activity  . Alcohol use: No  . Drug use: No  . Sexual activity: Not on file  Lifestyle  . Physical activity    Days per week: Not on file    Minutes per session: Not on file  . Stress: Not on file  Relationships  . Social Herbalist on phone: Not on file    Gets together: Not on file    Attends religious service: Not on file    Active member  of club or organization: Not on file    Attends meetings of clubs or organizations: Not on file    Relationship status: Not on file  Other Topics Concern  . Not on file  Social History Narrative   Married   Lives at home with wife Pamala Hurry)   No regular exercise, but does his own yard work and household chores.   Never smoked   Alcohol none   Allergies  Allergen Reactions  . Reserpine Other (See Comments)    Unknown reaction  . Sulfa Antibiotics Other (See Comments)    Patient denies - Unknown reaction   Family History  Problem Relation Age of Onset  . Hypertension Mother   . Dementia Mother   . Kidney disease Father   . CAD Father   . Colon cancer Neg Hx   . Throat cancer Neg Hx   . Diabetes Neg Hx   . Liver disease Neg Hx      Current Outpatient Medications (Cardiovascular):  .  lisinopril-hydrochlorothiazide (ZESTORETIC) 20-12.5 MG tablet, TAKE ONE TABLET BY MOUTH DAILY     Current Outpatient Medications (Other):  .  brimonidine (ALPHAGAN) 0.2 % ophthalmic solution,  .  cholecalciferol (VITAMIN D) 400 units TABS tablet, Take 400 Units by mouth daily. Marland Kitchen  gabapentin (NEURONTIN) 100 MG capsule, Start with 1 tab po qhs X 1 week, then increase to 1 tab po bid X 1 week then 1 tab po tid prn .  timolol (TIMOPTIC) 0.5 % ophthalmic solution, Place 1 drop into the right eye daily.     Past medical history, social, surgical and family history all reviewed in electronic medical record.  No pertanent information unless stated regarding to the chief complaint.   Review of Systems:  No headache, visual changes, nausea, vomiting, diarrhea, constipation, dizziness, abdominal pain, skin rash, fevers, chills, night sweats, weight loss, swollen lymph nodes, body aches, joint swelling,  chest pain, shortness of breath, mood changes.  Positive muscle aches Objective  Blood pressure 110/70, pulse 73, height 6\' 1"  (1.854 m), SpO2 96 %.    General: No apparent distress alert and  oriented x3 mood and affect normal, dressed appropriately.  HEENT: Glaucoma noted in both eyes Respiratory: Patient's speak in full sentences and does not appear short of breath  Cardiovascular: Trace lower extremity edema, non tender, no erythema  Skin: Warm dry intact with no signs of infection or rash on extremities or on axial skeleton.  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 Lymph: No lymphadenopathy of posterior or anterior cervical chain or axillae bilaterally.  Gait antalgic walking with the aid of a walker. MSK:  Moderate to severe arthritic changes of multiple joints. Back exam has loss of lordosis.  Mild positive straight leg test bilaterally.  Patient has 4-5 strength in lower extremity deep tendon reflexes are 1+.  Distal pulses are 1+.     Impression and Recommendations:     This case required medical decision making of moderate complexity. The above documentation has been reviewed and is accurate and complete Lyndal Pulley, DO       Note: This dictation was prepared with Dragon dictation along with smaller phrase technology. Any transcriptional errors that result from this process are unintentional.

## 2019-04-19 DIAGNOSIS — H01005 Unspecified blepharitis left lower eyelid: Secondary | ICD-10-CM | POA: Diagnosis not present

## 2019-04-19 DIAGNOSIS — H01002 Unspecified blepharitis right lower eyelid: Secondary | ICD-10-CM | POA: Diagnosis not present

## 2019-04-19 DIAGNOSIS — H01001 Unspecified blepharitis right upper eyelid: Secondary | ICD-10-CM | POA: Diagnosis not present

## 2019-04-19 DIAGNOSIS — H01004 Unspecified blepharitis left upper eyelid: Secondary | ICD-10-CM | POA: Diagnosis not present

## 2019-04-25 ENCOUNTER — Other Ambulatory Visit: Payer: Self-pay

## 2019-04-25 ENCOUNTER — Ambulatory Visit
Admission: RE | Admit: 2019-04-25 | Discharge: 2019-04-25 | Disposition: A | Discharge status: Home / Self Care | Payer: PPO | Source: Ambulatory Visit | Attending: Family Medicine | Admitting: Family Medicine

## 2019-04-25 DIAGNOSIS — M5416 Radiculopathy, lumbar region: Secondary | ICD-10-CM

## 2019-04-25 DIAGNOSIS — M48062 Spinal stenosis, lumbar region with neurogenic claudication: Secondary | ICD-10-CM | POA: Diagnosis not present

## 2019-04-25 MED ORDER — IOPAMIDOL (ISOVUE-M 200) INJECTION 41%
1.0000 mL | Freq: Once | INTRAMUSCULAR | Status: AC
  Administered 2019-04-25: 1 mL via EPIDURAL

## 2019-04-25 MED ORDER — METHYLPREDNISOLONE ACETATE 40 MG/ML INJ SUSP (RADIOLOG
120.0000 mg | Freq: Once | INTRAMUSCULAR | Status: AC
  Administered 2019-04-25: 120 mg via EPIDURAL

## 2019-04-25 NOTE — Discharge Instructions (Signed)

## 2019-04-26 ENCOUNTER — Other Ambulatory Visit: Payer: PPO

## 2019-05-11 ENCOUNTER — Other Ambulatory Visit: Payer: Self-pay

## 2019-05-11 ENCOUNTER — Ambulatory Visit (INDEPENDENT_AMBULATORY_CARE_PROVIDER_SITE_OTHER): Payer: PPO | Admitting: Family Medicine

## 2019-05-11 ENCOUNTER — Encounter: Payer: Self-pay | Admitting: Family Medicine

## 2019-05-11 DIAGNOSIS — M48061 Spinal stenosis, lumbar region without neurogenic claudication: Secondary | ICD-10-CM

## 2019-05-11 MED ORDER — MELOXICAM 7.5 MG PO TABS: 7.5000 mg | ORAL_TABLET | Freq: Every day | ORAL | 0 refills | Status: DC

## 2019-05-11 MED ORDER — DULOXETINE HCL 20 MG PO CPEP: 20.0000 mg | ORAL_CAPSULE | Freq: Every day | ORAL | 0 refills | Status: DC

## 2019-05-11 NOTE — Assessment & Plan Note (Signed)
Discussed with patient again at great length with his wife at his side.  We discussed that I still believe that is likely secondary to the back and we could consider showing this with more of a nerve conduction study to further evaluate the idea of neuropathy versus spinal stenosis.  Patient given 2 medications which include meloxicam and 7.5 daily and warned of potential side effects and a low dose of Cymbalta.  Patient does not want to have another epidural.  Was responding well to physical therapy before shutdown occurred.  Patient will start with physical therapy again and given another 6 weeks.  We discussed if this seems to fail patient's only options will be epidural versus neurosurgery consult spent  25 minutes with patient face-to-face and had greater than 50% of counseling including as described above in assessment and plan.

## 2019-05-11 NOTE — Patient Instructions (Addendum)
[place] [phone] See me in 6-7 weeks

## 2019-05-11 NOTE — Progress Notes (Signed)
[name] [place], [place] [address] Phone: [phone] Subjective:   Kirk [name], am serving as a scribe for Dr. Hulan Saas.    CC: Back pain  [id number]   04/17/2019 I still believe the patient's degenerative lumbar spinal stenosis is likely contributing to most of this.  Patient has been somewhat noncompliant.  After discussing with him and his wife at great instances we discussed which other treatment options are available.  Patient encouraged to try the gabapentin at night, aquatic therapy and repeat epidural at L4-L5.  If this does not work I do suggest the patient has been nerve conduction study.  This would help [place] more with the neuropathy versus the possibility of neurogenic claudication.  Patient's ABIs and Doppler showed [name] significant vascular compromise.  Patient will follow-up with me after the epidurals in the physical therapy level would want nerve conduction study before we did see him if [name] improvement the other modalities.  Spent  25 minutes with patient face-to-face and had greater than 50% of counseling including as described above in assessment and plan.  Update 05/11/2019 Kirk [name] is a 78 y.o. male coming in with complaint of back pain. Is following up after epidural on 04/25/2019. Has not had relief from the epidural. Continues to have balance issues. Feels like his legs are weak when he walks. Pain is intermittent but occurs with sit to stand and with walking.  Continues to have difficulty overall.  Patient is feels like his legs become weaker and weaker at this time.  Patient denies any fevers chills or any abnormal weight loss.     Past Medical History:  Diagnosis Date  . Cataract   . Congenital glaucoma    "both eyes operated on when I was 78 months old"  . Cough   . Disturbance of skin sensation   . Diverticulosis of colon 2014  . Diverticulum of esophagus, acquired   . Dysphagia, unspecified(787.20)    . Encounter for long-term (current) use of other medications   . Herpes zoster without mention of complication   . Hyperglycemia   . Hyperlipidemia   . Hypertension   . 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 Lumbago   . Nevus, non-neoplastic   . Osteoarthrosis, unspecified whether generalized or localized, unspecified site    pt. denies  . Other premature beats   . Pain in limb   . Plantar fascial fibromatosis   . Special screening for malignant neoplasm of prostate   . Unspecified glaucoma(365.9)   . Unspecified pruritic disorder   . Unspecified vitamin D deficiency   . Urinary frequency   . Zenker's diverticulum    Past Surgical History:  Procedure Laterality Date  . CATARACT EXTRACTION BILATERAL W/ ANTERIOR VITRECTOMY Bilateral 1977  . COLONOSCOPY  09/06/2013   [name]  . DIRECT LARYNGOSCOPY  10/22/2015   Cervical esophagoscopy. Endoscopic esophageal diverticulotomy.   Marland Kitchen [place]  . GLAUCOMA SURGERY  1945   congenital glaucoma  . HERNIA REPAIR  1993  . [place]  2008  . LARYNGOSCOPY     with stapling of zenkers diverticulum   Social History   Socioeconomic History  . Marital status: Married    Spouse name: Not on file  . Number of children: Not on file  . Years of education: Not on file  . Highest education level: Not on file  Occupational  History  . Not on file  Social Needs  . Financial resource strain: Not on file  . Food insecurity    Worry: Not on file    Inability: Not on file  . Transportation needs    Medical: Not on file    Non-medical: Not on file  Tobacco Use  . Smoking status: Never Smoker  . Smokeless tobacco: Never Used  Substance and Sexual Activity  . Alcohol use: [name]  . Drug use: [name]  . Sexual activity: Not on file  Lifestyle  . Physical activity    Days per week: Not on file    Minutes per session: Not on file  . Stress: Not on file   Relationships  . Social Herbalist on phone: Not on file    Gets together: Not on file    Attends religious service: Not on file    Active member of club or organization: Not on file    Attends meetings of clubs or organizations: Not on file    Relationship status: Not on file  Other Topics Concern  . Not on file  Social History Narrative   Married   Lives at home with wife Pamala Hurry)   [name] regular exercise, but does his own yard work and household chores.   Never smoked   Alcohol none   Allergies  Allergen Reactions  . Reserpine Other (See Comments)    Unknown reaction  . Sulfa Antibiotics Other (See Comments)    Patient denies - Unknown reaction   Family History  Problem Relation Age of Onset  . Hypertension Mother   . Dementia Mother   . Kidney disease Father   . CAD Father   . Colon cancer Neg Hx   . Throat cancer Neg Hx   . Diabetes Neg Hx   . Liver disease Neg Hx      Current Outpatient Medications (Cardiovascular):  .  lisinopril-hydrochlorothiazide (ZESTORETIC) 20-12.5 MG tablet, TAKE ONE TABLET BY MOUTH DAILY   Current Outpatient Medications (Analgesics):  .  meloxicam (MOBIC) 7.5 MG tablet, Take 1 tablet (7.5 mg total) by mouth daily.   Current Outpatient Medications (Other):  .  brimonidine (ALPHAGAN) 0.2 % ophthalmic solution,  .  cholecalciferol (VITAMIN D) 400 units TABS tablet, Take 400 Units by mouth daily. Marland Kitchen  gabapentin (NEURONTIN) 100 MG capsule, Start with 1 tab po qhs X 1 week, then increase to 1 tab po bid X 1 week then 1 tab po tid prn .  timolol (TIMOPTIC) 0.5 % ophthalmic solution, Place 1 drop into the right eye daily.  .  DULoxetine (CYMBALTA) 20 MG capsule, Take 1 capsule (20 mg total) by mouth daily.    Past medical history, social, surgical and family history all reviewed in electronic medical record.  [name]   Review of Systems:  [name] headache, visual  changes, nausea, vomiting, diarrhea, constipation, dizziness, abdominal pain, skin rash, fevers, chills, night sweats, weight loss, swollen lymph nodes, , chest pain, shortness of breath, mood changes.  Positive muscle aches, body aches  Objective  Blood pressure 124/86, pulse 81, height 6\' 1"  (1.854 m), SpO2 98 %.    General: [name] apparent distress alert and oriented x3 mood and affect normal, dressed appropriately.  HEENT: Pupils equal, extraocular movements intact glaucoma noted Respiratory: Patient's speak in full sentences and does not appear short of breath  Cardiovascular: [name] lower extremity edema, non tender, [name] erythema atrophy of  the lower extremity bilaterally Skin: Warm dry intact with [name] signs of infection or rash on extremities or on axial skeleton.  Abdomen: Soft nontender  Neuro: Cranial nerves II through XII are intact, neurovascularly intact in all extremities with 2+ DTRs and 2+ pulses.  Lymph: [name] lymphadenopathy of posterior or anterior cervical chain or axillae bilaterally.  Gait severely antalgic walking with the aid of a walker. MSK:  Non tender with full range of motion and good stability and symmetric strength and tone of shoulders, elbows, wrist,  Patient has 4-5 strength of the lower extremities.  Patient is deep tendon reflexes though are 1+.  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   Impression and Recommendations:     This case required medical decision making of moderate complexity. The above documentation has been reviewed and is accurate and complete Kirk Pulley, DO       Note: This dictation was prepared with Dragon dictation along with smaller phrase technology. Any transcriptional errors that result from this process are unintentional.

## 2019-05-14 ENCOUNTER — Telehealth: Payer: Self-pay

## 2019-05-14 NOTE — Telephone Encounter (Signed)
Spoke with patient who mentioned that he is going to try physical therapy at [facility] instead of the aquatic therapy at [facility]. Referral switched to [facility] and patient given their phone number to schedule.

## 2019-05-15 ENCOUNTER — Ambulatory Visit: Payer: PPO | Admitting: Physical Therapy

## 2019-05-21 ENCOUNTER — Ambulatory Visit: Payer: PPO | Admitting: Physical Therapy

## 2019-05-21 ENCOUNTER — Other Ambulatory Visit: Payer: Self-pay

## 2019-05-21 ENCOUNTER — Encounter: Payer: Self-pay | Admitting: Physical Therapy

## 2019-05-21 ENCOUNTER — Ambulatory Visit (INDEPENDENT_AMBULATORY_CARE_PROVIDER_SITE_OTHER): Payer: PPO

## 2019-05-21 VITALS — BP 130/68 | Temp 97.7°F | Ht 73.0 in | Wt 187.8 lb

## 2019-05-21 DIAGNOSIS — Z0000 Encounter for general adult medical examination without abnormal findings: Secondary | ICD-10-CM | POA: Diagnosis not present

## 2019-05-21 DIAGNOSIS — M5442 Lumbago with sciatica, left side: Secondary | ICD-10-CM

## 2019-05-21 DIAGNOSIS — M5441 Lumbago with sciatica, right side: Secondary | ICD-10-CM

## 2019-05-21 DIAGNOSIS — G8929 Other chronic pain: Secondary | ICD-10-CM | POA: Diagnosis not present

## 2019-05-21 NOTE — Progress Notes (Signed)
I have personally reviewed the Medicare Annual Wellness Visit and agree with the assessment and plan.  Algis Greenhouse. Jerline Pain, MD 05/21/2019 2:54 PM

## 2019-05-21 NOTE — Patient Instructions (Signed)
Access Code: [email or web address]  URL: [email or web address]  Date: 05/21/2019  Prepared by: Lyndee Hensen   Exercises Seated Hamstring Stretch - 3 reps - 30 hold - 2x daily Single Knee to Chest Stretch - 3 reps - 30 hold - 2x daily Supine Piriformis Stretch Pulling Heel to Hip - 3 reps - 30 hold - 2x daily Supine Lower Trunk Rotation - 10 reps - 10 hold - 2x daily Supine March - 10 reps - 2 sets - 3 hold - 2x daily Standing Hip Abduction - 10 reps - 1-2 sets - 1x daily Seated Knee Extension AROM - 10 reps - 2 sets - 1x daily

## 2019-05-21 NOTE — Progress Notes (Signed)
Subjective:   Kirk [name] is a 78 y.o. male who presents for Medicare Annual/Subsequent preventive examination.  Review of Systems:   Cardiac Risk Factors include: advanced age (>25men, >54 women);hypertension     Objective:    Vitals: BP 130/68   Temp 97.7 F (36.5 C)   Ht 6\' 1"  (1.854 m)   Wt 187 lb 12.8 oz (85.2 kg)   BMI 24.78 kg/m   Body mass index is 24.78 kg/m.  Advanced Directives 12/13/2018 01/11/2017 12/22/2016 07/13/2016 02/25/2016 01/07/2016 01/06/2016  Does Patient Have a Medical Advance Directive? No No No No No No;Yes Yes;No  Does patient want to make changes to medical advance directive? - - - - - No - Patient declined No - Patient declined  Copy of [place] in Chart? - - - - - No - copy requested -  Would patient like information on creating a medical advance directive? No - Patient declined - Yes (MAU/Ambulatory/Procedural Areas - Information given) - - - -    Tobacco Social History   Tobacco Use  Smoking Status Never Smoker  Smokeless Tobacco Never Used      Clinical Intake:  Pre-visit preparation completed: Yes  Nutritional Risks: Unintentional weight gain Diabetes: No  How often do you need to have someone help you when you read instructions, pamphlets, or other written materials from your doctor or pharmacy?: 3 - Sometimes  Interpreter Needed?: No  Comments: wife present during visit Information entered by :: Denman George LPN  Past Medical History:  Diagnosis Date  . Cataract   . Congenital glaucoma    "both eyes operated on when I was 83 months old"  . Cough   . Disturbance of skin sensation   . Diverticulosis of colon 2014  . Diverticulum of esophagus, acquired   . Dysphagia, unspecified(787.20)   . Encounter for long-term (current) use of other medications   . Herpes zoster without mention of complication   . Hyperglycemia   . Hyperlipidemia   . Hypertension   . Hypertrophy of prostate without urinary  obstruction and other lower urinary tract symptoms (LUTS)   . Lumbago   . Nevus, non-neoplastic   . Osteoarthrosis, unspecified whether generalized or localized, unspecified site    pt. denies  . Other premature beats   . Pain in limb   . Plantar fascial fibromatosis   . Special screening for malignant neoplasm of prostate   . Unspecified glaucoma(365.9)   . Unspecified pruritic disorder   . Unspecified vitamin D deficiency   . Urinary frequency   . Zenker's diverticulum    Past Surgical History:  Procedure Laterality Date  . CATARACT EXTRACTION BILATERAL W/ ANTERIOR VITRECTOMY Bilateral 1977  . COLONOSCOPY  09/06/2013   [name]  . DIRECT LARYNGOSCOPY  10/22/2015   Cervical esophagoscopy. Endoscopic esophageal diverticulotomy.   Marland Kitchen [place]  . GLAUCOMA SURGERY  1945   congenital glaucoma  . HERNIA REPAIR  1993  . [place]  2008  . LARYNGOSCOPY     with stapling of zenkers diverticulum   Family History  Problem Relation Age of Onset  . Hypertension Mother   . Dementia Mother   . Kidney disease Father   . CAD Father   . Colon cancer Neg Hx   . Throat cancer Neg Hx   . Diabetes Neg Hx   . Liver disease Neg Hx    Social History   Socioeconomic History  . Marital status: Married  Spouse name: Not on file  . Number of children: Not on file  . Years of education: Not on file  . Highest education level: Not on file  Occupational History  . Not on file  Social Needs  . Financial resource strain: Not on file  . Food insecurity    Worry: Not on file    Inability: Not on file  . Transportation needs    Medical: Not on file    Non-medical: Not on file  Tobacco Use  . Smoking status: Never Smoker  . Smokeless tobacco: Never Used  Substance and Sexual Activity  . Alcohol use: No  . Drug use: No  . Sexual activity: Not on file  Lifestyle  . Physical activity    Days per week: Not on file    Minutes per session: Not on file  .  Stress: Not on file  Relationships  . Social Herbalist on phone: Not on file    Gets together: Not on file    Attends religious service: Not on file    Active member of club or organization: Not on file    Attends meetings of clubs or organizations: Not on file    Relationship status: Not on file  Other Topics Concern  . Not on file  Social History Narrative   Married   Lives at home with wife Pamala Hurry)   No regular exercise, but does his own yard work and household chores.   Never smoked   Alcohol none    Outpatient Encounter Medications as of 05/21/2019  Medication Sig  . brimonidine (ALPHAGAN) 0.2 % ophthalmic solution   . cholecalciferol (VITAMIN D) 400 units TABS tablet Take 400 Units by mouth daily.  Marland Kitchen lisinopril-hydrochlorothiazide (ZESTORETIC) 20-12.5 MG tablet TAKE ONE TABLET BY MOUTH DAILY  . timolol (TIMOPTIC) 0.5 % ophthalmic solution Place 1 drop into the right eye daily.   Marland Kitchen acetaminophen (TYLENOL) 325 MG tablet Take by mouth.  . DULoxetine (CYMBALTA) 20 MG capsule Take 1 capsule (20 mg total) by mouth daily. (Patient not taking: Reported on 05/21/2019)  . gabapentin (NEURONTIN) 100 MG capsule Start with 1 tab po qhs X 1 week, then increase to 1 tab po bid X 1 week then 1 tab po tid prn (Patient not taking: Reported on 05/21/2019)  . meloxicam (MOBIC) 7.5 MG tablet Take 1 tablet (7.5 mg total) by mouth daily. (Patient not taking: Reported on 05/21/2019)   No facility-administered encounter medications on file as of 05/21/2019.     Activities of Daily Living In your present state of health, do you have any difficulty performing the following activities: 05/21/2019  Hearing? N  Vision? Y  Difficulty concentrating or making decisions? N  Walking or climbing stairs? Y  Dressing or bathing? N  Doing errands, shopping? Y  Preparing Food and eating ? N  Using the Toilet? N  In the past six months, have you accidently leaked urine? N  Do you have problems with  loss of bowel control? N  Managing your Medications? N  Managing your Finances? N  Housekeeping or managing your Housekeeping? N  Some recent data might be hidden    Patient Care Team: [name], MD as PCP - General (Family Medicine) Lyndal Pulley, DO as Consulting Physician (Family Medicine) [place], Peter M, MD as Consulting Physician (Cardiology)   Assessment:   This is a routine wellness examination for [place].  Exercise Activities and Dietary recommendations Current Exercise Habits: The  patient does not participate in regular exercise at present, Exercise limited by: neurologic condition(s)  Goals    . Patient Stated (pt-stated)     Regain strength and mobility  Increase appetite        Fall Risk Fall Risk  05/21/2019 11/13/2018 10/05/2017 12/22/2016 07/13/2016  Falls in the past year? 1 0 No No No  Number falls in past yr: 1 - - - -  Injury with Fall? 0 - - - -  Risk for fall due to : History of fall(s);Impaired vision;Impaired mobility - - - -  Follow up Education provided;Falls prevention discussed - - - -   Timed Get Up and Go Performed: completed within normal time frame   Depression Screen PHQ 2/9 Scores 11/13/2018 10/05/2017 12/22/2016 07/13/2016  PHQ - 2 Score 0 0 0 0    Cognitive Function MMSE - Mini Mental State Exam 12/22/2016 08/20/2015  Orientation to time 5 4  Orientation to Place 5 5  Registration 3 3  Attention/ Calculation 5 5  Recall 3 1  Language- name 2 objects 2 2  Language- repeat 1 1  Language- follow 3 step command 3 3  Language- read & follow direction 1 1  Write a sentence 1 1  Copy design 1 1  Total score 30 27        Immunization History  Administered Date(s) Administered  . Influenza, High Dose Seasonal PF 07/21/2017  . Influenza,inj,Quad PF,6+ Mos 07/13/2016  . Pneumococcal Conjugate-13 01/12/2012  . Td 03/05/2011    Qualifies for Shingles Vaccine? Discussed and patient will check with pharmacy for coverage.  Patient  education handout provided    Screening Tests Health Maintenance  Topic Date Due  . PNA vac Low Risk Adult (2 of 2 - PPSV23) 01/11/2013  . INFLUENZA VACCINE  05/05/2019  . TETANUS/TDAP  03/04/2021   Cancer Screenings: Colorectal: colonoscopy 09/06/13        Plan:    I have personally reviewed and addressed the Medicare Annual Wellness questionnaire and have noted the following in the patient's chart:  A. Medical and social history B. Use of alcohol, tobacco or illicit drugs  C. Current medications and supplements D. Functional ability and status E.  Nutritional status F.  Physical activity G. Advance directives H. List of other physicians I.  Hospitalizations, surgeries, and ER visits in previous 12 months J.  [place] such as hearing and vision if needed, cognitive and depression L. Referrals, records requested, and appointments- none   In addition, I have reviewed and discussed with patient certain preventive protocols, quality metrics, and best practice recommendations. A written personalized care plan for preventive services as well as general preventive health recommendations were provided to patient.   Signed,  Denman George, LPN  Nurse Health Advisor   Nurse Notes:  Patient and wife concerned with recent increase in fatigue.  He is not taking anything for pain other than Tylenol.  Is restarting PT.  Would like to know if labs would be warranted.

## 2019-05-21 NOTE — Patient Instructions (Signed)
Kirk [name] , Thank you for taking time to come for your Medicare Wellness Visit. I appreciate your ongoing commitment to your health goals. Please review the following plan we discussed and let me know if I can assist you in the future.   Screening recommendations/referrals: Colorectal Screening: up to date; last 09/06/13  Vision and Dental Exams: Recommended annual ophthalmology exams for early detection of glaucoma and other disorders of the eye Recommended annual dental exams for proper oral hygiene  Vaccinations: Influenza vaccine:  recommended this fall either at PCP office or through your local pharmacy  Pneumococcal vaccine: last 01/12/12 Tdap vaccine: last 03/05/11 Shingles vaccine: Please call your insurance company to determine your out of pocket expense for the Shingrix vaccine. You may receive this vaccine at your local pharmacy.  Advanced directives: Please bring a copy of your POA (Power of Attorney) and/or Living Will to your next appointment.  Goals: Recommend to remove any items from the home that may cause slips or trips.  Next appointment: Please schedule your Annual Wellness Visit with your Nurse Health Advisor in one year.  Preventive Care 2 Years and Older, Male Preventive care refers to lifestyle choices and visits with your health care provider that can promote health and wellness. What does preventive care include?  A yearly physical exam. This is also called an annual well check.  Dental exams once or twice a year.  Routine eye exams. Ask your health care provider how often you should have your eyes checked.  Personal lifestyle choices, including:  Daily care of your teeth and gums.  Regular physical activity.  Eating a healthy diet.  Avoiding tobacco and drug use.  Limiting alcohol use.  Practicing safe sex.  Taking low doses of aspirin every day if recommended by your health care provider..  Taking vitamin and mineral supplements as  recommended by your health care provider. What happens during an annual well check? The services and screenings done by your health care provider during your annual well check will depend on your age, overall health, lifestyle risk factors, and family history of disease. Counseling  Your health care provider may ask you questions about your:  Alcohol use.  Tobacco use.  Drug use.  Emotional well-being.  Home and relationship well-being.  Sexual activity.  Eating habits.  History of falls.  Memory and ability to understand (cognition).  Work and work Statistician. Screening  You may have the following tests or measurements:  Height, weight, and BMI.  Blood pressure.  Lipid and cholesterol levels. These may be checked every 5 years, or more frequently if you are over 65 years old.  Skin check.  Lung cancer screening. You may have this screening every year starting at age 45 if you have a 30-pack-year history of smoking and currently smoke or have quit within the past 15 years.  Fecal occult blood test (FOBT) of the stool. You may have this test every year starting at age 54.  Flexible sigmoidoscopy or colonoscopy. You may have a sigmoidoscopy every 5 years or a colonoscopy every 10 years starting at age 93.  Prostate cancer screening. Recommendations will vary depending on your family history and other risks.  Hepatitis C blood test.  Hepatitis B blood test.  Sexually transmitted disease (STD) testing.  Diabetes screening. This is done by checking your blood sugar (glucose) after you have not eaten for a while (fasting). You may have this done every 1-3 years.  Abdominal aortic aneurysm (AAA) screening. You may need  this if you are a current or former smoker.  Osteoporosis. You may be screened starting at age 54 if you are at high risk. Talk with your health care provider about your test results, treatment options, and if necessary, the need for more tests.  Vaccines  Your health care provider may recommend certain vaccines, such as:  Influenza vaccine. This is recommended every year.  Tetanus, diphtheria, and acellular pertussis (Tdap, Td) vaccine. You may need a Td booster every 10 years.  Zoster vaccine. You may need this after age 57.  Pneumococcal 13-valent conjugate (PCV13) vaccine. One dose is recommended after age 39.  Pneumococcal polysaccharide (PPSV23) vaccine. One dose is recommended after age 12. Talk to your health care provider about which screenings and vaccines you need and how often you need them. This information is not intended to replace advice given to you by your health care provider. Make sure you discuss any questions you have with your health care provider. Document Released: 10/17/2015 Document Revised: 06/09/2016 Document Reviewed: 07/22/2015 Elsevier Interactive Patient Education  2017 [place] Prevention in the Home Falls can cause injuries. They can happen to people of all ages. There are many things you can do to make your home safe and to help prevent falls. What can I do on the outside of my home?  Regularly fix the edges of walkways and driveways and fix any cracks.  Remove anything that might make you trip as you walk through a door, such as a raised step or threshold.  Trim any bushes or trees on the path to your home.  Use bright outdoor lighting.  Clear any walking paths of anything that might make someone trip, such as rocks or tools.  Regularly check to see if handrails are loose or broken. Make sure that both sides of any steps have handrails.  Any raised decks and porches should have guardrails on the edges.  Have any leaves, snow, or ice cleared regularly.  Use sand or salt on walking paths during winter.  Clean up any spills in your garage right away. This includes oil or grease spills. What can I do in the bathroom?  Use night lights.  Install grab bars by the toilet  and in the tub and shower. Do not use towel bars as grab bars.  Use non-skid mats or decals in the tub or shower.  If you need to sit down in the shower, use a plastic, non-slip stool.  Keep the floor dry. Clean up any water that spills on the floor as soon as it happens.  Remove soap buildup in the tub or shower regularly.  Attach bath mats securely with double-sided non-slip rug tape.  Do not have throw rugs and other things on the floor that can make you trip. What can I do in the bedroom?  Use night lights.  Make sure that you have a light by your bed that is easy to reach.  Do not use any sheets or blankets that are too big for your bed. They should not hang down onto the floor.  Have a firm chair that has side arms. You can use this for support while you get dressed.  Do not have throw rugs and other things on the floor that can make you trip. What can I do in the kitchen?  Clean up any spills right away.  Avoid walking on wet floors.  Keep items that you use a lot in easy-to-reach places.  If you  need to reach something above you, use a strong step stool that has a grab bar.  Keep electrical cords out of the way.  Do not use floor polish or wax that makes floors slippery. If you must use wax, use non-skid floor wax.  Do not have throw rugs and other things on the floor that can make you trip. What can I do with my stairs?  Do not leave any items on the stairs.  Make sure that there are handrails on both sides of the stairs and use them. Fix handrails that are broken or loose. Make sure that handrails are as long as the stairways.  Check any carpeting to make sure that it is firmly attached to the stairs. Fix any carpet that is loose or worn.  Avoid having throw rugs at the top or bottom of the stairs. If you do have throw rugs, attach them to the floor with carpet tape.  Make sure that you have a light switch at the top of the stairs and the bottom of the  stairs. If you do not have them, ask someone to add them for you. What else can I do to help prevent falls?  Wear shoes that:  Do not have high heels.  Have rubber bottoms.  Are comfortable and fit you well.  Are closed at the toe. Do not wear sandals.  If you use a stepladder:  Make sure that it is fully opened. Do not climb a closed stepladder.  Make sure that both sides of the stepladder are locked into place.  Ask someone to hold it for you, if possible.  Clearly mark and make sure that you can see:  Any grab bars or handrails.  First and last steps.  Where the edge of each step is.  Use tools that help you move around (mobility aids) if they are needed. These include:  Canes.  Walkers.  Scooters.  Crutches.  Turn on the lights when you go into a dark area. Replace any light bulbs as soon as they burn out.  Set up your furniture so you have a clear path. Avoid moving your furniture around.  If any of your floors are uneven, fix them.  If there are any pets around you, be aware of where they are.  Review your medicines with your doctor. Some medicines can make you feel dizzy. This can increase your chance of falling. Ask your doctor what other things that you can do to help prevent falls. This information is not intended to replace advice given to you by your health care provider. Make sure you discuss any questions you have with your health care provider. Document Released: 07/17/2009 Document Revised: 02/26/2016 Document Reviewed: 10/25/2014 Elsevier Interactive Patient Education  2017 [employer].

## 2019-05-22 ENCOUNTER — Encounter: Payer: Self-pay | Admitting: Family Medicine

## 2019-05-22 ENCOUNTER — Ambulatory Visit (INDEPENDENT_AMBULATORY_CARE_PROVIDER_SITE_OTHER): Payer: PPO | Admitting: Family Medicine

## 2019-05-22 DIAGNOSIS — M48061 Spinal stenosis, lumbar region without neurogenic claudication: Secondary | ICD-10-CM

## 2019-05-22 DIAGNOSIS — M79605 Pain in left leg: Secondary | ICD-10-CM | POA: Diagnosis not present

## 2019-05-22 DIAGNOSIS — M79604 Pain in right leg: Secondary | ICD-10-CM | POA: Diagnosis not present

## 2019-05-22 NOTE — Progress Notes (Signed)
   Chief Complaint:  Kirk [name] is a 78 y.o. male who presents for a telephone visit with a chief complaint of weakness.   Assessment/Plan:  Weakness/Back Pain Patient symptoms coming from lower back with lumbar spinal stenosis.  No red flags. Encouraged him to continue with treatment plan including medications as prescribed by sports medicine and physical therapy.  If no improvement will likely need referral to surgery.  He voiced understanding.    Subjective:  HPI:  Weakness Patient has been seeing sports medicine for the last few months for low back pain and lower extremity weakness.  About a week ago was started on Cymbalta 20 mg daily and meloxicam 7.5 mg daily.  He does not think that either these medications have had much impact at this point.  Recently saw PT yesterday but is not sure if that is been helping either.  He feels more weakness in his legs.  Also has some low back pain that radiates into his legs.  No reported bowel or bladder incontinence.  Had MRI done about 3 months ago which showed moderate to severe lumbar spinal canal stenosis.  ROS: Per HPI  PMH: He reports that he has never smoked. He has never used smokeless tobacco. He reports that he does not drink alcohol or use drugs.      Objective/Observations   No results found for this or any previous visit (from the past 72 hour(s)).   Telephone Visit   I connected with Binnie Rail on 05/22/19 at  4:20 PM EDT via telephone and verified that I am speaking with the correct person using two identifiers. I discussed the limitations of evaluation and management by telemedicine and the availability of in person appointments. The patient expressed understanding and agreed to proceed.   Patient location: Home Provider location: [place] participating in the virtual visit: Myself and PAtient  A total of 12 minutes were spent on medical discussion.      Algis Greenhouse. Jerline Pain, MD 05/22/2019  2:23 PM

## 2019-05-24 ENCOUNTER — Ambulatory Visit (INDEPENDENT_AMBULATORY_CARE_PROVIDER_SITE_OTHER): Payer: PPO | Admitting: Physical Therapy

## 2019-05-24 ENCOUNTER — Encounter: Payer: Self-pay | Admitting: Physical Therapy

## 2019-05-24 DIAGNOSIS — M5442 Lumbago with sciatica, left side: Secondary | ICD-10-CM | POA: Diagnosis not present

## 2019-05-24 DIAGNOSIS — G8929 Other chronic pain: Secondary | ICD-10-CM | POA: Diagnosis not present

## 2019-05-24 DIAGNOSIS — M5441 Lumbago with sciatica, right side: Secondary | ICD-10-CM | POA: Diagnosis not present

## 2019-05-24 NOTE — Therapy (Signed)
[facility] [address] [place], [place], [address] Phone: [phone]   Fax:  [phone]  Physical Therapy Evaluation  Patient Details  Name: Kirk [name] MRN: [id number] Date of Birth: 08/31/1941 Referring Provider (PT): [name]   Encounter Date: 05/21/2019  PT End of Session - 05/24/19 1002    Visit Number  1    Number of Visits  12    Date for PT Re-Evaluation  07/02/19    Authorization Type  HTA    PT Start Time  1347    PT Stop Time  1430    PT Time Calculation (min)  43 min    Activity Tolerance  Patient tolerated treatment well    Behavior During Therapy  [facility] for tasks assessed/performed;Flat affect       Past Medical History:  Diagnosis Date  . Cataract   . 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 Cough   . Disturbance of skin sensation   . Diverticulosis of colon 2014  . Diverticulum of esophagus, acquired   . Dysphagia, unspecified(787.20)   . Encounter for long-term (current) use of other medications   . Herpes zoster without mention of complication   . Hyperglycemia   . Hyperlipidemia   . Hypertension   . 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 Lumbago   . Nevus, non-neoplastic   . Osteoarthrosis, unspecified whether generalized or localized, unspecified site    pt. denies  . Other premature beats   . Pain in limb   . Plantar fascial fibromatosis   . Special screening for malignant neoplasm of prostate   . Unspecified glaucoma(365.9)   . Unspecified pruritic disorder   . Unspecified vitamin D deficiency   . Urinary frequency   . Zenker's diverticulum     Past Surgical History:  Procedure Laterality Date  . CATARACT EXTRACTION BILATERAL W/ ANTERIOR VITRECTOMY Bilateral 1977  . COLONOSCOPY  09/06/2013   [name]  . DIRECT LARYNGOSCOPY  10/22/2015   Cervical esophagoscopy. Endoscopic esophageal  diverticulotomy.   Marland Kitchen Lytle  . GLAUCOMA SURGERY  1945   congenital glaucoma  . HERNIA REPAIR  1993  . [place]  2008  . LARYNGOSCOPY     with stapling of zenkers diverticulum    There were no vitals filed for this visit.   Subjective Assessment - 05/24/19 0953    Subjective  Pt was seen previously in PT(for back, balance and gait). He took break from PT to have back looked at and have epidural injections. Pt had 2 lumbar epidural injections, did not feel that it helped much. He reports pain in back and bil LEs/glutes with increased standing/walking. He sometimes feels legs are very weak with initial standing. He also states recently that he has had a few days where he feels very weak and has to stay in bed. Wife states that they are going to get appt with PCP for this, wondering if it is a change with new meds prescribed for back. (pt to see PCP on tomorrow).    Diagnostic tests  Multilevel spondylosis. Spinal stenosis and spinal claudication. 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  Yes    Pain Score  7     Pain Location  Leg    Pain Orientation  Right;Left    Pain Descriptors / Indicators  Aching  Pain Type  Chronic pain    Pain Onset  More than a month ago    Pain Frequency  Intermittent    Aggravating Factors   initial standing, prolonged walking, standing    Pain Relieving Factors  rest         [facility] PT Assessment - 05/24/19 0001      Assessment   Medical Diagnosis  Low Back Pain    Referring Provider (PT)  [name]    Prior Therapy  yes      Precautions   Precautions  Fall      Balance Screen   Has the patient fallen in the past 6 months  Yes    How many times?  1    Has the patient had a decrease in activity level because of a fear of falling?   Yes    Is the patient reluctant to leave their home because of a fear of falling?   No      Prior Function   Level of Independence  Independent      Cognition    Overall Cognitive Status  Within Functional Limits for tasks assessed    Executive Function  Initiating;Self Correcting    Initiating  --   mild deficit   Self Correcting  --   mild deficit      ROM / Strength   AROM / PROM / Strength  AROM;Strength      AROM   Overall AROM Comments  lumbar: All motions : mod/significant limitation;  hips: mild /mod limitation      Strength   Overall Strength Comments  Knee: 4/5, Hips: 4-/5;       Transfers   Comments  Extra time, need for use of 2 hands to assist      Ambulation/Gait   Gait Comments  RW, forward flexed posture, limited endurance due to pain                 Objective measurements completed on examination: See above findings.      [facility] Adult PT Treatment/Exercise - 05/24/19 0001      Lumbar Exercises: Stretches   Passive Hamstring Stretch  Right;Left;3 reps;30 seconds    Single Knee to Chest Stretch  3 reps;30 seconds;Right;Left    Lower Trunk Rotation  5 reps;10 seconds    Piriformis Stretch  Right;Left;2 reps;30 seconds    Piriformis Stretch Limitations  supine, knee to opposite shoulder in figure-4 position      Lumbar Exercises: Seated   Long Arc Quad on Chair  Both;10 reps      Lumbar Exercises: Supine   Bent Knee Raise  20 reps             PT Education - 05/24/19 1002    Education Details  Reviewed new initial HEP    Person(s) Educated  Patient;Spouse    Methods  Explanation;Demonstration    Comprehension  Verbalized understanding;Need further instruction;Returned demonstration;Verbal cues required       PT Short Term Goals - 03/13/19 1602      PT SHORT TERM GOAL #1   Title  Pt to demo ability for sit to stand x1 with no use of UEs.     Time  2    Period  Weeks    Status  Achieved    Target Date  03/07/19      PT SHORT TERM GOAL #2   Title  Pt to be independent with initial HEp  Time  2    Period  Weeks    Status  Partially Met    Target Date  03/07/19        PT Long Term  Goals - 05/24/19 1003      PT LONG TERM GOAL #1   Title  Pt to be indepedent with final HEP for back and LEs    Time  6    Period  Weeks    Status  New    Target Date  07/02/19      PT LONG TERM GOAL #2   Title  Pt to demo improved endurance for standing/walking, for at least 500 ft with pain no greater than 3/10 in legs or back.    Time  6    Period  Weeks    Status  New    Target Date  07/02/19      PT LONG TERM GOAL #3   Title  Pt to reports decreased pain in LEs to 3/10 , to improve ability for ADLS and IADLs.    Time  6    Period  Weeks    Status  New    Target Date  07/02/19      PT LONG TERM GOAL #4   Title  Pt to demo ability for sit to stand without use of UEs, x5, on 1st try.     Time  6    Period  Weeks    Status  New    Target Date  07/02/19             Plan - 05/24/19 1008    Clinical Impression Statement  Pt presents to PT with increased complaints of low back and LE pain. He has decreased lumbar ROM, and increased pain with activity. He has poor endurance for standing and walking, due to pain. He also has decreased LE strength. He has decreased balance and safety with fucntional mobility. Pt using RW at this time for ambulation for back pain and stability. He is a fall risk. Pt did get some relief from previous PT sessions, and would like to continue at this time. He did not get much relief from recent injections, and is trying to avoid surgical intervention.Pt to benefit from skilled PT to improve LE strength, Lumbar mobility, pain, and endurance and safety with ambulation.    Personal Factors and Comorbidities  Age;Comorbidity 1    Comorbidities  + findings on MRI for back    Examination-Activity Limitations  Stairs;Lift;Bend;Stand;Transfers;Locomotion Level;Squat    Examination-Participation Restrictions  Cleaning;Yard Work;Church;Community Activity    Stability/Clinical Decision Making  Evolving/Moderate complexity    Clinical Decision Making  Moderate     Rehab Potential  Good    PT Frequency  2x / week    PT Duration  6 weeks    PT Treatment/Interventions  ADLs/Self Care Home Management;Moist Heat;Traction;Ultrasound;Functional mobility training;Stair training;Gait training;DME Instruction;Therapeutic activities;Therapeutic exercise;Balance training;Neuromuscular re-education;Manual techniques;Patient/family education;Orthotic Fit/Training;Passive range of motion;Energy conservation;Taping;Dry needling;Spinal Manipulations;Joint Manipulations    PT Home Exercise Plan  [email or web address]    Consulted and Agree with Plan of Care  Patient       Patient will benefit from skilled therapeutic intervention in order to improve the following deficits and impairments:  Abnormal gait, Pain, Improper body mechanics, Increased muscle spasms, Hypermobility, Decreased mobility, Decreased activity tolerance, Decreased endurance, Decreased range of motion, Decreased strength, Difficulty walking, Decreased balance, Hypomobility, Impaired flexibility, Decreased knowledge of precautions  Visit Diagnosis: Chronic bilateral low back pain with bilateral  sciatica     Problem List Patient Active Problem List   Diagnosis Date Noted  . 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 Acute right ankle pain 07/19/2018  . TSH elevation 02/25/2016  . Orthostatic hypotension 02/17/2016  . Syncope 01/07/2016  . 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 Bifascicular block 08/20/2015  . Neuropathy ([place]) 02/05/2015  . Diverticulosis of colon   . Vitamin D deficiency   . Hyperglycemia   . Dysphagia   . Diverticulum of esophagus, acquired   . Hyperlipidemia   . Glaucoma   . BPH (benign prostatic hyperplasia)   . Essential hypertension 02/01/2013  . 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    Lyndee Hensen, PT, DPT 12:00 PM  05/24/19    [facility] [place], [place], [address] Phone:  [phone]   Fax:  [phone]  Name: TREVINO WYATT MRN: [id number] Date of Birth: 09-09-1941

## 2019-05-28 ENCOUNTER — Encounter: Payer: Self-pay | Admitting: Physical Therapy

## 2019-05-28 NOTE — Therapy (Signed)
[facility] [address] [place], [place], [address] Phone: [phone]   Fax:  [phone]  Physical Therapy Treatment  Patient Details  Name: Kirk [name] MRN: [id number] Date of Birth: 1940/10/29 Referring Provider (PT): [name]   Encounter Date: 05/24/2019  PT End of Session - 05/28/19 1235    Visit Number  2    Number of Visits  12    Date for PT Re-Evaluation  07/02/19    Authorization Type  HTA    PT Start Time  1404    PT Stop Time  1448    PT Time Calculation (min)  44 min    Activity Tolerance  Patient tolerated treatment well    Behavior During Therapy  [facility] for tasks assessed/performed;Flat affect       Past Medical History:  Diagnosis Date  . Cataract   . Congenital glaucoma    "both eyes operated on when I was 19 months old"  . Cough   . Disturbance of skin sensation   . Diverticulosis of colon 2014  . Diverticulum of esophagus, acquired   . Dysphagia, unspecified(787.20)   . Encounter for long-term (current) use of other medications   . Herpes zoster without mention of complication   . Hyperglycemia   . Hyperlipidemia   . Hypertension   . 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 Lumbago   . Nevus, non-neoplastic   . Osteoarthrosis, unspecified whether generalized or localized, unspecified site    pt. denies  . Other premature beats   . Pain in limb   . Plantar fascial fibromatosis   . Special screening for malignant neoplasm of prostate   . Unspecified glaucoma(365.9)   . Unspecified pruritic disorder   . Unspecified vitamin D deficiency   . Urinary frequency   . Zenker's diverticulum     Past Surgical History:  Procedure Laterality Date  . CATARACT EXTRACTION BILATERAL W/ ANTERIOR VITRECTOMY Bilateral 1977  . COLONOSCOPY  09/06/2013   [name]  . DIRECT LARYNGOSCOPY  10/22/2015   Cervical esophagoscopy. Endoscopic esophageal  diverticulotomy.   Marland Kitchen [place]  . GLAUCOMA SURGERY  1945   congenital glaucoma  . HERNIA REPAIR  1993  . [place]  2008  . LARYNGOSCOPY     with stapling of zenkers diverticulum    There were no vitals filed for this visit.  Subjective Assessment - 05/28/19 1234    Subjective  No new complaints.    Currently in Pain?  Yes    Pain Score  5     Pain Location  Leg    Pain Orientation  Right;Left    Pain Descriptors / Indicators  Aching    Pain Type  Chronic pain    Pain Onset  More than a month ago    Pain Frequency  Intermittent                       [facility] Adult PT Treatment/Exercise - 05/28/19 0001      Ambulation/Gait   Gait Comments  35 ft x4 ,( x2) with SPC, cuing for posture      Lumbar Exercises: Stretches   Passive Hamstring Stretch  Right;Left;3 reps;30 seconds    Passive Hamstring Stretch Limitations  seated Max cuing     Single Knee to Chest Stretch  3 reps;30 seconds;Right;Left    Lower Trunk Rotation  5 reps;10 seconds  Piriformis Stretch  --    Piriformis Stretch Limitations  --      Lumbar Exercises: Aerobic   Stationary Bike  L1 x 5 min      Lumbar Exercises: Seated   Long Arc Quad on Chair  Both;20 reps    Sit to Stand  10 reps      Lumbar Exercises: Supine   Pelvic Tilt Limitations  tried, unable.    Clam  20 reps    Clam Limitations  GTB    Bent Knee Raise  20 reps      Lumbar Exercises: Sidelying   Hip Abduction  10 reps;Both      Manual Therapy   Passive ROM  Long leg distraction x2 min bil for lumbar pump               PT Short Term Goals - 03/13/19 1602      PT SHORT TERM GOAL #1   Title  Pt to demo ability for sit to stand x1 with no use of UEs.     Time  2    Period  Weeks    Status  Achieved    Target Date  03/07/19      PT SHORT TERM GOAL #2   Title  Pt to be independent with initial HEp    Time  2    Period  Weeks    Status  Partially Met    Target Date   03/07/19        PT Long Term Goals - 05/24/19 1003      PT LONG TERM GOAL #1   Title  Pt to be indepedent with final HEP for back and LEs    Time  6    Period  Weeks    Status  New    Target Date  07/02/19      PT LONG TERM GOAL #2   Title  Pt to demo improved endurance for standing/walking, for at least 500 ft with pain no greater than 3/10 in legs or back.    Time  6    Period  Weeks    Status  New    Target Date  07/02/19      PT LONG TERM GOAL #3   Title  Pt to reports decreased pain in LEs to 3/10 , to improve ability for ADLS and IADLs.    Time  6    Period  Weeks    Status  New    Target Date  07/02/19      PT LONG TERM GOAL #4   Title  Pt to demo ability for sit to stand without use of UEs, x5, on 1st try.     Time  6    Period  Weeks    Status  New    Target Date  07/02/19            Plan - 05/28/19 1236    Clinical Impression Statement  Pt requires max cuing for most all exercises, to perform correctly, and to hold for correct amount of time. Gait reviewed with SPC, for safety and mechanics today. Pt with improved posture with use of SPC vs Walker, but pt feels more comfortable with walker. He states he does have fear of falling, and also has poor eyesight, both which contribute to his balance. Plan to progress as able.    Personal Factors and Comorbidities  Age;Comorbidity 1    Comorbidities  + findings on  MRI for back    Examination-Activity Limitations  Stairs;Lift;Bend;Stand;Transfers;Locomotion Level;Squat    Examination-Participation Restrictions  Cleaning;Yard Work;Church;Community Activity    Stability/Clinical Decision Making  Evolving/Moderate complexity    Rehab Potential  Good    PT Frequency  2x / week    PT Duration  6 weeks    PT Treatment/Interventions  ADLs/Self Care Home Management;Moist Heat;Traction;Ultrasound;Functional mobility training;Stair training;Gait training;DME Instruction;Therapeutic activities;Therapeutic exercise;Balance  training;Neuromuscular re-education;Manual techniques;Patient/family education;Orthotic Fit/Training;Passive range of motion;Energy conservation;Taping;Dry needling;Spinal Manipulations;Joint Manipulations    PT Home Exercise Plan  [email or web address]    Consulted and Agree with Plan of Care  Patient       Patient will benefit from skilled therapeutic intervention in order to improve the following deficits and impairments:  Abnormal gait, Pain, Improper body mechanics, Increased muscle spasms, Hypermobility, Decreased mobility, Decreased activity tolerance, Decreased endurance, Decreased range of motion, Decreased strength, Difficulty walking, Decreased balance, Hypomobility, Impaired flexibility, Decreased knowledge of precautions  Visit Diagnosis: Chronic bilateral low back pain with bilateral sciatica     Problem List Patient Active Problem List   Diagnosis Date Noted  . 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 Acute right ankle pain 07/19/2018  . TSH elevation 02/25/2016  . Orthostatic hypotension 02/17/2016  . Syncope 01/07/2016  . 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 Bifascicular block 08/20/2015  . Neuropathy ([place]) 02/05/2015  . Diverticulosis of colon   . Vitamin D deficiency   . Hyperglycemia   . Dysphagia   . Diverticulum of esophagus, acquired   . Hyperlipidemia   . Glaucoma   . BPH (benign prostatic hyperplasia)   . Essential hypertension 02/01/2013  . 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   Lyndee Hensen, PT, DPT 12:38 PM  05/28/19    [facility] [place], [place], [address] Phone: [phone]   Fax:  [phone]  Name: Kirk [name] MRN: [id number] Date of Birth: 09-09-1941

## 2019-05-29 ENCOUNTER — Ambulatory Visit (INDEPENDENT_AMBULATORY_CARE_PROVIDER_SITE_OTHER): Payer: PPO | Admitting: Physical Therapy

## 2019-05-29 ENCOUNTER — Encounter: Payer: Self-pay | Admitting: Physical Therapy

## 2019-05-29 DIAGNOSIS — M5442 Lumbago with sciatica, left side: Secondary | ICD-10-CM

## 2019-05-29 DIAGNOSIS — M79604 Pain in right leg: Secondary | ICD-10-CM

## 2019-05-29 DIAGNOSIS — M5441 Lumbago with sciatica, right side: Secondary | ICD-10-CM

## 2019-05-29 DIAGNOSIS — M79605 Pain in left leg: Secondary | ICD-10-CM | POA: Diagnosis not present

## 2019-05-29 DIAGNOSIS — G8929 Other chronic pain: Secondary | ICD-10-CM | POA: Diagnosis not present

## 2019-05-29 NOTE — Therapy (Signed)
[facility] [address] [place], [place], [address] Phone: [phone]   Fax:  [phone]  Physical Therapy Treatment  Patient Details  Name: Kirk [name] MRN: [id number] Date of Birth: December 15, 1940 Referring Provider (PT): [name]   Encounter Date: 05/29/2019  PT End of Session - 05/29/19 2059    Visit Number  3    Number of Visits  12    Date for PT Re-Evaluation  07/02/19    Authorization Type  HTA    PT Start Time  1348    PT Stop Time  1430    PT Time Calculation (min)  42 min    Activity Tolerance  Patient tolerated treatment well    Behavior During Therapy  [facility] for tasks assessed/performed;Flat affect       Past Medical History:  Diagnosis Date  . Cataract   . Congenital glaucoma    "both eyes operated on when I was 38 months old"  . Cough   . Disturbance of skin sensation   . Diverticulosis of colon 2014  . Diverticulum of esophagus, acquired   . Dysphagia, unspecified(787.20)   . Encounter for long-term (current) use of other medications   . Herpes zoster without mention of complication   . Hyperglycemia   . Hyperlipidemia   . Hypertension   . 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 Lumbago   . Nevus, non-neoplastic   . Osteoarthrosis, unspecified whether generalized or localized, unspecified site    pt. denies  . Other premature beats   . Pain in limb   . Plantar fascial fibromatosis   . Special screening for malignant neoplasm of prostate   . Unspecified glaucoma(365.9)   . Unspecified pruritic disorder   . Unspecified vitamin D deficiency   . Urinary frequency   . Zenker's diverticulum     Past Surgical History:  Procedure Laterality Date  . CATARACT EXTRACTION BILATERAL W/ ANTERIOR VITRECTOMY Bilateral 1977  . COLONOSCOPY  09/06/2013   [name]  . DIRECT LARYNGOSCOPY  10/22/2015   Cervical esophagoscopy. Endoscopic esophageal  diverticulotomy.   Marland Kitchen [place]  . GLAUCOMA SURGERY  1945   congenital glaucoma  . HERNIA REPAIR  1993  . [place]  2008  . LARYNGOSCOPY     with stapling of zenkers diverticulum    There were no vitals filed for this visit.  Subjective Assessment - 05/29/19 2058    Subjective  No new complaints, states "weakness and pulling"  in posterior thigh region.    Patient Stated Goals  decreased pain    Currently in Pain?  Yes    Pain Score  5     Pain Location  Leg    Pain Orientation  Left;Right    Pain Descriptors / Indicators  Pressure;Tightness;Aching    Pain Type  Chronic pain    Pain Onset  More than a month ago    Pain Frequency  Intermittent                       [facility] Adult PT Treatment/Exercise - 05/29/19 0001      Ambulation/Gait   Gait Comments  35 ft x4 , with SPC, cuing for posture      Self-Care   Self-Care  Other Self-Care Comments    Other Self-Care Comments   Max education on safety with all transfers today for improved efficiency, as well  as safe use of  manuvering RW around clinic, and outside on curbs      Lumbar Exercises: Stretches   Passive Hamstring Stretch  Right;Left;3 reps;30 seconds    Passive Hamstring Stretch Limitations  seated Max cuing     Single Knee to Chest Stretch  --    Lower Trunk Rotation  --      Lumbar Exercises: Aerobic   Stationary Bike  L1 x 5 min      Lumbar Exercises: Standing   Other Standing Lumbar Exercises  HIp abd 2x10 bi;  Marching x20;     Other Standing Lumbar Exercises  L/R weight shifts x25;       Lumbar Exercises: Seated   Long Arc Quad on Chair  Both;20 reps    Sit to Stand  10 reps      Lumbar Exercises: Supine   Pelvic Tilt Limitations  --    Clam  --    Clam Limitations  --    Bent Knee Raise  --      Lumbar Exercises: Sidelying   Hip Abduction  --      Manual Therapy   Passive ROM  --               PT Short Term Goals - 03/13/19 1602       PT SHORT TERM GOAL #1   Title  Pt to demo ability for sit to stand x1 with no use of UEs.     Time  2    Period  Weeks    Status  Achieved    Target Date  03/07/19      PT SHORT TERM GOAL #2   Title  Pt to be independent with initial HEp    Time  2    Period  Weeks    Status  Partially Met    Target Date  03/07/19        PT Long Term Goals - 05/24/19 1003      PT LONG TERM GOAL #1   Title  Pt to be indepedent with final HEP for back and LEs    Time  6    Period  Weeks    Status  New    Target Date  07/02/19      PT LONG TERM GOAL #2   Title  Pt to demo improved endurance for standing/walking, for at least 500 ft with pain no greater than 3/10 in legs or back.    Time  6    Period  Weeks    Status  New    Target Date  07/02/19      PT LONG TERM GOAL #3   Title  Pt to reports decreased pain in LEs to 3/10 , to improve ability for ADLS and IADLs.    Time  6    Period  Weeks    Status  New    Target Date  07/02/19      PT LONG TERM GOAL #4   Title  Pt to demo ability for sit to stand without use of UEs, x5, on 1st try.     Time  6    Period  Weeks    Status  New    Target Date  07/02/19            Plan - 05/29/19 2101    Clinical Impression Statement  Pt requires max verbal and tactile cuing for all ther ex, to perform correctly and  to hold for correct amount of time. Pt very fearful of falling today, wtih just L/R weight shifts. Max education done with pt today on safe manuvering with RW. Pt not keeping AD close to him when he is going to sit down on chair, mat table, bike, etc. Also discussed correct/efficient mechanics for sit to stand. Pt with difficulty following directions for ther ex, but pts wife states that he is doing well with HEP at home.    Personal Factors and Comorbidities  Age;Comorbidity 1    Comorbidities  + findings on MRI for back    Examination-Activity Limitations  Stairs;Lift;Bend;Stand;Transfers;Locomotion Level;Squat     Examination-Participation Restrictions  Cleaning;Yard Work;Church;Community Activity    Stability/Clinical Decision Making  Evolving/Moderate complexity    Rehab Potential  Good    PT Frequency  2x / week    PT Duration  6 weeks    PT Treatment/Interventions  ADLs/Self Care Home Management;Moist Heat;Traction;Ultrasound;Functional mobility training;Stair training;Gait training;DME Instruction;Therapeutic activities;Therapeutic exercise;Balance training;Neuromuscular re-education;Manual techniques;Patient/family education;Orthotic Fit/Training;Passive range of motion;Energy conservation;Taping;Dry needling;Spinal Manipulations;Joint Manipulations    PT Home Exercise Plan  [email or web address]    Consulted and Agree with Plan of Care  Patient       Patient will benefit from skilled therapeutic intervention in order to improve the following deficits and impairments:  Abnormal gait, Pain, Improper body mechanics, Increased muscle spasms, Hypermobility, Decreased mobility, Decreased activity tolerance, Decreased endurance, Decreased range of motion, Decreased strength, Difficulty walking, Decreased balance, Hypomobility, Impaired flexibility, Decreased knowledge of precautions  Visit Diagnosis: Chronic bilateral low back pain with bilateral sciatica  Bilateral leg pain     Problem List Patient Active Problem List   Diagnosis Date Noted  . 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 Acute right ankle pain 07/19/2018  . TSH elevation 02/25/2016  . Orthostatic hypotension 02/17/2016  . Syncope 01/07/2016  . 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 Bifascicular block 08/20/2015  . Neuropathy ([place]) 02/05/2015  . Diverticulosis of colon   . Vitamin D deficiency   . Hyperglycemia   . Dysphagia   . Diverticulum of esophagus, acquired   . Hyperlipidemia   . Glaucoma   . BPH (benign prostatic hyperplasia)   . Essential hypertension 02/01/2013  . Impotence sexual  01/30/2013   Lyndee Hensen, PT, DPT 9:05 PM  05/29/19    [facility] [place], [place], [address] Phone: [phone]   Fax:  [phone]  Name: Kirk [name] MRN: [id number] Date of Birth: [birth year]

## 2019-05-31 ENCOUNTER — Encounter: Payer: Self-pay | Admitting: Physical Therapy

## 2019-05-31 ENCOUNTER — Ambulatory Visit (INDEPENDENT_AMBULATORY_CARE_PROVIDER_SITE_OTHER): Payer: PPO | Admitting: Physical Therapy

## 2019-05-31 DIAGNOSIS — M5442 Lumbago with sciatica, left side: Secondary | ICD-10-CM

## 2019-05-31 DIAGNOSIS — M79605 Pain in left leg: Secondary | ICD-10-CM | POA: Diagnosis not present

## 2019-05-31 DIAGNOSIS — M5441 Lumbago with sciatica, right side: Secondary | ICD-10-CM

## 2019-05-31 DIAGNOSIS — M79604 Pain in right leg: Secondary | ICD-10-CM

## 2019-05-31 DIAGNOSIS — G8929 Other chronic pain: Secondary | ICD-10-CM

## 2019-05-31 NOTE — Therapy (Signed)
[facility] [address] [place], [place], [address] Phone: [phone]   Fax:  [phone]  Physical Therapy Treatment  Patient Details  Name: Kirk [name] MRN: [id number] Date of Birth: 09-09-1941 Referring Provider (PT): [name]   Encounter Date: 05/31/2019  PT End of Session - 05/31/19 1356    Visit Number  4    Number of Visits  12    Date for PT Re-Evaluation  07/02/19    Authorization Type  HTA    PT Start Time  1346    PT Stop Time  1432    PT Time Calculation (min)  46 min    Activity Tolerance  Patient tolerated treatment well    Behavior During Therapy  [facility] for tasks assessed/performed;Flat affect       Past Medical History:  Diagnosis Date  . Cataract   . Congenital glaucoma    "both eyes operated on when I was 53 months old"  . Cough   . Disturbance of skin sensation   . Diverticulosis of colon 2014  . Diverticulum of esophagus, acquired   . Dysphagia, unspecified(787.20)   . Encounter for long-term (current) use of other medications   . Herpes zoster without mention of complication   . Hyperglycemia   . Hyperlipidemia   . Hypertension   . 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 Lumbago   . Nevus, non-neoplastic   . Osteoarthrosis, unspecified whether generalized or localized, unspecified site    pt. denies  . Other premature beats   . Pain in limb   . Plantar fascial fibromatosis   . Special screening for malignant neoplasm of prostate   . Unspecified glaucoma(365.9)   . Unspecified pruritic disorder   . Unspecified vitamin D deficiency   . Urinary frequency   . Zenker's diverticulum     Past Surgical History:  Procedure Laterality Date  . CATARACT EXTRACTION BILATERAL W/ ANTERIOR VITRECTOMY Bilateral 1977  . COLONOSCOPY  09/06/2013   [name]  . DIRECT LARYNGOSCOPY  10/22/2015   Cervical esophagoscopy. Endoscopic esophageal  diverticulotomy.   Marland Kitchen [place]  . GLAUCOMA SURGERY  1945   congenital glaucoma  . HERNIA REPAIR  1993  . [place]  2008  . LARYNGOSCOPY     with stapling of zenkers diverticulum    There were no vitals filed for this visit.  Subjective Assessment - 05/31/19 1355    Subjective  No new complaints    Currently in Pain?  Yes    Pain Score  5     Pain Location  Leg    Pain Orientation  Right;Left    Pain Descriptors / Indicators  Pressure;Tightness;Aching    Pain Type  Chronic pain    Pain Onset  More than a month ago    Pain Frequency  Intermittent         [facility] PT Assessment - 05/31/19 0001      Ambulation/Gait   Gait Comments  35 ft x4 , with SPC, cuing for posture                   [facility] Adult PT Treatment/Exercise - 05/31/19 1357      Ambulation/Gait   Gait Comments  35 ft x4 , with SPC, cuing for posture,  70 ft x2 with RW;       Lumbar Exercises: Stretches   Single Knee to Chest Stretch  3 reps;30 seconds;Right;Left      Lumbar Exercises: Aerobic   Stationary Bike  L1 x 5 min      Lumbar Exercises: Seated   Long Arc Quad on Chair  Both;20 reps    Sit to Stand  10 reps      Lumbar Exercises: Supine   Ab Set  10 reps;3 seconds    Clam  20 reps    Clam Limitations  GTB    Bent Knee Raise  20 reps      Lumbar Exercises: Sidelying   Hip Abduction  10 reps;Both      Manual Therapy   Passive ROM  Long leg distraction x2 min bil for lumbar pump;  Manual Hs stretching,                PT Short Term Goals - 03/13/19 1602      PT SHORT TERM GOAL #1   Title  Pt to demo ability for sit to stand x1 with no use of UEs.     Time  2    Period  Weeks    Status  Achieved    Target Date  03/07/19      PT SHORT TERM GOAL #2   Title  Pt to be independent with initial HEp    Time  2    Period  Weeks    Status  Partially Met    Target Date  03/07/19        PT Long Term Goals - 05/24/19 1003      PT LONG  TERM GOAL #1   Title  Pt to be indepedent with final HEP for back and LEs    Time  6    Period  Weeks    Status  New    Target Date  07/02/19      PT LONG TERM GOAL #2   Title  Pt to demo improved endurance for standing/walking, for at least 500 ft with pain no greater than 3/10 in legs or back.    Time  6    Period  Weeks    Status  New    Target Date  07/02/19      PT LONG TERM GOAL #3   Title  Pt to reports decreased pain in LEs to 3/10 , to improve ability for ADLS and IADLs.    Time  6    Period  Weeks    Status  New    Target Date  07/02/19      PT LONG TERM GOAL #4   Title  Pt to demo ability for sit to stand without use of UEs, x5, on 1st try.     Time  6    Period  Weeks    Status  New    Target Date  07/02/19            Plan - 05/31/19 2136    Clinical Impression Statement  Pt with minimal pain during ther ex today. He has improved ability and mechanics for sit to stand today. His legs tire easily with standing activity. Pt to benefit from progression of standing activity and ambulation, as well as ther ex and manual for decreased back pain.    Personal Factors and Comorbidities  Age;Comorbidity 1    Comorbidities  + findings on MRI for back    Examination-Activity Limitations  Stairs;Lift;Bend;Stand;Transfers;Locomotion Level;Squat    Examination-Participation Restrictions  Cleaning;Yard Work;Church;Community Activity    Stability/Clinical Decision Making  Evolving/Moderate complexity    Rehab Potential  Good    PT Frequency  2x / week    PT Duration  6 weeks    PT Treatment/Interventions  ADLs/Self Care Home Management;Moist Heat;Traction;Ultrasound;Functional mobility training;Stair training;Gait training;DME Instruction;Therapeutic activities;Therapeutic exercise;Balance training;Neuromuscular re-education;Manual techniques;Patient/family education;Orthotic Fit/Training;Passive range of motion;Energy conservation;Taping;Dry needling;Spinal  Manipulations;Joint Manipulations    PT Home Exercise Plan  [email or web address]    Consulted and Agree with Plan of Care  Patient       Patient will benefit from skilled therapeutic intervention in order to improve the following deficits and impairments:  Abnormal gait, Pain, Improper body mechanics, Increased muscle spasms, Hypermobility, Decreased mobility, Decreased activity tolerance, Decreased endurance, Decreased range of motion, Decreased strength, Difficulty walking, Decreased balance, Hypomobility, Impaired flexibility, Decreased knowledge of precautions  Visit Diagnosis: Chronic bilateral low back pain with bilateral sciatica  Bilateral leg pain     Problem List Patient Active Problem List   Diagnosis Date Noted  . 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 Acute right ankle pain 07/19/2018  . TSH elevation 02/25/2016  . Orthostatic hypotension 02/17/2016  . Syncope 01/07/2016  . 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 Bifascicular block 08/20/2015  . Neuropathy ([place]) 02/05/2015  . Diverticulosis of colon   . Vitamin D deficiency   . Hyperglycemia   . Dysphagia   . Diverticulum of esophagus, acquired   . Hyperlipidemia   . Glaucoma   . BPH (benign prostatic hyperplasia)   . Essential hypertension 02/01/2013  . 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   Lyndee Hensen, PT, DPT 9:40 PM  05/31/19    [facility] [place], [place], [address] Phone: [phone]   Fax:  [phone]  Name: DENZAL MEIR MRN: [id number] Date of Birth: May 02, 1941

## 2019-06-05 ENCOUNTER — Ambulatory Visit (INDEPENDENT_AMBULATORY_CARE_PROVIDER_SITE_OTHER): Payer: PPO | Admitting: Physical Therapy

## 2019-06-05 DIAGNOSIS — M5441 Lumbago with sciatica, right side: Secondary | ICD-10-CM

## 2019-06-05 DIAGNOSIS — M79605 Pain in left leg: Secondary | ICD-10-CM

## 2019-06-05 DIAGNOSIS — G8929 Other chronic pain: Secondary | ICD-10-CM

## 2019-06-05 DIAGNOSIS — M5442 Lumbago with sciatica, left side: Secondary | ICD-10-CM

## 2019-06-05 DIAGNOSIS — M79604 Pain in right leg: Secondary | ICD-10-CM

## 2019-06-06 ENCOUNTER — Other Ambulatory Visit: Payer: Self-pay | Admitting: Family Medicine

## 2019-06-07 ENCOUNTER — Encounter: Payer: Self-pay | Admitting: Physical Therapy

## 2019-06-07 ENCOUNTER — Ambulatory Visit (INDEPENDENT_AMBULATORY_CARE_PROVIDER_SITE_OTHER): Payer: PPO | Admitting: Physical Therapy

## 2019-06-07 DIAGNOSIS — M79604 Pain in right leg: Secondary | ICD-10-CM | POA: Diagnosis not present

## 2019-06-07 DIAGNOSIS — M5441 Lumbago with sciatica, right side: Secondary | ICD-10-CM

## 2019-06-07 DIAGNOSIS — M79605 Pain in left leg: Secondary | ICD-10-CM

## 2019-06-07 DIAGNOSIS — M5442 Lumbago with sciatica, left side: Secondary | ICD-10-CM | POA: Diagnosis not present

## 2019-06-07 DIAGNOSIS — G8929 Other chronic pain: Secondary | ICD-10-CM

## 2019-06-07 NOTE — Therapy (Signed)
[facility] [address] [place], [place], [address] Phone: [phone]   Fax:  [phone]  Physical Therapy Treatment  Patient Details  Name: Kirk [name] MRN: [id number] Date of Birth: 07/03/1941 Referring Provider (PT): [name]   Encounter Date: 06/05/2019  PT End of Session - 06/07/19 1151    Visit Number  5    Number of Visits  12    Date for PT Re-Evaluation  07/02/19    Authorization Type  HTA    PT Start Time  1430    PT Stop Time  6237    PT Time Calculation (min)  44 min    Activity Tolerance  Patient tolerated treatment well    Behavior During Therapy  [facility] for tasks assessed/performed;Flat affect       Past Medical History:  Diagnosis Date  . Cataract   . Congenital glaucoma    "both eyes operated on when I was 72 months old"  . Cough   . Disturbance of skin sensation   . Diverticulosis of colon 2014  . Diverticulum of esophagus, acquired   . Dysphagia, unspecified(787.20)   . Encounter for long-term (current) use of other medications   . Herpes zoster without mention of complication   . Hyperglycemia   . Hyperlipidemia   . Hypertension   . 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 Lumbago   . Nevus, non-neoplastic   . Osteoarthrosis, unspecified whether generalized or localized, unspecified site    pt. denies  . Other premature beats   . Pain in limb   . Plantar fascial fibromatosis   . Special screening for malignant neoplasm of prostate   . Unspecified glaucoma(365.9)   . Unspecified pruritic disorder   . Unspecified vitamin D deficiency   . Urinary frequency   . Zenker's diverticulum     Past Surgical History:  Procedure Laterality Date  . CATARACT EXTRACTION BILATERAL W/ ANTERIOR VITRECTOMY Bilateral 1977  . COLONOSCOPY  09/06/2013   [name]  . DIRECT LARYNGOSCOPY  10/22/2015   Cervical esophagoscopy. Endoscopic esophageal  diverticulotomy.   Marland Kitchen [place]  . GLAUCOMA SURGERY  1945   congenital glaucoma  . HERNIA REPAIR  1993  . [place]  2008  . LARYNGOSCOPY     with stapling of zenkers diverticulum    There were no vitals filed for this visit.  Subjective Assessment - 06/07/19 1150    Subjective  Pt states minimal change in back/LE pain. He has most pain with initial standing, legs feel weak, and also with prolonged walkin.    Currently in Pain?  Yes    Pain Score  5     Pain Location  Leg    Pain Orientation  Left;Right    Pain Descriptors / Indicators  Aching    Pain Onset  More than a month ago    Pain Frequency  Intermittent                       [facility] Adult PT Treatment/Exercise - 06/07/19 0001      Ambulation/Gait   Gait Comments  140 ft with SPC-pt requests to rest due to leg weakness,  then 236f with RW        Lumbar Exercises: Stretches   Single Knee to Chest Stretch  3 reps;30 seconds;Right;Left    Pelvic Tilt  20 reps    Piriformis Stretch  2 reps;30 seconds    Piriformis Stretch Limitations  supine      Lumbar Exercises: Standing   Other Standing Lumbar Exercises  HIp abd 2x10 bi;  Marching x20;       Lumbar Exercises: Seated   Sit to Stand  5 reps    Sit to Stand Limitations  with minor/mod use of Bil UEs.       Lumbar Exercises: Supine   Clam  20 reps    Clam Limitations  GTB    Bent Knee Raise  20 reps      Lumbar Exercises: Sidelying   Hip Abduction  10 reps;Both      Manual Therapy   Passive ROM  Long leg distraction x2 min bil for lumbar pump;  Manual Hs stretching,                PT Short Term Goals - 03/13/19 1602      PT SHORT TERM GOAL #1   Title  Pt to demo ability for sit to stand x1 with no use of UEs.     Time  2    Period  Weeks    Status  Achieved    Target Date  03/07/19      PT SHORT TERM GOAL #2   Title  Pt to be independent with initial HEp    Time  2    Period  Weeks    Status   Partially Met    Target Date  03/07/19        PT Long Term Goals - 05/24/19 1003      PT LONG TERM GOAL #1   Title  Pt to be indepedent with final HEP for back and LEs    Time  6    Period  Weeks    Status  New    Target Date  07/02/19      PT LONG TERM GOAL #2   Title  Pt to demo improved endurance for standing/walking, for at least 500 ft with pain no greater than 3/10 in legs or back.    Time  6    Period  Weeks    Status  New    Target Date  07/02/19      PT LONG TERM GOAL #3   Title  Pt to reports decreased pain in LEs to 3/10 , to improve ability for ADLS and IADLs.    Time  6    Period  Weeks    Status  New    Target Date  07/02/19      PT LONG TERM GOAL #4   Title  Pt to demo ability for sit to stand without use of UEs, x5, on 1st try.     Time  6    Period  Weeks    Status  New    Target Date  07/02/19            Plan - 06/07/19 1152    Clinical Impression Statement  Pt with low tolerance for walking with SPC, only able to do 153f before needing seated rest break. Endurance slightly better with RW, but still gets increased pain in B LEs with increased standing/walking. Recommended increased use of RW at home, for practice walking as needed. Pt states feeling of LE weakness as he does more standing/walking. Pt requires max safety cuing for manuvering with SPC and RW. Pt with improving ability for ther ex, no pain with strengthening, just difficulty due  to weakness.    Personal Factors and Comorbidities  Age;Comorbidity 1    Comorbidities  + findings on MRI for back    Examination-Activity Limitations  Stairs;Lift;Bend;Stand;Transfers;Locomotion Level;Squat    Examination-Participation Restrictions  Cleaning;Yard Work;Church;Community Activity    Stability/Clinical Decision Making  Evolving/Moderate complexity    Rehab Potential  Good    PT Frequency  2x / week    PT Duration  6 weeks    PT Treatment/Interventions  ADLs/Self Care Home Management;Moist  Heat;Traction;Ultrasound;Functional mobility training;Stair training;Gait training;DME Instruction;Therapeutic activities;Therapeutic exercise;Balance training;Neuromuscular re-education;Manual techniques;Patient/family education;Orthotic Fit/Training;Passive range of motion;Energy conservation;Taping;Dry needling;Spinal Manipulations;Joint Manipulations    PT Home Exercise Plan  [email or web address]    Consulted and Agree with Plan of Care  Patient       Patient will benefit from skilled therapeutic intervention in order to improve the following deficits and impairments:  Abnormal gait, Pain, Improper body mechanics, Increased muscle spasms, Hypermobility, Decreased mobility, Decreased activity tolerance, Decreased endurance, Decreased range of motion, Decreased strength, Difficulty walking, Decreased balance, Hypomobility, Impaired flexibility, Decreased knowledge of precautions  Visit Diagnosis: Chronic bilateral low back pain with bilateral sciatica  Bilateral leg pain     Problem List Patient Active Problem List   Diagnosis Date Noted  . 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 Acute right ankle pain 07/19/2018  . TSH elevation 02/25/2016  . Orthostatic hypotension 02/17/2016  . Syncope 01/07/2016  . 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 Bifascicular block 08/20/2015  . Neuropathy ([place]) 02/05/2015  . Diverticulosis of colon   . Vitamin D deficiency   . Hyperglycemia   . Dysphagia   . Diverticulum of esophagus, acquired   . Hyperlipidemia   . Glaucoma   . BPH (benign prostatic hyperplasia)   . Essential hypertension 02/01/2013  . 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   Lyndee Hensen, PT, DPT 11:55 AM  06/07/19    [facility] [place], [place], [address] Phone: [phone]   Fax:  [phone]  Name: Kirk [name] MRN: [id number] Date of Birth: 13-Dec-1940

## 2019-06-12 ENCOUNTER — Encounter: Payer: Self-pay | Admitting: Physical Therapy

## 2019-06-12 ENCOUNTER — Encounter: Payer: PPO | Admitting: Physical Therapy

## 2019-06-12 NOTE — Therapy (Signed)
[facility] [address] [place], [place], [address] Phone: [phone]   Fax:  [phone]  Physical Therapy Treatment  Patient Details  Name: Kirk [name] MRN: [id number] Date of Birth: 23-Jul-1941 Referring Provider (PT): [name]   Encounter Date: 06/07/2019  PT End of Session - 06/12/19 1001    Visit Number  6    Number of Visits  12    Date for PT Re-Evaluation  07/02/19    Authorization Type  HTA    PT Start Time  1432    PT Stop Time  1515    PT Time Calculation (min)  43 min    Activity Tolerance  Patient tolerated treatment well    Behavior During Therapy  [facility] for tasks assessed/performed;Flat affect       Past Medical History:  Diagnosis Date  . Cataract   . Congenital glaucoma    "both eyes operated on when I was 36 months old"  . Cough   . Disturbance of skin sensation   . Diverticulosis of colon 2014  . Diverticulum of esophagus, acquired   . Dysphagia, unspecified(787.20)   . Encounter for long-term (current) use of other medications   . Herpes zoster without mention of complication   . Hyperglycemia   . Hyperlipidemia   . Hypertension   . 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 Lumbago   . Nevus, non-neoplastic   . Osteoarthrosis, unspecified whether generalized or localized, unspecified site    pt. denies  . Other premature beats   . Pain in limb   . Plantar fascial fibromatosis   . Special screening for malignant neoplasm of prostate   . Unspecified glaucoma(365.9)   . Unspecified pruritic disorder   . Unspecified vitamin D deficiency   . Urinary frequency   . Zenker's diverticulum     Past Surgical History:  Procedure Laterality Date  . CATARACT EXTRACTION BILATERAL W/ ANTERIOR VITRECTOMY Bilateral 1977  . COLONOSCOPY  09/06/2013   [name]  . DIRECT LARYNGOSCOPY  10/22/2015   Cervical esophagoscopy. Endoscopic esophageal  diverticulotomy.   Marland Kitchen [place]  . GLAUCOMA SURGERY  1945   congenital glaucoma  . HERNIA REPAIR  1993  . [place]  2008  . LARYNGOSCOPY     with stapling of zenkers diverticulum    There were no vitals filed for this visit.  Subjective Assessment - 06/12/19 0958    Subjective  Pt states doing HEP. He continues to state discomfort, pain, weakness sensation in LEs.    Currently in Pain?  Yes    Pain Score  5     Pain Location  Leg    Pain Orientation  Right;Left    Pain Descriptors / Indicators  Aching    Pain Type  Chronic pain    Pain Onset  More than a month ago    Pain Frequency  Intermittent                       [facility] Adult PT Treatment/Exercise - 06/12/19 0001      Ambulation/Gait   Gait Comments  210 ft with RW       Lumbar Exercises: Stretches   Single Knee to Chest Stretch  3 reps;30 seconds;Right;Left    Pelvic Tilt  20 reps      Lumbar Exercises: Aerobic   Stationary Bike  L1 x 8 min;  Lumbar Exercises: Standing   Other Standing Lumbar Exercises  HIp abd 2x10 2lb, bil;  Marching x20;       Lumbar Exercises: Seated   Sit to Stand  5 reps    Sit to Stand Limitations  with minor/mod use of Bil UEs.       Lumbar Exercises: Sidelying   Hip Abduction  10 reps;Both             PT Education - 06/12/19 1001    Education Details  HEP reviewed    Person(s) Educated  Patient    Methods  Explanation;Demonstration;Tactile cues;Verbal cues    Comprehension  Verbalized understanding;Returned demonstration;Verbal cues required;Tactile cues required;Need further instruction       PT Short Term Goals - 03/13/19 1602      PT SHORT TERM GOAL #1   Title  Pt to demo ability for sit to stand x1 with no use of UEs.     Time  2    Period  Weeks    Status  Achieved    Target Date  03/07/19      PT SHORT TERM GOAL #2   Title  Pt to be independent with initial HEp    Time  2    Period  Weeks    Status   Partially Met    Target Date  03/07/19        PT Long Term Goals - 05/24/19 1003      PT LONG TERM GOAL #1   Title  Pt to be indepedent with final HEP for back and LEs    Time  6    Period  Weeks    Status  New    Target Date  07/02/19      PT LONG TERM GOAL #2   Title  Pt to demo improved endurance for standing/walking, for at least 500 ft with pain no greater than 3/10 in legs or back.    Time  6    Period  Weeks    Status  New    Target Date  07/02/19      PT LONG TERM GOAL #3   Title  Pt to reports decreased pain in LEs to 3/10 , to improve ability for ADLS and IADLs.    Time  6    Period  Weeks    Status  New    Target Date  07/02/19      PT LONG TERM GOAL #4   Title  Pt to demo ability for sit to stand without use of UEs, x5, on 1st try.     Time  6    Period  Weeks    Status  New    Target Date  07/02/19            Plan - 06/12/19 1002    Clinical Impression Statement  Focus on LE and core strength as well as stretching in attempts for decreased pain in LEs and low back. Pt showing some strength improvments in LEs, but continues to have very low tolerance for standing/walking activity, due to "weakness" and pain feelings in glutes/posterior thighs, likley stemming from low back. Plan to assess need to continue PT at next visit with pt and wife. Difficult to assess pts progress subjectively from his reports.    Personal Factors and Comorbidities  Age;Comorbidity 1    Comorbidities  + findings on MRI for back    Examination-Activity Limitations  Stairs;Lift;Bend;Stand;Transfers;Locomotion Level;Squat    Examination-Participation  Restrictions  Cleaning;Yard Work;Church;Community Activity    Stability/Clinical Decision Making  Evolving/Moderate complexity    Rehab Potential  Good    PT Frequency  2x / week    PT Duration  6 weeks    PT Treatment/Interventions  ADLs/Self Care Home Management;Moist Heat;Traction;Ultrasound;Functional mobility training;Stair  training;Gait training;DME Instruction;Therapeutic activities;Therapeutic exercise;Balance training;Neuromuscular re-education;Manual techniques;Patient/family education;Orthotic Fit/Training;Passive range of motion;Energy conservation;Taping;Dry needling;Spinal Manipulations;Joint Manipulations    PT Home Exercise Plan  [email or web address]    Consulted and Agree with Plan of Care  Patient       Patient will benefit from skilled therapeutic intervention in order to improve the following deficits and impairments:  Abnormal gait, Pain, Improper body mechanics, Increased muscle spasms, Hypermobility, Decreased mobility, Decreased activity tolerance, Decreased endurance, Decreased range of motion, Decreased strength, Difficulty walking, Decreased balance, Hypomobility, Impaired flexibility, Decreased knowledge of precautions  Visit Diagnosis: Chronic bilateral low back pain with bilateral sciatica  Bilateral leg pain     Problem List Patient Active Problem List   Diagnosis Date Noted  . 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 Acute right ankle pain 07/19/2018  . TSH elevation 02/25/2016  . Orthostatic hypotension 02/17/2016  . Syncope 01/07/2016  . 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 Bifascicular block 08/20/2015  . Neuropathy ([place]) 02/05/2015  . Diverticulosis of colon   . Vitamin D deficiency   . Hyperglycemia   . Dysphagia   . Diverticulum of esophagus, acquired   . Hyperlipidemia   . Glaucoma   . BPH (benign prostatic hyperplasia)   . Essential hypertension 02/01/2013  . 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    Lyndee Hensen, PT, DPT 10:11 AM  06/12/19    [facility] [place], [place], [address] Phone: [phone]   Fax:  [phone]  Name: GATLIN KITTELL MRN: [id number] Date of Birth: Sep 01, 1941

## 2019-06-14 ENCOUNTER — Encounter: Payer: Self-pay | Admitting: Physical Therapy

## 2019-06-14 ENCOUNTER — Ambulatory Visit (INDEPENDENT_AMBULATORY_CARE_PROVIDER_SITE_OTHER): Payer: PPO | Admitting: Physical Therapy

## 2019-06-14 DIAGNOSIS — M79605 Pain in left leg: Secondary | ICD-10-CM | POA: Diagnosis not present

## 2019-06-14 DIAGNOSIS — M5441 Lumbago with sciatica, right side: Secondary | ICD-10-CM

## 2019-06-14 DIAGNOSIS — M79604 Pain in right leg: Secondary | ICD-10-CM | POA: Diagnosis not present

## 2019-06-14 DIAGNOSIS — G8929 Other chronic pain: Secondary | ICD-10-CM

## 2019-06-14 DIAGNOSIS — M5442 Lumbago with sciatica, left side: Secondary | ICD-10-CM

## 2019-06-14 NOTE — Therapy (Addendum)
[facility] [address] [place], [place], [address] Phone: [phone]   Fax:  [phone]  Physical Therapy Treatment  Patient Details  Name: Kirk [name] MRN: [id number] Date of Birth: 05-24-1941 Referring Provider (PT): [name]   Encounter Date: 06/14/2019  PT End of Session - 06/14/19 1529    Visit Number  7    Number of Visits  12    Date for PT Re-Evaluation  07/02/19    Authorization Type  HTA    PT Start Time  1430    PT Stop Time  1516    PT Time Calculation (min)  46 min    Activity Tolerance  Patient tolerated treatment well    Behavior During Therapy  [facility] for tasks assessed/performed;Flat affect       Past Medical History:  Diagnosis Date  . Cataract   . Congenital glaucoma    "both eyes operated on when I was 82 months old"  . Cough   . Disturbance of skin sensation   . Diverticulosis of colon 2014  . Diverticulum of esophagus, acquired   . Dysphagia, unspecified(787.20)   . Encounter for long-term (current) use of other medications   . Herpes zoster without mention of complication   . Hyperglycemia   . Hyperlipidemia   . Hypertension   . 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 Lumbago   . Nevus, non-neoplastic   . Osteoarthrosis, unspecified whether generalized or localized, unspecified site    pt. denies  . Other premature beats   . Pain in limb   . Plantar fascial fibromatosis   . Special screening for malignant neoplasm of prostate   . Unspecified glaucoma(365.9)   . Unspecified pruritic disorder   . Unspecified vitamin D deficiency   . Urinary frequency   . Zenker's diverticulum     Past Surgical History:  Procedure Laterality Date  . CATARACT EXTRACTION BILATERAL W/ ANTERIOR VITRECTOMY Bilateral 1977  . COLONOSCOPY  09/06/2013   [name]  . DIRECT LARYNGOSCOPY  10/22/2015   Cervical esophagoscopy. Endoscopic esophageal  diverticulotomy.   Marland Kitchen [place]  . GLAUCOMA SURGERY  1945   congenital glaucoma  . HERNIA REPAIR  1993  . [place]  2008  . LARYNGOSCOPY     with stapling of zenkers diverticulum    There were no vitals filed for this visit.  Subjective Assessment - 06/14/19 1527    Subjective  Pt and wife state that pt has not noticed significant difference in back/LE pain. Pt going away next week, and then has f/u with MD    Limitations  Standing;Walking;House hold activities    Patient Stated Goals  decreased pain    Currently in Pain?  Yes    Pain Score  5     Pain Location  Leg    Pain Orientation  Right;Left    Pain Descriptors / Indicators  Aching    Pain Type  Chronic pain    Pain Onset  More than a month ago    Pain Frequency  Intermittent    Aggravating Factors   initial standing, prolonged walking.                       [facility] Adult PT Treatment/Exercise - 06/14/19 1439      Ambulation/Gait   Gait Comments  210 ft with RW , pt requirest rest at this time.  Lumbar Exercises: Stretches   Single Knee to Chest Stretch  3 reps;30 seconds;Right;Left    Pelvic Tilt  --      Lumbar Exercises: Aerobic   Stationary Bike  L1 x 5  min; pt requests to stop at 5.       Lumbar Exercises: Standing   Other Standing Lumbar Exercises  --    Other Standing Lumbar Exercises  mini squats at RW x15;       Lumbar Exercises: Seated   Long Arc Quad on Chair  20 reps    Sit to Stand  5 reps    Sit to Stand Limitations  with minor/mod use of Bil UEs.       Lumbar Exercises: Supine   Clam  20 reps    Bent Knee Raise  20 reps      Lumbar Exercises: Sidelying   Hip Abduction  10 reps;Both             PT Education - 06/14/19 1528    Education Details  Reviewed final HEP in detail    Person(s) Educated  Patient;Spouse    Methods  Explanation    Comprehension  Verbalized understanding;Returned demonstration;Verbal cues required;Tactile  cues required       PT Short Term Goals - 06/14/19 1529      PT SHORT TERM GOAL #1   Title  Pt to demo ability for sit to stand x1 with no use of UEs.     Time  2    Period  Weeks    Status  Achieved    Target Date  03/07/19      PT SHORT TERM GOAL #2   Title  Pt to be independent with initial HEp    Time  2    Period  Weeks    Status  Achieved    Target Date  03/07/19        PT Long Term Goals - 06/14/19 1529      PT LONG TERM GOAL #1   Title  Pt to be indepedent with final HEP for back and LEs    Baseline  Still requires cuing today to hold 30 sec.    Time  6    Period  Weeks    Status  Partially Met      PT LONG TERM GOAL #2   Title  Pt to demo improved endurance for standing/walking, for at least 500 ft with pain no greater than 3/10 in legs or back.    Baseline  Met, with use of RW.    Time  6    Period  Weeks    Status  Partially Met      PT LONG TERM GOAL #3   Title  Pt to reports decreased pain in LEs to 3/10 , to improve ability for ADLS and IADLs.    Time  6    Period  Weeks    Status  On-going      PT LONG TERM GOAL #4   Title  Pt to demo ability for sit to stand without use of UEs, x5, on 1st try.     Time  6    Period  Weeks    Status  Achieved            Plan - 06/14/19 1531    Clinical Impression Statement  Will hold PT at this time. Pt has f/u with MD next week. He is showing mild improvments in strength for  LEs, but continues to have significant pain in back/LEs that is limiting for him. He has increased pain/weakness feeling in thighs with initial standing, and prolonged walking, more than 220f. RW helps with endurance and pain. Pt has been educated on HEP for back, and home safety in great detail. He continues to require cuing for holding stretches, and mechanics with exercises. Pt will be referred back to MD to futher interventions for back pain. Pt and wife in agreement with plan.    Personal Factors and Comorbidities  Age;Comorbidity  1    Comorbidities  + findings on MRI for back    Examination-Activity Limitations  Stairs;Lift;Bend;Stand;Transfers;Locomotion Level;Squat    Examination-Participation Restrictions  Cleaning;Yard Work;Church;Community Activity    Stability/Clinical Decision Making  Evolving/Moderate complexity    Rehab Potential  Good    PT Frequency  2x / week    PT Duration  6 weeks    PT Treatment/Interventions  ADLs/Self Care Home Management;Moist Heat;Traction;Ultrasound;Functional mobility training;Stair training;Gait training;DME Instruction;Therapeutic activities;Therapeutic exercise;Balance training;Neuromuscular re-education;Manual techniques;Patient/family education;Orthotic Fit/Training;Passive range of motion;Energy conservation;Taping;Dry needling;Spinal Manipulations;Joint Manipulations    PT Home Exercise Plan  [email or web address]    Consulted and Agree with Plan of Care  Patient       Patient will benefit from skilled therapeutic intervention in order to improve the following deficits and impairments:  Abnormal gait, Pain, Improper body mechanics, Increased muscle spasms, Hypermobility, Decreased mobility, Decreased activity tolerance, Decreased endurance, Decreased range of motion, Decreased strength, Difficulty walking, Decreased balance, Hypomobility, Impaired flexibility, Decreased knowledge of precautions  Visit Diagnosis: Chronic bilateral low back pain with bilateral sciatica  Bilateral leg pain     Problem List Patient Active Problem List   Diagnosis Date Noted  . 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 Acute right ankle pain 07/19/2018  . TSH elevation 02/25/2016  . Orthostatic hypotension 02/17/2016  . Syncope 01/07/2016  . 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 Bifascicular block 08/20/2015  . Neuropathy ([place] 02/05/2015  . Diverticulosis of colon   . Vitamin D deficiency   . Hyperglycemia   . Dysphagia   . Diverticulum of esophagus,  acquired   . Hyperlipidemia   . Glaucoma   . BPH (benign prostatic hyperplasia)   . Essential hypertension 02/01/2013  . 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    LLyndee Hensen PT, DPT 3:35 PM  06/14/19    [facility] [address]Phone: [phone]  Fax:  [phone] Name: Kirk SALTONMRN: [id number] of Birth: [birth year]  PHYSICAL THERAPY DISCHARGE SUMMARY  Visits from Start of Care: 7 Plan: Patient agrees to discharge.  Patient goals were not met. Patient is being discharged due to lack of progress.  ?????     Pt referred back to MD.   LLyndee Hensen PT, DPT 10:14 AM  07/10/19

## 2019-06-14 NOTE — Patient Instructions (Signed)
Access Code: [email or web address]  URL: [email or web address]  Date: 06/14/2019  Prepared by: Lyndee Hensen   Exercises Seated Hamstring Stretch - 3 reps - 30 hold - 2x daily Single Knee to Chest Stretch - 3 reps - 30 hold - 2x daily Supine Piriformis Stretch Pulling Heel to Hip - 3 reps - 30 hold - 2x daily Supine Lower Trunk Rotation - 10 reps - 10 hold - 2x daily Supine March - 10 reps - 2 sets - 3 hold - 2x daily Standing Hip Abduction - 10 reps - 1-2 sets - 1x daily Seated Knee Extension AROM - 10 reps - 2 sets - 1x daily Straight Leg Raise - 10 reps - 1 sets - 1x daily Sidelying Hip Abduction - 10 reps - 2 sets - 1x daily

## 2019-06-25 ENCOUNTER — Ambulatory Visit (INDEPENDENT_AMBULATORY_CARE_PROVIDER_SITE_OTHER): Payer: PPO | Admitting: Family Medicine

## 2019-06-25 ENCOUNTER — Other Ambulatory Visit: Payer: Self-pay

## 2019-06-25 VITALS — BP 122/84 | HR 85 | Ht 73.0 in | Wt 188.0 lb

## 2019-06-25 DIAGNOSIS — M48061 Spinal stenosis, lumbar region without neurogenic claudication: Secondary | ICD-10-CM

## 2019-06-25 DIAGNOSIS — M549 Dorsalgia, unspecified: Secondary | ICD-10-CM | POA: Diagnosis not present

## 2019-06-25 DIAGNOSIS — G8929 Other chronic pain: Secondary | ICD-10-CM | POA: Diagnosis not present

## 2019-06-25 NOTE — Assessment & Plan Note (Signed)
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  Patient continues to have discomfort and pain.  Seems to be more the legs and the instability.  Patient has not been taking the medication regularly and encouraged him to take the Cymbalta.  Has worked hard with formal physical therapy but does not feel like he is improving.  Patient has failed injections as well.  Patient is to increase activity if tolerable but I would like to refer patient to neurosurgery to discuss the possibility of surgical intervention follow-up with me again in 4 to 8 weeks.

## 2019-06-25 NOTE — Patient Instructions (Signed)
Take Cymbalta daily Neurosurgery will call you See me again 3 weeks virtually/phone call

## 2019-06-25 NOTE — Progress Notes (Signed)
[name] [address] [place], [place] [address] Phone: [phone] Subjective:   [name], am serving as a scribe for Dr. Hulan Saas.  I'm seeing this patient by the request  of:    CC: Low back and leg pain follow-up  [id number]   05/11/2019 Discussed with patient again at great length with his wife at his side.  We discussed that I still believe that is likely secondary to the back and we could consider showing this with more of a nerve conduction study to further evaluate the idea of neuropathy versus spinal stenosis.  Patient given 2 medications which include meloxicam and 7.5 daily and warned of potential side effects and a low dose of Cymbalta.  Patient does not want to have another epidural.  Was responding well to physical therapy before shutdown occurred.  Patient will start with physical therapy again and given another 6 weeks.  We discussed if this seems to fail patient's only options will be epidural versus neurosurgery consult spent  Update 06/25/2019 Kirk [name] is a 78 y.o. male coming in with complaint of back pain. Patient states that his back pain is not better with physical therapy. Is having weakness with standing and has a hard time standing without the walker. Feels like he lost his appetite and finds it hard to keep up his strength. Pain continues to be in groin and radiate down his legs.  Patient states that he does not feel he is making any significant improvement.     Past Medical History:  Diagnosis Date  . Cataract   . Congenital glaucoma    "both eyes operated on when I was 33 months old"  . Cough   . Disturbance of skin sensation   . Diverticulosis of colon 2014  . Diverticulum of esophagus, acquired   . Dysphagia, unspecified(787.20)   . Encounter for long-term (current) use of other medications   . Herpes zoster without mention of complication   . Hyperglycemia   . Hyperlipidemia   . Hypertension   .  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 Lumbago   . Nevus, non-neoplastic   . Osteoarthrosis, unspecified whether generalized or localized, unspecified site    pt. denies  . Other premature beats   . Pain in limb   . Plantar fascial fibromatosis   . Special screening for malignant neoplasm of prostate   . Unspecified glaucoma(365.9)   . Unspecified pruritic disorder   . Unspecified vitamin D deficiency   . Urinary frequency   . Zenker's diverticulum    Past Surgical History:  Procedure Laterality Date  . CATARACT EXTRACTION BILATERAL W/ ANTERIOR VITRECTOMY Bilateral 1977  . COLONOSCOPY  09/06/2013   [name]  . DIRECT LARYNGOSCOPY  10/22/2015   Cervical esophagoscopy. Endoscopic esophageal diverticulotomy.   [name]  . GLAUCOMA SURGERY  1945   congenital glaucoma  . HERNIA REPAIR  1993  . [place]  2008  . LARYNGOSCOPY     with stapling of zenkers diverticulum   Social History   Socioeconomic History  . Marital status: Married    Spouse name: Not on file  . Number of children: Not on file  . Years of education: Not on file  . Highest education level: Not on file  Occupational History  . Not on file  Social Needs  . Financial resource strain: Not on file  .  Food insecurity    Worry: Not on file    Inability: Not on file  . Transportation needs    Medical: Not on file    Non-medical: Not on file  Tobacco Use  . Smoking status: Never Smoker  . Smokeless tobacco: Never Used  Substance and Sexual Activity  . Alcohol use: No  . Drug use: No  . Sexual activity: Not on file  Lifestyle  . Physical activity    Days per week: Not on file    Minutes per session: Not on file  . Stress: Not on file  Relationships  . Social Herbalist on phone: Not on file    Gets together: Not on file    Attends religious service: Not on file    Active member of club or  organization: Not on file    Attends meetings of clubs or organizations: Not on file    Relationship status: Not on file  Other Topics Concern  . Not on file  Social History Narrative   Married   Lives at home with wife Kirk [name])   No regular exercise, but does his own yard work and household chores.   Never smoked   Alcohol none   Allergies  Allergen Reactions  . Reserpine Other (See Comments)    Unknown reaction  . Sulfa Antibiotics Other (See Comments)    Patient denies - Unknown reaction   Family History  Problem Relation Age of Onset  . Hypertension Mother   . Dementia Mother   . Kidney disease Father   . CAD Father   . Colon cancer Neg Hx   . Throat cancer Neg Hx   . Diabetes Neg Hx   . Liver disease Neg Hx      Current Outpatient Medications (Cardiovascular):  .  lisinopril-hydrochlorothiazide (ZESTORETIC) 20-12.5 MG tablet, TAKE ONE TABLET BY MOUTH DAILY   Current Outpatient Medications (Analgesics):  .  acetaminophen (TYLENOL) 325 MG tablet, Take by mouth. .  meloxicam (MOBIC) 7.5 MG tablet, Take 1 tablet (7.5 mg total) by mouth daily.   Current Outpatient Medications (Other):  .  brimonidine (ALPHAGAN) 0.2 % ophthalmic solution,  .  cholecalciferol (VITAMIN D) 400 units TABS tablet, Take 400 Units by mouth daily. .  DULoxetine (CYMBALTA) 20 MG capsule, TAKE ONE CAPSULE BY MOUTH DAILY .  gabapentin (NEURONTIN) 100 MG capsule, Start with 1 tab po qhs X 1 week, then increase to 1 tab po bid X 1 week then 1 tab po tid prn .  timolol (TIMOPTIC) 0.5 % ophthalmic solution, Place 1 drop into the right eye daily.     Past medical history, social, surgical and family history all reviewed in electronic medical record.  No pertanent information unless stated regarding to the chief complaint.   Review of Systems:  No headache, visual changes, nausea, vomiting, diarrhea, constipation, dizziness, abdominal pain, skin rash, fevers, chills, night sweats, weight loss,  swollen lymph nodes, body aches, joint swelling, muscle aches, chest pain, shortness of breath, mood changes.   Objective  Blood pressure 122/84, pulse 85, height 6\' 1"  (1.854 m), weight 188 lb (85.3 kg), SpO2 99 %.    General: No apparent distress alert and oriented x3 mood and affect normal, dressed appropriately.  HEENT: Congenital glaucoma noted Respiratory: Patient's speak in full sentences and does not appear short of breath  Cardiovascular: Trace lower extremity edema, non tender, no erythema  Skin: Warm dry intact with no signs of infection or rash on  extremities or on axial skeleton.  Abdomen: Soft nontender  Neuro: Cranial nerves II through XII are intact, neurovascularly intact in all extremities with 2+ DTRs and 1+ pulses dorsalis pedis but symmetric.  Lymph: No lymphadenopathy of posterior or anterior cervical chain or axillae bilaterally.  Gait antalgic MSK: Patient is diffusely tender in most of his joints and does have known arthritic changes.  Low back exam has loss of lordosis with some mild degenerative scoliosis.  Patient has tightness with Corky Sox test bilaterally.  Negative straight leg test but is very tight in the hamstrings.  Patient does have atrophy of the thigh muscles.  Mild peripheral neuropathy noted   Impression and Recommendations:     This case required medical decision making of moderate complexity. The above documentation has been reviewed and is accurate and complete Lyndal Pulley, DO       Note: This dictation was prepared with Dragon dictation along with smaller phrase technology. Any transcriptional errors that result from this process are unintentional.

## 2019-07-12 DIAGNOSIS — M5416 Radiculopathy, lumbar region: Secondary | ICD-10-CM | POA: Diagnosis not present

## 2019-07-12 DIAGNOSIS — I10 Essential (primary) hypertension: Secondary | ICD-10-CM | POA: Diagnosis not present

## 2019-07-12 DIAGNOSIS — M48062 Spinal stenosis, lumbar region with neurogenic claudication: Secondary | ICD-10-CM | POA: Diagnosis not present

## 2019-07-15 NOTE — Progress Notes (Signed)
Virtual Visit via Video Note  I connected with Kirk [name] on 07/15/19 at  4:15 PM EDT by a video enabled telemedicine application and verified that I am speaking with the correct person using two identifiers.  Location: Patient: at home  Provider: in office    I discussed the limitations of evaluation and management by telemedicine and the availability of in person appointments. The patient expressed understanding and agreed to proceed.  History of Present Illness: went to neurosurgery, discussed with them about the spinal stenosis. Discussed alternatives as well. he felt increase pressure recently and increasing pain to a certain degree but does feel that the Cymbalta that we started made some mild improvement.  Patient feels fairly comfortable with the surgeon.  Looking forward to try to decrease some of the pain.    Observations/Objective: Alert and oriented x3, asking questions, congenital glaucoma noted   Assessment and Plan: Low back pain with radicular symptoms with severe spinal stenosis.  Failed all conservative therapy and encourage patient to consider the possibility of a surgical intervention.  Increase Cymbalta to 30 mg to see if this will make some mild improvement but do not think it will be the long-term treatment.  Patient is in agreement with the plan and will call back for any other directions.   Follow Up Instructions: As needed    I discussed the assessment and treatment plan with the patient. The patient was provided an opportunity to ask questions and all were answered. The patient agreed with the plan and demonstrated an understanding of the instructions.   The patient was advised to call back or seek an in-person evaluation if the symptoms worsen or if the condition fails to improve as anticipated.  I provided 27 minutes of non-face-to-face time during this encounter.   Lyndal Pulley, DO

## 2019-07-17 ENCOUNTER — Encounter: Payer: Self-pay | Admitting: Family Medicine

## 2019-07-17 ENCOUNTER — Ambulatory Visit (INDEPENDENT_AMBULATORY_CARE_PROVIDER_SITE_OTHER): Payer: PPO | Admitting: Family Medicine

## 2019-07-17 DIAGNOSIS — M48061 Spinal stenosis, lumbar region without neurogenic claudication: Secondary | ICD-10-CM

## 2019-07-17 MED ORDER — DULOXETINE HCL 30 MG PO CPEP: 30.0000 mg | ORAL_CAPSULE | Freq: Every day | ORAL | 1 refills | Status: DC

## 2019-07-26 ENCOUNTER — Other Ambulatory Visit: Payer: Self-pay

## 2019-07-26 MED ORDER — LISINOPRIL-HYDROCHLOROTHIAZIDE 20-12.5 MG PO TABS: 0.5000 | ORAL_TABLET | Freq: Every day | ORAL | 2 refills | Status: DC

## 2019-08-02 DIAGNOSIS — H01001 Unspecified blepharitis right upper eyelid: Secondary | ICD-10-CM | POA: Diagnosis not present

## 2019-08-02 DIAGNOSIS — H01005 Unspecified blepharitis left lower eyelid: Secondary | ICD-10-CM | POA: Diagnosis not present

## 2019-08-02 DIAGNOSIS — H01002 Unspecified blepharitis right lower eyelid: Secondary | ICD-10-CM | POA: Diagnosis not present

## 2019-08-02 DIAGNOSIS — H01004 Unspecified blepharitis left upper eyelid: Secondary | ICD-10-CM | POA: Diagnosis not present

## 2019-08-10 DIAGNOSIS — M48062 Spinal stenosis, lumbar region with neurogenic claudication: Secondary | ICD-10-CM | POA: Diagnosis not present

## 2019-08-14 ENCOUNTER — Other Ambulatory Visit: Payer: Self-pay | Admitting: Neurological Surgery

## 2019-08-15 ENCOUNTER — Other Ambulatory Visit (HOSPITAL_COMMUNITY)
Admission: RE | Admit: 2019-08-15 | Discharge: 2019-08-15 | Disposition: A | Discharge status: Home / Self Care | Payer: PPO | Source: Ambulatory Visit | Attending: Neurological Surgery | Admitting: Neurological Surgery

## 2019-08-15 ENCOUNTER — Encounter (HOSPITAL_COMMUNITY): Payer: Self-pay | Admitting: *Deleted

## 2019-08-15 ENCOUNTER — Other Ambulatory Visit: Payer: Self-pay

## 2019-08-15 DIAGNOSIS — M2550 Pain in unspecified joint: Secondary | ICD-10-CM | POA: Diagnosis not present

## 2019-08-15 DIAGNOSIS — R1311 Dysphagia, oral phase: Secondary | ICD-10-CM | POA: Diagnosis not present

## 2019-08-15 DIAGNOSIS — N400 Enlarged prostate without lower urinary tract symptoms: Secondary | ICD-10-CM | POA: Diagnosis not present

## 2019-08-15 DIAGNOSIS — Z981 Arthrodesis status: Secondary | ICD-10-CM | POA: Diagnosis not present

## 2019-08-15 DIAGNOSIS — R2681 Unsteadiness on feet: Secondary | ICD-10-CM | POA: Diagnosis not present

## 2019-08-15 DIAGNOSIS — Z20828 Contact with and (suspected) exposure to other viral communicable diseases: Secondary | ICD-10-CM | POA: Diagnosis not present

## 2019-08-15 DIAGNOSIS — Z01812 Encounter for preprocedural laboratory examination: Secondary | ICD-10-CM | POA: Insufficient documentation

## 2019-08-15 DIAGNOSIS — M48062 Spinal stenosis, lumbar region with neurogenic claudication: Secondary | ICD-10-CM | POA: Diagnosis not present

## 2019-08-15 DIAGNOSIS — M5126 Other intervertebral disc displacement, lumbar region: Secondary | ICD-10-CM | POA: Diagnosis not present

## 2019-08-15 DIAGNOSIS — M5116 Intervertebral disc disorders with radiculopathy, lumbar region: Secondary | ICD-10-CM | POA: Diagnosis not present

## 2019-08-15 DIAGNOSIS — R278 Other lack of coordination: Secondary | ICD-10-CM | POA: Diagnosis not present

## 2019-08-15 DIAGNOSIS — M6281 Muscle weakness (generalized): Secondary | ICD-10-CM | POA: Diagnosis not present

## 2019-08-15 DIAGNOSIS — R0902 Hypoxemia: Secondary | ICD-10-CM | POA: Diagnosis not present

## 2019-08-15 DIAGNOSIS — R52 Pain, unspecified: Secondary | ICD-10-CM | POA: Diagnosis not present

## 2019-08-15 DIAGNOSIS — E559 Vitamin D deficiency, unspecified: Secondary | ICD-10-CM | POA: Diagnosis not present

## 2019-08-15 DIAGNOSIS — I10 Essential (primary) hypertension: Secondary | ICD-10-CM | POA: Diagnosis not present

## 2019-08-15 DIAGNOSIS — R2689 Other abnormalities of gait and mobility: Secondary | ICD-10-CM | POA: Diagnosis not present

## 2019-08-15 DIAGNOSIS — Z8249 Family history of ischemic heart disease and other diseases of the circulatory system: Secondary | ICD-10-CM | POA: Diagnosis not present

## 2019-08-15 DIAGNOSIS — E785 Hyperlipidemia, unspecified: Secondary | ICD-10-CM | POA: Diagnosis not present

## 2019-08-15 DIAGNOSIS — H409 Unspecified glaucoma: Secondary | ICD-10-CM | POA: Diagnosis not present

## 2019-08-15 DIAGNOSIS — Z7401 Bed confinement status: Secondary | ICD-10-CM | POA: Diagnosis not present

## 2019-08-15 LAB — SARS CORONAVIRUS 2 (TAT 6-24 HRS): SARS Coronavirus 2: NEGATIVE

## 2019-08-15 NOTE — Progress Notes (Signed)
SDW call complete Patient verbalized understanding of instructions no further questions needed  Patient will need EKG and labs DOS

## 2019-08-16 ENCOUNTER — Ambulatory Visit (HOSPITAL_COMMUNITY): Payer: PPO

## 2019-08-16 ENCOUNTER — Ambulatory Visit (HOSPITAL_COMMUNITY): Payer: PPO | Admitting: Anesthesiology

## 2019-08-16 ENCOUNTER — Inpatient Hospital Stay (HOSPITAL_COMMUNITY)
Admission: RE | Admit: 2019-08-16 | Discharge: 2019-08-21 | DRG: 520 | Disposition: A | Discharge status: Skilled Nursing Facility | Payer: PPO | Attending: Neurological Surgery | Admitting: Neurological Surgery

## 2019-08-16 ENCOUNTER — Encounter (HOSPITAL_COMMUNITY)
Admission: RE | Disposition: A | Discharge status: Skilled Nursing Facility | Payer: Self-pay | Source: Home / Self Care | Attending: Neurological Surgery

## 2019-08-16 ENCOUNTER — Other Ambulatory Visit: Payer: Self-pay

## 2019-08-16 ENCOUNTER — Encounter (HOSPITAL_COMMUNITY): Payer: Self-pay

## 2019-08-16 DIAGNOSIS — N400 Enlarged prostate without lower urinary tract symptoms: Secondary | ICD-10-CM | POA: Diagnosis present

## 2019-08-16 DIAGNOSIS — M5116 Intervertebral disc disorders with radiculopathy, lumbar region: Secondary | ICD-10-CM | POA: Diagnosis present

## 2019-08-16 DIAGNOSIS — E785 Hyperlipidemia, unspecified: Secondary | ICD-10-CM | POA: Diagnosis not present

## 2019-08-16 DIAGNOSIS — M48062 Spinal stenosis, lumbar region with neurogenic claudication: Principal | ICD-10-CM | POA: Diagnosis present

## 2019-08-16 DIAGNOSIS — Z20828 Contact with and (suspected) exposure to other viral communicable diseases: Secondary | ICD-10-CM | POA: Diagnosis present

## 2019-08-16 DIAGNOSIS — Z8249 Family history of ischemic heart disease and other diseases of the circulatory system: Secondary | ICD-10-CM

## 2019-08-16 DIAGNOSIS — M5126 Other intervertebral disc displacement, lumbar region: Secondary | ICD-10-CM | POA: Diagnosis present

## 2019-08-16 DIAGNOSIS — Z981 Arthrodesis status: Secondary | ICD-10-CM | POA: Diagnosis not present

## 2019-08-16 DIAGNOSIS — Z419 Encounter for procedure for purposes other than remedying health state, unspecified: Secondary | ICD-10-CM

## 2019-08-16 DIAGNOSIS — I10 Essential (primary) hypertension: Secondary | ICD-10-CM | POA: Diagnosis present

## 2019-08-16 HISTORY — PX: LUMBAR LAMINECTOMY/ DECOMPRESSION WITH MET-RX: SHX5959

## 2019-08-16 LAB — CBC
HCT: 50.6 % (ref 39.0–52.0)
Hemoglobin: 16.6 g/dL (ref 13.0–17.0)
MCH: 30.8 pg (ref 26.0–34.0)
MCHC: 32.8 g/dL (ref 30.0–36.0)
MCV: 93.9 fL (ref 80.0–100.0)
Platelets: 219 10*3/uL (ref 150–400)
RBC: 5.39 MIL/uL (ref 4.22–5.81)
RDW: 13.2 % (ref 11.5–15.5)
WBC: 7.9 10*3/uL (ref 4.0–10.5)
nRBC: 0 % (ref 0.0–0.2)

## 2019-08-16 LAB — BASIC METABOLIC PANEL
Anion gap: 11 (ref 5–15)
BUN: 11 mg/dL (ref 8–23)
CO2: 21 mmol/L — ABNORMAL LOW (ref 22–32)
Calcium: 9.7 mg/dL (ref 8.9–10.3)
Chloride: 106 mmol/L (ref 98–111)
Creatinine, Ser: 0.78 mg/dL (ref 0.61–1.24)
GFR calc Af Amer: >60 mL/min (ref >60)
GFR calc non Af Amer: >60 mL/min (ref >60)
Glucose, Bld: 112 mg/dL — ABNORMAL HIGH (ref 70–99)
Potassium: 4.2 mmol/L (ref 3.5–5.1)
Sodium: 138 mmol/L (ref 135–145)

## 2019-08-16 SURGERY — LUMBAR LAMINECTOMY/ DECOMPRESSION WITH MET-RX
Anesthesia: General | Site: Spine Lumbar | Laterality: Left

## 2019-08-16 MED ORDER — CEFAZOLIN SODIUM-DEXTROSE 2-4 GM/100ML-% IV SOLN
2.0000 g | Freq: Three times a day (TID) | INTRAVENOUS | Status: AC
  Administered 2019-08-16 – 2019-08-17 (×2): 2 g via INTRAVENOUS
  Filled 2019-08-16 (×2): qty 100

## 2019-08-16 MED ORDER — ONDANSETRON HCL 4 MG PO TABS: 4.0000 mg | ORAL_TABLET | Freq: Four times a day (QID) | ORAL | Status: DC | PRN

## 2019-08-16 MED ORDER — OXYCODONE HCL 5 MG PO TABS
10.0000 mg | ORAL_TABLET | ORAL | Status: DC | PRN
  Administered 2019-08-16 – 2019-08-20 (×10): 10 mg via ORAL
  Filled 2019-08-16 (×11): qty 2

## 2019-08-16 MED ORDER — BRIMONIDINE TARTRATE 0.2 % OP SOLN
1.0000 [drp] | Freq: Two times a day (BID) | OPHTHALMIC | Status: DC
  Administered 2019-08-17 – 2019-08-21 (×8): 1 [drp] via OPHTHALMIC
  Filled 2019-08-16: qty 5

## 2019-08-16 MED ORDER — ONDANSETRON HCL 4 MG/2ML IJ SOLN
INTRAMUSCULAR | Status: DC | PRN
  Administered 2019-08-16: 4 mg via INTRAVENOUS

## 2019-08-16 MED ORDER — ONDANSETRON HCL 4 MG/2ML IJ SOLN
INTRAMUSCULAR | Status: AC
  Filled 2019-08-16: qty 2

## 2019-08-16 MED ORDER — GLYCOPYRROLATE 0.2 MG/ML IJ SOLN
INTRAMUSCULAR | Status: DC | PRN
  Administered 2019-08-16: 0.2 mg via INTRAVENOUS

## 2019-08-16 MED ORDER — LISINOPRIL 20 MG PO TABS
20.0000 mg | ORAL_TABLET | Freq: Every day | ORAL | Status: DC
  Administered 2019-08-17 – 2019-08-21 (×4): 20 mg via ORAL
  Filled 2019-08-16 (×5): qty 1

## 2019-08-16 MED ORDER — LISINOPRIL-HYDROCHLOROTHIAZIDE 20-12.5 MG PO TABS: 0.5000 | ORAL_TABLET | Freq: Every day | ORAL | Status: DC

## 2019-08-16 MED ORDER — SODIUM CHLORIDE 0.9% FLUSH
3.0000 mL | Freq: Two times a day (BID) | INTRAVENOUS | Status: DC
  Administered 2019-08-16 – 2019-08-20 (×8): 3 mL via INTRAVENOUS

## 2019-08-16 MED ORDER — LIDOCAINE-EPINEPHRINE 1 %-1:100000 IJ SOLN
INTRAMUSCULAR | Status: AC
  Filled 2019-08-16: qty 1

## 2019-08-16 MED ORDER — HYDROCHLOROTHIAZIDE 12.5 MG PO CAPS
12.5000 mg | ORAL_CAPSULE | Freq: Every day | ORAL | Status: DC
  Administered 2019-08-17 – 2019-08-21 (×5): 12.5 mg via ORAL
  Filled 2019-08-16 (×5): qty 1

## 2019-08-16 MED ORDER — POLYETHYLENE GLYCOL 3350 17 G PO PACK
17.0000 g | PACK | Freq: Every day | ORAL | Status: DC | PRN
  Administered 2019-08-19: 17 g via ORAL
  Filled 2019-08-16: qty 1

## 2019-08-16 MED ORDER — PROPOFOL 10 MG/ML IV BOLUS
INTRAVENOUS | Status: DC | PRN
  Administered 2019-08-16: 50 mg via INTRAVENOUS
  Administered 2019-08-16: 70 mg via INTRAVENOUS
  Administered 2019-08-16: 40 mg via INTRAVENOUS

## 2019-08-16 MED ORDER — PHENYLEPHRINE HCL (PRESSORS) 10 MG/ML IV SOLN
INTRAVENOUS | Status: DC | PRN
  Administered 2019-08-16: 40 ug via INTRAVENOUS

## 2019-08-16 MED ORDER — THROMBIN 5000 UNITS EX SOLR
CUTANEOUS | Status: AC
  Filled 2019-08-16: qty 5000

## 2019-08-16 MED ORDER — EPHEDRINE 5 MG/ML INJ
INTRAVENOUS | Status: AC
  Filled 2019-08-16: qty 10

## 2019-08-16 MED ORDER — DOCUSATE SODIUM 100 MG PO CAPS
100.0000 mg | ORAL_CAPSULE | Freq: Two times a day (BID) | ORAL | Status: DC
  Administered 2019-08-16 – 2019-08-21 (×9): 100 mg via ORAL
  Filled 2019-08-16 (×10): qty 1

## 2019-08-16 MED ORDER — FENTANYL CITRATE (PF) 100 MCG/2ML IJ SOLN
INTRAMUSCULAR | Status: AC
  Filled 2019-08-16: qty 2

## 2019-08-16 MED ORDER — ROCURONIUM BROMIDE 10 MG/ML (PF) SYRINGE
PREFILLED_SYRINGE | INTRAVENOUS | Status: AC
  Filled 2019-08-16: qty 30

## 2019-08-16 MED ORDER — PHENYLEPHRINE 40 MCG/ML (10ML) SYRINGE FOR IV PUSH (FOR BLOOD PRESSURE SUPPORT)
PREFILLED_SYRINGE | INTRAVENOUS | Status: AC
  Filled 2019-08-16: qty 20

## 2019-08-16 MED ORDER — CYCLOBENZAPRINE HCL 10 MG PO TABS
10.0000 mg | ORAL_TABLET | Freq: Three times a day (TID) | ORAL | Status: DC | PRN
  Administered 2019-08-16 – 2019-08-19 (×4): 10 mg via ORAL
  Filled 2019-08-16 (×5): qty 1

## 2019-08-16 MED ORDER — PROPOFOL 10 MG/ML IV BOLUS
INTRAVENOUS | Status: AC
  Filled 2019-08-16: qty 40

## 2019-08-16 MED ORDER — TIMOLOL MALEATE 0.5 % OP SOLN
1.0000 [drp] | Freq: Every day | OPHTHALMIC | Status: DC
  Administered 2019-08-17 – 2019-08-21 (×6): 1 [drp] via OPHTHALMIC
  Filled 2019-08-16 (×2): qty 5

## 2019-08-16 MED ORDER — SUGAMMADEX SODIUM 200 MG/2ML IV SOLN
INTRAVENOUS | Status: DC | PRN
  Administered 2019-08-16: 200 mg via INTRAVENOUS

## 2019-08-16 MED ORDER — PHENYLEPHRINE 40 MCG/ML (10ML) SYRINGE FOR IV PUSH (FOR BLOOD PRESSURE SUPPORT)
PREFILLED_SYRINGE | INTRAVENOUS | Status: AC
  Filled 2019-08-16: qty 10

## 2019-08-16 MED ORDER — FENTANYL CITRATE (PF) 250 MCG/5ML IJ SOLN
INTRAMUSCULAR | Status: AC
  Filled 2019-08-16: qty 5

## 2019-08-16 MED ORDER — FENTANYL CITRATE (PF) 100 MCG/2ML IJ SOLN
25.0000 ug | INTRAMUSCULAR | Status: DC | PRN
  Administered 2019-08-16 (×2): 25 ug via INTRAVENOUS

## 2019-08-16 MED ORDER — GLYCOPYRROLATE PF 0.2 MG/ML IJ SOSY
PREFILLED_SYRINGE | INTRAMUSCULAR | Status: AC
  Filled 2019-08-16: qty 1

## 2019-08-16 MED ORDER — 0.9 % SODIUM CHLORIDE (POUR BTL) OPTIME
TOPICAL | Status: DC | PRN
  Administered 2019-08-16: 1000 mL

## 2019-08-16 MED ORDER — ACETAMINOPHEN 325 MG PO TABS
650.0000 mg | ORAL_TABLET | ORAL | Status: DC | PRN
  Administered 2019-08-17: 04:00:00 650 mg via ORAL
  Filled 2019-08-16: qty 2

## 2019-08-16 MED ORDER — PROMETHAZINE HCL 25 MG/ML IJ SOLN: 6.2500 mg | INTRAMUSCULAR | Status: DC | PRN

## 2019-08-16 MED ORDER — EPHEDRINE SULFATE 50 MG/ML IJ SOLN
INTRAMUSCULAR | Status: DC | PRN
  Administered 2019-08-16: 10 mg via INTRAVENOUS

## 2019-08-16 MED ORDER — THROMBIN 5000 UNITS EX SOLR
OROMUCOSAL | Status: DC | PRN
  Administered 2019-08-16: 21:00:00 via TOPICAL

## 2019-08-16 MED ORDER — LACTATED RINGERS IV SOLN
INTRAVENOUS | Status: DC
  Administered 2019-08-16 (×2): via INTRAVENOUS

## 2019-08-16 MED ORDER — ACETAMINOPHEN 650 MG RE SUPP: 650.0000 mg | RECTAL | Status: DC | PRN

## 2019-08-16 MED ORDER — CELECOXIB 200 MG PO CAPS
400.0000 mg | ORAL_CAPSULE | Freq: Once | ORAL | Status: AC
  Administered 2019-08-16: 400 mg via ORAL
  Filled 2019-08-16: qty 2

## 2019-08-16 MED ORDER — ROCURONIUM BROMIDE 100 MG/10ML IV SOLN
INTRAVENOUS | Status: DC | PRN
  Administered 2019-08-16 (×2): 10 mg via INTRAVENOUS
  Administered 2019-08-16: 50 mg via INTRAVENOUS

## 2019-08-16 MED ORDER — LABETALOL HCL 5 MG/ML IV SOLN
INTRAVENOUS | Status: AC
  Filled 2019-08-16: qty 4

## 2019-08-16 MED ORDER — SODIUM CHLORIDE 0.9% FLUSH: 3.0000 mL | INTRAVENOUS | Status: DC | PRN

## 2019-08-16 MED ORDER — CHLORHEXIDINE GLUCONATE CLOTH 2 % EX PADS: 6.0000 | MEDICATED_PAD | Freq: Once | CUTANEOUS | Status: DC

## 2019-08-16 MED ORDER — LIDOCAINE-EPINEPHRINE 1 %-1:100000 IJ SOLN
INTRAMUSCULAR | Status: DC | PRN
  Administered 2019-08-16: 6 mL

## 2019-08-16 MED ORDER — HYDROMORPHONE HCL 1 MG/ML IJ SOLN
1.0000 mg | INTRAMUSCULAR | Status: DC | PRN
  Administered 2019-08-17: 01:00:00 1 mg via INTRAVENOUS
  Filled 2019-08-16: qty 1

## 2019-08-16 MED ORDER — SODIUM CHLORIDE 0.9 % IV SOLN
INTRAVENOUS | Status: DC | PRN
  Administered 2019-08-16: 21:00:00

## 2019-08-16 MED ORDER — ARTIFICIAL TEARS OPHTHALMIC OINT
TOPICAL_OINTMENT | OPHTHALMIC | Status: AC
  Filled 2019-08-16: qty 10.5

## 2019-08-16 MED ORDER — CEFAZOLIN SODIUM-DEXTROSE 2-4 GM/100ML-% IV SOLN
INTRAVENOUS | Status: AC
  Filled 2019-08-16: qty 100

## 2019-08-16 MED ORDER — DEXAMETHASONE SODIUM PHOSPHATE 10 MG/ML IJ SOLN
INTRAMUSCULAR | Status: AC
  Filled 2019-08-16: qty 1

## 2019-08-16 MED ORDER — MIDAZOLAM HCL 2 MG/2ML IJ SOLN
INTRAMUSCULAR | Status: AC
  Filled 2019-08-16: qty 2

## 2019-08-16 MED ORDER — ONDANSETRON HCL 4 MG/2ML IJ SOLN: 4.0000 mg | Freq: Four times a day (QID) | INTRAMUSCULAR | Status: DC | PRN

## 2019-08-16 MED ORDER — PHENYLEPHRINE HCL-NACL 10-0.9 MG/250ML-% IV SOLN
INTRAVENOUS | Status: DC | PRN
  Administered 2019-08-16: 25 ug/min via INTRAVENOUS

## 2019-08-16 MED ORDER — FENTANYL CITRATE (PF) 250 MCG/5ML IJ SOLN
INTRAMUSCULAR | Status: DC | PRN
  Administered 2019-08-16: 50 ug via INTRAVENOUS
  Administered 2019-08-16: 100 ug via INTRAVENOUS
  Administered 2019-08-16: 50 ug via INTRAVENOUS

## 2019-08-16 MED ORDER — MENTHOL 3 MG MT LOZG
1.0000 | LOZENGE | OROMUCOSAL | Status: DC | PRN
  Filled 2019-08-16 (×3): qty 9

## 2019-08-16 MED ORDER — LIDOCAINE HCL (CARDIAC) PF 100 MG/5ML IV SOSY
PREFILLED_SYRINGE | INTRAVENOUS | Status: DC | PRN
  Administered 2019-08-16: 80 mg via INTRATRACHEAL

## 2019-08-16 MED ORDER — SODIUM CHLORIDE 0.9 % IV SOLN: 250.0000 mL | INTRAVENOUS | Status: DC

## 2019-08-16 MED ORDER — CEFAZOLIN SODIUM-DEXTROSE 2-4 GM/100ML-% IV SOLN
2.0000 g | INTRAVENOUS | Status: AC
  Administered 2019-08-16: 2 g via INTRAVENOUS

## 2019-08-16 MED ORDER — PHENOL 1.4 % MT LIQD: 1.0000 | OROMUCOSAL | Status: DC | PRN

## 2019-08-16 MED ORDER — OXYCODONE HCL 5 MG PO TABS
5.0000 mg | ORAL_TABLET | ORAL | Status: DC | PRN
  Administered 2019-08-17 – 2019-08-21 (×3): 5 mg via ORAL
  Filled 2019-08-16 (×3): qty 1

## 2019-08-16 MED ORDER — LIDOCAINE 2% (20 MG/ML) 5 ML SYRINGE
INTRAMUSCULAR | Status: AC
  Filled 2019-08-16: qty 10

## 2019-08-16 MED ORDER — ACETAMINOPHEN 500 MG PO TABS
1000.0000 mg | ORAL_TABLET | Freq: Once | ORAL | Status: AC
  Administered 2019-08-16: 1000 mg via ORAL
  Filled 2019-08-16: qty 2

## 2019-08-16 MED ORDER — PROPOFOL 10 MG/ML IV BOLUS
INTRAVENOUS | Status: AC
  Filled 2019-08-16: qty 20

## 2019-08-16 SURGICAL SUPPLY — 48 items
BAG DECANTER FOR FLEXI CONT (MISCELLANEOUS) ×3 IMPLANT
BLADE CLIPPER SURG (BLADE) IMPLANT
BLADE SURG 11 STRL SS (BLADE) ×3 IMPLANT
BUR MATCHSTICK NEURO 3.0 LAGG (BURR) ×3 IMPLANT
BUR PRECISION FLUTE 5.0 (BURR) IMPLANT
CANISTER SUCT 3000ML PPV (MISCELLANEOUS) ×3 IMPLANT
COVER WAND RF STERILE (DRAPES) ×3 IMPLANT
DECANTER SPIKE VIAL GLASS SM (MISCELLANEOUS) ×3 IMPLANT
DERMABOND ADVANCED (GAUZE/BANDAGES/DRESSINGS) ×2
DERMABOND ADVANCED .7 DNX12 (GAUZE/BANDAGES/DRESSINGS) ×1 IMPLANT
DRAPE C-ARM 42X72 X-RAY (DRAPES) ×6 IMPLANT
DRAPE LAPAROTOMY 100X72X124 (DRAPES) ×3 IMPLANT
DRAPE MICROSCOPE LEICA (MISCELLANEOUS) ×3 IMPLANT
DRAPE SURG 17X23 STRL (DRAPES) ×3 IMPLANT
DURAPREP 26ML APPLICATOR (WOUND CARE) ×3 IMPLANT
ELECT BLADE 6.5 EXT (BLADE) ×3 IMPLANT
ELECT REM PT RETURN 9FT ADLT (ELECTROSURGICAL) ×3
ELECTRODE REM PT RTRN 9FT ADLT (ELECTROSURGICAL) ×1 IMPLANT
GAUZE 4X4 16PLY RFD (DISPOSABLE) IMPLANT
GAUZE SPONGE 4X4 12PLY STRL (GAUZE/BANDAGES/DRESSINGS) IMPLANT
GLOVE BIO SURGEON STRL SZ7.5 (GLOVE) ×3 IMPLANT
GLOVE BIOGEL PI IND STRL 7.5 (GLOVE) ×1 IMPLANT
GLOVE BIOGEL PI INDICATOR 7.5 (GLOVE) ×2
GLOVE EXAM NITRILE LRG STRL (GLOVE) IMPLANT
GLOVE EXAM NITRILE XL STR (GLOVE) IMPLANT
GLOVE EXAM NITRILE XS STR PU (GLOVE) IMPLANT
GOWN STRL REUS W/ TWL LRG LVL3 (GOWN DISPOSABLE) ×2 IMPLANT
GOWN STRL REUS W/ TWL XL LVL3 (GOWN DISPOSABLE) IMPLANT
GOWN STRL REUS W/TWL 2XL LVL3 (GOWN DISPOSABLE) IMPLANT
GOWN STRL REUS W/TWL LRG LVL3 (GOWN DISPOSABLE) ×4
GOWN STRL REUS W/TWL XL LVL3 (GOWN DISPOSABLE)
HEMOSTAT POWDER KIT SURGIFOAM (HEMOSTASIS) ×3 IMPLANT
KIT BASIN OR (CUSTOM PROCEDURE TRAY) ×3 IMPLANT
KIT TURNOVER KIT B (KITS) ×3 IMPLANT
NEEDLE HYPO 22GX1.5 SAFETY (NEEDLE) ×3 IMPLANT
NEEDLE SPNL 18GX3.5 QUINCKE PK (NEEDLE) ×3 IMPLANT
NS IRRIG 1000ML POUR BTL (IV SOLUTION) ×3 IMPLANT
PACK LAMINECTOMY NEURO (CUSTOM PROCEDURE TRAY) ×3 IMPLANT
PAD ARMBOARD 7.5X6 YLW CONV (MISCELLANEOUS) ×9 IMPLANT
RUBBERBAND STERILE (MISCELLANEOUS) ×6 IMPLANT
SPONGE LAP 4X18 RFD (DISPOSABLE) IMPLANT
SUT MNCRL AB 3-0 PS2 18 (SUTURE) IMPLANT
SUT VIC AB 0 CT1 18XCR BRD8 (SUTURE) IMPLANT
SUT VIC AB 0 CT1 8-18 (SUTURE)
SUT VIC AB 2-0 CP2 18 (SUTURE) ×3 IMPLANT
TOWEL GREEN STERILE (TOWEL DISPOSABLE) ×3 IMPLANT
TOWEL GREEN STERILE FF (TOWEL DISPOSABLE) ×3 IMPLANT
WATER STERILE IRR 1000ML POUR (IV SOLUTION) ×3 IMPLANT

## 2019-08-16 NOTE — Brief Op Note (Signed)
08/16/2019  9:48 PM  PATIENT:  Kirk [name]  78 y.o. male  PRE-OPERATIVE DIAGNOSIS:  Lumbar stenosis with neurogenic claudication  POST-OPERATIVE DIAGNOSIS:  Lumbar stenosis with neurogenic claudication  PROCEDURE:  Procedure(s) with comments: Left Lumbar Two-Three Minimally Invasive Laminectomy and Microdiscectomy (Left) - Left Lumbar 2-3 Minimally invasive laminectomy and microdiscectomy  SURGEON:  Surgeon(s) and Role:    * Judith Part, MD - Primary  PHYSICIAN ASSISTANT:   ASSISTANTS: none   ANESTHESIA:   general  EBL:  30cc  BLOOD ADMINISTERED:none  DRAINS: none   LOCAL MEDICATIONS USED:  LIDOCAINE   SPECIMEN:  No Specimen  DISPOSITION OF SPECIMEN:  N/A  COUNTS:  YES  TOURNIQUET:  * No tourniquets in log *  DICTATION: .Note written in EPIC  PLAN OF CARE: Admit for overnight observation  PATIENT DISPOSITION:  PACU - hemodynamically stable.   Delay start of Pharmacological VTE agent (>24hrs) due to surgical blood loss or risk of bleeding: yes

## 2019-08-16 NOTE — Op Note (Addendum)
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 I therefore recommended a minimally invasive L2-3 discectomy. This was discussed with the patient as well as risks, benefits, and alternatives and the patient wished to proceed with surgical treatment.    OPERATIVE DETAIL: A formal time out was performed with two patient identifiers and confirmed the operative site. Anesthesia was induced by the anesthesia team. Given the patient's ophthalmic issues, his head was placed in a Mayfield head holder to avoid any pressure on the face. He was then placed prone on the operating room table and all pressure points were padded. The operative site was marked, hair was clipped with surgical clippers, the area was then prepped and draped in a sterile fashion. Fluoroscopy was used to identify the surgical level prior to incision.   A 2cm incision was then marked 1cm off to the left of midline. The fascia was incised sharply and serial dilators were docked to the lamino-facet junction using fluoroscopy to confirm position as well as perform a second count to confirm the correct surgical level. After a final dilator was placed, a tubular retractor was placed over this and secured to the table. The operating microscope was draped and brought into the field. Anatomy was palpated and confirmed, monopolar  cautery was used to expose the facet, lamina, and a portion of the spinous process to confirm orientation. A high speed drill and kerrison rongeurs were then used to create a hemilaminotomy and small medial facetectomy. The ligamentum flavum was resected and the thecal sac and traversing nerve root were identified. 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 The thecal sac and traversing root were very adherent to the disc and could not be completely separated well despite multiple attempts. I was able to dissected the thecal sac away laterally to access the disc laterally in a region without any significant adhesions. An annulotomy was created sharply and large, free disc fragments that were easily removed. The annulotomy was explored and additional large free fragments were removed. The traversing nerve root and thecal sac were palpated confirm good decompression.   Hemostasis was obtained, the wound was copiously irrigated, and the tube was removed while using the microscope to confirm hemostasis of the muscle edges. All instrument and sponge counts were correct and the incision was then closed in layers. The patient was then returned to anesthesia for emergence. No apparent complications at the completion of the procedure.    EBL:  12mL   DRAINS: none   SPECIMENS: none   Judith Part, MD 08/16/19 8:07 PM

## 2019-08-16 NOTE — Anesthesia Preprocedure Evaluation (Addendum)
Anesthesia Evaluation  Patient identified by MRN, date of birth, ID band Patient awake    Reviewed: Allergy & Precautions, NPO status , Patient's Chart, lab work & pertinent test results  History of Anesthesia Complications Negative for: history of anesthetic complications  Airway Mallampati: III  TM Distance: >3 FB Neck ROM: Full    Dental  (+) Dental Advisory Given, Caps   Pulmonary neg pulmonary ROS,    breath sounds clear to auscultation       Cardiovascular hypertension, Pt. on medications Normal cardiovascular exam  glaucoma   Neuro/Psych negative neurological ROS     GI/Hepatic negative GI ROS, Neg liver ROS,   Endo/Other  negative endocrine ROS  Renal/GU negative Renal ROS     Musculoskeletal negative musculoskeletal ROS (+)   Abdominal   Peds  Hematology negative hematology ROS (+)   Anesthesia Other Findings Day of surgery medications reviewed with the patient.  Reproductive/Obstetrics                           Anesthesia Physical Anesthesia Plan  ASA: II  Anesthesia Plan: General   Post-op Pain Management:    Induction: Intravenous  PONV Risk Score and Plan: 2 and Ondansetron, Dexamethasone and Diphenhydramine  Airway Management Planned: Oral ETT  Additional Equipment:   Intra-op Plan:   Post-operative Plan: Extubation in OR  Informed Consent: I have reviewed the patients History and Physical, chart, labs and discussed the procedure including the risks, benefits and alternatives for the proposed anesthesia with the patient or authorized representative who has indicated his/her understanding and acceptance.     Dental advisory given  Plan Discussed with: Anesthesiologist  Anesthesia Plan Comments: (Severe congenital glaucoma)       Anesthesia Quick Evaluation

## 2019-08-16 NOTE — H&P (Signed)
Surgical H&P Update  HPI: 78 y.o. man with h/o bilateral lower extremity radicular pain consistent with neurogenic claudication, worse on the left than right. His Sx were not responsive to nonsurgical treatments and an MRI showed corresponding lumbar stenosis due to a large herniated disc at L2-3. No changes in health since she was last seen. Still having leg pain and wishes to proceed with surgery.  PMHx:  Past Medical History:  Diagnosis Date  . Cataract   . Congenital glaucoma    "both eyes operated on when I was 60 months old"  . Cough   . Disturbance of skin sensation   . Diverticulosis of colon 2014  . Diverticulum of esophagus, acquired   . Dysphagia, unspecified(787.20)   . Encounter for long-term (current) use of other medications   . Herpes zoster without mention of complication   . Hyperglycemia    patient denies  . Hyperlipidemia   . Hypertension   . 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 Lumbago   . Nevus, non-neoplastic   . Osteoarthrosis, unspecified whether generalized or localized, unspecified site    pt. denies  . Other premature beats   . Pain in limb   . Plantar fascial fibromatosis   . Special screening for malignant neoplasm of prostate   . Unspecified glaucoma(365.9)   . Unspecified pruritic disorder   . Unspecified vitamin D deficiency   . Urinary frequency   . Zenker's diverticulum    FamHx:  Family History  Problem Relation Age of Onset  . Hypertension Mother   . Dementia Mother   . Kidney disease Father   . CAD Father   . Colon cancer Neg Hx   . Throat cancer Neg Hx   . Diabetes Neg Hx   . Liver disease Neg Hx    SocHx:  reports that he has never smoked. He has never used smokeless tobacco. He reports that he does not drink alcohol or use drugs.  Physical Exam: AOx3, PERRL, FS, TM  Strength 5/5 x4 except L EHL 3/5, SILTx4  Assesment/Plan: 78 y.o. man with h/o lumbar  stenosis with neurogenic claudication, here for left L2-3 decompression and microdiscectomy. Risks, benefits, and alternatives discussed and the patient would like to continue with surgery. We discussed using a Mayfield to suspend his head and keep facial pressure off during surgery as well as optimizing MAPs for retinal perfusion given his ophthalmologic issues.  -OR today -3C post-op  Judith Part, MD 08/16/19 7:02 PM

## 2019-08-16 NOTE — Anesthesia Procedure Notes (Signed)
Procedure Name: Intubation Date/Time: 08/16/2019 8:09 PM Performed by: Valetta Fuller, CRNA Pre-anesthesia Checklist: Patient identified, Emergency Drugs available, Suction available and Patient being monitored Patient Re-evaluated:Patient Re-evaluated prior to induction Oxygen Delivery Method: Circle system utilized Preoxygenation: Pre-oxygenation with 100% oxygen Induction Type: IV induction Ventilation: Mask ventilation without difficulty and Oral airway inserted - appropriate to patient size Laryngoscope Size: Sabra Heck and 2 Grade View: Grade II Tube size: 7.5 mm Number of attempts: 2 Airway Equipment and Method: Stylet Placement Confirmation: ETT inserted through vocal cords under direct vision,  positive ETCO2 and breath sounds checked- equal and bilateral Secured at: 25 cm Tube secured with: Tape Dental Injury: Teeth and Oropharynx as per pre-operative assessment

## 2019-08-16 NOTE — Progress Notes (Signed)
Called the OR for an update on how much longer patient will have to wait for his surgery.  Informed patient that Dr Zada Finders was running behind because he had a complex surgical case that he was still in the OR providing the best care for the patient.  Informed patient that he may be delayed another 1-2 hrs.  Patient verbalized understanding.  Wanted me to go out to the lobby to update his wife Pamala Hurry.  Went to lobby, updated his wife Pamala Hurry, encourage her to go get something to eat/drink and that she will continue to get text messages on her phone with updates.  Let patient know that I spoke with his wife.  Patient wanted to get out of bed and sit in chair for a while.  Assisted patient to chair with warm blankets, call bell in hand.  Will continue to monitor patient in Surgical Short Stay.

## 2019-08-16 NOTE — Transfer of Care (Signed)
Immediate Anesthesia Transfer of Care Note  Patient: Kirk [name]  Procedure(s) Performed: Left Lumbar Two-Three Minimally Invasive Laminectomy and Microdiscectomy (Left Spine Lumbar)  Patient Location: PACU  Anesthesia Type:General  Level of Consciousness: sedated and patient cooperative  Airway & Oxygen Therapy: Patient Spontanous Breathing  Post-op Assessment: Report given to RN and Post -op Vital signs reviewed and stable  Post vital signs: Reviewed and stable  Last Vitals:  Vitals Value Taken Time  BP    Temp    Pulse 86 08/16/19 2203  Resp 10 08/16/19 2203  SpO2 99 % 08/16/19 2203  Vitals shown include unvalidated device data.  Last Pain:  Vitals:   08/16/19 1259  TempSrc:   PainSc: 0-No pain         Complications: No apparent anesthesia complications

## 2019-08-17 ENCOUNTER — Encounter (HOSPITAL_COMMUNITY): Payer: Self-pay | Admitting: Neurological Surgery

## 2019-08-17 MED ORDER — TAMSULOSIN HCL 0.4 MG PO CAPS
0.4000 mg | ORAL_CAPSULE | Freq: Every day | ORAL | Status: DC
  Administered 2019-08-17 – 2019-08-21 (×5): 0.4 mg via ORAL
  Filled 2019-08-17 (×5): qty 1

## 2019-08-17 MED ORDER — TAMSULOSIN HCL 0.4 MG PO CAPS
0.8000 mg | ORAL_CAPSULE | Freq: Once | ORAL | Status: AC
  Administered 2019-08-17: 0.8 mg via ORAL
  Filled 2019-08-17: qty 2

## 2019-08-17 NOTE — Anesthesia Postprocedure Evaluation (Signed)
Anesthesia Post Note  Patient: Kirk [name]  Procedure(s) Performed: Left Lumbar Two-Three Minimally Invasive Laminectomy and Microdiscectomy (Left Spine Lumbar)     Patient location during evaluation: PACU Anesthesia Type: General Level of consciousness: awake and alert Pain management: pain level controlled Vital Signs Assessment: post-procedure vital signs reviewed and stable Respiratory status: spontaneous breathing, nonlabored ventilation and respiratory function stable Cardiovascular status: blood pressure returned to baseline and stable Postop Assessment: no apparent nausea or vomiting Anesthetic complications: no    Last Vitals:  Vitals:   08/16/19 2246 08/16/19 2319  BP: 139/84 (!) 165/92  Pulse: 72 74  Resp: 20 18  Temp: 36.4 C 36.6 C  SpO2: 99% 100%    Last Pain:  Vitals:   08/17/19 0045  TempSrc:   PainSc: 10-Worst pain ever                 Catalina Gravel

## 2019-08-17 NOTE — Evaluation (Signed)
Occupational Therapy Evaluation Patient Details Name: Kirk [name] MRN: [id number] DOB: 1941/07/13 Today's Date: 08/17/2019    History of Present Illness Pt is a 78 yo male s/p L2-3 laminectomy and microdiscectomy. PMHx: HTN   Clinical Impression   Pt PTA: Living with spouse, reports a fall 4-5 months ago and spouse cannot physically assist pt due to her own health concerns. Pt reports spouse and himself help each other for ADL and mobility - he uses a SPC. Pt currently with delayed processing (could be from pain) with long wided answers; pain and inability to care for self or mobilize without assist. OTR attempting to assist pt with LB dressing using AE, but pt is unable to assist due to pt's poor vision at baseline. Pt education for LB ADL, but ultimately pt maxA for LB dressing. Pt modA for sit to stand to RW and assist for stability. Pt unable to stand at sink for ADL and barely able to stand in hallway with RNs unattaching IV pole for ~30 seconds of static standing. Pt ambulating 20' with minA overall with RW and knees appear to be slightly flexed and fear of buckling. Pt is unsafe to return home as he needs increased assist for ADL and mobility. Pt would benefit from continued OT skilled services for ADL, mobility and safety in CIR setting. OT following.  If unable to get admitted to CIR, pt requires post acute rehab with 24/7 [place].    Follow Up Recommendations  CIR;Supervision/Assistance - 24 hour    Equipment Recommendations  None recommended by OT    Recommendations for Other Services       Precautions / Restrictions Precautions Precautions: Fall;Back Precaution Booklet Issued: Yes (comment) Precaution Comments: Vebrally discussed; pt's vision is very poor and unable to read handout well. Restrictions Weight Bearing Restrictions: No      Mobility Bed Mobility Overal bed mobility: Needs Assistance Bed Mobility: Sidelying to Sit;Supine to Sit;Sit to Supine;Sit to  Sidelying   Sidelying to sit: Min assist Supine to sit: Min assist Sit to supine: Min assist Sit to sidelying: Min assist General bed mobility comments: minA for proper hand placement and BLE management  Transfers Overall transfer level: Needs assistance Equipment used: Rolling walker (2 wheeled) Transfers: Sit to/from [facility] Sit to Stand: Mod assist Stand pivot transfers: Mod assist       General transfer comment: ModA for power up; minA for stability and for proper hand placement    Balance Overall balance assessment: Needs assistance Sitting-balance support: Feet supported;Bilateral upper extremity supported Sitting balance-Leahy Scale: Fair     Standing balance support: Bilateral upper extremity supported Standing balance-Leahy Scale: Poor Standing balance comment: heavy reliance on RW; BLE knees flexed with mobility and knees dipping for near buckling.                           ADL either performed or assessed with clinical judgement   ADL Overall ADL's : Needs assistance/impaired Eating/Feeding: Set up;Sitting Eating/Feeding Details (indicate cue type and reason): due to poor vision in new environment Grooming: Set up;Sitting   Upper Body Bathing: Set up;Sitting   Lower Body Bathing: Maximal assistance;Sitting/lateral leans;Sit to/from stand;Cueing for safety   Upper Body Dressing : Minimal assistance;Sitting Upper Body Dressing Details (indicate cue type and reason): due to poor vision in new environment Lower Body Dressing: Maximal assistance;Cueing for safety;Sitting/lateral leans;Sit to/from stand Lower Body Dressing Details (indicate cue type and reason): OTR attempting  to assist pt with LB dressing using AE, but pt is unable to assist due to pt's poor vision. Pt education for LB ADL, but ultimately pt maxA for LB dressing. Toilet Transfer: Moderate assistance;Cueing for safety;Stand-pivot Toilet Transfer Details (indicate cue  type and reason): Increased time and cues for hand placement Toileting- Clothing Manipulation and Hygiene: Maximal assistance;Cueing for safety;Sitting/lateral lean;Sit to/from stand       Functional mobility during ADLs: Minimal assistance;Cueing for safety;Rolling walker General ADL Comments: Pt limited by poor vision, decreased strength and decreased actiivity tolerance with increased need for caregiver assist for all LB ADL.     Vision Baseline Vision/History: Wears glasses;Glaucoma Wears Glasses: At all times Patient Visual Report: No change from baseline Vision Assessment?: Vision impaired- to be further tested in functional context Additional Comments: Pt with congenital glaucoma     Perception     Praxis      Pertinent Vitals/Pain Pain Assessment: 0-10 Pain Score: 5  Pain Location: low back Pain Descriptors / Indicators: Discomfort;Grimacing Pain Intervention(s): Monitored during session;Repositioned     Hand Dominance Right   Extremity/Trunk Assessment Upper Extremity Assessment Upper Extremity Assessment: Generalized weakness   Lower Extremity Assessment Lower Extremity Assessment: Defer to PT evaluation;Generalized weakness;RLE deficits/detail;LLE deficits/detail RLE Deficits / Details: decreased strength LLE Deficits / Details: decreased strength   Cervical / Trunk Assessment Cervical / Trunk Assessment: Other exceptions Cervical / Trunk Exceptions: s/p lumbar sx   Communication Communication Communication: Expressive difficulties;Other (comment)(slow to respond)   Cognition Arousal/Alertness: Awake/alert Behavior During Therapy: WFL for tasks assessed/performed Overall Cognitive Status: No family/caregiver present to determine baseline cognitive functioning                                 General Comments: Pt with delayed processing (could be from pain) with long wided answers   General Comments  Pt unable to stand at sink for ADL and  barely able to stand in hallway with RNs unattaching IV pole for ~30 seconds of static standing. Pt ambulating 20' with minA overall with RW and knees appear to be slightly flexed and fear of buckling,.     Exercises     Shoulder Instructions      Home Living Family/patient expects to be discharged to:: Private residence Living Arrangements: Spouse/significant other Available Help at Discharge: Family;Available 24 hours/day(just for supervisionA, not for physical assist.) Type of Home: House Home Access: Stairs to enter [employer] of Steps: 5 Entrance Stairs-Rails: Right Home Layout: One level     Bathroom Shower/Tub: Occupational psychologist: Handicapped height     Home Equipment: Environmental consultant - 2 wheels;Cane - single point;Bedside commode;Shower seat          Prior Functioning/Environment Level of Independence: Needs assistance  Gait / Transfers Assistance Needed: Reports SPC use. Pt reports recent OP PT for back. ADL's / Homemaking Assistance Needed: Pt and spouse assist eachother per pt.            OT Problem List: Decreased strength;Decreased activity tolerance;Impaired balance (sitting and/or standing);Pain;Decreased safety awareness;Decreased knowledge of use of DME or AE;Impaired vision/perception      OT Treatment/Interventions: Self-care/ADL training;Therapeutic exercise;Energy conservation;DME and/or AE instruction;Therapeutic activities;Patient/family education;Balance training    OT Goals(Current goals can be found in the care plan section) Acute Rehab OT Goals Patient Stated Goal: to walk better OT Goal Formulation: With patient Time For Goal Achievement: 08/31/19 Potential to Achieve Goals: Good  ADL Goals Pt Will Perform Grooming: (P) with supervision;standing Pt Will Perform Lower Body Dressing: (P) with min assist;sitting/lateral leans;sit to/from stand;with adaptive equipment Pt Will Transfer to Toilet: (P) with  supervision;ambulating Pt Will Perform Toileting - Clothing Manipulation and hygiene: (P) with min guard assist;sitting/lateral leans;sit to/from stand Pt/caregiver will Perform Home Exercise Program: (P) Increased strength;Both right and left upper extremity;With minimal assist  OT Frequency: Min 2X/week   Barriers to D/C: Decreased caregiver support  no physical assist at home       Co-evaluation              AM-PAC OT "6 Clicks" Daily Activity     Outcome Measure Help from another person eating meals?: None Help from another person taking care of personal grooming?: A Little Help from another person toileting, which includes using toliet, bedpan, or urinal?: A Lot Help from another person bathing (including washing, rinsing, drying)?: A Lot Help from another person to put on and taking off regular upper body clothing?: A Little Help from another person to put on and taking off regular lower body clothing?: A Lot 6 Click Score: 16   End of Session Equipment Utilized During Treatment: Gait belt;Rolling walker Nurse Communication: Mobility status;Precautions  Activity Tolerance: Patient limited by pain Patient left: in bed;with call bell/phone within reach;Other (comment)(PT coming into room)  OT Visit Diagnosis: Unsteadiness on feet (R26.81);Muscle weakness (generalized) (M62.81)                Time: [id number] OT Time Calculation (min): 43 min Charges:  OT General Charges $OT Visit: 1 Visit OT Evaluation $OT Eval Moderate Complexity: 1 Mod OT Treatments $Self Care/Home Management : 8-22 mins $Therapeutic Activity: 8-22 mins  Ebony Hail Harold Hedge) Marsa Aris OTR/L Acute Rehabilitation Services Pager: [phone] Office: [place] 08/17/2019, 8:44 AM

## 2019-08-17 NOTE — Discharge Summary (Addendum)
Discharge Summary  Date of Admission: 08/16/2019  Date of Discharge: 08/21/19  Attending Physician: Emelda Brothers, MD  Hospital Course: Patient was admitted following an uncomplicated left [id number] MIS microdiscectomy. He was recovered in PACU and transferred to [facility]. His hospital course was uncomplicated. He was seen by PT/OT who recommended SNF placement and the patient was discharged to SNF on 08/21/2019. He will follow up in clinic with me in 3 weeks.  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   brimonidine 0.2 % ophthalmic solution Commonly known as: ALPHAGAN Place 1 drop into both eyes 2 (two) times daily.   Cholecalciferol 25 MCG (1000 UT) tablet Take 1,000 Units by mouth daily.   lisinopril-hydrochlorothiazide 20-12.5 MG tablet Commonly known as: ZESTORETIC Take 0.5 tablets by mouth daily.   oxyCODONE 5 MG immediate release tablet Commonly known as: Oxy IR/ROXICODONE Take 1 tablet (5 mg total) by mouth every 4 (four) hours as needed (pain).   timolol 0.5 % ophthalmic solution Commonly known as: TIMOPTIC Place 1 drop into the right eye daily.        Judith Part, MD  08/21/19 10:35 AM

## 2019-08-17 NOTE — Progress Notes (Signed)
Physical Therapy Treatment Patient Details Name: Kirk [name] MRN: [id number] DOB: 1941/05/13 Today's Date: 08/17/2019    History of Present Illness Pt is a 78 yo male s/p L2-3 laminectomy and microdiscectomy. PMHx: HTN, congenital glaucoma with low vision, prostate CA, shingles, dysphagia.    PT Comments    Pt seen a second time at RN request for discussion with wife regarding PT/OT recommendations at d/c. We discussed at length current level of function and the anticipated level of physical assistance pt would require if he discharged home. Wife initially reluctant to agree to SNF due to Covid-19 risk and visitor restrictions. We discussed that an aide would most likely not be available 24 hours unless they were interested in a private pay option. Pt and wife both agreed that SNF level rehab would be the safest option after we discussed a car transfer, negotiating stairs to enter home, need for assist for ADL's and that wife would have to help the pt in the bathroom outside of the time home health would be present. At the end of our discussion, pt and wife state they want to pursue SNF level therapy at d/c and ask to look into [facility]. RN notified. The pt was present and engaged in discussion throughout encounter.   Follow Up Recommendations  SNF;Supervision/Assistance - 24 hour     Equipment Recommendations  Rolling walker with 5" wheels    Recommendations for Other Services       Precautions / Restrictions Precautions Precautions: Fall;Back Precaution Booklet Issued: Yes (comment) Precaution Comments: Pt was cued for maintenance of precautions during functional mobility. Pt's vision is very poor and unable to read handout well. Restrictions Weight Bearing Restrictions: No    Mobility  Bed Mobility Overal bed mobility: Needs Assistance Bed Mobility: Sidelying to Sit;Supine to Sit;Sit to Supine;Sit to Sidelying   Sidelying to sit: Min assist Supine  to sit: Min assist Sit to supine: Min assist Sit to sidelying: Min assist General bed mobility comments: Pt was received sitting up on EOB when PT arrived.   Transfers Overall transfer level: Needs assistance Equipment used: Rolling walker (2 wheeled) Transfers: Sit to/from [facility] Sit to Stand: Mod assist Stand pivot transfers: Mod assist       General transfer comment: Mod assist for power up to full standing position; min assist for walker stability and for proper hand placement  Ambulation/Gait Ambulation/Gait assistance: Min assist;+2 safety/equipment Gait Distance (Feet): 50 Feet Assistive device: Rolling walker (2 wheeled) Gait Pattern/deviations: Decreased stride length;Shuffle;Trunk flexed;Narrow base of support Gait velocity: Decreased Gait velocity interpretation: <1.8 ft/sec, indicate of risk for recurrent falls General Gait Details: Pt required seated rest break after 10' and then was able to ambulate another ~40 feet in hall. +2 for close chair follow and heavy min assist for balance and walker management. Pt demonstrating decreased safety awareness throughout OOB mobility.    Stairs             Wheelchair Mobility    Modified Rankin (Stroke Patients Only)       Balance Overall balance assessment: Needs assistance Sitting-balance support: Feet supported;Bilateral upper extremity supported Sitting balance-Leahy Scale: Fair     Standing balance support: Bilateral upper extremity supported Standing balance-Leahy Scale: Poor Standing balance comment: heavy reliance on RW; BLE knees flexed with mobility and knees dipping for near buckling.  Cognition Arousal/Alertness: Awake/alert Behavior During Therapy: WFL for tasks assessed/performed Overall Cognitive Status: (Baseline per wife)                                 General Comments: Pt with delayed processing (could be from pain)  with long winded answers      Exercises      General Comments General comments (skin integrity, edema, etc.): Pt unable to stand at sink for ADL and barely able to stand in hallway with RNs unattaching IV pole for ~30 seconds of static standing. Pt ambulating 20' with minA overall with RW and knees appear to be slightly flexed and fear of buckling,.       Pertinent Vitals/Pain Pain Assessment: Faces Pain Score: 5  Faces Pain Scale: Hurts a little bit Pain Location: (low back laying in bed) Pain Descriptors / Indicators: Grimacing;Operative site guarding Pain Intervention(s): Limited activity within patient's tolerance;Monitored during session;Repositioned    Home Living Family/patient expects to be discharged to:: Private residence Living Arrangements: Spouse/significant other Available Help at Discharge: Family;Available 24 hours/day(just for supervision A, not for physical assist.) Type of Home: House Home Access: Stairs to enter Entrance Stairs-Rails: Right Home Layout: One level Home Equipment: Walker - 2 wheels;Cane - single point;Bedside commode;Shower seat      Prior Function Level of Independence: Needs assistance  Gait / Transfers Assistance Needed: Reports SPC use. Pt reports recent OP PT for back. ADL's / Homemaking Assistance Needed: Pt and spouse assist eachother per pt.     PT Goals (current goals can now be found in the care plan section) Acute Rehab PT Goals Patient Stated Goal: to walk better PT Goal Formulation: With patient/family Time For Goal Achievement: 08/31/19 Potential to Achieve Goals: Good Progress towards PT goals: Progressing toward goals    Frequency    Min 5X/week      PT Plan Current plan remains appropriate    Co-evaluation              AM-PAC PT "6 Clicks" Mobility   Outcome Measure  Help needed turning from your back to your side while in a flat bed without using bedrails?: A Little Help needed moving from lying on  your back to sitting on the side of a flat bed without using bedrails?: A Little Help needed moving to and from a bed to a chair (including a wheelchair)?: A Little Help needed standing up from a chair using your arms (e.g., wheelchair or bedside chair)?: A Little Help needed to walk in hospital room?: A Little Help needed climbing 3-5 steps with a railing? : Total 6 Click Score: 16    End of Session Equipment Utilized During Treatment: Gait belt Activity Tolerance: Patient limited by fatigue(weakness) Patient left: in bed;with call bell/phone within reach;with family/visitor present Nurse Communication: Other (comment)(Recommendation for SNF level rehab at d/c confirmed) PT Visit Diagnosis: Unsteadiness on feet (R26.81);Pain;Muscle weakness (generalized) (M62.81);Difficulty in walking, not elsewhere classified (R26.2) Pain - part of body: (back)     Time: [id number] PT Time Calculation (min) (ACUTE ONLY): 18 min  Charges:  $Self Care/Home Management: 8-22                     Rolinda Roan, PT, DPT Acute Rehabilitation Services Pager: [phone] Office: [phone]    Thelma Comp 08/17/2019, 11:18 AM

## 2019-08-17 NOTE — Discharge Instructions (Signed)
Discharge Instructions ° °No restriction in activities, slowly increase your activity back to normal.  ° °Your incision is closed with dermabond (purple glue). This will naturally fall off over the next 1-2 weeks.  ° °Okay to shower on the day of discharge. Use regular soap and water and try to be gentle when cleaning your incision.  ° °Follow up with Dr. Charvis Lightner in 2 weeks after discharge. If you do not already have a discharge appointment, please call his office at [phone] to schedule a follow up appointment. If you have any concerns or questions, please call the office and let us know. °

## 2019-08-17 NOTE — Evaluation (Signed)
Physical Therapy Evaluation Patient Details Name: Kirk [name] MRN: [id number] DOB: 03-Dec-1940 Today's Date: 08/17/2019   History of Present Illness  Pt is a 78 yo male s/p L2-3 laminectomy and microdiscectomy. PMHx: HTN, congenital glaucoma with low vision, prostate CA, shingles, dysphagia.  Clinical Impression  Pt admitted with above diagnosis. At the time of PT eval, pt was able to demonstrate transfers and ambulation with up to mod assist and +2 for chair follow/safety. Pt was educated on precautions, positioning recommendations, appropriate activity progression, and car transfer. Pt currently with functional limitations due to the deficits listed below (see PT Problem List). Pt will benefit from skilled PT to increase their independence and safety with mobility to allow discharge to the venue listed below. Pt reports that wife has physical deficits as well and is not able to provide physical assistance for him at d/c, so pt will need to be at a supervision level prior to return home if they cannot arrange 24 hour assistance in addition to the wife. Recommending SNF level follow-up at this time.       Follow Up Recommendations SNF;Supervision/Assistance - 24 hour    Equipment Recommendations  Rolling walker with 5" wheels    Recommendations for Other Services       Precautions / Restrictions Precautions Precautions: Fall;Back Precaution Booklet Issued: Yes (comment) Precaution Comments: Pt was cued for maintenance of precautions during functional mobility. Pt's vision is very poor and unable to read handout well. Restrictions Weight Bearing Restrictions: No      Mobility  Bed Mobility Overal bed mobility: Needs Assistance Bed Mobility: Sidelying to Sit;Supine to Sit;Sit to Supine;Sit to Sidelying   Sidelying to sit: Min assist Supine to sit: Min assist Sit to supine: Min assist Sit to sidelying: Min assist General bed mobility comments: Pt was received sitting up on EOB  when PT arrived.   Transfers Overall transfer level: Needs assistance Equipment used: Rolling walker (2 wheeled) Transfers: Sit to/from [facility] Sit to Stand: Mod assist Stand pivot transfers: Mod assist       General transfer comment: Mod assist for power up to full standing position; min assist for walker stability and for proper hand placement  Ambulation/Gait Ambulation/Gait assistance: Min assist;+2 safety/equipment Gait Distance (Feet): 50 Feet Assistive device: Rolling walker (2 wheeled) Gait Pattern/deviations: Decreased stride length;Shuffle;Trunk flexed;Narrow base of support Gait velocity: Decreased Gait velocity interpretation: <1.8 ft/sec, indicate of risk for recurrent falls General Gait Details: Pt required seated rest break after 10' and then was able to ambulate another ~40 feet in hall. +2 for close chair follow and heavy min assist for balance and walker management. Pt demonstrating decreased safety awareness throughout OOB mobility.   Stairs            Wheelchair Mobility    Modified Rankin (Stroke Patients Only)       Balance Overall balance assessment: Needs assistance Sitting-balance support: Feet supported;Bilateral upper extremity supported Sitting balance-Leahy Scale: Fair     Standing balance support: Bilateral upper extremity supported Standing balance-Leahy Scale: Poor Standing balance comment: heavy reliance on RW; BLE knees flexed with mobility and knees dipping for near buckling.                             Pertinent Vitals/Pain Pain Assessment: Faces Pain Score: 5  Faces Pain Scale: Hurts little more Pain Location: low back Pain Descriptors / Indicators: Grimacing;Operative site guarding Pain Intervention(s): Limited activity  within patient's tolerance;Monitored during session;Repositioned    Home Living Family/patient expects to be discharged to:: Private residence Living Arrangements:  Spouse/significant other Available Help at Discharge: Family;Available 24 hours/day(just for supervision A, not for physical assist.) Type of Home: House Home Access: Stairs to enter Entrance Stairs-Rails: Right Entrance Stairs-Number of Steps: 5 Home Layout: One level Home Equipment: Walker - 2 wheels;Cane - single point;Bedside commode;Shower seat      Prior Function Level of Independence: Needs assistance   Gait / Transfers Assistance Needed: Reports SPC use. Pt reports recent OP PT for back.  ADL's / Homemaking Assistance Needed: Pt and spouse assist eachother per pt.        Hand Dominance   Dominant Hand: Right    Extremity/Trunk Assessment   Upper Extremity Assessment Upper Extremity Assessment: Generalized weakness    Lower Extremity Assessment Lower Extremity Assessment: Defer to PT evaluation;Generalized weakness;RLE deficits/detail;LLE deficits/detail RLE Deficits / Details: decreased strength LLE Deficits / Details: decreased strength    Cervical / Trunk Assessment Cervical / Trunk Assessment: Other exceptions Cervical / Trunk Exceptions: s/p lumbar sx  Communication   Communication: Expressive difficulties;Other (comment)(slow to respond)  Cognition Arousal/Alertness: Awake/alert Behavior During Therapy: WFL for tasks assessed/performed Overall Cognitive Status: No family/caregiver present to determine baseline cognitive functioning                                 General Comments: Pt with delayed processing (could be from pain) with long winded answers      General Comments General comments (skin integrity, edema, etc.): Pt unable to stand at sink for ADL and barely able to stand in hallway with RNs unattaching IV pole for ~30 seconds of static standing. Pt ambulating 20' with minA overall with RW and knees appear to be slightly flexed and fear of buckling,.     Exercises     Assessment/Plan    PT Assessment Patient needs continued PT  services  PT Problem List Decreased strength;Decreased activity tolerance;Decreased balance;Decreased mobility;Decreased knowledge of use of DME;Decreased safety awareness;Decreased knowledge of precautions;Pain       PT Treatment Interventions DME instruction;Gait training;Functional mobility training;Therapeutic activities;Therapeutic exercise;Neuromuscular re-education;Patient/family education    PT Goals (Current goals can be found in the Care Plan section)  Acute Rehab PT Goals Patient Stated Goal: to walk better PT Goal Formulation: With patient Time For Goal Achievement: 08/31/19 Potential to Achieve Goals: Good    Frequency Min 5X/week   Barriers to discharge Inaccessible home environment;Decreased caregiver support stairs to enter; wife cannot physically assist     Co-evaluation               AM-PAC PT "6 Clicks" Mobility  Outcome Measure Help needed turning from your back to your side while in a flat bed without using bedrails?: A Little Help needed moving from lying on your back to sitting on the side of a flat bed without using bedrails?: A Little Help needed moving to and from a bed to a chair (including a wheelchair)?: A Little Help needed standing up from a chair using your arms (e.g., wheelchair or bedside chair)?: A Little Help needed to walk in hospital room?: A Little Help needed climbing 3-5 steps with a railing? : Total 6 Click Score: 16    End of Session Equipment Utilized During Treatment: Gait belt Activity Tolerance: Patient limited by fatigue(weakness) Patient left: in chair;with call bell/phone within reach Nurse Communication: Mobility  status PT Visit Diagnosis: Unsteadiness on feet (R26.81);Pain;Muscle weakness (generalized) (M62.81);Difficulty in walking, not elsewhere classified (R26.2) Pain - part of body: (back)    Time: [id number] PT Time Calculation (min) (ACUTE ONLY): 22 min   Charges:   PT Evaluation $PT Eval Moderate Complexity:  1 Mod          Rolinda Roan, PT, DPT Acute Rehabilitation Services Pager: [phone] Office: [phone]   Thelma Comp 08/17/2019, 11:13 AM

## 2019-08-17 NOTE — Progress Notes (Signed)
Rehab Admissions Coordinator Note:  Patient was screened by Cleatrice Burke for appropriateness for an Inpatient Acute Rehab Consult per OT recs.  At this time, we are recommending HH or SNF if no caregiver support. Patient lacks the medical neccesity for an inpt hospital rehab admit.   Cleatrice Burke RN MSN 08/17/2019, 9:08 AM  I can be reached at [phone].

## 2019-08-17 NOTE — Progress Notes (Signed)
Neurosurgery Service Progress Note  Subjective: No acute events overnight, still having some leg symptoms, no new numbness or paresthesias   Objective: Vitals:   08/16/19 2246 08/16/19 2319 08/17/19 0343 08/17/19 0734  BP: 139/84 (!) 165/92 (!) 143/76 139/86  Pulse: 72 74 69 83  Resp: 20 18 18 18   Temp: 97.6 F (36.4 C) 97.9 F (36.6 C) 98.1 F (36.7 C) 97.6 F (36.4 C)  TempSrc:  Oral Oral Oral  SpO2: 99% 100% 95% 98%  Weight:      Height:       Temp (24hrs), Avg:97.8 F (36.6 C), Min:97.6 F (36.4 C), Max:98.1 F (36.7 C)  CBC Latest Ref Rng & Units 08/16/2019 03/29/2019 07/06/2018  WBC 4.0 - 10.5 K/uL 7.9 10.2 7.6  Hemoglobin 13.0 - 17.0 g/dL 16.6 16.7 16.0  Hematocrit 39.0 - 52.0 % 50.6 49.7 48.1  Platelets 150 - 400 K/uL 219 201.0 221.0   BMP Latest Ref Rng & Units 08/16/2019 03/29/2019 03/29/2019  Glucose 70 - 99 mg/dL 112(H) - 124(H)  BUN 8 - 23 mg/dL 11 - 15  Creatinine 0.61 - 1.24 mg/dL 0.78 - 0.76  BUN/Creat Ratio 10 - 24 - - -  Sodium 135 - 145 mmol/L 138 - 136  Potassium 3.5 - 5.1 mmol/L 4.2 - 4.0  Chloride 98 - 111 mmol/L 106 - 101  CO2 22 - 32 mmol/L 21(L) - 25  Calcium 8.9 - 10.3 mg/dL 9.7 10.1 9.7    Intake/Output Summary (Last 24 hours) at 08/17/2019 0749 Last data filed at 08/17/2019 0600 Gross per 24 hour  Intake 1200 ml  Output 725 ml  Net 475 ml    Current Facility-Administered Medications:  .  0.9 %  sodium chloride infusion, 250 mL, Intravenous, Continuous, Dmarco Baldus, Joyice Faster, MD .  acetaminophen (TYLENOL) tablet 650 mg, 650 mg, Oral, Q4H PRN, 650 mg at 08/17/19 0349 **OR** acetaminophen (TYLENOL) suppository 650 mg, 650 mg, Rectal, Q4H PRN, Lawanna Cecere A, MD .  brimonidine (ALPHAGAN) 0.2 % ophthalmic solution 1 drop, 1 drop, Both Eyes, BID, Chaunda Vandergriff A, MD .  cyclobenzaprine (FLEXERIL) tablet 10 mg, 10 mg, Oral, TID PRN, Judith Part, MD, 10 mg at 08/16/19 2354 .  docusate sodium (COLACE) capsule 100 mg, 100 mg, Oral,  BID, Judith Part, MD, 100 mg at 08/16/19 2340 .  fentaNYL (SUBLIMAZE) 100 MCG/2ML injection, , , ,  .  hydrochlorothiazide (MICROZIDE) capsule 12.5 mg, 12.5 mg, Oral, Daily, Jhase Creppel A, MD .  HYDROmorphone (DILAUDID) injection 1 mg, 1 mg, Intravenous, Q3H PRN, Judith Part, MD, 1 mg at 08/17/19 0044 .  lisinopril (ZESTRIL) tablet 20 mg, 20 mg, Oral, Daily, Aristide Waggle A, MD .  menthol-cetylpyridinium (CEPACOL) lozenge 3 mg, 1 lozenge, Oral, PRN **OR** phenol (CHLORASEPTIC) mouth spray 1 spray, 1 spray, Mouth/Throat, PRN, Darenda Fike A, MD .  ondansetron (ZOFRAN) tablet 4 mg, 4 mg, Oral, Q6H PRN **OR** ondansetron (ZOFRAN) injection 4 mg, 4 mg, Intravenous, Q6H PRN, Kandee Escalante A, MD .  oxyCODONE (Oxy IR/ROXICODONE) immediate release tablet 10 mg, 10 mg, Oral, Q4H PRN, Judith Part, MD, 10 mg at 08/17/19 0350 .  oxyCODONE (Oxy IR/ROXICODONE) immediate release tablet 5 mg, 5 mg, Oral, Q4H PRN, Otilio Groleau A, MD .  polyethylene glycol (MIRALAX / GLYCOLAX) packet 17 g, 17 g, Oral, Daily PRN, Karry Causer A, MD .  sodium chloride flush (NS) 0.9 % injection 3 mL, 3 mL, Intravenous, Q12H, Tafari Humiston, Joyice Faster, MD, 3 mL at 08/16/19  2337 .  sodium chloride flush (NS) 0.9 % injection 3 mL, 3 mL, Intravenous, PRN, Judith Part, MD .  tamsulosin (FLOMAX) capsule 0.4 mg, 0.4 mg, Oral, Daily, Alyssa Mancera A, MD, 0.4 mg at 08/17/19 0401 .  tamsulosin (FLOMAX) capsule 0.8 mg, 0.8 mg, Oral, Once, Paulena Servais A, MD .  timolol (TIMOPTIC) 0.5 % ophthalmic solution 1 drop, 1 drop, Right Eye, Daily, Kurstin Dimarzo, Joyice Faster, MD, 1 drop at 08/17/19 0043   Physical Exam: AOx3, PERRL, EOMI, FS, Strength 5/5 x4, SILTx4 Incision c/d/i  Assessment & Plan: 78 y.o. man s/p left L2-3 MIS microdiscectomy, recovering well.  -straight cath'd x1, flomax started, will need to void before discharge -discharge pending PT/OT eval, if cleared from their  perspective then will discharge home today if he's also voiding spontaneously -SCDs/TEDs, SQH on POD2 if stays in-house  Judith Part  08/17/19 7:49 AM

## 2019-08-18 DIAGNOSIS — M5116 Intervertebral disc disorders with radiculopathy, lumbar region: Secondary | ICD-10-CM | POA: Diagnosis present

## 2019-08-18 DIAGNOSIS — N400 Enlarged prostate without lower urinary tract symptoms: Secondary | ICD-10-CM | POA: Diagnosis present

## 2019-08-18 DIAGNOSIS — M48062 Spinal stenosis, lumbar region with neurogenic claudication: Secondary | ICD-10-CM | POA: Diagnosis present

## 2019-08-18 DIAGNOSIS — Z8249 Family history of ischemic heart disease and other diseases of the circulatory system: Secondary | ICD-10-CM | POA: Diagnosis not present

## 2019-08-18 DIAGNOSIS — I10 Essential (primary) hypertension: Secondary | ICD-10-CM | POA: Diagnosis present

## 2019-08-18 DIAGNOSIS — E785 Hyperlipidemia, unspecified: Secondary | ICD-10-CM | POA: Diagnosis present

## 2019-08-18 DIAGNOSIS — Z20828 Contact with and (suspected) exposure to other viral communicable diseases: Secondary | ICD-10-CM | POA: Diagnosis present

## 2019-08-18 NOTE — Progress Notes (Signed)
Physical Therapy Treatment Patient Details Name: Kirk [name] MRN: [id number] DOB: 22-Jan-1941 Today's Date: 08/18/2019    History of Present Illness Pt is a 78 yo male s/p L2-3 laminectomy and microdiscectomy. PMHx: HTN, congenital glaucoma with low vision, prostate CA, shingles, dysphagia.    PT Comments    Nursing requesting assistance to return pt to bed as pt has become a bit more confused, fatigued, in pain. Utilized steady lift for stand-pivot for safety. He required instructions repeated slowly and multiple times to understand task. Even with seat of recliner built-up/elevated and pt pulling up on bar of steady, he required +2 mod assist to stand. He had slower processing than earlier today.     Follow Up Recommendations  SNF;Supervision/Assistance - 24 hour     Equipment Recommendations  Rolling walker with 5" wheels    Recommendations for Other Services       Precautions / Restrictions Precautions Precautions: Fall;Back Precaution Booklet Issued: Yes (comment) Precaution Comments: Patient able to recall no bending; instructed in no twisting. Pt was cued for maintenance of precautions during functional mobility--especially upright posture, no flexion.  Required Braces or Orthoses: (no back brace) Restrictions Weight Bearing Restrictions: No    Mobility  Bed Mobility Overal bed mobility: Needs Assistance Bed Mobility: Sit to Sidelying         Sit to sidelying: Max assist General bed mobility comments: required repeated explanations/demonstrations (pt trying to move sit to supine) and finally max assist for sequencing and bringing legs up onto bed  Transfers Overall transfer level: Needs assistance Equipment used: Rolling walker (2 wheeled) Transfers: Sit to/from Stand Sit to Stand: Mod assist;+2 physical assistance;+2 safety/equipment;From elevated surface         General transfer comment: from recliner to stedy +59mod assist; from seat of steady min assist  of 2 (poor sequencing, assist for anterior wt-shift)  Ambulation/Gait Ambulation/Gait assistance: Min assist;+2 safety/equipment Gait Distance (Feet): 3 Feet(seated rest; 6 ft) Assistive device: Rolling walker (2 wheeled) Gait Pattern/deviations: Decreased stride length;Shuffle;Trunk flexed;Step-to pattern Gait velocity: Decreased   General Gait Details: Patient stood ~90 seconds to use urinal prior to walking and fatigued after multiple attempts to stand. Unable to fully extend his knees in standing with incr reliance on bil UE support via RW. After resting, was able to walk ~6 feet before knees began to give out and returned to sitting (using close follow with chair). Wife arrived mid-session and reports this may be a bit weaker than he was at home. He is not sure he is any different. Denies sensation or strength changes compared to 01/15/19.    Stairs             Wheelchair Mobility    Modified Rankin (Stroke Patients Only)       Balance Overall balance assessment: Needs assistance Sitting-balance support: Feet supported;Bilateral upper extremity supported Sitting balance-Leahy Scale: Fair     Standing balance support: Bilateral upper extremity supported Standing balance-Leahy Scale: Poor Standing balance comment: heavy reliance on RW; BLE knees flexed with mobility and knees dipping for near buckling.                            Cognition Arousal/Alertness: Awake/alert Behavior During Therapy: Restless;Impulsive Overall Cognitive Status: History of cognitive impairments - at baseline  General Comments: more difficulty attending to task and following instructions      Exercises      General Comments        Pertinent Vitals/Pain Pain Assessment: 0-10 Pain Score: 8  Pain Location: back, thighs(low back laying in bed) Pain Descriptors / Indicators: Grimacing;Operative site guarding Pain Intervention(s):  Limited activity within patient's tolerance;Monitored during session;Repositioned    Home Living                      Prior Function            PT Goals (current goals can now be found in the care plan section) Acute Rehab PT Goals Patient Stated Goal: to walk better Time For Goal Achievement: 08/31/19 Potential to Achieve Goals: Good Progress towards PT goals: Not progressing toward goals - comment(pain and fatigue limiting pt)    Frequency    Min 5X/week      PT Plan Current plan remains appropriate    Co-evaluation              AM-PAC PT "6 Clicks" Mobility   Outcome Measure  Help needed turning from your back to your side while in a flat bed without using bedrails?: A Lot Help needed moving from lying on your back to sitting on the side of a flat bed without using bedrails?: A Lot Help needed moving to and from a bed to a chair (including a wheelchair)?: A Lot Help needed standing up from a chair using your arms (e.g., wheelchair or bedside chair)?: A Lot Help needed to walk in hospital room?: A Lot Help needed climbing 3-5 steps with a railing? : Total 6 Click Score: 11    End of Session Equipment Utilized During Treatment: Gait belt Activity Tolerance: Patient limited by fatigue;Patient limited by pain(weakness) Patient left: with call bell/phone within reach;with family/visitor present;in bed;with nursing/sitter in room Nurse Communication: Other (comment)(RN assisting throughout ) PT Visit Diagnosis: Unsteadiness on feet (R26.81);Pain;Muscle weakness (generalized) (M62.81);Difficulty in walking, not elsewhere classified (R26.2) Pain - part of body: (back)     Time: [id number] PT Time Calculation (min) (ACUTE ONLY): 8 min  Charges:  $Gait Training: 23-37 mins $Therapeutic Activity: 8-22 mins                      Barry Brunner, PT Pager [phone]    Rexanne Mano 08/18/2019, 2:38 PM

## 2019-08-18 NOTE — NC FL2 (Signed)
[place] LEVEL OF CARE SCREENING TOOL     IDENTIFICATION  Patient Name: Kirk [name] Birthdate: Apr 17, 1941 Sex: male Admission Date (Current Location): 08/16/2019  [facility] and [place] Number:  Herbalist and Address:  [facility], [place] [address], [place], [place] [address]      Provider Number: [id number]  Attending Physician Name and Address:  Judith Part, MD  Relative Name and Phone Number:  Eilam Spiegel, spouse, [phone]    Current Level of Care: Hospital Recommended Level of Care: [place] Prior Approval Number:    Date Approved/Denied: 08/18/19 PASRR Number: [id number]  Discharge Plan: Home    Current Diagnoses: Patient Active Problem List   Diagnosis Date Noted  . Herniated lumbar intervertebral disc 08/16/2019  . 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 Acute right ankle pain 07/19/2018  . TSH elevation 02/25/2016  . Orthostatic hypotension 02/17/2016  . Syncope 01/07/2016  . 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 Bifascicular block 08/20/2015  . Neuropathy ([place]) 02/05/2015  . Diverticulosis of colon   . Vitamin D deficiency   . Hyperglycemia   . Dysphagia   . Diverticulum of esophagus, acquired   . Hyperlipidemia   . Glaucoma   . BPH (benign prostatic hyperplasia)   . Essential hypertension 02/01/2013  . 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    Orientation RESPIRATION BLADDER Height & Weight     Self, Time, Situation, Place  Normal Continent Weight: 188 lb (85.3 kg) Height:  6\' 1"  (185.4 cm)  BEHAVIORAL SYMPTOMS/MOOD NEUROLOGICAL BOWEL NUTRITION STATUS      Continent Diet  AMBULATORY STATUS COMMUNICATION OF NEEDS Skin   Extensive Assist Verbally Surgical wounds                       Personal Care Assistance Level of Assistance  Bathing Bathing Assistance: Limited assistance         Functional Limitations Info              SPECIAL CARE FACTORS FREQUENCY  PT (By licensed PT), OT (By licensed OT)     PT Frequency: 5x weekly OT Frequency: 5x weekly            Contractures      Additional Factors Info  Code Status, Allergies Code Status Info: Full Allergies Info: Reserpine, Sulfa antibiotics           Current Medications (08/18/2019):  This is the current hospital active medication list Current Facility-Administered Medications  Medication Dose Route Frequency Provider Last Rate Last Dose  . 0.9 %  sodium chloride infusion  250 mL Intravenous Continuous Judith Part, MD      . acetaminophen (TYLENOL) tablet 650 mg  650 mg Oral Q4H PRN Judith Part, MD   650 mg at 08/17/19 0349   Or  . acetaminophen (TYLENOL) suppository 650 mg  650 mg Rectal Q4H PRN Judith Part, MD      . brimonidine (ALPHAGAN) 0.2 % ophthalmic solution 1 drop  1 drop Both Eyes BID Judith Part, MD   1 drop at 08/18/19 [phone]  . cyclobenzaprine (FLEXERIL) tablet 10 mg  10 mg Oral TID PRN Judith Part, MD   10 mg at 08/17/19 1638  . docusate sodium (COLACE) capsule 100 mg  100 mg Oral BID Judith Part, MD   100 mg at 08/18/19 0937  . hydrochlorothiazide (MICROZIDE) capsule 12.5 mg  12.5  mg Oral Daily Judith Part, MD   12.5 mg at 08/18/19 [id number]  . HYDROmorphone (DILAUDID) injection 1 mg  1 mg Intravenous Q3H PRN Judith Part, MD   1 mg at 08/17/19 0044  . lisinopril (ZESTRIL) tablet 20 mg  20 mg Oral Daily Judith Part, MD   20 mg at 08/17/19 0932  . menthol-cetylpyridinium (CEPACOL) lozenge 3 mg  1 lozenge Oral PRN Judith Part, MD       Or  . phenol (CHLORASEPTIC) mouth spray 1 spray  1 spray Mouth/Throat PRN Judith Part, MD      . ondansetron (ZOFRAN) tablet 4 mg  4 mg Oral Q6H PRN Judith Part, MD       Or  . ondansetron (ZOFRAN) injection 4 mg  4 mg Intravenous Q6H PRN Judith Part, MD      . oxyCODONE (Oxy IR/ROXICODONE) immediate  release tablet 10 mg  10 mg Oral Q4H PRN Judith Part, MD   10 mg at 08/18/19 0524  . oxyCODONE (Oxy IR/ROXICODONE) immediate release tablet 5 mg  5 mg Oral Q4H PRN Judith Part, MD   5 mg at 08/17/19 1638  . polyethylene glycol (MIRALAX / GLYCOLAX) packet 17 g  17 g Oral Daily PRN Emelda Brothers A, MD      . sodium chloride flush (NS) 0.9 % injection 3 mL  3 mL Intravenous Q12H Judith Part, MD   3 mL at 08/18/19 0937  . sodium chloride flush (NS) 0.9 % injection 3 mL  3 mL Intravenous PRN Judith Part, MD      . tamsulosin (FLOMAX) capsule 0.4 mg  0.4 mg Oral Daily Judith Part, MD   0.4 mg at 08/18/19 0937  . timolol (TIMOPTIC) 0.5 % ophthalmic solution 1 drop  1 drop Right Eye Daily Judith Part, MD   1 drop at 08/18/19 [id number]     Discharge Medications: Please see discharge summary for a list of discharge medications.  Relevant Imaging Results:  Relevant Lab Results:   Additional [place], LCSW

## 2019-08-18 NOTE — TOC Initial Note (Signed)
Transition of Care [facility]) - Initial/Assessment Note    Patient Details  Name: Kirk [name] MRN: [id number] Date of Birth: 10-15-1940  Transition of Care [facility]) CM/SW Contact:    Oretha Milch, LCSW Phone Number: 08/18/2019, 9:49 AM  Clinical Narrative: CSW received consult for skilled nursing facility placement. [name] and assessed for skilled nursing facility placement  concerns. CSW notes patient reports that he is open for skilled nursing placement for inpatient rehabilitation as he was trying prior to having back surgery but needed to have had back injections. Patient was open to discussion of skilled nursing facility placement. CSW discussed community resources and options for support. CSW noted patient requested placement in the [place] area.  SNF: CSW informed patient and spouse of SNFs in the [place] ara that patient may qualify for with their medical needs and insurance coverage. CSW informed patient of the following possible barriers to placement: insurance authorization. CSW informed patient that as patient is under observation  status a list of SNF options have been provided. CSW will refer patient out to providers and will follow-up with patient regarding bed offers for SNF choice but notes current status in the hospital could be a potential barrier. . CSW inquired if patient or spouse had further questions at this time and noted no current questions or concerns.                      Expected Discharge Plan: Skilled Nursing Facility Barriers to Discharge: Insurance Authorization   Patient Goals and CMS Choice Patient states their goals for this hospitalization and ongoing recovery are:: "Get some therapy and go back home." CMS Medicare.gov Compare Post Acute Care list provided to:: Patient Choice offered to / list presented to : Patient  Expected Discharge Plan and Services Expected Discharge Plan: [place] In-house Referral: Clinical  Social Work   Post Acute Care Choice: [place] Living arrangements for the past 2 months: [place]                                      Prior Living Arrangements/Services Living arrangements for the past 2 months: Single Family Home Lives with:: Spouse Patient language and need for interpreter reviewed:: Yes Do you feel safe going back to the place where you live?: Yes      Need for Family Participation in Patient Care: No (Comment) Care giver support system in place?: No (comment)   Criminal Activity/Legal Involvement Pertinent to Current Situation/Hospitalization: No - Comment as needed  Activities of Daily Living Home Assistive Devices/Equipment: Walker (specify type), Eyeglasses, Blood pressure cuff ADL Screening (condition at time of admission) Patient's cognitive ability adequate to safely complete daily activities?: Yes Is the patient deaf or have difficulty hearing?: No Does the patient have difficulty seeing, even when wearing glasses/contacts?: Yes Does the patient have difficulty concentrating, remembering, or making decisions?: No Patient able to express need for assistance with ADLs?: Yes Does the patient have difficulty dressing or bathing?: No Independently performs ADLs?: Yes (appropriate for developmental age) Does the patient have difficulty walking or climbing stairs?: No Weakness of Legs: Both Weakness of Arms/Hands: None  Permission Sought/Granted Permission sought to share information with : Facility Sport and exercise psychologist, Family Supports Permission granted to share information with : Yes, Verbal Permission Granted  Share Information with NAME: Facility contacts, spouse  Emotional Assessment Appearance:: Appears stated age Attitude/Demeanor/Rapport: Charismatic Affect (typically observed): Calm Orientation: : Oriented to Self, Oriented to Place, Oriented to  Time, Oriented to Situation Alcohol / Substance  Use: Not Applicable Psych Involvement: No (comment)  Admission diagnosis:  Lumbar stenosis with neurogenic claudication Patient Active Problem List   Diagnosis Date Noted  . Herniated lumbar intervertebral disc 08/16/2019  . 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 Acute right ankle pain 07/19/2018  . TSH elevation 02/25/2016  . Orthostatic hypotension 02/17/2016  . Syncope 01/07/2016  . 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 Bifascicular block 08/20/2015  . Neuropathy ([place]) 02/05/2015  . Diverticulosis of colon   . Vitamin D deficiency   . Hyperglycemia   . Dysphagia   . Diverticulum of esophagus, acquired   . Hyperlipidemia   . Glaucoma   . BPH (benign prostatic hyperplasia)   . Essential hypertension 02/01/2013  . 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   PCP:  [name], MD Pharmacy:   [facility] [address] [place] [address] Phone: [phone] Fax: [phone]     Social Determinants of Health (SDOH) Interventions    Readmission Risk Interventions No flowsheet data found.

## 2019-08-18 NOTE — Progress Notes (Signed)
Physical Therapy Treatment Patient Details Name: Kirk [name] MRN: [id number] DOB: [birth year] Today's Date: 08/18/2019    History of Present Illness Pt is a 78 yo male s/p L2-3 laminectomy and microdiscectomy. PMHx: HTN, congenital glaucoma with low vision, prostate CA, shingles, dysphagia.    PT Comments    Patient up in chair on arrival. Visibly uncomfortable (shifting in chair, grimacing) however refusing pain medicine because "it doesn't do any good." Wanting to walk to decr pain and then re-consider pain meds (RN made aware of pt report). Patient requiring +2 mod assist to stand from recliner due to LE weakness and decr ability to shift weight anteriorly over his feet, bear weight, and then transition hands up to RW. Stood for 90 seconds to use urinal (one hand holding urinal and other on RW--anchored by PT and tech). Quickly fatigued and only walked 3 feet and 6 feet with seated rests in between. Pt denied decr in strength compared to 11/13 (when was able to walk 40 ft). Noted bil LE weakness is symmetrical and denies changes in sensation. RN made aware.     Follow Up Recommendations  SNF;Supervision/Assistance - 24 hour     Equipment Recommendations  Rolling walker with 5" wheels    Recommendations for Other Services       Precautions / Restrictions Precautions Precautions: Fall;Back Precaution Booklet Issued: Yes (comment) Precaution Comments: Patient able to recall no bending; instructed in no twisting. Pt was cued for maintenance of precautions during functional mobility--especially upright posture, no flexion.  Restrictions Weight Bearing Restrictions: No    Mobility  Bed Mobility               General bed mobility comments: pt up in chair (RN reported legs "give" when pivoted bed to chair  Transfers Overall transfer level: Needs assistance Equipment used: Rolling walker (2 wheeled) Transfers: Sit to/from Stand Sit to Stand: Mod assist;+2 physical  assistance;+2 safety/equipment;From elevated surface         General transfer comment: Pt in recliner; vc for set up. Patient did not want assist and attempted come to stand ~5x with lack of anterior wt-shift and knee extension to allow hands to transition from armrests to RW; ultimately required 2 person assist, RW anchored with pt using rt forearm/hand on RW for leverage. After initial sit to stand, added pillow in seat of chair to elevate. Stood an additional 2 times with continued need for max cues and +2 mod assist  Ambulation/Gait Ambulation/Gait assistance: Min assist;+2 safety/equipment Gait Distance (Feet): 3 Feet(seated rest; 6 ft) Assistive device: Rolling walker (2 wheeled) Gait Pattern/deviations: Decreased stride length;Shuffle;Trunk flexed;Step-to pattern Gait velocity: Decreased   General Gait Details: Patient stood ~90 seconds to use urinal prior to walking and fatigued after multiple attempts to stand. Unable to fully extend his knees in standing with incr reliance on bil UE support via RW. After resting, was able to walk ~6 feet before knees began to give out and returned to sitting (using close follow with chair). Wife arrived mid-session and reports this may be a bit weaker than he was at home. He is not sure he is any different. Denies sensation or strength changes compared to 01/15/19.    Stairs             Wheelchair Mobility    Modified Rankin (Stroke Patients Only)       Balance Overall balance assessment: Needs assistance Sitting-balance support: Feet supported;Bilateral upper extremity supported Sitting balance-Leahy Scale: Fair  Standing balance support: Bilateral upper extremity supported Standing balance-Leahy Scale: Poor Standing balance comment: heavy reliance on RW; BLE knees flexed with mobility and knees dipping for near buckling.                            Cognition Arousal/Alertness: Awake/alert Behavior During Therapy:  WFL for tasks assessed/performed Overall Cognitive Status: History of cognitive impairments - at baseline                                 General Comments: Pt with delayed processing (could be from pain) with long winded answers      Exercises      General Comments        Pertinent Vitals/Pain Pain Assessment: 0-10 Pain Score: 8  Pain Location: back, thighs(low back laying in bed) Pain Descriptors / Indicators: Grimacing;Operative site guarding Pain Intervention(s): Limited activity within patient's tolerance;Monitored during session;Repositioned;Other (comment)(pain meds encouraged)    Home Living                      Prior Function            PT Goals (current goals can now be found in the care plan section) Acute Rehab PT Goals Patient Stated Goal: to walk better Time For Goal Achievement: 08/31/19 Potential to Achieve Goals: Good Progress towards PT goals: Not progressing toward goals - comment(weaker today--? due to up in chair prior to arrival)    Frequency    Min 5X/week      PT Plan Current plan remains appropriate    Co-evaluation              AM-PAC PT "6 Clicks" Mobility   Outcome Measure  Help needed turning from your back to your side while in a flat bed without using bedrails?: A Little Help needed moving from lying on your back to sitting on the side of a flat bed without using bedrails?: A Little Help needed moving to and from a bed to a chair (including a wheelchair)?: A Lot Help needed standing up from a chair using your arms (e.g., wheelchair or bedside chair)?: A Lot Help needed to walk in hospital room?: A Lot Help needed climbing 3-5 steps with a railing? : Total 6 Click Score: 13    End of Session Equipment Utilized During Treatment: Gait belt Activity Tolerance: Patient limited by fatigue(weakness) Patient left: with call bell/phone within reach;with family/visitor present;in chair;with chair alarm  set Nurse Communication: Mobility status;Patient requests pain meds(? weaker than yesterday) PT Visit Diagnosis: Unsteadiness on feet (R26.81);Pain;Muscle weakness (generalized) (M62.81);Difficulty in walking, not elsewhere classified (R26.2) Pain - part of body: (back)     Time: 1032-1100 PT Time Calculation (min) (ACUTE ONLY): 28 min  Charges:  $Gait Training: 23-37 mins                      Barry Brunner, PT Pager [phone]    Rexanne Mano 08/18/2019, 11:30 AM

## 2019-08-18 NOTE — Progress Notes (Addendum)
   Providing Compassionate, Quality Care - Together   Subjective: Patient reports no issues overnight. Pain is well-controlled.  Objective: Vital signs in last 24 hours: Temp:  [97.5 F (36.4 C)-98.4 F (36.9 C)] 98.4 F (36.9 C) (11/14 0810) Pulse Rate:  [67-84] 74 (11/14 0810) Resp:  [16-18] 18 (11/14 0810) BP: (100-141)/(52-72) 100/52 (11/14 0810) SpO2:  [96 %-98 %] 96 % (11/14 0810)  Intake/Output from previous day: 11/13 0701 - 11/14 0700 In: 3 [I.V.:3] Out: -  Intake/Output this shift: No intake/output data recorded.  Alert and oriented x 4 CN II-XII grossly intact MAE, Strength and sensation intact Incision is clean, dry, and intact   Lab Results: Recent Labs    08/16/19 1315  WBC 7.9  HGB 16.6  HCT 50.6  PLT 219   BMET Recent Labs    08/16/19 1315  NA 138  K 4.2  CL 106  CO2 21*  GLUCOSE 112*  BUN 11  CREATININE 0.78  CALCIUM 9.7    Studies/Results: Dg Lumbar Spine 2-3 Views  Result Date: 08/16/2019 CLINICAL DATA:  L2-3 laminectomy EXAM: LUMBAR SPINE - 2-3 VIEW; DG C-ARM 1-60 MIN COMPARISON:  None. FLUOROSCOPY TIME:  Fluoroscopy Time:  17.8 seconds Radiation Exposure Index (if provided by the fluoroscopic device): Not available Number of Acquired Spot Images: 2 FINDINGS: Surgical retractor is noted posterior to the L2-3 interspace. The numbering nomenclature is similar to that seen on prior examination. IMPRESSION: Intraoperative localization at L2-3. Electronically Signed   By: Inez Catalina M.D.   On: 08/16/2019 22:39   Dg C-arm 1-60 Min  Result Date: 08/16/2019 CLINICAL DATA:  L2-3 laminectomy EXAM: LUMBAR SPINE - 2-3 VIEW; DG C-ARM 1-60 MIN COMPARISON:  None. FLUOROSCOPY TIME:  Fluoroscopy Time:  17.8 seconds Radiation Exposure Index (if provided by the fluoroscopic device): Not available Number of Acquired Spot Images: 2 FINDINGS: Surgical retractor is noted posterior to the L2-3 interspace. The numbering nomenclature is similar to that seen  on prior examination. IMPRESSION: Intraoperative localization at L2-3. Electronically Signed   By: Inez Catalina M.D.   On: 08/16/2019 22:39    Assessment/Plan: Patient is two days status post left L2-3 minimally invasive microdiscectomy. He has worked with physical and occupational therapies, who are both recommending SNF at this time.  -Continue to mobilize with therapies. -Therapies recommending SNF   Viona Gilmore, DNP, AGNP-C Nurse Practitioner  [facility] [place]. [address], [address], [place], [place] [address] P: [phone]    F: [phone]  08/18/2019, 9:12 AM

## 2019-08-19 NOTE — Progress Notes (Signed)
Physical Therapy Treatment Patient Details Name: Kirk [name] MRN: [id number] DOB: [birth year] Today's Date: 08/19/2019    History of Present Illness Pt is a 78 yo male s/p L2-3 laminectomy and microdiscectomy. PMHx: HTN, congenital glaucoma with low vision, prostate CA, shingles, dysphagia.    PT Comments    Patient attempting to stand on his own when wife calling for help. Difficult to reason with pt as he insisted he had to stand to urinate (with condom catheter in place). Assisted patient with sit to stand x 2; lateral scooting along EOB (he was seated at foot of bed on arrival); and bed mobility. ?more confused or just anxious due to needing to urinate and frustrated that he could not stand by himself?    Follow Up Recommendations  SNF;Supervision/Assistance - 24 hour     Equipment Recommendations  Other (comment)(TBD at next venue)    Recommendations for Other Services       Precautions / Restrictions Precautions Precautions: Fall;Back Precaution Booklet Issued: Yes (comment) Precaution Comments: Pt more confused, upset and could not recall Required Braces or Orthoses: (no back brace) Restrictions Weight Bearing Restrictions: No    Mobility  Bed Mobility Overal bed mobility: Needs Assistance Bed Mobility: Sit to Sidelying;Rolling Rolling: Min assist       Sit to sidelying: Max assist;+2 for safety/equipment General bed mobility comments: pt able to scoot in sitting toward HOB with max cues and min assist; again tries to lie back to supine vs sidelying and max assist to raise his legs and prevent twisting  Transfers Overall transfer level: Needs assistance Equipment used: Rolling walker (2 wheeled) Transfers: Sit to/from Stand Sit to Stand: Mod assist;From elevated surface         General transfer comment: from elevated bed; pt already in sitting and trying to stand; wife grabbed RW and pt able to stand with mod assist and cues for extending knees; he sat  abruptly back on bed x 1 "weak" and then insisted again he needed to pee and stood again. Second time stood for >60 seconds while holding RW and PT supporting Rt knee while RN adjusted bed linens  Ambulation/Gait             General Gait Details: Unsafe to attempt; pt anxious and moving towards agitation; not following commands well and wanting to do things "his way" Returned pt to bed with nursing assist   Stairs             Wheelchair Mobility    Modified Rankin (Stroke Patients Only)       Balance Overall balance assessment: Needs assistance Sitting-balance support: Feet supported;Bilateral upper extremity supported Sitting balance-Leahy Scale: Fair     Standing balance support: Bilateral upper extremity supported Standing balance-Leahy Scale: Poor Standing balance comment: heavy reliance on RW; BLE knees flexed with mobility and knees dipping for near buckling.                            Cognition Arousal/Alertness: Awake/alert Behavior During Therapy: Restless;Impulsive;Agitated Overall Cognitive Status: History of cognitive impairments - at baseline                                 General Comments: at EOB trying to stand "I need to pee!" without staff present with wife calling out for help; unable to reason with pt regarding condom catheter and assisted him  to stand; on return to bed delayed following commands      Exercises      General Comments        Pertinent Vitals/Pain Pain Assessment: Faces Faces Pain Scale: Hurts even more Pain Location: back, thighs(low back laying in bed) Pain Descriptors / Indicators: Grimacing;Operative site guarding Pain Intervention(s): Monitored during session;Repositioned    Home Living                      Prior Function            PT Goals (current goals can now be found in the care plan section) Acute Rehab PT Goals Patient Stated Goal: to walk better Time For Goal  Achievement: 08/31/19 Potential to Achieve Goals: Good Progress towards PT goals: Not progressing toward goals - comment(too confused/anxious/agitated this session)    Frequency    Min 5X/week      PT Plan Current plan remains appropriate    Co-evaluation              AM-PAC PT "6 Clicks" Mobility   Outcome Measure  Help needed turning from your back to your side while in a flat bed without using bedrails?: A Lot Help needed moving from lying on your back to sitting on the side of a flat bed without using bedrails?: A Lot Help needed moving to and from a bed to a chair (including a wheelchair)?: A Lot Help needed standing up from a chair using your arms (e.g., wheelchair or bedside chair)?: A Lot Help needed to walk in hospital room?: Total Help needed climbing 3-5 steps with a railing? : Total 6 Click Score: 10    End of Session   Activity Tolerance: Patient limited by pain;Treatment limited secondary to agitation(weakness) Patient left: with call bell/phone within reach;with family/visitor present;in bed;with nursing/sitter in room Nurse Communication: Other (comment)(RN arrived to assist after pt up standing with PT) PT Visit Diagnosis: Unsteadiness on feet (R26.81);Pain;Muscle weakness (generalized) (M62.81);Difficulty in walking, not elsewhere classified (R26.2) Pain - part of body: (back)     Time: [id number] PT Time Calculation (min) (ACUTE ONLY): 10 min  Charges:  $Therapeutic Activity: 8-22 mins                      Barry Brunner, PT Pager [phone]     Rexanne Mano 08/19/2019, 3:11 PM

## 2019-08-19 NOTE — Progress Notes (Signed)
   Providing Compassionate, Quality Care - Together   Subjective: Patient reports no issues overnight. Pain is still well-controlled.  Objective: Vital signs in last 24 hours: Temp:  [97.7 F (36.5 C)-98.4 F (36.9 C)] 98.1 F (36.7 C) (11/15 0821) Pulse Rate:  [71-85] 80 (11/15 0821) Resp:  [16-18] 18 (11/15 0821) BP: (102-133)/(56-77) 128/77 (11/15 0821) SpO2:  [94 %-99 %] 99 % (11/15 0821)  Intake/Output from previous day: 11/14 0701 - 11/15 0700 In: -  Out: 325 [Urine:325] Intake/Output this shift: No intake/output data recorded.  Alert and oriented x 4 CN II-XII grossly intact MAE, Strength and sensation intact Incision is clean, dry, and intact  Lab Results: Recent Labs    08/16/19 1315  WBC 7.9  HGB 16.6  HCT 50.6  PLT 219   BMET Recent Labs    08/16/19 1315  NA 138  K 4.2  CL 106  CO2 21*  GLUCOSE 112*  BUN 11  CREATININE 0.78  CALCIUM 9.7    Studies/Results: No results found.  Assessment/Plan: Patient is three days status post left L2-3 minimally invasive microdiscectomy. He has worked with physical and occupational therapies, who are both recommending SNF at this time.   -Continue to mobilize with therapies. -Therapies recommending SNF; CSW working on possible facilities   Viona Gilmore, [place], AGNP-C Nurse Practitioner  [facility] [place]. [address], [address], [place], [place] [address] P: [phone]    F: [phone]  08/19/2019, 8:53 AM

## 2019-08-19 NOTE — Plan of Care (Signed)
  Problem: Activity: Goal: Ability to avoid complications of mobility impairment will improve Outcome: Progressing Goal: Ability to tolerate increased activity will improve Outcome: Progressing Goal: Will remain Kirk [name] from falls Outcome: Progressing   Problem: Bowel/Gastric: Goal: Gastrointestinal status for postoperative course will improve Outcome: Progressing   Patient stable, discussed POC with patient and spouse, agreeable with plan for  PRN PO pain rx and laxative, denies question/concerns at this time.

## 2019-08-20 LAB — SARS CORONAVIRUS 2 (TAT 6-24 HRS): SARS Coronavirus 2: NEGATIVE

## 2019-08-20 MED ORDER — HEPARIN SODIUM (PORCINE) 5000 UNIT/ML IJ SOLN
5000.0000 [IU] | Freq: Three times a day (TID) | INTRAMUSCULAR | Status: DC
  Administered 2019-08-20 – 2019-08-21 (×2): 5000 [IU] via SUBCUTANEOUS
  Filled 2019-08-20 (×2): qty 1

## 2019-08-20 NOTE — TOC Progression Note (Signed)
Transition of Care [facility]) - Progression Note    Patient Details  Name: Kirk [name] MRN: [id number] Date of Birth: [birth year]  Transition of [facility]) CM/SW [place], [place] Phone Number: 08/20/2019, 4:14 PM  Clinical Narrative:    [facility] able to offer, still waiting on insurance approval. Pt wife to complete paperwork at [facility] tomorrow at [place] in process.   Expected Discharge Plan: Skilled Nursing Facility Barriers to Discharge: Insurance Authorization  Expected Discharge Plan and Services Expected Discharge Plan: [place] In-house Referral: Clinical Social Work   Post Acute Care Choice: [place] Living arrangements for the past 2 months: Single Family Home   Social Determinants of Health (SDOH) Interventions    Readmission Risk Interventions No flowsheet data found.

## 2019-08-20 NOTE — Progress Notes (Signed)
Neurosurgery Service Progress Note  Subjective: No acute events overnight, leg symptoms improving, no new complaints  Objective: Vitals:   08/19/19 2332 08/20/19 0438 08/20/19 0700 08/20/19 1155  BP: 136/71 118/73 138/80 117/76  Pulse: 80 76 78 78  Resp: 18 18 18    Temp: 98.9 F (37.2 C) (!) 97.3 F (36.3 C) 99.2 F (37.3 C) 98.6 F (37 C)  TempSrc: Oral Oral Oral Oral  SpO2: 100% 93% 96% 100%  Weight:      Height:       Temp (24hrs), Avg:98.7 F (37.1 C), Min:97.3 F (36.3 C), Max:99.2 F (37.3 C)  CBC Latest Ref Rng & Units 08/16/2019 03/29/2019 07/06/2018  WBC 4.0 - 10.5 K/uL 7.9 10.2 7.6  Hemoglobin 13.0 - 17.0 g/dL 16.6 16.7 16.0  Hematocrit 39.0 - 52.0 % 50.6 49.7 48.1  Platelets 150 - 400 K/uL 219 201.0 221.0   BMP Latest Ref Rng & Units 08/16/2019 03/29/2019 03/29/2019  Glucose 70 - 99 mg/dL 112(H) - 124(H)  BUN 8 - 23 mg/dL 11 - 15  Creatinine 0.61 - 1.24 mg/dL 0.78 - 0.76  BUN/Creat Ratio 10 - 24 - - -  Sodium 135 - 145 mmol/L 138 - 136  Potassium 3.5 - 5.1 mmol/L 4.2 - 4.0  Chloride 98 - 111 mmol/L 106 - 101  CO2 22 - 32 mmol/L 21(L) - 25  Calcium 8.9 - 10.3 mg/dL 9.7 10.1 9.7    Intake/Output Summary (Last 24 hours) at 08/20/2019 1423 Last data filed at 08/20/2019 0800 Gross per 24 hour  Intake 200 ml  Output 1500 ml  Net -1300 ml    Current Facility-Administered Medications:  .  0.9 %  sodium chloride infusion, 250 mL, Intravenous, Continuous, Georgetta Crafton A, MD .  acetaminophen (TYLENOL) tablet 650 mg, 650 mg, Oral, Q4H PRN, 650 mg at 08/17/19 0349 **OR** acetaminophen (TYLENOL) suppository 650 mg, 650 mg, Rectal, Q4H PRN, Jorene Kaylor A, MD .  brimonidine (ALPHAGAN) 0.2 % ophthalmic solution 1 drop, 1 drop, Both Eyes, BID, Elia Nunley, Joyice Faster, MD, 1 drop at 08/20/19 1000 .  cyclobenzaprine (FLEXERIL) tablet 10 mg, 10 mg, Oral, TID PRN, Judith Part, MD, 10 mg at 08/19/19 1449 .  docusate sodium (COLACE) capsule 100 mg, 100 mg, Oral,  BID, Kymoni Monday, Joyice Faster, MD, 100 mg at 08/20/19 1000 .  hydrochlorothiazide (MICROZIDE) capsule 12.5 mg, 12.5 mg, Oral, Daily, Judith Part, MD, 12.5 mg at 08/20/19 0958 .  HYDROmorphone (DILAUDID) injection 1 mg, 1 mg, Intravenous, Q3H PRN, Judith Part, MD, 1 mg at 08/17/19 0044 .  lisinopril (ZESTRIL) tablet 20 mg, 20 mg, Oral, Daily, Judith Part, MD, 20 mg at 08/20/19 0958 .  menthol-cetylpyridinium (CEPACOL) lozenge 3 mg, 1 lozenge, Oral, PRN **OR** phenol (CHLORASEPTIC) mouth spray 1 spray, 1 spray, Mouth/Throat, PRN, Emy Angevine A, MD .  ondansetron (ZOFRAN) tablet 4 mg, 4 mg, Oral, Q6H PRN **OR** ondansetron (ZOFRAN) injection 4 mg, 4 mg, Intravenous, Q6H PRN, Delanee Xin A, MD .  oxyCODONE (Oxy IR/ROXICODONE) immediate release tablet 10 mg, 10 mg, Oral, Q4H PRN, Judith Part, MD, 10 mg at 08/19/19 2326 .  oxyCODONE (Oxy IR/ROXICODONE) immediate release tablet 5 mg, 5 mg, Oral, Q4H PRN, Judith Part, MD, 5 mg at 08/20/19 1125 .  polyethylene glycol (MIRALAX / GLYCOLAX) packet 17 g, 17 g, Oral, Daily PRN, Judith Part, MD, 17 g at 08/19/19 1719 .  sodium chloride flush (NS) 0.9 % injection 3 mL, 3 mL, Intravenous, Q12H,  Judith Part, MD, 3 mL at 08/19/19 2001 .  sodium chloride flush (NS) 0.9 % injection 3 mL, 3 mL, Intravenous, PRN, Judith Part, MD .  tamsulosin (FLOMAX) capsule 0.4 mg, 0.4 mg, Oral, Daily, Haleema Vanderheyden, Joyice Faster, MD, 0.4 mg at 08/20/19 0958 .  timolol (TIMOPTIC) 0.5 % ophthalmic solution 1 drop, 1 drop, Right Eye, Daily, Jerusalem Wert, Joyice Faster, MD, 1 drop at 08/20/19 1000   Physical Exam: AOx3, PERRL, EOMI, FS, Strength 5/5 x4, SILTx4 Incision c/d/i  Assessment & Plan: 78 y.o. man s/p left L2-3 MIS microdiscectomy, recovering well.  -SNF discharge pending, okay for discharge when SNF bed available -SCDs/TEDs, SQH  Judith Part  08/20/19 2:23 PM

## 2019-08-20 NOTE — Progress Notes (Signed)
Physical Therapy Treatment Patient Details Name: Kirk [name] MRN: [id number] DOB: 01-08-1941 Today's Date: 08/20/2019    History of Present Illness Pt is a 78 yo male s/p L2-3 laminectomy and microdiscectomy. PMHx: HTN, congenital glaucoma with low vision, prostate CA, shingles, dysphagia.    PT Comments    Pt was still confused, restless, but no quite agitated.  Pt was able to answer question given time.  He followed basic instruction well, but didn't internalize education well.  Emphasized education to his wife, worked on transition to stand and progressing ambulation.    Follow Up Recommendations  SNF;Supervision/Assistance - 24 hour     Equipment Recommendations  Other (comment)(TBD next venue)    Recommendations for Other Services       Precautions / Restrictions Precautions Precautions: Fall;Back Precaution Booklet Issued: Yes (comment) Precaution Comments: pt still more restless than true agitation, but confused.    Mobility  Bed Mobility               General bed mobility comments: pt up in the chair on arrival  Transfers Overall transfer level: Needs assistance Equipment used: Rolling walker (2 wheeled) Transfers: Sit to/from Stand Sit to Stand: Mod assist;Max assist(from recliner height)         General transfer comment: cues for hand placement and to get appropriately to the edge of the chair.  Assist to come both forward and for boost.  Ambulation/Gait Ambulation/Gait assistance: Min assist;Mod assist;+2 safety/equipment(and for chair follow) Gait Distance (Feet): 10 Feet(then 20 feet with RW and variable assist) Assistive device: Rolling walker (2 wheeled) Gait Pattern/deviations: Step-to pattern;Step-through pattern;Decreased stride length Gait velocity: Decreased   General Gait Details: generally unsteady with flexed posture and knees during gait with ability to straighten knees in stance.  Moderately heavy use of the RW.   Stairs             Wheelchair Mobility    Modified Rankin (Stroke Patients Only)       Balance Overall balance assessment: Needs assistance   Sitting balance-Leahy Scale: Fair       Standing balance-Leahy Scale: Poor Standing balance comment: heavy reliance on RW; BLE knees flexed with mobility and knees dipping , but not about to buckle today                            Cognition Arousal/Alertness: Awake/alert Behavior During Therapy: Restless;Anxious Overall Cognitive Status: History of cognitive impairments - at baseline                                        Exercises      General Comments General comments (skin integrity, edema, etc.): pt/wife reinforced in back care/prec. and progression of activity from this point.      Pertinent Vitals/Pain Pain Assessment: Faces Faces Pain Scale: Hurts even more Pain Location: back, thighs Pain Descriptors / Indicators: Grimacing;Operative site guarding Pain Intervention(s): Monitored during session;Patient requesting pain meds-RN notified    Home Living                      Prior Function            PT Goals (current goals can now be found in the care plan section) Acute Rehab PT Goals Patient Stated Goal: to walk better PT Goal Formulation: With patient/family Time For  Goal Achievement: 08/31/19 Potential to Achieve Goals: Good Progress towards PT goals: Progressing toward goals    Frequency    Min 5X/week      PT Plan Current plan remains appropriate    Co-evaluation PT/OT/SLP Co-Evaluation/Treatment: Yes Reason for Co-Treatment: Complexity of the patient's impairments (multi-system involvement);For patient/therapist safety PT goals addressed during session: Mobility/safety with mobility OT goals addressed during session: Strengthening/ROM;ADL's and self-care      AM-PAC PT "6 Clicks" Mobility   Outcome Measure  Help needed turning from your back to your side while in a  flat bed without using bedrails?: A Lot Help needed moving from lying on your back to sitting on the side of a flat bed without using bedrails?: A Lot Help needed moving to and from a bed to a chair (including a wheelchair)?: A Lot Help needed standing up from a chair using your arms (e.g., wheelchair or bedside chair)?: A Lot Help needed to walk in hospital room?: A Lot Help needed climbing 3-5 steps with a railing? : Total 6 Click Score: 11    End of Session Equipment Utilized During Treatment: Gait belt Activity Tolerance: No increased pain Patient left: in chair;with call bell/phone within reach;with family/visitor present;with chair alarm set Nurse Communication: Mobility status PT Visit Diagnosis: Unsteadiness on feet (R26.81);Other abnormalities of gait and mobility (R26.89);Pain Pain - part of body: (back)     Time: [id number] PT Time Calculation (min) (ACUTE ONLY): 23 min  Charges:  $Gait Training: 8-22 mins                     08/20/2019  Kirk [name], PT Acute Rehabilitation Services [phone]  (pager) [phone]  (office)   Kirk [name] 08/20/2019, 12:09 PM

## 2019-08-20 NOTE — TOC Progression Note (Signed)
Transition of Care [facility]) - Progression Note    Patient Details  Name: Kirk [name] MRN: [id number] Date of Birth: [birth year]  Transition of Care [facility] CM/SW [place], [place] Phone Number: 08/20/2019, 12:35 PM  Clinical Narrative:    CSW was able to reach pt wife via telephone in room. Introduced self, role, reason for call- pt wife given offers and CMS ratings. She prefers [facility] as she called and "there was no cases and it is close to our house." CSW explained visitation guidelines and restrictions- pt and pt wife aware. Pt wife amenable to completing paperwork, CSW has reached out to [place] to schedule a time to complete. Pt will need new COVID prior to dc.    Expected Discharge Plan: Skilled Nursing Facility Barriers to Discharge: Insurance Authorization  Expected Discharge Plan and Services Expected Discharge Plan: [place] In-house Referral: Clinical Social Work   Post Acute Care Choice: [place] Living arrangements for the past 2 months: Single Family Home   Social Determinants of Health (SDOH) Interventions    Readmission Risk Interventions No flowsheet data found.

## 2019-08-20 NOTE — Social Work (Signed)
HIPAA compliant message left for pt wife Pamala Hurry at [phone]. Authorization initiated for pt through [employer].  Pt will need new COVID prior to discharge.   Westley Hummer, MSW, [place] Work [phone]

## 2019-08-20 NOTE — Progress Notes (Signed)
Occupational Therapy Treatment Patient Details Name: Kirk [name] MRN: [id number] DOB: [birth year] Today's Date: 08/20/2019    History of present illness Pt is a 78 yo male s/p L2-3 laminectomy and microdiscectomy. PMHx: HTN, congenital glaucoma with low vision, prostate CA, shingles, dysphagia.   OT comments  Pt remains confused, but easily redirected.  He is able to perform functional transfers with mod  A +2 and requires min - mod A for ADLs.  Pt is not a candidate for CIR, therefore discharge recommendation changed to SNF as wife unable to provide necessary level of assist.   Follow Up Recommendations  Supervision/Assistance - 24 hour;SNF    Equipment Recommendations  None recommended by OT    Recommendations for Other Services      Precautions / Restrictions Precautions Precautions: Fall;Back Precaution Booklet Issued: Yes (comment) Precaution Comments: pt  restless and confused.       Mobility Bed Mobility               General bed mobility comments: pt up in the chair on arrival  Transfers Overall transfer level: Needs assistance Equipment used: Rolling walker (2 wheeled) Transfers: Sit to/from Stand Sit to Stand: Mod assist;Max assist(from recliner height)         General transfer comment: cues for hand placement and to get appropriately to the edge of the chair.  Assist to come both forward and for boost.    Balance Overall balance assessment: Needs assistance Sitting-balance support: Feet supported;Bilateral upper extremity supported Sitting balance-Leahy Scale: Fair     Standing balance support: Bilateral upper extremity supported Standing balance-Leahy Scale: Poor Standing balance comment: heavy reliance on RW; BLE knees flexed with mobility and knees dipping , but not about to buckle today                           ADL either performed or assessed with clinical judgement   ADL Overall ADL's : Needs assistance/impaired                     Lower Body Dressing: Moderate assistance;Sit to/from stand Lower Body Dressing Details (indicate cue type and reason): able to perform figure 4  Toilet Transfer: +2 for safety/equipment;+2 for physical assistance;Ambulation;Comfort height toilet;Grab bars;RW;Moderate assistance   Toileting- Clothing Manipulation and Hygiene: Moderate assistance;Sit to/from stand       Functional mobility during ADLs: +2 for physical assistance;+2 for safety/equipment;Rolling walker;Moderate assistance       Vision       Perception     Praxis      Cognition Arousal/Alertness: Awake/alert Behavior During Therapy: Restless;Anxious Overall Cognitive Status: Impaired/Different from baseline Area of Impairment: Orientation;Attention;Memory;Following commands;Awareness;Problem solving;Safety/judgement                 Orientation Level: Disoriented to;Time Current Attention Level: Sustained Memory: Decreased short-term memory Following Commands: Follows one step commands consistently;Follows one step commands with increased time Safety/Judgement: Decreased awareness of safety Awareness: Intellectual Problem Solving: Slow processing;Difficulty sequencing;Requires verbal cues;Requires tactile cues          Exercises     Shoulder Instructions       General Comments pt/wife reinforced in back care/prec. and progression of activity from this point.    Pertinent Vitals/ Pain       Pain Assessment: Faces Faces Pain Scale: Hurts even more Pain Location: back, thighs Pain Descriptors / Indicators: Grimacing;Operative site guarding Pain Intervention(s): Monitored during session;Repositioned  Home  Living                                          Prior Functioning/Environment              Frequency  Min 2X/week        Progress Toward Goals  OT Goals(current goals can now be found in the care plan section)  Progress towards OT goals:  Progressing toward goals  Acute Rehab OT Goals Patient Stated Goal: to walk better  Plan Discharge plan needs to be updated    Co-evaluation    PT/OT/SLP Co-Evaluation/Treatment: Yes Reason for Co-Treatment: Necessary to address cognition/behavior during functional activity;For patient/therapist safety;To address functional/ADL transfers PT goals addressed during session: Mobility/safety with mobility OT goals addressed during session: ADL's and self-care      AM-PAC OT "6 Clicks" Daily Activity     Outcome Measure   Help from another person eating meals?: None Help from another person taking care of personal grooming?: A Little Help from another person toileting, which includes using toliet, bedpan, or urinal?: A Lot Help from another person bathing (including washing, rinsing, drying)?: A Lot Help from another person to put on and taking off regular upper body clothing?: A Little Help from another person to put on and taking off regular lower body clothing?: A Lot 6 Click Score: 16    End of Session Equipment Utilized During Treatment: Gait belt;Rolling walker  OT Visit Diagnosis: Unsteadiness on feet (R26.81);Muscle weakness (generalized) (M62.81)   Activity Tolerance Patient tolerated treatment well   Patient Left in chair;with call bell/phone within reach;with chair alarm set   Nurse Communication Mobility status        Time: [id number] OT Time Calculation (min): 23 min  Charges: OT General Charges $OT Visit: 1 Visit OT Treatments $Self Care/Home Management : 8-22 mins  Lucille Passy, OTR/L [place] Pager [phone] Office [phone]    Lucille Passy M 08/20/2019, 3:10 PM

## 2019-08-21 DIAGNOSIS — N400 Enlarged prostate without lower urinary tract symptoms: Secondary | ICD-10-CM | POA: Diagnosis not present

## 2019-08-21 DIAGNOSIS — M2550 Pain in unspecified joint: Secondary | ICD-10-CM | POA: Diagnosis not present

## 2019-08-21 DIAGNOSIS — E559 Vitamin D deficiency, unspecified: Secondary | ICD-10-CM | POA: Diagnosis not present

## 2019-08-21 DIAGNOSIS — R278 Other lack of coordination: Secondary | ICD-10-CM | POA: Diagnosis not present

## 2019-08-21 DIAGNOSIS — M48062 Spinal stenosis, lumbar region with neurogenic claudication: Secondary | ICD-10-CM | POA: Diagnosis not present

## 2019-08-21 DIAGNOSIS — Z9889 Other specified postprocedural states: Secondary | ICD-10-CM | POA: Diagnosis not present

## 2019-08-21 DIAGNOSIS — R2681 Unsteadiness on feet: Secondary | ICD-10-CM | POA: Diagnosis not present

## 2019-08-21 DIAGNOSIS — M5126 Other intervertebral disc displacement, lumbar region: Secondary | ICD-10-CM | POA: Diagnosis not present

## 2019-08-21 DIAGNOSIS — Z7401 Bed confinement status: Secondary | ICD-10-CM | POA: Diagnosis not present

## 2019-08-21 DIAGNOSIS — Z20828 Contact with and (suspected) exposure to other viral communicable diseases: Secondary | ICD-10-CM | POA: Diagnosis not present

## 2019-08-21 DIAGNOSIS — I10 Essential (primary) hypertension: Secondary | ICD-10-CM | POA: Diagnosis not present

## 2019-08-21 DIAGNOSIS — R2689 Other abnormalities of gait and mobility: Secondary | ICD-10-CM | POA: Diagnosis not present

## 2019-08-21 DIAGNOSIS — G629 Polyneuropathy, unspecified: Secondary | ICD-10-CM | POA: Diagnosis not present

## 2019-08-21 DIAGNOSIS — R52 Pain, unspecified: Secondary | ICD-10-CM | POA: Diagnosis not present

## 2019-08-21 DIAGNOSIS — M7070 Other bursitis of hip, unspecified hip: Secondary | ICD-10-CM | POA: Diagnosis not present

## 2019-08-21 DIAGNOSIS — R0902 Hypoxemia: Secondary | ICD-10-CM | POA: Diagnosis not present

## 2019-08-21 DIAGNOSIS — M48061 Spinal stenosis, lumbar region without neurogenic claudication: Secondary | ICD-10-CM | POA: Diagnosis not present

## 2019-08-21 DIAGNOSIS — M6281 Muscle weakness (generalized): Secondary | ICD-10-CM | POA: Diagnosis not present

## 2019-08-21 DIAGNOSIS — H4089 Other specified glaucoma: Secondary | ICD-10-CM | POA: Diagnosis not present

## 2019-08-21 DIAGNOSIS — R1311 Dysphagia, oral phase: Secondary | ICD-10-CM | POA: Diagnosis not present

## 2019-08-21 DIAGNOSIS — H409 Unspecified glaucoma: Secondary | ICD-10-CM | POA: Diagnosis not present

## 2019-08-21 MED ORDER — OXYCODONE HCL 5 MG PO TABS: 5.0000 mg | ORAL_TABLET | ORAL | 0 refills | Status: DC | PRN

## 2019-08-21 NOTE — Progress Notes (Signed)
Physical Therapy Treatment Patient Details Name: Kirk [name] MRN: [id number] DOB: 05-05-1941 Today's Date: 08/21/2019    History of Present Illness Pt is a 78 yo male s/p L2-3 laminectomy and microdiscectomy. PMHx: HTN, congenital glaucoma with low vision, prostate CA, shingles, dysphagia.    PT Comments    Pt still needing redirection.  Emphasis on transition to EOB, sit to stand and progressing ambulation in the halls.  Continued reinforcing the basic education to pt, but mostly to his wife.    Follow Up Recommendations  SNF;Supervision/Assistance - 24 hour     Equipment Recommendations  Other (comment)(TBA next venue)    Recommendations for Other Services       Precautions / Restrictions Precautions Precautions: Fall;Back Precaution Booklet Issued: Yes (comment)    Mobility  Bed Mobility Overal bed mobility: Needs Assistance Bed Mobility: Rolling;Sidelying to Sit;Sit to Sidelying Rolling: Min assist Sidelying to sit: Min assist       General bed mobility comments: cues to direct pt to roll vs pop straight up./  Transfers Overall transfer level: Needs assistance Equipment used: Rolling walker (2 wheeled) Transfers: Sit to/from Stand Sit to Stand: Mod assist;Max assist(dependent on height of the surface)         General transfer comment: consistent cues for hand placement and to get to edge of the surface to prep for standing  Ambulation/Gait Ambulation/Gait assistance: Min assist;+2 safety/equipment Gait Distance (Feet): 50 Feet(x2) Assistive device: Rolling walker (2 wheeled) Gait Pattern/deviations: Step-through pattern Gait velocity: Decreased Gait velocity interpretation: <1.8 ft/sec, indicate of risk for recurrent falls General Gait Details: knees flexed initially, but stance improved with time up.  pt unable to complete heel/toe pattern, but with short low amplitude, flat footed contact.   Stairs             Wheelchair Mobility     Modified Rankin (Stroke Patients Only)       Balance Overall balance assessment: Needs assistance Sitting-balance support: Feet supported;Bilateral upper extremity supported Sitting balance-Leahy Scale: Fair     Standing balance support: Bilateral upper extremity supported Standing balance-Leahy Scale: Poor Standing balance comment: heavy reliance on RW; BLE knees less flexed with mobility, but knees still dipping with prolonged stance,.                            Cognition Arousal/Alertness: Awake/alert Behavior During Therapy: Restless;Anxious Overall Cognitive Status: Impaired/Different from baseline(Not formally tested,  pt distractable)                               Problem Solving: Slow processing;Difficulty sequencing;Requires verbal cues;Requires tactile cues        Exercises      General Comments        Pertinent Vitals/Pain Pain Assessment: Faces Faces Pain Scale: Hurts even more Pain Location: back, thighs Pain Descriptors / Indicators: Grimacing;Operative site guarding Pain Intervention(s): Monitored during session;Patient requesting pain meds-RN notified    Home Living Family/patient expects to be discharged to:: Private residence Living Arrangements: Spouse/significant other                  Prior Function            PT Goals (current goals can now be found in the care plan section) Acute Rehab PT Goals PT Goal Formulation: With patient/family Time For Goal Achievement: 08/31/19 Potential to Achieve Goals: Good Progress towards PT  goals: Progressing toward goals    Frequency    Min 5X/week      PT Plan Current plan remains appropriate    Co-evaluation              AM-PAC PT "6 Clicks" Mobility   Outcome Measure  Help needed turning from your back to your side while in a flat bed without using bedrails?: A Little Help needed moving from lying on your back to sitting on the side of a flat bed  without using bedrails?: A Little Help needed moving to and from a bed to a chair (including a wheelchair)?: A Lot Help needed standing up from a chair using your arms (e.g., wheelchair or bedside chair)?: A Lot Help needed to walk in hospital room?: A Little Help needed climbing 3-5 steps with a railing? : Total 6 Click Score: 14    End of Session   Activity Tolerance: Patient tolerated treatment well;Patient limited by pain Patient left: in chair;with call bell/phone within reach;with family/visitor present;with chair alarm set   PT Visit Diagnosis: Unsteadiness on feet (R26.81);Other abnormalities of gait and mobility (R26.89);Pain Pain - part of body: (upper thighs, abdomen,  less at the back)     Time: [id number] PT Time Calculation (min) (ACUTE ONLY): 37 min  Charges:  $Gait Training: 8-22 mins $Therapeutic Activity: 8-22 mins                     08/21/2019  Donnella Sham, PT [place] [phone]  (pager) [phone]  (office)   Kirk [name] 08/21/2019, 3:10 PM

## 2019-08-21 NOTE — Progress Notes (Signed)
Report called to [facility] and left VM. Call was returned and report given. Awaiting [facility] transport. Reviewed AVS with patient and his wife. Glasses, clothing and eye drops going with patient, this also reported. Hard copy RX in packet, also reported. IV removed and condom cath removed. Simmie Davies RN

## 2019-08-21 NOTE — Progress Notes (Signed)
Neurosurgery Service Progress Note  Subjective: No acute events overnight, no new complaints  Objective: Vitals:   08/20/19 2004 08/20/19 2324 08/21/19 0405 08/21/19 0700  BP: (!) 162/89 (!) 142/76 (!) 140/93 (!) 152/72  Pulse: 96 85 84 67  Resp:    17  Temp: 98.4 F (36.9 C) 99.2 F (37.3 C) 98.3 F (36.8 C) 97.9 F (36.6 C)  TempSrc: Oral Oral Oral Oral  SpO2:   96% 95%  Weight:      Height:       Temp (24hrs), Avg:98.4 F (36.9 C), Min:97.9 F (36.6 C), Max:99.2 F (37.3 C)  CBC Latest Ref Rng & Units 08/16/2019 03/29/2019 07/06/2018  WBC 4.0 - 10.5 K/uL 7.9 10.2 7.6  Hemoglobin 13.0 - 17.0 g/dL 16.6 16.7 16.0  Hematocrit 39.0 - 52.0 % 50.6 49.7 48.1  Platelets 150 - 400 K/uL 219 201.0 221.0   BMP Latest Ref Rng & Units 08/16/2019 03/29/2019 03/29/2019  Glucose 70 - 99 mg/dL 112(H) - 124(H)  BUN 8 - 23 mg/dL 11 - 15  Creatinine 0.61 - 1.24 mg/dL 0.78 - 0.76  BUN/Creat Ratio 10 - 24 - - -  Sodium 135 - 145 mmol/L 138 - 136  Potassium 3.5 - 5.1 mmol/L 4.2 - 4.0  Chloride 98 - 111 mmol/L 106 - 101  CO2 22 - 32 mmol/L 21(L) - 25  Calcium 8.9 - 10.3 mg/dL 9.7 10.1 9.7    Intake/Output Summary (Last 24 hours) at 08/21/2019 1033 Last data filed at 08/21/2019 0700 Gross per 24 hour  Intake 400 ml  Output 1000 ml  Net -600 ml    Current Facility-Administered Medications:  .  0.9 %  sodium chloride infusion, 250 mL, Intravenous, Continuous, Janeva Peaster A, MD .  acetaminophen (TYLENOL) tablet 650 mg, 650 mg, Oral, Q4H PRN, 650 mg at 08/17/19 0349 **OR** acetaminophen (TYLENOL) suppository 650 mg, 650 mg, Rectal, Q4H PRN, Bisma Klett A, MD .  brimonidine (ALPHAGAN) 0.2 % ophthalmic solution 1 drop, 1 drop, Both Eyes, BID, Vibha Ferdig, Joyice Faster, MD, 1 drop at 08/21/19 1006 .  cyclobenzaprine (FLEXERIL) tablet 10 mg, 10 mg, Oral, TID PRN, Judith Part, MD, 10 mg at 08/19/19 1449 .  docusate sodium (COLACE) capsule 100 mg, 100 mg, Oral, BID, Falicia Lizotte  A, MD, 100 mg at 08/21/19 1005 .  heparin injection 5,000 Units, 5,000 Units, Subcutaneous, Q8H, Kyera Felan, Joyice Faster, MD, 5,000 Units at 08/21/19 [id number] .  hydrochlorothiazide (MICROZIDE) capsule 12.5 mg, 12.5 mg, Oral, Daily, Evonte Prestage A, MD, 12.5 mg at 08/21/19 1005 .  HYDROmorphone (DILAUDID) injection 1 mg, 1 mg, Intravenous, Q3H PRN, Judith Part, MD, 1 mg at 08/17/19 0044 .  lisinopril (ZESTRIL) tablet 20 mg, 20 mg, Oral, Daily, Najae Filsaime A, MD, 20 mg at 08/21/19 1005 .  menthol-cetylpyridinium (CEPACOL) lozenge 3 mg, 1 lozenge, Oral, PRN **OR** phenol (CHLORASEPTIC) mouth spray 1 spray, 1 spray, Mouth/Throat, PRN, Reilley Valentine A, MD .  ondansetron (ZOFRAN) tablet 4 mg, 4 mg, Oral, Q6H PRN **OR** ondansetron (ZOFRAN) injection 4 mg, 4 mg, Intravenous, Q6H PRN, Noraa Pickeral A, MD .  oxyCODONE (Oxy IR/ROXICODONE) immediate release tablet 10 mg, 10 mg, Oral, Q4H PRN, Judith Part, MD, 10 mg at 08/20/19 2300 .  oxyCODONE (Oxy IR/ROXICODONE) immediate release tablet 5 mg, 5 mg, Oral, Q4H PRN, Judith Part, MD, 5 mg at 08/20/19 1125 .  polyethylene glycol (MIRALAX / GLYCOLAX) packet 17 g, 17 g, Oral, Daily PRN, Judith Part, MD, [id number]  g at 08/19/19 1719 .  sodium chloride flush (NS) 0.9 % injection 3 mL, 3 mL, Intravenous, Q12H, Nashla Althoff, Joyice Faster, MD, 3 mL at 08/20/19 2119 .  sodium chloride flush (NS) 0.9 % injection 3 mL, 3 mL, Intravenous, PRN, Judith Part, MD .  tamsulosin (FLOMAX) capsule 0.4 mg, 0.4 mg, Oral, Daily, Jaina Morin A, MD, 0.4 mg at 08/21/19 1005 .  timolol (TIMOPTIC) 0.5 % ophthalmic solution 1 drop, 1 drop, Right Eye, Daily, Saniya Tranchina, Joyice Faster, MD, 1 drop at 08/21/19 1005   Physical Exam: AOx3, PERRL, EOMI, FS, Strength 5/5 x4, SILTx4 Incision c/d/i  Assessment & Plan: 78 y.o. man s/p left L2-3 MIS microdiscectomy, recovering well.  -SCDs/TEDs, SQH -discharge to SNF today  Judith Part   08/21/19 10:33 AM

## 2019-08-21 NOTE — Social Work (Signed)
Clinical Social Worker facilitated patient discharge including contacting patient family and facility to confirm patient discharge plans.  Clinical information faxed to facility and family agreeable with plan.  CSW arranged ambulance transport via [facility] to [facility] at 2pm.  RN to call [phone]  with report prior to discharge.  Clinical Social Worker will sign off for now as social work intervention is no longer needed. Please consult [place] again if new need arises.  Westley Hummer, MSW, [place] Social Worker [phone]

## 2019-08-21 NOTE — TOC Transition Note (Signed)
Transition of Care [facility]) - CM/SW Discharge Note   Patient Details  Name: Kirk [name] MRN: [id number] Date of Birth: 02-25-1941  Transition of Care [facility]) CM/SW Contact:  Alexander Mt, [place] Phone Number: 08/21/2019, 10:48 AM   Clinical Narrative:    Pt stable for discharge per MD, dc summary complete, sent to SNF. Paper script signed and on chart per MD. Pt wife to complete paperwork for SNF today, pt [facility] papers completed and sent to RN.    Final next level of care: Skilled Nursing Facility Barriers to Discharge: Barriers Resolved   Patient Goals and CMS Choice Patient states their goals for this hospitalization and ongoing recovery are:: "Get some therapy and go back home." CMS Medicare.gov Compare Post Acute Care list provided to:: Patient Choice offered to / list presented to : Patient  Discharge Placement       Patient chooses bed at: [facility] Patient to be transferred to facility by: [place] Name of family member notified: pt wife Kirk [name] Patient and family notified of of transfer: 08/21/19  Discharge Plan and Services In-house Referral: Clinical Social Work   Post Acute Care Choice: [place]                               Social Determinants of Health (SDOH) Interventions     Readmission Risk Interventions No flowsheet data found.

## 2019-08-22 DIAGNOSIS — H4089 Other specified glaucoma: Secondary | ICD-10-CM | POA: Diagnosis not present

## 2019-08-22 DIAGNOSIS — I10 Essential (primary) hypertension: Secondary | ICD-10-CM | POA: Diagnosis not present

## 2019-08-22 DIAGNOSIS — M48062 Spinal stenosis, lumbar region with neurogenic claudication: Secondary | ICD-10-CM | POA: Diagnosis not present

## 2019-08-22 DIAGNOSIS — N400 Enlarged prostate without lower urinary tract symptoms: Secondary | ICD-10-CM | POA: Diagnosis not present

## 2019-08-25 DIAGNOSIS — M48062 Spinal stenosis, lumbar region with neurogenic claudication: Secondary | ICD-10-CM | POA: Diagnosis not present

## 2019-08-25 DIAGNOSIS — I10 Essential (primary) hypertension: Secondary | ICD-10-CM | POA: Diagnosis not present

## 2019-08-25 DIAGNOSIS — N400 Enlarged prostate without lower urinary tract symptoms: Secondary | ICD-10-CM | POA: Diagnosis not present

## 2019-08-26 DIAGNOSIS — G629 Polyneuropathy, unspecified: Secondary | ICD-10-CM | POA: Diagnosis not present

## 2019-08-26 DIAGNOSIS — M48061 Spinal stenosis, lumbar region without neurogenic claudication: Secondary | ICD-10-CM | POA: Diagnosis not present

## 2019-08-26 DIAGNOSIS — Z9889 Other specified postprocedural states: Secondary | ICD-10-CM | POA: Diagnosis not present

## 2019-08-26 DIAGNOSIS — I10 Essential (primary) hypertension: Secondary | ICD-10-CM | POA: Diagnosis not present

## 2019-09-05 DIAGNOSIS — N400 Enlarged prostate without lower urinary tract symptoms: Secondary | ICD-10-CM | POA: Diagnosis not present

## 2019-09-05 DIAGNOSIS — M48062 Spinal stenosis, lumbar region with neurogenic claudication: Secondary | ICD-10-CM | POA: Diagnosis not present

## 2019-09-05 DIAGNOSIS — I10 Essential (primary) hypertension: Secondary | ICD-10-CM | POA: Diagnosis not present

## 2019-09-05 DIAGNOSIS — H4089 Other specified glaucoma: Secondary | ICD-10-CM | POA: Diagnosis not present

## 2019-09-08 DIAGNOSIS — M48062 Spinal stenosis, lumbar region with neurogenic claudication: Secondary | ICD-10-CM | POA: Diagnosis not present

## 2019-09-08 DIAGNOSIS — I10 Essential (primary) hypertension: Secondary | ICD-10-CM | POA: Diagnosis not present

## 2019-09-08 DIAGNOSIS — N400 Enlarged prostate without lower urinary tract symptoms: Secondary | ICD-10-CM | POA: Diagnosis not present

## 2019-09-12 DIAGNOSIS — N400 Enlarged prostate without lower urinary tract symptoms: Secondary | ICD-10-CM | POA: Diagnosis not present

## 2019-09-12 DIAGNOSIS — M48062 Spinal stenosis, lumbar region with neurogenic claudication: Secondary | ICD-10-CM | POA: Diagnosis not present

## 2019-09-12 DIAGNOSIS — H4089 Other specified glaucoma: Secondary | ICD-10-CM | POA: Diagnosis not present

## 2019-09-12 DIAGNOSIS — I10 Essential (primary) hypertension: Secondary | ICD-10-CM | POA: Diagnosis not present

## 2019-09-13 DIAGNOSIS — M7070 Other bursitis of hip, unspecified hip: Secondary | ICD-10-CM | POA: Diagnosis not present

## 2019-09-17 DIAGNOSIS — Z9181 History of falling: Secondary | ICD-10-CM | POA: Diagnosis not present

## 2019-09-17 DIAGNOSIS — R1311 Dysphagia, oral phase: Secondary | ICD-10-CM | POA: Diagnosis not present

## 2019-09-17 DIAGNOSIS — H409 Unspecified glaucoma: Secondary | ICD-10-CM | POA: Diagnosis not present

## 2019-09-17 DIAGNOSIS — M48061 Spinal stenosis, lumbar region without neurogenic claudication: Secondary | ICD-10-CM | POA: Diagnosis not present

## 2019-09-17 DIAGNOSIS — M5126 Other intervertebral disc displacement, lumbar region: Secondary | ICD-10-CM | POA: Diagnosis not present

## 2019-09-17 DIAGNOSIS — I10 Essential (primary) hypertension: Secondary | ICD-10-CM | POA: Diagnosis not present

## 2019-09-17 DIAGNOSIS — N400 Enlarged prostate without lower urinary tract symptoms: Secondary | ICD-10-CM | POA: Diagnosis not present

## 2019-09-17 DIAGNOSIS — E559 Vitamin D deficiency, unspecified: Secondary | ICD-10-CM | POA: Diagnosis not present

## 2019-09-24 DIAGNOSIS — M5441 Lumbago with sciatica, right side: Secondary | ICD-10-CM | POA: Diagnosis not present

## 2019-09-24 DIAGNOSIS — G8929 Other chronic pain: Secondary | ICD-10-CM | POA: Diagnosis not present

## 2019-09-24 DIAGNOSIS — M1651 Unilateral post-traumatic osteoarthritis, right hip: Secondary | ICD-10-CM | POA: Diagnosis not present

## 2019-09-24 DIAGNOSIS — M7061 Trochanteric bursitis, right hip: Secondary | ICD-10-CM | POA: Diagnosis not present

## 2019-10-01 DIAGNOSIS — I10 Essential (primary) hypertension: Secondary | ICD-10-CM | POA: Diagnosis not present

## 2019-10-01 DIAGNOSIS — E559 Vitamin D deficiency, unspecified: Secondary | ICD-10-CM | POA: Diagnosis not present

## 2019-10-01 DIAGNOSIS — N400 Enlarged prostate without lower urinary tract symptoms: Secondary | ICD-10-CM | POA: Diagnosis not present

## 2019-10-01 DIAGNOSIS — M5126 Other intervertebral disc displacement, lumbar region: Secondary | ICD-10-CM | POA: Diagnosis not present

## 2019-10-01 DIAGNOSIS — M48061 Spinal stenosis, lumbar region without neurogenic claudication: Secondary | ICD-10-CM | POA: Diagnosis not present

## 2019-10-01 DIAGNOSIS — H409 Unspecified glaucoma: Secondary | ICD-10-CM | POA: Diagnosis not present

## 2019-10-01 DIAGNOSIS — Z9181 History of falling: Secondary | ICD-10-CM | POA: Diagnosis not present

## 2019-10-01 DIAGNOSIS — R1311 Dysphagia, oral phase: Secondary | ICD-10-CM | POA: Diagnosis not present

## 2019-10-08 DIAGNOSIS — I10 Essential (primary) hypertension: Secondary | ICD-10-CM | POA: Diagnosis not present

## 2019-10-08 DIAGNOSIS — M48061 Spinal stenosis, lumbar region without neurogenic claudication: Secondary | ICD-10-CM | POA: Diagnosis not present

## 2019-10-08 DIAGNOSIS — M5126 Other intervertebral disc displacement, lumbar region: Secondary | ICD-10-CM | POA: Diagnosis not present

## 2019-10-08 DIAGNOSIS — Z9181 History of falling: Secondary | ICD-10-CM | POA: Diagnosis not present

## 2019-10-08 DIAGNOSIS — E559 Vitamin D deficiency, unspecified: Secondary | ICD-10-CM | POA: Diagnosis not present

## 2019-10-08 DIAGNOSIS — N400 Enlarged prostate without lower urinary tract symptoms: Secondary | ICD-10-CM | POA: Diagnosis not present

## 2019-10-08 DIAGNOSIS — H409 Unspecified glaucoma: Secondary | ICD-10-CM | POA: Diagnosis not present

## 2019-10-08 DIAGNOSIS — R1311 Dysphagia, oral phase: Secondary | ICD-10-CM | POA: Diagnosis not present

## 2019-10-17 DIAGNOSIS — M1651 Unilateral post-traumatic osteoarthritis, right hip: Secondary | ICD-10-CM | POA: Diagnosis not present

## 2019-10-23 DIAGNOSIS — M5126 Other intervertebral disc displacement, lumbar region: Secondary | ICD-10-CM | POA: Diagnosis not present

## 2019-10-23 DIAGNOSIS — E559 Vitamin D deficiency, unspecified: Secondary | ICD-10-CM | POA: Diagnosis not present

## 2019-10-23 DIAGNOSIS — I10 Essential (primary) hypertension: Secondary | ICD-10-CM | POA: Diagnosis not present

## 2019-10-23 DIAGNOSIS — Z9181 History of falling: Secondary | ICD-10-CM | POA: Diagnosis not present

## 2019-10-23 DIAGNOSIS — M48061 Spinal stenosis, lumbar region without neurogenic claudication: Secondary | ICD-10-CM | POA: Diagnosis not present

## 2019-10-23 DIAGNOSIS — H409 Unspecified glaucoma: Secondary | ICD-10-CM | POA: Diagnosis not present

## 2019-10-23 DIAGNOSIS — R1311 Dysphagia, oral phase: Secondary | ICD-10-CM | POA: Diagnosis not present

## 2019-10-23 DIAGNOSIS — N400 Enlarged prostate without lower urinary tract symptoms: Secondary | ICD-10-CM | POA: Diagnosis not present

## 2019-11-07 DIAGNOSIS — N400 Enlarged prostate without lower urinary tract symptoms: Secondary | ICD-10-CM | POA: Diagnosis not present

## 2019-11-07 DIAGNOSIS — I10 Essential (primary) hypertension: Secondary | ICD-10-CM | POA: Diagnosis not present

## 2019-11-07 DIAGNOSIS — R1311 Dysphagia, oral phase: Secondary | ICD-10-CM | POA: Diagnosis not present

## 2019-11-07 DIAGNOSIS — M5126 Other intervertebral disc displacement, lumbar region: Secondary | ICD-10-CM | POA: Diagnosis not present

## 2019-11-07 DIAGNOSIS — M48061 Spinal stenosis, lumbar region without neurogenic claudication: Secondary | ICD-10-CM | POA: Diagnosis not present

## 2019-11-07 DIAGNOSIS — H409 Unspecified glaucoma: Secondary | ICD-10-CM | POA: Diagnosis not present

## 2019-11-07 DIAGNOSIS — Z9181 History of falling: Secondary | ICD-10-CM | POA: Diagnosis not present

## 2019-11-07 DIAGNOSIS — E559 Vitamin D deficiency, unspecified: Secondary | ICD-10-CM | POA: Diagnosis not present

## 2019-11-08 DIAGNOSIS — H02834 Dermatochalasis of left upper eyelid: Secondary | ICD-10-CM | POA: Diagnosis not present

## 2019-11-08 DIAGNOSIS — H02831 Dermatochalasis of right upper eyelid: Secondary | ICD-10-CM | POA: Diagnosis not present

## 2019-11-08 DIAGNOSIS — H04123 Dry eye syndrome of bilateral lacrimal glands: Secondary | ICD-10-CM | POA: Diagnosis not present

## 2019-11-08 DIAGNOSIS — H02401 Unspecified ptosis of right eyelid: Secondary | ICD-10-CM | POA: Diagnosis not present

## 2019-11-13 DIAGNOSIS — Z9181 History of falling: Secondary | ICD-10-CM | POA: Diagnosis not present

## 2019-11-13 DIAGNOSIS — E559 Vitamin D deficiency, unspecified: Secondary | ICD-10-CM | POA: Diagnosis not present

## 2019-11-13 DIAGNOSIS — M5126 Other intervertebral disc displacement, lumbar region: Secondary | ICD-10-CM | POA: Diagnosis not present

## 2019-11-13 DIAGNOSIS — M48061 Spinal stenosis, lumbar region without neurogenic claudication: Secondary | ICD-10-CM | POA: Diagnosis not present

## 2019-11-13 DIAGNOSIS — N400 Enlarged prostate without lower urinary tract symptoms: Secondary | ICD-10-CM | POA: Diagnosis not present

## 2019-11-13 DIAGNOSIS — R1311 Dysphagia, oral phase: Secondary | ICD-10-CM | POA: Diagnosis not present

## 2019-11-13 DIAGNOSIS — H409 Unspecified glaucoma: Secondary | ICD-10-CM | POA: Diagnosis not present

## 2019-11-13 DIAGNOSIS — I10 Essential (primary) hypertension: Secondary | ICD-10-CM | POA: Diagnosis not present

## 2019-11-27 DIAGNOSIS — M48062 Spinal stenosis, lumbar region with neurogenic claudication: Secondary | ICD-10-CM | POA: Diagnosis not present

## 2019-11-27 DIAGNOSIS — H4089 Other specified glaucoma: Secondary | ICD-10-CM | POA: Diagnosis not present

## 2019-11-27 DIAGNOSIS — Z9181 History of falling: Secondary | ICD-10-CM | POA: Diagnosis not present

## 2019-11-27 DIAGNOSIS — M5126 Other intervertebral disc displacement, lumbar region: Secondary | ICD-10-CM | POA: Diagnosis not present

## 2019-11-27 DIAGNOSIS — E559 Vitamin D deficiency, unspecified: Secondary | ICD-10-CM | POA: Diagnosis not present

## 2019-11-27 DIAGNOSIS — R1311 Dysphagia, oral phase: Secondary | ICD-10-CM | POA: Diagnosis not present

## 2019-11-27 DIAGNOSIS — N400 Enlarged prostate without lower urinary tract symptoms: Secondary | ICD-10-CM | POA: Diagnosis not present

## 2019-11-27 DIAGNOSIS — I10 Essential (primary) hypertension: Secondary | ICD-10-CM | POA: Diagnosis not present

## 2019-11-28 DIAGNOSIS — M5416 Radiculopathy, lumbar region: Secondary | ICD-10-CM | POA: Diagnosis not present

## 2019-11-28 DIAGNOSIS — I10 Essential (primary) hypertension: Secondary | ICD-10-CM | POA: Diagnosis not present

## 2019-12-03 ENCOUNTER — Telehealth: Payer: Self-pay | Admitting: Family Medicine

## 2019-12-03 ENCOUNTER — Other Ambulatory Visit: Payer: Self-pay

## 2019-12-03 DIAGNOSIS — R350 Frequency of micturition: Secondary | ICD-10-CM

## 2019-12-03 MED ORDER — DULOXETINE HCL 20 MG PO CPEP: 20.0000 mg | ORAL_CAPSULE | Freq: Every day | ORAL | 0 refills | Status: DC

## 2019-12-03 NOTE — Telephone Encounter (Signed)
Patient had surgery in November 2020 on his back. Also states he has had some injections for his back pain. Is now being told by neurosurgeon that he needs "nerve shots." Is having a lot of pain in right leg and would like another opinion from Dr. Tamala Julian. Patient on schedule next Thursday. Patient asks if there is anything that he can take for pain. Is currently on oxycodone and IBU. Please advise.

## 2019-12-03 NOTE — Telephone Encounter (Signed)
I would consider adding a low dose cymbalta at 20 mg daily if he would like to help with the nerve pain

## 2019-12-03 NOTE — Telephone Encounter (Signed)
Patient's wife called stating that Kirk [name] is having a lot of pain in his right leg. He had surgery but I still experiencing pain. They scheduled an appointment for the first available (next Thursday) but asked if someone could all him to discuss this issue and see if there is anything he can do in the meantime.  Please advise.

## 2019-12-03 NOTE — Telephone Encounter (Signed)
Patient will try to come in this week for UA. Also mentions that "nerve shots" is a nerve conduction study and will speak with Dr. Tamala Julian further at his appointment to determine next steps for him.

## 2019-12-03 NOTE — Telephone Encounter (Signed)
We could order a UA and make sure he does not have an infection if he wants, then he can come in at his convenience and give a sample

## 2019-12-05 DIAGNOSIS — Z9181 History of falling: Secondary | ICD-10-CM | POA: Diagnosis not present

## 2019-12-05 DIAGNOSIS — R1311 Dysphagia, oral phase: Secondary | ICD-10-CM | POA: Diagnosis not present

## 2019-12-05 DIAGNOSIS — E559 Vitamin D deficiency, unspecified: Secondary | ICD-10-CM | POA: Diagnosis not present

## 2019-12-05 DIAGNOSIS — I10 Essential (primary) hypertension: Secondary | ICD-10-CM | POA: Diagnosis not present

## 2019-12-05 DIAGNOSIS — H4089 Other specified glaucoma: Secondary | ICD-10-CM | POA: Diagnosis not present

## 2019-12-05 DIAGNOSIS — M48062 Spinal stenosis, lumbar region with neurogenic claudication: Secondary | ICD-10-CM | POA: Diagnosis not present

## 2019-12-05 DIAGNOSIS — N400 Enlarged prostate without lower urinary tract symptoms: Secondary | ICD-10-CM | POA: Diagnosis not present

## 2019-12-05 DIAGNOSIS — M5126 Other intervertebral disc displacement, lumbar region: Secondary | ICD-10-CM | POA: Diagnosis not present

## 2019-12-13 ENCOUNTER — Other Ambulatory Visit: Payer: Self-pay

## 2019-12-13 ENCOUNTER — Ambulatory Visit (INDEPENDENT_AMBULATORY_CARE_PROVIDER_SITE_OTHER): Payer: PPO | Admitting: Family Medicine

## 2019-12-13 ENCOUNTER — Encounter: Payer: Self-pay | Admitting: Family Medicine

## 2019-12-13 ENCOUNTER — Other Ambulatory Visit: Payer: Self-pay | Admitting: *Deleted

## 2019-12-13 VITALS — BP 120/72 | HR 80 | Ht 73.0 in | Wt 182.0 lb

## 2019-12-13 DIAGNOSIS — M48061 Spinal stenosis, lumbar region without neurogenic claudication: Secondary | ICD-10-CM

## 2019-12-13 DIAGNOSIS — G629 Polyneuropathy, unspecified: Secondary | ICD-10-CM | POA: Diagnosis not present

## 2019-12-13 DIAGNOSIS — B351 Tinea unguium: Secondary | ICD-10-CM | POA: Diagnosis not present

## 2019-12-13 DIAGNOSIS — R350 Frequency of micturition: Secondary | ICD-10-CM

## 2019-12-13 DIAGNOSIS — L600 Ingrowing nail: Secondary | ICD-10-CM

## 2019-12-13 MED ORDER — AMOXICILLIN-POT CLAVULANATE 875-125 MG PO TABS: 1.0000 | ORAL_TABLET | Freq: Two times a day (BID) | ORAL | 0 refills | Status: DC

## 2019-12-13 NOTE — Patient Instructions (Addendum)
Augmenin 2x a day for 10 days Podiatry referral  Increase duloxetine to 2 pills daily, if helps send message and we will get new Rx See me in 4-6 weeks

## 2019-12-13 NOTE — Assessment & Plan Note (Signed)
Severe degenerative lumbar stump mild stenosis.  If continuing to have pain may need to consider the possibility of a fusion of the back.  Did recently have surgery not too long ago and would need to consider something else.  Patient is undergoing the potential for different nerve injections to help with potential radiofrequency ablation in some pain and otherwise.  Awaiting nerve conduction test which I do think is beneficial for this individual.  This will tell us how much is still radicular symptoms from the back versus the possibility of peripheral neuropathy that could be contributing.  Discussed potentially increasing duloxetine to 40 mg.  Total time with patient and reviewing imaging and operative notes greater than 40 minutes today.

## 2019-12-13 NOTE — Assessment & Plan Note (Signed)
Onychomycosis with mild avulsion of the nail noted today.  Referred to podiatry because I think will be beneficial for further evaluation.  Will likely need some intervention on the toenails long-term.  Likely peripheral neuropathy is contributing as well.  Patient put on a short course of Augmentin to be safe at the moment.  Follow-up again after nerve conduction test

## 2019-12-13 NOTE — Progress Notes (Signed)
[facility] [place] Phone: [phone] Subjective:   Kirk [name], am serving as a scribe for Dr. Hulan Saas. This visit occurred during the SARS-CoV-2 public health emergency.  Safety protocols were in place, including screening questions prior to the visit, additional usage of staff PPE, and extensive cleaning of exam room while observing appropriate contact time as indicated for disinfecting solutions.   I'm seeing this patient by the request  of:  [name], MD  CC: Right-sided leg pain follow-up  BPZ:WCHENIDPOE  Kirk [name] is a 79 y.o. male coming in with complaint of right leg pain that radiates down to the top of his right foot. Surgery November 2020 for his back. Patient states that his right leg has been bothering him since surgery. Feels weak and is unable to walk with cane and had to switch to walker after surgery. Physical therapy comes to the house once a week. Has had 2 epidurals recently that did not help to alleviate his pain. Is going to get an EMG on 12/27/2019.  Patient is wondering if he should have this done.  Patient has had an ABI of his lower extremities and that was independently visualized by me showing [name] significant arterial blood flow that is hampering the area. Patient recently did have his toenail get caught and is having some pain.  Wants to make sure that his large toe is not infected.        Past Medical History:  Diagnosis Date  . Cataract   . 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 Cough   . Disturbance of skin sensation   . Diverticulosis of colon 2014  . Diverticulum of esophagus, acquired   . Dysphagia, unspecified(787.20)   . Encounter for long-term (current) use of other medications   . Herpes zoster without mention of complication   . Hyperglycemia    patient denies  . Hyperlipidemia   . Hypertension   . Hypertrophy of prostate  without urinary obstruction and other lower urinary tract symptoms (LUTS)   . Lumbago   . Nevus, non-neoplastic   . Osteoarthrosis, unspecified whether generalized or localized, unspecified site    pt. denies  . Other premature beats   . Pain in limb   . Plantar fascial fibromatosis   . Special screening for malignant neoplasm of prostate   . Unspecified glaucoma(365.9)   . Unspecified pruritic disorder   . Unspecified vitamin D deficiency   . Urinary frequency   . Zenker's diverticulum    Past Surgical History:  Procedure Laterality Date  . CATARACT EXTRACTION BILATERAL W/ ANTERIOR VITRECTOMY Bilateral 1977  . COLONOSCOPY  09/06/2013   [name]  . DIRECT LARYNGOSCOPY  10/22/2015   Cervical esophagoscopy. Endoscopic esophageal diverticulotomy.   Marland Kitchen [place]  . GLAUCOMA SURGERY  1945   congenital glaucoma  . HERNIA REPAIR  1993  . [place]  2008  . LARYNGOSCOPY     with stapling of zenkers diverticulum  . LUMBAR LAMINECTOMY/ DECOMPRESSION WITH MET-RX Left 08/16/2019   Procedure: Left Lumbar Two-Three Minimally Invasive Laminectomy and Microdiscectomy;  Surgeon: Judith Part, MD;  Location: [facility];  Service: Neurosurgery;  Laterality: Left;  Left Lumbar 2-3 Minimally invasive laminectomy and microdiscectomy   Social History   Socioeconomic History  . Marital status: Married    Spouse name: Not on file  . Number of  children: Not on file  . Years of education: Not on file  . Highest education level: Not on file  Occupational History  . Not on file  Tobacco Use  . Smoking status: Never Smoker  . Smokeless tobacco: Never Used  Substance and Sexual Activity  . Alcohol use: [name]  . Drug use: [name]  . Sexual activity: Not on file  Other Topics Concern  . Not on file  Social History Narrative   Married   Lives at home with wife Pamala Hurry)   [name] regular exercise, but does his own yard work and household chores.   Never smoked   Alcohol none    Social Determinants of Health   Financial Resource Strain:   . Difficulty of Paying Living Expenses:   Food Insecurity:   . Worried About Charity fundraiser in the Last Year:   . Arboriculturist in the Last Year:   Transportation Needs:   . Film/video editor (Medical):   Marland Kitchen Lack of Transportation (Non-Medical):   Physical Activity:   . Days of Exercise per Week:   . Minutes of Exercise per Session:   Stress:   . Feeling of Stress :   Social Connections:   . Frequency of Communication with Friends and Family:   . Frequency of Social Gatherings with Friends and Family:   . Attends Religious Services:   . Active Member of Clubs or Organizations:   . Attends Archivist Meetings:   Marland Kitchen Marital Status:    Allergies  Allergen Reactions  . Reserpine Other (See Comments)    Unknown reaction  . Sulfa Antibiotics Other (See Comments)    Patient denies - Unknown reaction   Family History  Problem Relation Age of Onset  . Hypertension Mother   . Dementia Mother   . Kidney disease Father   . CAD Father   . Colon cancer Neg Hx   . Throat cancer Neg Hx   . Diabetes Neg Hx   . Liver disease Neg Hx      Current Outpatient Medications (Cardiovascular):  .  lisinopril-hydrochlorothiazide (ZESTORETIC) 20-12.5 MG tablet, Take 0.5 tablets by mouth daily.   Current Outpatient Medications (Analgesics):  .  acetaminophen (TYLENOL) 325 MG tablet, Take 650 mg by mouth every 6 (six) hours as needed (pain).  Marland Kitchen  oxyCODONE (OXY IR/ROXICODONE) 5 MG immediate release tablet, Take 1 tablet (5 mg total) by mouth every 4 (four) hours as needed (pain).   Current Outpatient Medications (Other):  .  brimonidine (ALPHAGAN) 0.2 % ophthalmic solution, Place 1 drop into both eyes 2 (two) times daily.  .  Cholecalciferol 25 MCG (1000 UT) tablet, Take 1,000 Units by mouth daily.  .  DULoxetine (CYMBALTA) 20 MG capsule, Take 1 capsule (20 mg total) by mouth daily. .  timolol (TIMOPTIC) 0.5  % ophthalmic solution, Place 1 drop into the right eye daily.  Marland Kitchen  amoxicillin-clavulanate (AUGMENTIN) 875-125 MG tablet, Take 1 tablet by mouth 2 (two) times daily.   Reviewed prior external information including notes and imaging from  primary care provider As well as notes that were available from care everywhere and other healthcare systems.  Past medical history, social, surgical and family history all reviewed in electronic medical record.  [name]   Review of Systems:  [name] headache, visual changes, nausea, vomiting, diarrhea, constipation, dizziness, abdominal pain, skin rash, fevers, chills, night sweats, weight loss, swollen lymph nodes, body  aches, joint swelling, chest pain, shortness of breath, mood changes. POSITIVE muscle aches  Objective  Blood pressure 120/72, pulse 80, height '6\' 1"'  (1.854 m), weight 182 lb (82.6 kg), SpO2 98 %.   General: [name] apparent distress alert and oriented x3 mood and affect normal, dressed appropriately.  HEENT: Pupils equal, extraocular movements intact cataracts and glaucoma noted Respiratory: Patient's speak in full sentences and does not appear short of breath  Cardiovascular: 1+ lower extremity edema, non tender, [name] erythema  Skin: Warm dry intact with [name] signs of infection or rash on extremities or on axial skeleton.  Abdomen: Soft nontender  Severely antalgic with a mild wide-based gait Foot exam right toe large toe shows the patient does have what appears to be a small avulsion of the nail done.  Patient does have onychomycosis of multiple toenails including the large toe.  Very mild erythema of the skin but does not appear that it is ingrown.  Seems to have more granulation tissue but questionable cellulitic changes noted in the area  Patient does have pitting edema of the ankles bilaterally.  Patient is seems neurovascularly intact.  Patient does have some pain with right sided  straight leg test.  Unable to do Corky Sox secondary to body habitus and tightness.   Impression and Recommendations:     This case required medical decision making of moderate complexity. The above documentation has been reviewed and is accurate and complete Lyndal Pulley, DO       Note: This dictation was prepared with Dragon dictation along with smaller phrase technology. Any transcriptional errors that result from this process are unintentional.

## 2019-12-14 ENCOUNTER — Other Ambulatory Visit (INDEPENDENT_AMBULATORY_CARE_PROVIDER_SITE_OTHER): Payer: PPO

## 2019-12-14 DIAGNOSIS — H4089 Other specified glaucoma: Secondary | ICD-10-CM | POA: Diagnosis not present

## 2019-12-14 DIAGNOSIS — I10 Essential (primary) hypertension: Secondary | ICD-10-CM | POA: Diagnosis not present

## 2019-12-14 DIAGNOSIS — R1311 Dysphagia, oral phase: Secondary | ICD-10-CM | POA: Diagnosis not present

## 2019-12-14 DIAGNOSIS — R350 Frequency of micturition: Secondary | ICD-10-CM | POA: Diagnosis not present

## 2019-12-14 DIAGNOSIS — E559 Vitamin D deficiency, unspecified: Secondary | ICD-10-CM | POA: Diagnosis not present

## 2019-12-14 DIAGNOSIS — N400 Enlarged prostate without lower urinary tract symptoms: Secondary | ICD-10-CM | POA: Diagnosis not present

## 2019-12-14 DIAGNOSIS — M48062 Spinal stenosis, lumbar region with neurogenic claudication: Secondary | ICD-10-CM | POA: Diagnosis not present

## 2019-12-14 DIAGNOSIS — M5126 Other intervertebral disc displacement, lumbar region: Secondary | ICD-10-CM | POA: Diagnosis not present

## 2019-12-14 DIAGNOSIS — Z9181 History of falling: Secondary | ICD-10-CM | POA: Diagnosis not present

## 2019-12-14 LAB — URINALYSIS
Bilirubin Urine: NEGATIVE
Hgb urine dipstick: NEGATIVE
Ketones, ur: NEGATIVE
Leukocytes,Ua: NEGATIVE
Nitrite: NEGATIVE
Specific Gravity, Urine: 1.01 (ref 1.000–1.030)
Total Protein, Urine: NEGATIVE
Urine Glucose: NEGATIVE
Urobilinogen, UA: 0.2 (ref 0.0–1.0)
pH: 6 (ref 5.0–8.0)

## 2019-12-14 NOTE — Addendum Note (Signed)
Addended by: Cresenciano Lick on: 12/14/2019 03:43 PM   Modules accepted: Orders

## 2019-12-17 DIAGNOSIS — Z9181 History of falling: Secondary | ICD-10-CM | POA: Diagnosis not present

## 2019-12-17 DIAGNOSIS — M48062 Spinal stenosis, lumbar region with neurogenic claudication: Secondary | ICD-10-CM | POA: Diagnosis not present

## 2019-12-17 DIAGNOSIS — N400 Enlarged prostate without lower urinary tract symptoms: Secondary | ICD-10-CM | POA: Diagnosis not present

## 2019-12-17 DIAGNOSIS — I10 Essential (primary) hypertension: Secondary | ICD-10-CM | POA: Diagnosis not present

## 2019-12-17 DIAGNOSIS — E559 Vitamin D deficiency, unspecified: Secondary | ICD-10-CM | POA: Diagnosis not present

## 2019-12-17 DIAGNOSIS — H4089 Other specified glaucoma: Secondary | ICD-10-CM | POA: Diagnosis not present

## 2019-12-17 DIAGNOSIS — M5126 Other intervertebral disc displacement, lumbar region: Secondary | ICD-10-CM | POA: Diagnosis not present

## 2019-12-17 DIAGNOSIS — R1311 Dysphagia, oral phase: Secondary | ICD-10-CM | POA: Diagnosis not present

## 2019-12-25 DIAGNOSIS — N400 Enlarged prostate without lower urinary tract symptoms: Secondary | ICD-10-CM | POA: Diagnosis not present

## 2019-12-25 DIAGNOSIS — I10 Essential (primary) hypertension: Secondary | ICD-10-CM | POA: Diagnosis not present

## 2019-12-25 DIAGNOSIS — Z9181 History of falling: Secondary | ICD-10-CM | POA: Diagnosis not present

## 2019-12-25 DIAGNOSIS — H4089 Other specified glaucoma: Secondary | ICD-10-CM | POA: Diagnosis not present

## 2019-12-25 DIAGNOSIS — M48062 Spinal stenosis, lumbar region with neurogenic claudication: Secondary | ICD-10-CM | POA: Diagnosis not present

## 2019-12-25 DIAGNOSIS — E559 Vitamin D deficiency, unspecified: Secondary | ICD-10-CM | POA: Diagnosis not present

## 2019-12-25 DIAGNOSIS — M5126 Other intervertebral disc displacement, lumbar region: Secondary | ICD-10-CM | POA: Diagnosis not present

## 2019-12-25 DIAGNOSIS — R1311 Dysphagia, oral phase: Secondary | ICD-10-CM | POA: Diagnosis not present

## 2019-12-27 DIAGNOSIS — M5416 Radiculopathy, lumbar region: Secondary | ICD-10-CM | POA: Diagnosis not present

## 2019-12-31 DIAGNOSIS — N400 Enlarged prostate without lower urinary tract symptoms: Secondary | ICD-10-CM | POA: Diagnosis not present

## 2019-12-31 DIAGNOSIS — Z9181 History of falling: Secondary | ICD-10-CM | POA: Diagnosis not present

## 2019-12-31 DIAGNOSIS — M48062 Spinal stenosis, lumbar region with neurogenic claudication: Secondary | ICD-10-CM | POA: Diagnosis not present

## 2019-12-31 DIAGNOSIS — E559 Vitamin D deficiency, unspecified: Secondary | ICD-10-CM | POA: Diagnosis not present

## 2019-12-31 DIAGNOSIS — I10 Essential (primary) hypertension: Secondary | ICD-10-CM | POA: Diagnosis not present

## 2019-12-31 DIAGNOSIS — R1311 Dysphagia, oral phase: Secondary | ICD-10-CM | POA: Diagnosis not present

## 2019-12-31 DIAGNOSIS — M5126 Other intervertebral disc displacement, lumbar region: Secondary | ICD-10-CM | POA: Diagnosis not present

## 2019-12-31 DIAGNOSIS — H4089 Other specified glaucoma: Secondary | ICD-10-CM | POA: Diagnosis not present

## 2020-01-08 ENCOUNTER — Encounter: Payer: Self-pay | Admitting: Family Medicine

## 2020-01-08 ENCOUNTER — Other Ambulatory Visit: Payer: Self-pay

## 2020-01-08 ENCOUNTER — Ambulatory Visit: Payer: PPO | Admitting: Family Medicine

## 2020-01-08 DIAGNOSIS — M5126 Other intervertebral disc displacement, lumbar region: Secondary | ICD-10-CM

## 2020-01-08 NOTE — Patient Instructions (Signed)
See me in 8 weeks

## 2020-01-08 NOTE — Progress Notes (Signed)
[place] Phone: [phone] Subjective:   [name], am serving as a scribe for Dr. Hulan Saas. This visit occurred during the SARS-CoV-2 public health emergency.  Safety protocols were in place, including screening questions prior to the visit, additional usage of staff PPE, and extensive cleaning of exam room while observing appropriate contact time as indicated for disinfecting solutions.    I'm seeing this patient by the request  of:  [name], MD  CC: Low back and leg pain follow-up  [id number]   12/13/2019 Severe degenerative lumbar stump mild stenosis.  If continuing to have pain may need to consider the possibility of a fusion of the back.  Did recently have surgery not too long ago and would need to consider something else.  Patient is undergoing the potential for different nerve injections to help with potential radiofrequency ablation in some pain and otherwise.  Awaiting nerve conduction test which I do think is beneficial for this individual.  This will tell us how much is still radicular symptoms from the back versus the possibility of peripheral neuropathy that could be contributing.  Discussed potentially increasing duloxetine to 40 mg.  Total time with patient and reviewing imaging and operative notes greater than 40 minutes today.  Update 01/08/2020 ERCIL CASSIS is a 79 y.o. male coming in with complaint of back pain. Feels the same as last visit. Discontinued Cymbalta 43m. Using Advil for pain.  Patient felt like the high dose of the Cymbalta he was unable to tolerate.  Decided then to discontinue all of it.  Patient has been seen another provider for the back at this time.  Was to be worked up with a nerve conduction study which patient did have 2 weeks ago but has not gotten the results.  If we do not have any of the results either.  Patient is frustrated as well as wife frustrated with  not having any true improvement at the moment.     Past Medical History:  Diagnosis Date  . Cataract   . Congenital glaucoma    "both eyes operated on when I was 14 monthsold"  . Cough   . Disturbance of skin sensation   . Diverticulosis of colon 2014  . Diverticulum of esophagus, acquired   . Dysphagia, unspecified(787.20)   . Encounter for long-term (current) use of other medications   . Herpes zoster without mention of complication   . Hyperglycemia    patient denies  . Hyperlipidemia   . Hypertension   . 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 Lumbago   . Nevus, non-neoplastic   . Osteoarthrosis, unspecified whether generalized or localized, unspecified site    pt. denies  . Other premature beats   . Pain in limb   . Plantar fascial fibromatosis   . Special screening for malignant neoplasm of prostate   . Unspecified glaucoma(365.9)   . Unspecified pruritic disorder   . Unspecified vitamin D deficiency   . Urinary frequency   . Zenker's diverticulum    Past Surgical History:  Procedure Laterality Date  . CATARACT EXTRACTION BILATERAL W/ ANTERIOR VITRECTOMY Bilateral 1977  . COLONOSCOPY  09/06/2013   PHenrene Pastor . DIRECT LARYNGOSCOPY  10/22/2015   Cervical esophagoscopy. Endoscopic esophageal diverticulotomy.   [place]  [place]   congenital glaucoma  . HERNIA REPAIR  [place]  2008  . LARYNGOSCOPY     with stapling of zenkers diverticulum  . LUMBAR LAMINECTOMY/ DECOMPRESSION WITH MET-RX Left 08/16/2019   Procedure: Left Lumbar Two-Three Minimally Invasive Laminectomy and Microdiscectomy;  Surgeon: Judith Part, MD;  Location: [place] [facility];  Service: Neurosurgery;  Laterality: Left;  Left Lumbar 2-3 Minimally invasive laminectomy and microdiscectomy   Social History   Socioeconomic History  . Marital status: Married    Spouse name: Not on  file  . Number of children: Not on file  . Years of education: Not on file  . Highest education level: Not on file  Occupational History  . Not on file  Tobacco Use  . Smoking status: Never Smoker  . Smokeless tobacco: Never Used  Substance and Sexual Activity  . Alcohol use: No  . Drug use: No  . Sexual activity: Not on file  Other Topics Concern  . Not on file  Social History Narrative   Married   Lives at home with wife Pamala Hurry)   No regular exercise, but does his own yard work and household chores.   Never smoked   Alcohol none   Social Determinants of Health   Financial Resource Strain:   . Difficulty of Paying Living Expenses:   Food Insecurity:   . Worried About Charity fundraiser in the Last Year:   . Arboriculturist in the Last Year:   Transportation Needs:   . Film/video editor (Medical):   Marland Kitchen Lack of Transportation (Non-Medical):   Physical Activity:   . Days of Exercise per Week:   . Minutes of Exercise per Session:   Stress:   . Feeling of Stress :   Social Connections:   . Frequency of Communication with Friends and Family:   . Frequency of Social Gatherings with Friends and Family:   . Attends Religious Services:   . Active Member of Clubs or Organizations:   . Attends Archivist Meetings:   Marland Kitchen Marital Status:    Allergies  Allergen Reactions  . Reserpine Other (See Comments)    Unknown reaction  . Sulfa Antibiotics Other (See Comments)    Patient denies - Unknown reaction   Family History  Problem Relation Age of Onset  . Hypertension Mother   . Dementia Mother   . Kidney disease Father   . CAD Father   . Colon cancer Neg Hx   . Throat cancer Neg Hx   . Diabetes Neg Hx   . Liver disease Neg Hx      Current Outpatient Medications (Cardiovascular):  .  lisinopril-hydrochlorothiazide (ZESTORETIC) 20-12.5 MG tablet, Take 0.5 tablets by mouth daily.   Current Outpatient Medications (Analgesics):  .  acetaminophen  (TYLENOL) 325 MG tablet, Take 650 mg by mouth every 6 (six) hours as needed (pain).  Marland Kitchen  oxyCODONE (OXY IR/ROXICODONE) 5 MG immediate release tablet, Take 1 tablet (5 mg total) by mouth every 4 (four) hours as needed (pain).   Current Outpatient Medications (Other):  .  amoxicillin-clavulanate (AUGMENTIN) 875-125 MG tablet, Take 1 tablet by mouth 2 (two) times daily. .  brimonidine (ALPHAGAN) 0.2 % ophthalmic solution, Place 1 drop into both eyes 2 (two) times daily.  .  Cholecalciferol 25 MCG (1000 UT) tablet, Take 1,000 Units by mouth daily.  .  DULoxetine (CYMBALTA) 20 MG capsule, Take 1 capsule (20 mg total) by mouth daily. .  timolol (TIMOPTIC) 0.5 % ophthalmic solution, Place 1 drop  into the right eye daily.    Reviewed prior external information including notes and imaging from  primary care provider As well as notes that were available from care everywhere and other healthcare systems.  Past medical history, social, surgical and family history all reviewed in electronic medical record.  No pertanent information unless stated regarding to the chief complaint.   Review of Systems:  No headache, visual changes, nausea, vomiting, diarrhea, constipation, dizziness, abdominal pain, skin rash, fevers, chills, night sweats, weight loss, swollen lymph nodes, , joint swelling, chest pain, shortness of breath, mood changes. POSITIVE muscle aches, body aches, unsteadiness on his feet  Objective  Blood pressure 124/82, pulse 76, height '6\' 1"'  (1.854 m), weight 184 lb (83.5 kg), SpO2 97 %.   General: No apparent distress alert and oriented x3 mood and affect normal, dressed appropriately.  HEENT: Pupils equal, extraocular movements intact  Respiratory: Patient's speak in full sentences and does not appear short of breath  Cardiovascular: No lower extremity edema, non tender, no erythema  Neuro: Cranial nerves II through XII are intact, neurovascularly intact in all extremities with 2+ DTRs and 2+  pulses.  Gait severely antalgic Patient's back exam does have some diffuse tenderness noted but nothing significant.  Patient does have significant tightness with straight leg test.  Patient does have 4-5 strength in lower extremities but seems to be symmetric.  Does have some numbness distally bilaterally of the feet near the ankles bilaterally.   Impression and Recommendations:     This case required medical decision making of moderate complexity. The above documentation has been reviewed and is accurate and complete Lyndal Pulley, DO       Note: This dictation was prepared with Dragon dictation along with smaller phrase technology. Any transcriptional errors that result from this process are unintentional.

## 2020-01-08 NOTE — Assessment & Plan Note (Signed)
Continues to have significant trouble.  The patient's discussed with wife as well as reviewing chart for over 45 minutes today.  Do not have the most recent update and evaluation secondary to the nerve conduction study study that would tell us if it is more peripheral neuropathy versus more of a lumbar radiculopathy.  Encouraged her to continue to follow-up with the neurosurgeons because I do think it would be more beneficial to talk about the radiofrequency ablation and if this is a potential or post potential need surgical intervention.  Patient has been fairly noncompliant with most of the medications including the Cymbalta but that was making any difference but then had difficulty with the higher dose.  Discussed and tried to answer as many questions as possible.  Given a note for formal physical therapy to continue to increase activity follow-up again 8 weeks

## 2020-01-11 DIAGNOSIS — M5416 Radiculopathy, lumbar region: Secondary | ICD-10-CM | POA: Diagnosis not present

## 2020-01-17 ENCOUNTER — Ambulatory Visit: Payer: PPO | Admitting: Family Medicine

## 2020-01-19 DIAGNOSIS — K5730 Diverticulosis of large intestine without perforation or abscess without bleeding: Secondary | ICD-10-CM | POA: Diagnosis not present

## 2020-01-19 DIAGNOSIS — I951 Orthostatic hypotension: Secondary | ICD-10-CM | POA: Diagnosis not present

## 2020-01-19 DIAGNOSIS — R1310 Dysphagia, unspecified: Secondary | ICD-10-CM | POA: Diagnosis not present

## 2020-01-19 DIAGNOSIS — M5126 Other intervertebral disc displacement, lumbar region: Secondary | ICD-10-CM | POA: Diagnosis not present

## 2020-01-19 DIAGNOSIS — E785 Hyperlipidemia, unspecified: Secondary | ICD-10-CM | POA: Diagnosis not present

## 2020-01-19 DIAGNOSIS — H4089 Other specified glaucoma: Secondary | ICD-10-CM | POA: Diagnosis not present

## 2020-01-19 DIAGNOSIS — E559 Vitamin D deficiency, unspecified: Secondary | ICD-10-CM | POA: Diagnosis not present

## 2020-01-19 DIAGNOSIS — M1990 Unspecified osteoarthritis, unspecified site: Secondary | ICD-10-CM | POA: Diagnosis not present

## 2020-01-19 DIAGNOSIS — M48062 Spinal stenosis, lumbar region with neurogenic claudication: Secondary | ICD-10-CM | POA: Diagnosis not present

## 2020-01-19 DIAGNOSIS — I10 Essential (primary) hypertension: Secondary | ICD-10-CM | POA: Diagnosis not present

## 2020-01-19 DIAGNOSIS — K225 Diverticulum of esophagus, acquired: Secondary | ICD-10-CM | POA: Diagnosis not present

## 2020-01-19 DIAGNOSIS — Z9181 History of falling: Secondary | ICD-10-CM | POA: Diagnosis not present

## 2020-01-19 DIAGNOSIS — H9193 Unspecified hearing loss, bilateral: Secondary | ICD-10-CM | POA: Diagnosis not present

## 2020-01-19 DIAGNOSIS — N400 Enlarged prostate without lower urinary tract symptoms: Secondary | ICD-10-CM | POA: Diagnosis not present

## 2020-01-19 DIAGNOSIS — I452 Bifascicular block: Secondary | ICD-10-CM | POA: Diagnosis not present

## 2020-01-19 DIAGNOSIS — G629 Polyneuropathy, unspecified: Secondary | ICD-10-CM | POA: Diagnosis not present

## 2020-01-25 DIAGNOSIS — E559 Vitamin D deficiency, unspecified: Secondary | ICD-10-CM | POA: Diagnosis not present

## 2020-01-25 DIAGNOSIS — Z9181 History of falling: Secondary | ICD-10-CM | POA: Diagnosis not present

## 2020-01-25 DIAGNOSIS — M48062 Spinal stenosis, lumbar region with neurogenic claudication: Secondary | ICD-10-CM | POA: Diagnosis not present

## 2020-01-25 DIAGNOSIS — I452 Bifascicular block: Secondary | ICD-10-CM | POA: Diagnosis not present

## 2020-01-25 DIAGNOSIS — K225 Diverticulum of esophagus, acquired: Secondary | ICD-10-CM | POA: Diagnosis not present

## 2020-01-25 DIAGNOSIS — M5126 Other intervertebral disc displacement, lumbar region: Secondary | ICD-10-CM | POA: Diagnosis not present

## 2020-01-25 DIAGNOSIS — R1310 Dysphagia, unspecified: Secondary | ICD-10-CM | POA: Diagnosis not present

## 2020-01-25 DIAGNOSIS — G629 Polyneuropathy, unspecified: Secondary | ICD-10-CM | POA: Diagnosis not present

## 2020-01-25 DIAGNOSIS — H4089 Other specified glaucoma: Secondary | ICD-10-CM | POA: Diagnosis not present

## 2020-01-25 DIAGNOSIS — K5730 Diverticulosis of large intestine without perforation or abscess without bleeding: Secondary | ICD-10-CM | POA: Diagnosis not present

## 2020-01-25 DIAGNOSIS — N400 Enlarged prostate without lower urinary tract symptoms: Secondary | ICD-10-CM | POA: Diagnosis not present

## 2020-01-25 DIAGNOSIS — I10 Essential (primary) hypertension: Secondary | ICD-10-CM | POA: Diagnosis not present

## 2020-01-25 DIAGNOSIS — I951 Orthostatic hypotension: Secondary | ICD-10-CM | POA: Diagnosis not present

## 2020-01-25 DIAGNOSIS — M1990 Unspecified osteoarthritis, unspecified site: Secondary | ICD-10-CM | POA: Diagnosis not present

## 2020-01-25 DIAGNOSIS — H9193 Unspecified hearing loss, bilateral: Secondary | ICD-10-CM | POA: Diagnosis not present

## 2020-01-25 DIAGNOSIS — E785 Hyperlipidemia, unspecified: Secondary | ICD-10-CM | POA: Diagnosis not present

## 2020-01-29 DIAGNOSIS — R1310 Dysphagia, unspecified: Secondary | ICD-10-CM | POA: Diagnosis not present

## 2020-01-29 DIAGNOSIS — H4089 Other specified glaucoma: Secondary | ICD-10-CM | POA: Diagnosis not present

## 2020-01-29 DIAGNOSIS — E785 Hyperlipidemia, unspecified: Secondary | ICD-10-CM | POA: Diagnosis not present

## 2020-01-29 DIAGNOSIS — Z9181 History of falling: Secondary | ICD-10-CM | POA: Diagnosis not present

## 2020-01-29 DIAGNOSIS — K5730 Diverticulosis of large intestine without perforation or abscess without bleeding: Secondary | ICD-10-CM | POA: Diagnosis not present

## 2020-01-29 DIAGNOSIS — I452 Bifascicular block: Secondary | ICD-10-CM | POA: Diagnosis not present

## 2020-01-29 DIAGNOSIS — G629 Polyneuropathy, unspecified: Secondary | ICD-10-CM | POA: Diagnosis not present

## 2020-01-29 DIAGNOSIS — M48062 Spinal stenosis, lumbar region with neurogenic claudication: Secondary | ICD-10-CM | POA: Diagnosis not present

## 2020-01-29 DIAGNOSIS — E559 Vitamin D deficiency, unspecified: Secondary | ICD-10-CM | POA: Diagnosis not present

## 2020-01-29 DIAGNOSIS — K225 Diverticulum of esophagus, acquired: Secondary | ICD-10-CM | POA: Diagnosis not present

## 2020-01-29 DIAGNOSIS — M1990 Unspecified osteoarthritis, unspecified site: Secondary | ICD-10-CM | POA: Diagnosis not present

## 2020-01-29 DIAGNOSIS — I10 Essential (primary) hypertension: Secondary | ICD-10-CM | POA: Diagnosis not present

## 2020-01-29 DIAGNOSIS — N400 Enlarged prostate without lower urinary tract symptoms: Secondary | ICD-10-CM | POA: Diagnosis not present

## 2020-01-29 DIAGNOSIS — H9193 Unspecified hearing loss, bilateral: Secondary | ICD-10-CM | POA: Diagnosis not present

## 2020-01-29 DIAGNOSIS — I951 Orthostatic hypotension: Secondary | ICD-10-CM | POA: Diagnosis not present

## 2020-01-29 DIAGNOSIS — M5126 Other intervertebral disc displacement, lumbar region: Secondary | ICD-10-CM | POA: Diagnosis not present

## 2020-02-04 DIAGNOSIS — I951 Orthostatic hypotension: Secondary | ICD-10-CM | POA: Diagnosis not present

## 2020-02-04 DIAGNOSIS — K5730 Diverticulosis of large intestine without perforation or abscess without bleeding: Secondary | ICD-10-CM | POA: Diagnosis not present

## 2020-02-04 DIAGNOSIS — I452 Bifascicular block: Secondary | ICD-10-CM | POA: Diagnosis not present

## 2020-02-04 DIAGNOSIS — E559 Vitamin D deficiency, unspecified: Secondary | ICD-10-CM | POA: Diagnosis not present

## 2020-02-04 DIAGNOSIS — G629 Polyneuropathy, unspecified: Secondary | ICD-10-CM | POA: Diagnosis not present

## 2020-02-04 DIAGNOSIS — N400 Enlarged prostate without lower urinary tract symptoms: Secondary | ICD-10-CM | POA: Diagnosis not present

## 2020-02-04 DIAGNOSIS — H9193 Unspecified hearing loss, bilateral: Secondary | ICD-10-CM | POA: Diagnosis not present

## 2020-02-04 DIAGNOSIS — Z9181 History of falling: Secondary | ICD-10-CM | POA: Diagnosis not present

## 2020-02-04 DIAGNOSIS — E785 Hyperlipidemia, unspecified: Secondary | ICD-10-CM | POA: Diagnosis not present

## 2020-02-04 DIAGNOSIS — M5126 Other intervertebral disc displacement, lumbar region: Secondary | ICD-10-CM | POA: Diagnosis not present

## 2020-02-04 DIAGNOSIS — I10 Essential (primary) hypertension: Secondary | ICD-10-CM | POA: Diagnosis not present

## 2020-02-04 DIAGNOSIS — M48062 Spinal stenosis, lumbar region with neurogenic claudication: Secondary | ICD-10-CM | POA: Diagnosis not present

## 2020-02-04 DIAGNOSIS — K225 Diverticulum of esophagus, acquired: Secondary | ICD-10-CM | POA: Diagnosis not present

## 2020-02-04 DIAGNOSIS — M1990 Unspecified osteoarthritis, unspecified site: Secondary | ICD-10-CM | POA: Diagnosis not present

## 2020-02-04 DIAGNOSIS — H4089 Other specified glaucoma: Secondary | ICD-10-CM | POA: Diagnosis not present

## 2020-02-04 DIAGNOSIS — R1310 Dysphagia, unspecified: Secondary | ICD-10-CM | POA: Diagnosis not present

## 2020-02-14 DIAGNOSIS — M5126 Other intervertebral disc displacement, lumbar region: Secondary | ICD-10-CM | POA: Diagnosis not present

## 2020-02-14 DIAGNOSIS — E559 Vitamin D deficiency, unspecified: Secondary | ICD-10-CM | POA: Diagnosis not present

## 2020-02-14 DIAGNOSIS — I10 Essential (primary) hypertension: Secondary | ICD-10-CM | POA: Diagnosis not present

## 2020-02-14 DIAGNOSIS — K225 Diverticulum of esophagus, acquired: Secondary | ICD-10-CM | POA: Diagnosis not present

## 2020-02-14 DIAGNOSIS — H9193 Unspecified hearing loss, bilateral: Secondary | ICD-10-CM | POA: Diagnosis not present

## 2020-02-14 DIAGNOSIS — I452 Bifascicular block: Secondary | ICD-10-CM | POA: Diagnosis not present

## 2020-02-14 DIAGNOSIS — M1990 Unspecified osteoarthritis, unspecified site: Secondary | ICD-10-CM | POA: Diagnosis not present

## 2020-02-14 DIAGNOSIS — G629 Polyneuropathy, unspecified: Secondary | ICD-10-CM | POA: Diagnosis not present

## 2020-02-14 DIAGNOSIS — R1310 Dysphagia, unspecified: Secondary | ICD-10-CM | POA: Diagnosis not present

## 2020-02-14 DIAGNOSIS — N400 Enlarged prostate without lower urinary tract symptoms: Secondary | ICD-10-CM | POA: Diagnosis not present

## 2020-02-14 DIAGNOSIS — I951 Orthostatic hypotension: Secondary | ICD-10-CM | POA: Diagnosis not present

## 2020-02-14 DIAGNOSIS — H4089 Other specified glaucoma: Secondary | ICD-10-CM | POA: Diagnosis not present

## 2020-02-14 DIAGNOSIS — K5730 Diverticulosis of large intestine without perforation or abscess without bleeding: Secondary | ICD-10-CM | POA: Diagnosis not present

## 2020-02-14 DIAGNOSIS — M48062 Spinal stenosis, lumbar region with neurogenic claudication: Secondary | ICD-10-CM | POA: Diagnosis not present

## 2020-02-14 DIAGNOSIS — Z9181 History of falling: Secondary | ICD-10-CM | POA: Diagnosis not present

## 2020-02-14 DIAGNOSIS — E785 Hyperlipidemia, unspecified: Secondary | ICD-10-CM | POA: Diagnosis not present

## 2020-02-18 ENCOUNTER — Encounter: Payer: Self-pay | Admitting: Podiatry

## 2020-02-18 ENCOUNTER — Other Ambulatory Visit: Payer: Self-pay

## 2020-02-18 ENCOUNTER — Ambulatory Visit: Payer: PPO | Admitting: Podiatry

## 2020-02-18 DIAGNOSIS — R600 Localized edema: Secondary | ICD-10-CM

## 2020-02-18 DIAGNOSIS — M79675 Pain in left toe(s): Secondary | ICD-10-CM

## 2020-02-18 DIAGNOSIS — B351 Tinea unguium: Secondary | ICD-10-CM

## 2020-02-18 DIAGNOSIS — M79674 Pain in right toe(s): Secondary | ICD-10-CM

## 2020-02-20 NOTE — Progress Notes (Signed)
Subjective:   Patient ID: Kirk [name], male   DOB: 79 y.o.   MRN: [id number]   HPI Patient presents stating he has difficult nails that he cannot take care of himself and they become sore 1-5 of both feet and also has chronic swelling in his ankles and states that it is just been this way for a long time.  Patient does not smoke has eye issues and is legally blind and would like to be more active   Review of Systems  All other systems reviewed and are negative.       Objective:  Physical Exam Vitals and nursing note reviewed.  Constitutional:      Appearance: He is well-developed.  Pulmonary:     Effort: Pulmonary effort is normal.  Musculoskeletal:        General: Normal range of motion.  Skin:    General: Skin is warm.  Neurological:     Mental Status: He is alert.     Neurovascular status found to be intact muscle strength was adequate mild diminishment of range of motion subtalar midtarsal joint.  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  Today I went ahead I debrided nailbeds 1-5 both feet no iatrogenic bleeding was noted and I applied ankle compression stockings bilateral for swelling control.  Patient will be seen back for [place] to recheck and will get routine care performed

## 2020-02-21 DIAGNOSIS — Z9181 History of falling: Secondary | ICD-10-CM | POA: Diagnosis not present

## 2020-02-21 DIAGNOSIS — E559 Vitamin D deficiency, unspecified: Secondary | ICD-10-CM | POA: Diagnosis not present

## 2020-02-21 DIAGNOSIS — H9193 Unspecified hearing loss, bilateral: Secondary | ICD-10-CM | POA: Diagnosis not present

## 2020-02-21 DIAGNOSIS — M5126 Other intervertebral disc displacement, lumbar region: Secondary | ICD-10-CM | POA: Diagnosis not present

## 2020-02-21 DIAGNOSIS — R1310 Dysphagia, unspecified: Secondary | ICD-10-CM | POA: Diagnosis not present

## 2020-02-21 DIAGNOSIS — H4089 Other specified glaucoma: Secondary | ICD-10-CM | POA: Diagnosis not present

## 2020-02-21 DIAGNOSIS — G629 Polyneuropathy, unspecified: Secondary | ICD-10-CM | POA: Diagnosis not present

## 2020-02-21 DIAGNOSIS — I452 Bifascicular block: Secondary | ICD-10-CM | POA: Diagnosis not present

## 2020-02-21 DIAGNOSIS — K225 Diverticulum of esophagus, acquired: Secondary | ICD-10-CM | POA: Diagnosis not present

## 2020-02-21 DIAGNOSIS — M1990 Unspecified osteoarthritis, unspecified site: Secondary | ICD-10-CM | POA: Diagnosis not present

## 2020-02-21 DIAGNOSIS — I10 Essential (primary) hypertension: Secondary | ICD-10-CM | POA: Diagnosis not present

## 2020-02-21 DIAGNOSIS — N400 Enlarged prostate without lower urinary tract symptoms: Secondary | ICD-10-CM | POA: Diagnosis not present

## 2020-02-21 DIAGNOSIS — K5730 Diverticulosis of large intestine without perforation or abscess without bleeding: Secondary | ICD-10-CM | POA: Diagnosis not present

## 2020-02-21 DIAGNOSIS — I951 Orthostatic hypotension: Secondary | ICD-10-CM | POA: Diagnosis not present

## 2020-02-21 DIAGNOSIS — M48062 Spinal stenosis, lumbar region with neurogenic claudication: Secondary | ICD-10-CM | POA: Diagnosis not present

## 2020-02-21 DIAGNOSIS — E785 Hyperlipidemia, unspecified: Secondary | ICD-10-CM | POA: Diagnosis not present

## 2020-02-27 DIAGNOSIS — I951 Orthostatic hypotension: Secondary | ICD-10-CM | POA: Diagnosis not present

## 2020-02-27 DIAGNOSIS — Z9181 History of falling: Secondary | ICD-10-CM | POA: Diagnosis not present

## 2020-02-27 DIAGNOSIS — M5126 Other intervertebral disc displacement, lumbar region: Secondary | ICD-10-CM | POA: Diagnosis not present

## 2020-02-27 DIAGNOSIS — E559 Vitamin D deficiency, unspecified: Secondary | ICD-10-CM | POA: Diagnosis not present

## 2020-02-27 DIAGNOSIS — K5730 Diverticulosis of large intestine without perforation or abscess without bleeding: Secondary | ICD-10-CM | POA: Diagnosis not present

## 2020-02-27 DIAGNOSIS — N400 Enlarged prostate without lower urinary tract symptoms: Secondary | ICD-10-CM | POA: Diagnosis not present

## 2020-02-27 DIAGNOSIS — M1990 Unspecified osteoarthritis, unspecified site: Secondary | ICD-10-CM | POA: Diagnosis not present

## 2020-02-27 DIAGNOSIS — I452 Bifascicular block: Secondary | ICD-10-CM | POA: Diagnosis not present

## 2020-02-27 DIAGNOSIS — G629 Polyneuropathy, unspecified: Secondary | ICD-10-CM | POA: Diagnosis not present

## 2020-02-27 DIAGNOSIS — H9193 Unspecified hearing loss, bilateral: Secondary | ICD-10-CM | POA: Diagnosis not present

## 2020-02-27 DIAGNOSIS — M48062 Spinal stenosis, lumbar region with neurogenic claudication: Secondary | ICD-10-CM | POA: Diagnosis not present

## 2020-02-27 DIAGNOSIS — H4089 Other specified glaucoma: Secondary | ICD-10-CM | POA: Diagnosis not present

## 2020-02-27 DIAGNOSIS — R1310 Dysphagia, unspecified: Secondary | ICD-10-CM | POA: Diagnosis not present

## 2020-02-27 DIAGNOSIS — K225 Diverticulum of esophagus, acquired: Secondary | ICD-10-CM | POA: Diagnosis not present

## 2020-02-27 DIAGNOSIS — E785 Hyperlipidemia, unspecified: Secondary | ICD-10-CM | POA: Diagnosis not present

## 2020-02-27 DIAGNOSIS — I10 Essential (primary) hypertension: Secondary | ICD-10-CM | POA: Diagnosis not present

## 2020-03-04 ENCOUNTER — Other Ambulatory Visit: Payer: Self-pay

## 2020-03-04 ENCOUNTER — Ambulatory Visit: Payer: PPO | Admitting: Family Medicine

## 2020-03-04 ENCOUNTER — Encounter: Payer: Self-pay | Admitting: Family Medicine

## 2020-03-04 VITALS — BP 110/72 | HR 86 | Ht 73.0 in | Wt 191.0 lb

## 2020-03-04 DIAGNOSIS — G629 Polyneuropathy, unspecified: Secondary | ICD-10-CM | POA: Diagnosis not present

## 2020-03-04 DIAGNOSIS — M5126 Other intervertebral disc displacement, lumbar region: Secondary | ICD-10-CM

## 2020-03-04 DIAGNOSIS — M48061 Spinal stenosis, lumbar region without neurogenic claudication: Secondary | ICD-10-CM | POA: Diagnosis not present

## 2020-03-04 NOTE — Patient Instructions (Signed)
Check with your primary about the urinary symptoms  Overall I am happy with the progress,  I still think we will see some improvement soon, another 10-20% Keep up the exercises  Can continue the tylenol and the ibuprofen  Stay active  See me again in 2 months

## 2020-03-04 NOTE — Assessment & Plan Note (Signed)
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  Some of it is secondary to the spinal stenosis and some of it is from the peripheral neuropathy.  Very difficult to penetrate overall.  Discussed icing regimen and home exercise, which activities to do which wants to avoid.  Patient is to increase activity slowly again over the course of the next several days.  Follow-up again in 4 to 8 months

## 2020-03-04 NOTE — Progress Notes (Signed)
[facility] [address] [place] Phone: [phone] Subjective:   I Kirk [name] am serving as a Education administrator for Dr. Hulan Saas.  This visit occurred during the SARS-CoV-2 public health emergency.  Safety protocols were in place, including screening questions prior to the visit, additional usage of staff PPE, and extensive cleaning of exam room while observing appropriate contact time as indicated for disinfecting solutions.   I'm seeing this patient by the request  of:  [name], MD  CC: Continued back and leg pain  [id number]   01/08/2020 Continues to have significant trouble.  The patient's discussed with wife as well as reviewing chart for over 45 minutes today.  Do not have the most recent update and evaluation secondary to the nerve conduction study study that would tell us if it is more peripheral neuropathy versus more of a lumbar radiculopathy.  Encouraged her to continue to follow-up with the neurosurgeons because I do think it would be more beneficial to talk about the radiofrequency ablation and if this is a potential or post potential need surgical intervention.  Patient has been fairly noncompliant with most of the medications including the Cymbalta but that was making any difference but then had difficulty with the higher dose.  Discussed and tried to answer as many questions as possible.  Given a note for formal physical therapy to continue to increase activity follow-up again 8 weeks  Update 03/04/2020 Kirk [name] is a 79 y.o. male coming in with complaint of back pain. Patient states he is doing ok. Back and legs doing pretty good. States it is a slow process of healing. Back of his legs itch. Ankle feels as if it wants to "go to sleep". Weakness in the legs. Can't walk without walker.  Patient states that he has made some progress with physical therapy which is the first time in quite some time.  Patient denies any  numbness or tingling.  Patient describes     Past Medical History:  Diagnosis Date  . Cataract   . Congenital glaucoma    "both eyes operated on when I was 43 months old"  . Cough   . Disturbance of skin sensation   . Diverticulosis of colon 2014  . Diverticulum of esophagus, acquired   . Dysphagia, unspecified(787.20)   . Encounter for long-term (current) use of other medications   . Herpes zoster without mention of complication   . Hyperglycemia    patient denies  . Hyperlipidemia   . Hypertension   . 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 Lumbago   . Nevus, non-neoplastic   . Osteoarthrosis, unspecified whether generalized or localized, unspecified site    pt. denies  . Other premature beats   . Pain in limb   . Plantar fascial fibromatosis   . Special screening for malignant neoplasm of prostate   . Unspecified glaucoma(365.9)   . Unspecified pruritic disorder   . Unspecified vitamin D deficiency   . Urinary frequency   . Zenker's diverticulum    Past Surgical History:  Procedure Laterality Date  . CATARACT EXTRACTION BILATERAL W/ ANTERIOR VITRECTOMY Bilateral 1977  . COLONOSCOPY  09/06/2013   [name]  . DIRECT LARYNGOSCOPY  10/22/2015   Cervical esophagoscopy. Endoscopic esophageal diverticulotomy.   Marland [place]  . GLAUCOMA SURGERY  1945   congenital glaucoma  . HERNIA REPAIR  1993  . INCISIONAL  HERNIA REPAIR  2008  . LARYNGOSCOPY     with stapling of zenkers diverticulum  . LUMBAR LAMINECTOMY/ DECOMPRESSION WITH MET-RX Left 08/16/2019   Procedure: Left Lumbar Two-Three Minimally Invasive Laminectomy and Microdiscectomy;  Surgeon: Judith Part, MD;  Location: [facility];  Service: Neurosurgery;  Laterality: Left;  Left Lumbar 2-3 Minimally invasive laminectomy and microdiscectomy   Social History   Socioeconomic History  . Marital status: Married    Spouse name: Not on file   . Number of children: Not on file  . Years of education: Not on file  . Highest education level: Not on file  Occupational History  . Not on file  Tobacco Use  . Smoking status: Never Smoker  . Smokeless tobacco: Never Used  Substance and Sexual Activity  . Alcohol use: No  . Drug use: No  . Sexual activity: Not on file  Other Topics Concern  . Not on file  Social History Narrative   Married   Lives at home with wife Pamala Hurry)   No regular exercise, but does his own yard work and household chores.   Never smoked   Alcohol none   Social Determinants of Health   Financial Resource Strain:   . Difficulty of Paying Living Expenses:   Food Insecurity:   . Worried About Charity fundraiser in the Last Year:   . Arboriculturist in the Last Year:   Transportation Needs:   . Film/video editor (Medical):   Marland Kitchen Lack of Transportation (Non-Medical):   Physical Activity:   . Days of Exercise per Week:   . Minutes of Exercise per Session:   Stress:   . Feeling of Stress :   Social Connections:   . Frequency of Communication with Friends and Family:   . Frequency of Social Gatherings with Friends and Family:   . Attends Religious Services:   . Active Member of Clubs or Organizations:   . Attends Archivist Meetings:   Marland Kitchen Marital Status:    Allergies  Allergen Reactions  . Reserpine Other (See Comments)    Unknown reaction  . Sulfa Antibiotics Other (See Comments)    Patient denies - Unknown reaction   Family History  Problem Relation Age of Onset  . Hypertension Mother   . Dementia Mother   . Kidney disease Father   . CAD Father   . Colon cancer Neg Hx   . Throat cancer Neg Hx   . Diabetes Neg Hx   . Liver disease Neg Hx      Current Outpatient Medications (Cardiovascular):  .  lisinopril-hydrochlorothiazide (ZESTORETIC) 20-12.5 MG tablet, Take 0.5 tablets by mouth daily.   Current Outpatient Medications (Analgesics):  .  acetaminophen (TYLENOL) 325  MG tablet, Take 650 mg by mouth every 6 (six) hours as needed (pain).  Marland Kitchen  oxyCODONE (OXY IR/ROXICODONE) 5 MG immediate release tablet, Take 1 tablet (5 mg total) by mouth every 4 (four) hours as needed (pain).   Current Outpatient Medications (Other):  .  amoxicillin-clavulanate (AUGMENTIN) 875-125 MG tablet, Take 1 tablet by mouth 2 (two) times daily. .  brimonidine (ALPHAGAN) 0.2 % ophthalmic solution, Place 1 drop into both eyes 2 (two) times daily.  .  Cholecalciferol 25 MCG (1000 UT) tablet, Take 1,000 Units by mouth daily.  .  DULoxetine (CYMBALTA) 20 MG capsule, Take 1 capsule (20 mg total) by mouth daily. .  timolol (TIMOPTIC) 0.5 % ophthalmic solution, Place 1 drop into the right eye  daily.    Reviewed prior external information including notes and imaging from  primary care provider As well as notes that were available from care everywhere and other healthcare systems.  Past medical history, social, surgical and family history all reviewed in electronic medical record.  No pertanent information unless stated regarding to the chief complaint.   Review of Systems:  No headache, visual changes, nausea, vomiting, diarrhea, constipation, dizziness, abdominal pain, skin rash, fevers, chills, night sweats, weight loss, swollen lymph nodes,  joint swelling, chest pain, shortness of breath, mood changes. POSITIVE muscle aches, body aches   Objective  Blood pressure 110/72, pulse 86, height '6\' 1"'  (1.854 m), weight 191 lb (86.6 kg), SpO2 96 %.   General: No apparent distress alert and oriented x3 mood and affect normal, dressed appropriately.  HEENT: Pupils equal, extraocular movements intact  Respiratory: Patient's speak in full sentences and does not appear short of breath  Cardiovascular: No lower extremity edema, non tender, no erythema  Gait severely antalgic with small MSK: Patient does have some mild neuropathy noted pulses in the lower extremities are 1+ dorsalis  pedis. Recommendation significant loss of lordosis.  Severe tenderness with straight leg test with mild worsening radicular symptoms.  Patient has difficulty with balance and difficulty due to marked range of motion exercises.  Deep tendon reflexes do appear to be intact.  Patient does have some mild atrophy of the lower extremities bilaterally.    Impression and Recommendations:     The above documentation has been reviewed and is accurate and complete Lyndal Pulley, DO       Note: This dictation was prepared with Dragon dictation along with smaller phrase technology. Any transcriptional errors that result from this process are unintentional.

## 2020-03-06 ENCOUNTER — Telehealth: Payer: Self-pay | Admitting: Family Medicine

## 2020-03-06 DIAGNOSIS — E785 Hyperlipidemia, unspecified: Secondary | ICD-10-CM | POA: Diagnosis not present

## 2020-03-06 DIAGNOSIS — H4089 Other specified glaucoma: Secondary | ICD-10-CM | POA: Diagnosis not present

## 2020-03-06 DIAGNOSIS — K5730 Diverticulosis of large intestine without perforation or abscess without bleeding: Secondary | ICD-10-CM | POA: Diagnosis not present

## 2020-03-06 DIAGNOSIS — G629 Polyneuropathy, unspecified: Secondary | ICD-10-CM | POA: Diagnosis not present

## 2020-03-06 DIAGNOSIS — I10 Essential (primary) hypertension: Secondary | ICD-10-CM | POA: Diagnosis not present

## 2020-03-06 DIAGNOSIS — R1310 Dysphagia, unspecified: Secondary | ICD-10-CM | POA: Diagnosis not present

## 2020-03-06 DIAGNOSIS — M48062 Spinal stenosis, lumbar region with neurogenic claudication: Secondary | ICD-10-CM | POA: Diagnosis not present

## 2020-03-06 DIAGNOSIS — M5126 Other intervertebral disc displacement, lumbar region: Secondary | ICD-10-CM | POA: Diagnosis not present

## 2020-03-06 DIAGNOSIS — K225 Diverticulum of esophagus, acquired: Secondary | ICD-10-CM | POA: Diagnosis not present

## 2020-03-06 DIAGNOSIS — I452 Bifascicular block: Secondary | ICD-10-CM | POA: Diagnosis not present

## 2020-03-06 DIAGNOSIS — Z9181 History of falling: Secondary | ICD-10-CM | POA: Diagnosis not present

## 2020-03-06 DIAGNOSIS — E559 Vitamin D deficiency, unspecified: Secondary | ICD-10-CM | POA: Diagnosis not present

## 2020-03-06 DIAGNOSIS — N400 Enlarged prostate without lower urinary tract symptoms: Secondary | ICD-10-CM | POA: Diagnosis not present

## 2020-03-06 DIAGNOSIS — M1990 Unspecified osteoarthritis, unspecified site: Secondary | ICD-10-CM | POA: Diagnosis not present

## 2020-03-06 DIAGNOSIS — I951 Orthostatic hypotension: Secondary | ICD-10-CM | POA: Diagnosis not present

## 2020-03-06 DIAGNOSIS — H9193 Unspecified hearing loss, bilateral: Secondary | ICD-10-CM | POA: Diagnosis not present

## 2020-03-06 NOTE — Telephone Encounter (Signed)
Samyra from [facility] called requesting verbal orders for PT one time a week for 2 weeks to work on gait training with a single point cane.   Phone: [phone]

## 2020-03-06 NOTE — Telephone Encounter (Signed)
Spoke with PT providing verbal order.

## 2020-03-12 DIAGNOSIS — H9193 Unspecified hearing loss, bilateral: Secondary | ICD-10-CM | POA: Diagnosis not present

## 2020-03-12 DIAGNOSIS — I452 Bifascicular block: Secondary | ICD-10-CM | POA: Diagnosis not present

## 2020-03-12 DIAGNOSIS — N400 Enlarged prostate without lower urinary tract symptoms: Secondary | ICD-10-CM | POA: Diagnosis not present

## 2020-03-12 DIAGNOSIS — Z9181 History of falling: Secondary | ICD-10-CM | POA: Diagnosis not present

## 2020-03-12 DIAGNOSIS — R1310 Dysphagia, unspecified: Secondary | ICD-10-CM | POA: Diagnosis not present

## 2020-03-12 DIAGNOSIS — E559 Vitamin D deficiency, unspecified: Secondary | ICD-10-CM | POA: Diagnosis not present

## 2020-03-12 DIAGNOSIS — G629 Polyneuropathy, unspecified: Secondary | ICD-10-CM | POA: Diagnosis not present

## 2020-03-12 DIAGNOSIS — K225 Diverticulum of esophagus, acquired: Secondary | ICD-10-CM | POA: Diagnosis not present

## 2020-03-12 DIAGNOSIS — E785 Hyperlipidemia, unspecified: Secondary | ICD-10-CM | POA: Diagnosis not present

## 2020-03-12 DIAGNOSIS — M1990 Unspecified osteoarthritis, unspecified site: Secondary | ICD-10-CM | POA: Diagnosis not present

## 2020-03-12 DIAGNOSIS — H4089 Other specified glaucoma: Secondary | ICD-10-CM | POA: Diagnosis not present

## 2020-03-12 DIAGNOSIS — I951 Orthostatic hypotension: Secondary | ICD-10-CM | POA: Diagnosis not present

## 2020-03-12 DIAGNOSIS — I10 Essential (primary) hypertension: Secondary | ICD-10-CM | POA: Diagnosis not present

## 2020-03-12 DIAGNOSIS — M48062 Spinal stenosis, lumbar region with neurogenic claudication: Secondary | ICD-10-CM | POA: Diagnosis not present

## 2020-03-12 DIAGNOSIS — K5730 Diverticulosis of large intestine without perforation or abscess without bleeding: Secondary | ICD-10-CM | POA: Diagnosis not present

## 2020-03-12 DIAGNOSIS — M5126 Other intervertebral disc displacement, lumbar region: Secondary | ICD-10-CM | POA: Diagnosis not present

## 2020-03-17 DIAGNOSIS — H02834 Dermatochalasis of left upper eyelid: Secondary | ICD-10-CM | POA: Diagnosis not present

## 2020-03-17 DIAGNOSIS — H02831 Dermatochalasis of right upper eyelid: Secondary | ICD-10-CM | POA: Diagnosis not present

## 2020-03-17 DIAGNOSIS — H43393 Other vitreous opacities, bilateral: Secondary | ICD-10-CM | POA: Diagnosis not present

## 2020-03-17 DIAGNOSIS — H04123 Dry eye syndrome of bilateral lacrimal glands: Secondary | ICD-10-CM | POA: Diagnosis not present

## 2020-03-18 DIAGNOSIS — E559 Vitamin D deficiency, unspecified: Secondary | ICD-10-CM | POA: Diagnosis not present

## 2020-03-18 DIAGNOSIS — I452 Bifascicular block: Secondary | ICD-10-CM | POA: Diagnosis not present

## 2020-03-18 DIAGNOSIS — M5126 Other intervertebral disc displacement, lumbar region: Secondary | ICD-10-CM | POA: Diagnosis not present

## 2020-03-18 DIAGNOSIS — H4089 Other specified glaucoma: Secondary | ICD-10-CM | POA: Diagnosis not present

## 2020-03-18 DIAGNOSIS — K5730 Diverticulosis of large intestine without perforation or abscess without bleeding: Secondary | ICD-10-CM | POA: Diagnosis not present

## 2020-03-18 DIAGNOSIS — K225 Diverticulum of esophagus, acquired: Secondary | ICD-10-CM | POA: Diagnosis not present

## 2020-03-18 DIAGNOSIS — I10 Essential (primary) hypertension: Secondary | ICD-10-CM | POA: Diagnosis not present

## 2020-03-18 DIAGNOSIS — G629 Polyneuropathy, unspecified: Secondary | ICD-10-CM | POA: Diagnosis not present

## 2020-03-18 DIAGNOSIS — Z9181 History of falling: Secondary | ICD-10-CM | POA: Diagnosis not present

## 2020-03-18 DIAGNOSIS — M48062 Spinal stenosis, lumbar region with neurogenic claudication: Secondary | ICD-10-CM | POA: Diagnosis not present

## 2020-03-18 DIAGNOSIS — M1990 Unspecified osteoarthritis, unspecified site: Secondary | ICD-10-CM | POA: Diagnosis not present

## 2020-03-18 DIAGNOSIS — N400 Enlarged prostate without lower urinary tract symptoms: Secondary | ICD-10-CM | POA: Diagnosis not present

## 2020-03-18 DIAGNOSIS — E785 Hyperlipidemia, unspecified: Secondary | ICD-10-CM | POA: Diagnosis not present

## 2020-03-18 DIAGNOSIS — H9193 Unspecified hearing loss, bilateral: Secondary | ICD-10-CM | POA: Diagnosis not present

## 2020-03-18 DIAGNOSIS — I951 Orthostatic hypotension: Secondary | ICD-10-CM | POA: Diagnosis not present

## 2020-03-18 DIAGNOSIS — R1310 Dysphagia, unspecified: Secondary | ICD-10-CM | POA: Diagnosis not present

## 2020-03-19 ENCOUNTER — Ambulatory Visit: Payer: PPO | Admitting: Podiatry

## 2020-04-01 DIAGNOSIS — I452 Bifascicular block: Secondary | ICD-10-CM | POA: Diagnosis not present

## 2020-04-01 DIAGNOSIS — E785 Hyperlipidemia, unspecified: Secondary | ICD-10-CM | POA: Diagnosis not present

## 2020-04-01 DIAGNOSIS — G629 Polyneuropathy, unspecified: Secondary | ICD-10-CM | POA: Diagnosis not present

## 2020-04-01 DIAGNOSIS — K225 Diverticulum of esophagus, acquired: Secondary | ICD-10-CM | POA: Diagnosis not present

## 2020-04-01 DIAGNOSIS — M1990 Unspecified osteoarthritis, unspecified site: Secondary | ICD-10-CM | POA: Diagnosis not present

## 2020-04-01 DIAGNOSIS — M48062 Spinal stenosis, lumbar region with neurogenic claudication: Secondary | ICD-10-CM | POA: Diagnosis not present

## 2020-04-01 DIAGNOSIS — K5730 Diverticulosis of large intestine without perforation or abscess without bleeding: Secondary | ICD-10-CM | POA: Diagnosis not present

## 2020-04-01 DIAGNOSIS — Z9181 History of falling: Secondary | ICD-10-CM | POA: Diagnosis not present

## 2020-04-01 DIAGNOSIS — H9193 Unspecified hearing loss, bilateral: Secondary | ICD-10-CM | POA: Diagnosis not present

## 2020-04-01 DIAGNOSIS — E559 Vitamin D deficiency, unspecified: Secondary | ICD-10-CM | POA: Diagnosis not present

## 2020-04-01 DIAGNOSIS — I10 Essential (primary) hypertension: Secondary | ICD-10-CM | POA: Diagnosis not present

## 2020-04-01 DIAGNOSIS — H4089 Other specified glaucoma: Secondary | ICD-10-CM | POA: Diagnosis not present

## 2020-04-01 DIAGNOSIS — N400 Enlarged prostate without lower urinary tract symptoms: Secondary | ICD-10-CM | POA: Diagnosis not present

## 2020-04-01 DIAGNOSIS — M5126 Other intervertebral disc displacement, lumbar region: Secondary | ICD-10-CM | POA: Diagnosis not present

## 2020-04-01 DIAGNOSIS — I951 Orthostatic hypotension: Secondary | ICD-10-CM | POA: Diagnosis not present

## 2020-04-08 DIAGNOSIS — K225 Diverticulum of esophagus, acquired: Secondary | ICD-10-CM | POA: Diagnosis not present

## 2020-04-08 DIAGNOSIS — E559 Vitamin D deficiency, unspecified: Secondary | ICD-10-CM | POA: Diagnosis not present

## 2020-04-08 DIAGNOSIS — E785 Hyperlipidemia, unspecified: Secondary | ICD-10-CM | POA: Diagnosis not present

## 2020-04-08 DIAGNOSIS — G629 Polyneuropathy, unspecified: Secondary | ICD-10-CM | POA: Diagnosis not present

## 2020-04-08 DIAGNOSIS — H9193 Unspecified hearing loss, bilateral: Secondary | ICD-10-CM | POA: Diagnosis not present

## 2020-04-08 DIAGNOSIS — M5126 Other intervertebral disc displacement, lumbar region: Secondary | ICD-10-CM | POA: Diagnosis not present

## 2020-04-08 DIAGNOSIS — I951 Orthostatic hypotension: Secondary | ICD-10-CM | POA: Diagnosis not present

## 2020-04-08 DIAGNOSIS — I452 Bifascicular block: Secondary | ICD-10-CM | POA: Diagnosis not present

## 2020-04-08 DIAGNOSIS — M48062 Spinal stenosis, lumbar region with neurogenic claudication: Secondary | ICD-10-CM | POA: Diagnosis not present

## 2020-04-08 DIAGNOSIS — Z9181 History of falling: Secondary | ICD-10-CM | POA: Diagnosis not present

## 2020-04-08 DIAGNOSIS — K5730 Diverticulosis of large intestine without perforation or abscess without bleeding: Secondary | ICD-10-CM | POA: Diagnosis not present

## 2020-04-08 DIAGNOSIS — M1990 Unspecified osteoarthritis, unspecified site: Secondary | ICD-10-CM | POA: Diagnosis not present

## 2020-04-08 DIAGNOSIS — I10 Essential (primary) hypertension: Secondary | ICD-10-CM | POA: Diagnosis not present

## 2020-04-08 DIAGNOSIS — N400 Enlarged prostate without lower urinary tract symptoms: Secondary | ICD-10-CM | POA: Diagnosis not present

## 2020-04-08 DIAGNOSIS — H4089 Other specified glaucoma: Secondary | ICD-10-CM | POA: Diagnosis not present

## 2020-04-14 DIAGNOSIS — M48062 Spinal stenosis, lumbar region with neurogenic claudication: Secondary | ICD-10-CM | POA: Diagnosis not present

## 2020-04-14 DIAGNOSIS — E559 Vitamin D deficiency, unspecified: Secondary | ICD-10-CM | POA: Diagnosis not present

## 2020-04-14 DIAGNOSIS — M1990 Unspecified osteoarthritis, unspecified site: Secondary | ICD-10-CM | POA: Diagnosis not present

## 2020-04-14 DIAGNOSIS — N400 Enlarged prostate without lower urinary tract symptoms: Secondary | ICD-10-CM | POA: Diagnosis not present

## 2020-04-14 DIAGNOSIS — M5126 Other intervertebral disc displacement, lumbar region: Secondary | ICD-10-CM | POA: Diagnosis not present

## 2020-04-14 DIAGNOSIS — I452 Bifascicular block: Secondary | ICD-10-CM | POA: Diagnosis not present

## 2020-04-14 DIAGNOSIS — G629 Polyneuropathy, unspecified: Secondary | ICD-10-CM | POA: Diagnosis not present

## 2020-04-14 DIAGNOSIS — Z9181 History of falling: Secondary | ICD-10-CM | POA: Diagnosis not present

## 2020-04-14 DIAGNOSIS — H4089 Other specified glaucoma: Secondary | ICD-10-CM | POA: Diagnosis not present

## 2020-04-14 DIAGNOSIS — I10 Essential (primary) hypertension: Secondary | ICD-10-CM | POA: Diagnosis not present

## 2020-04-14 DIAGNOSIS — K225 Diverticulum of esophagus, acquired: Secondary | ICD-10-CM | POA: Diagnosis not present

## 2020-04-14 DIAGNOSIS — K5730 Diverticulosis of large intestine without perforation or abscess without bleeding: Secondary | ICD-10-CM | POA: Diagnosis not present

## 2020-04-14 DIAGNOSIS — I951 Orthostatic hypotension: Secondary | ICD-10-CM | POA: Diagnosis not present

## 2020-04-14 DIAGNOSIS — H9193 Unspecified hearing loss, bilateral: Secondary | ICD-10-CM | POA: Diagnosis not present

## 2020-04-14 DIAGNOSIS — E785 Hyperlipidemia, unspecified: Secondary | ICD-10-CM | POA: Diagnosis not present

## 2020-05-06 ENCOUNTER — Ambulatory Visit (INDEPENDENT_AMBULATORY_CARE_PROVIDER_SITE_OTHER): Payer: PPO

## 2020-05-06 ENCOUNTER — Ambulatory Visit: Payer: PPO | Admitting: Family Medicine

## 2020-05-06 ENCOUNTER — Other Ambulatory Visit: Payer: Self-pay

## 2020-05-06 ENCOUNTER — Encounter: Payer: Self-pay | Admitting: Family Medicine

## 2020-05-06 VITALS — BP 130/90 | HR 82 | Ht 73.0 in | Wt 193.0 lb

## 2020-05-06 DIAGNOSIS — M25552 Pain in left hip: Secondary | ICD-10-CM | POA: Diagnosis not present

## 2020-05-06 DIAGNOSIS — G629 Polyneuropathy, unspecified: Secondary | ICD-10-CM

## 2020-05-06 DIAGNOSIS — R279 Unspecified lack of coordination: Secondary | ICD-10-CM

## 2020-05-06 DIAGNOSIS — G8929 Other chronic pain: Secondary | ICD-10-CM | POA: Diagnosis not present

## 2020-05-06 DIAGNOSIS — R7989 Other specified abnormal findings of blood chemistry: Secondary | ICD-10-CM | POA: Diagnosis not present

## 2020-05-06 DIAGNOSIS — R2689 Other abnormalities of gait and mobility: Secondary | ICD-10-CM

## 2020-05-06 DIAGNOSIS — M48061 Spinal stenosis, lumbar region without neurogenic claudication: Secondary | ICD-10-CM | POA: Diagnosis not present

## 2020-05-06 DIAGNOSIS — M545 Low back pain, unspecified: Secondary | ICD-10-CM

## 2020-05-06 DIAGNOSIS — M25559 Pain in unspecified hip: Secondary | ICD-10-CM

## 2020-05-06 DIAGNOSIS — M25551 Pain in right hip: Secondary | ICD-10-CM | POA: Diagnosis not present

## 2020-05-06 MED ORDER — LEVOTHYROXINE SODIUM 50 MCG PO TABS: 50.0000 ug | ORAL_TABLET | Freq: Every day | ORAL | 3 refills | Status: DC

## 2020-05-06 NOTE — Progress Notes (Signed)
Kirk [name] Phone: [phone] Subjective:   I Kirk [name] am serving as a Education administrator for Dr. Hulan Saas.  This visit occurred during the SARS-CoV-2 public health emergency.  Safety protocols were in place, including screening questions prior to the visit, additional usage of staff PPE, and extensive cleaning of exam room while observing appropriate contact time as indicated for disinfecting solutions.   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  Some of it is secondary to the spinal stenosis and some of it is from the peripheral neuropathy.  Very difficult to penetrate overall.  Discussed icing regimen and home exercise, which activities to do which wants to avoid.  Patient is to increase activity slowly again over the course of the next several days.  Follow-up again in 4 to 8 months  Update 05/06/2020 Kirk [name] is a 79 y.o. male coming in with complaint of back pain. Patient states he is not doing well. Doing exercises at home at states he doesn't feel like he's getting anywhere. Below his knees he is numb. States that his progress is slow.  Patient feels that the legs are getting colder.  Previous exam we did do an ABI as well as venous Dopplers without any significant finding except mild abnormal pulse noted in the first toe on the right foot.  Patient wife states that it seems to be getting cooler at the moment.  Patient is having difficulty even standing secondary to either weakness of the lower extremities for him not being aware of where he is in space.  Patient denies any dizziness.     Past Medical History:  Diagnosis Date  . Cataract   . Congenital glaucoma    "both eyes operated on when I was 31 months old"  . Cough   . Disturbance of skin sensation   . Diverticulosis  of colon 2014  . Diverticulum of esophagus, acquired   . Dysphagia, unspecified(787.20)   . Encounter for long-term (current) use of other medications   . Herpes zoster without mention of complication   . Hyperglycemia    patient denies  . Hyperlipidemia   . Hypertension   . 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 Lumbago   . Nevus, non-neoplastic   . Osteoarthrosis, unspecified whether generalized or localized, unspecified site    pt. denies  . Other premature beats   . Pain in limb   . Plantar fascial fibromatosis   . Special screening for malignant neoplasm of prostate   . Unspecified glaucoma(365.9)   . Unspecified pruritic disorder   . Unspecified vitamin D deficiency   . Urinary frequency   . Zenker's diverticulum    Past Surgical History:  Procedure Laterality Date  . CATARACT EXTRACTION BILATERAL W/ ANTERIOR VITRECTOMY Bilateral 1977  . COLONOSCOPY  09/06/2013   [name]  . DIRECT LARYNGOSCOPY  10/22/2015   Cervical esophagoscopy. Endoscopic esophageal diverticulotomy.   Marland Kitchen [place]  . GLAUCOMA SURGERY  1945   congenital glaucoma  . HERNIA REPAIR  1993  . [place]  2008  . LARYNGOSCOPY     with stapling of zenkers diverticulum  . LUMBAR LAMINECTOMY/ DECOMPRESSION WITH MET-RX Left 08/16/2019   Procedure: Left Lumbar Two-Three Minimally Invasive Laminectomy and  Microdiscectomy;  Surgeon: Judith Part, MD;  Location: [facility];  Service: Neurosurgery;  Laterality: Left;  Left Lumbar 2-3 Minimally invasive laminectomy and microdiscectomy   Social History   Socioeconomic History  . Marital status: Married    Spouse name: Not on file  . Number of children: Not on file  . Years of education: Not on file  . Highest education level: Not on file  Occupational History  . Not on file  Tobacco Use  . Smoking status: Never Smoker  . Smokeless tobacco: Never Used  Vaping Use   . Vaping Use: Never used  Substance and Sexual Activity  . Alcohol use: No  . Drug use: No  . Sexual activity: Not on file  Other Topics Concern  . Not on file  Social History Narrative   Married   Lives at home with wife Pamala Hurry)   No regular exercise, but does his own yard work and household chores.   Never smoked   Alcohol none   Social Determinants of Health   Financial Resource Strain:   . Difficulty of Paying Living Expenses:   Food Insecurity:   . Worried About Charity fundraiser in the Last Year:   . Arboriculturist in the Last Year:   Transportation Needs:   . Film/video editor (Medical):   Marland Kitchen Lack of Transportation (Non-Medical):   Physical Activity:   . Days of Exercise per Week:   . Minutes of Exercise per Session:   Stress:   . Feeling of Stress :   Social Connections:   . Frequency of Communication with Friends and Family:   . Frequency of Social Gatherings with Friends and Family:   . Attends Religious Services:   . Active Member of Clubs or Organizations:   . Attends Archivist Meetings:   Marland Kitchen Marital Status:    Allergies  Allergen Reactions  . Reserpine Other (See Comments)    Unknown reaction  . Sulfa Antibiotics Other (See Comments)    Patient denies - Unknown reaction   Family History  Problem Relation Age of Onset  . Hypertension Mother   . Dementia Mother   . Kidney disease Father   . CAD Father   . Colon cancer Neg Hx   . Throat cancer Neg Hx   . Diabetes Neg Hx   . Liver disease Neg Hx     Current Outpatient Medications (Endocrine & Metabolic):  .  levothyroxine (SYNTHROID) 50 MCG tablet, Take 1 tablet (50 mcg total) by mouth daily before breakfast.  Current Outpatient Medications (Cardiovascular):  .  lisinopril-hydrochlorothiazide (ZESTORETIC) 20-12.5 MG tablet, Take 0.5 tablets by mouth daily.   Current Outpatient Medications (Analgesics):  .  acetaminophen (TYLENOL) 325 MG tablet, Take 650 mg by mouth every 6  (six) hours as needed (pain).  Marland Kitchen  oxyCODONE (OXY IR/ROXICODONE) 5 MG immediate release tablet, Take 1 tablet (5 mg total) by mouth every 4 (four) hours as needed (pain).   Current Outpatient Medications (Other):  .  amoxicillin-clavulanate (AUGMENTIN) 875-125 MG tablet, Take 1 tablet by mouth 2 (two) times daily. .  brimonidine (ALPHAGAN) 0.2 % ophthalmic solution, Place 1 drop into both eyes 2 (two) times daily.  .  Cholecalciferol 25 MCG (1000 UT) tablet, Take 1,000 Units by mouth daily.  .  DULoxetine (CYMBALTA) 20 MG capsule, Take 1 capsule (20 mg total) by mouth daily. .  timolol (TIMOPTIC) 0.5 % ophthalmic solution, Place 1 drop into the right eye daily.  Reviewed prior external information including notes and imaging from  primary care provider As well as notes that were available from care everywhere and other healthcare systems.  Past medical history, social, surgical and family history all reviewed in electronic medical record.  No pertanent information unless stated regarding to the chief complaint.   Review of Systems:  No headache, visual changes, nausea, vomiting, diarrhea, constipation, dizziness, abdominal pain, skin rash, fevers, chills, night sweats, weight loss, swollen lymph nodes,  joint swelling, chest pain, shortness of breath, mood changes. POSITIVE muscle aches, body aches, peripheral neuropathy  Objective  Blood pressure 130/90, pulse 82, height _0  (1.854 m), weight 193 lb (87.5 kg), SpO2 97 %.   General: No apparent distress alert and oriented x3 mood and affect normal, dressed appropriately.  HEENT: Pupils equal, lipoma noted Respiratory: Patient's speak in full sentences and does not appear short of breath  Cardiovascular: 1+ distal pulses.  Deep tendon reflexes 1+ but symmetric.  Patient has significant coolness to touch of the lower extremities all the way to the knee.  Pitting edema noted 1+ to knees NGait wide-based gait walks with the aid of a  rolling walker.  Difficulty with gait and standing. MSK: Significant arthritic changes back exam loss of lordosis.  Difficulty with straight leg test..     Impression and Recommendations:     The above documentation has been reviewed and is accurate and complete Lyndal Pulley, DO       Note: This dictation was prepared with Dragon dictation along with smaller phrase technology. Any transcriptional errors that result from this process are unintentional.

## 2020-05-06 NOTE — Assessment & Plan Note (Signed)
Started very low-dose of Synthroid as an augmentative patient peripheral neuropathy

## 2020-05-06 NOTE — Assessment & Plan Note (Signed)
No significant improvement after the back surgery.  Do not feel the epidurals will be beneficial.  Continues to have the peripheral neuropathy consider possible nerve conduction test

## 2020-05-06 NOTE — Assessment & Plan Note (Signed)
Patient continues to have difficulty with the neuropathy which is causing some balance and coordination issues.  Patient has significant coolness to touch of the lower extremities and even though they were normal greater than 1 year ago I do think it is worth [place] retesting the arterial blood supply and an ABI.  Patient will have this done and will follow up afterwards.  We discussed different medication choices and really other ideas would be to add Synthroid at follow-up x-rays ordered today of the pelvis to make sure nothing else is contributing to some of the pain.  We have tried treating patient with morbid degenerative disc disease with no significant improvement with the epidurals.  Continue the Cymbalta at this time.  Patient will follow up with me again in 4 to 8 weeks.  Time with patient and wife today answered all questions and discussed other management 33 minutes

## 2020-05-06 NOTE — Patient Instructions (Addendum)
Good to see you Pelvis xray See me again in 2 months

## 2020-05-21 ENCOUNTER — Ambulatory Visit: Payer: PPO | Admitting: Podiatry

## 2020-06-02 ENCOUNTER — Ambulatory Visit (HOSPITAL_COMMUNITY): Payer: PPO

## 2020-06-06 ENCOUNTER — Ambulatory Visit (HOSPITAL_COMMUNITY)
Admission: RE | Admit: 2020-06-06 | Discharge: 2020-06-06 | Disposition: A | Discharge status: Home / Self Care | Payer: PPO | Source: Ambulatory Visit | Attending: Cardiology | Admitting: Cardiology

## 2020-06-06 ENCOUNTER — Other Ambulatory Visit: Payer: Self-pay

## 2020-06-06 DIAGNOSIS — G8929 Other chronic pain: Secondary | ICD-10-CM

## 2020-06-06 DIAGNOSIS — M545 Low back pain, unspecified: Secondary | ICD-10-CM

## 2020-06-06 DIAGNOSIS — M25559 Pain in unspecified hip: Secondary | ICD-10-CM | POA: Diagnosis not present

## 2020-06-16 ENCOUNTER — Encounter: Payer: Self-pay | Admitting: Family Medicine

## 2020-06-16 ENCOUNTER — Other Ambulatory Visit: Payer: Self-pay

## 2020-06-16 ENCOUNTER — Ambulatory Visit (INDEPENDENT_AMBULATORY_CARE_PROVIDER_SITE_OTHER): Payer: PPO | Admitting: Family Medicine

## 2020-06-16 VITALS — BP 163/93 | HR 71 | Temp 98.0°F | Ht 73.0 in | Wt 193.6 lb

## 2020-06-16 DIAGNOSIS — L309 Dermatitis, unspecified: Secondary | ICD-10-CM

## 2020-06-16 DIAGNOSIS — R29898 Other symptoms and signs involving the musculoskeletal system: Secondary | ICD-10-CM

## 2020-06-16 DIAGNOSIS — I10 Essential (primary) hypertension: Secondary | ICD-10-CM

## 2020-06-16 DIAGNOSIS — L570 Actinic keratosis: Secondary | ICD-10-CM | POA: Insufficient documentation

## 2020-06-16 NOTE — Assessment & Plan Note (Signed)
Has had extensive work-up and has seen multiple specialists over the last several months for this.  He has obvious unsteadiness with gait.  Neurologic exam is otherwise unremarkable.  Will check labs including CBC, CMET, TSH, B12, and CK.  Possibly secondary to deconditioning.  Depending on results will likely need to follow back up with sports medicine/physical therapy.

## 2020-06-16 NOTE — Assessment & Plan Note (Signed)
Cryotherapy applied today.  See below procedure note. 

## 2020-06-16 NOTE — Progress Notes (Signed)
   Kirk [name] is a 79 y.o. male who presents today for an office visit.  Assessment/Plan:  New/Acute Problems: Dermatitis Recommended topical emollient and cortisone cream as needed.  Chronic Problems Addressed Today: Essential hypertension Above goal.  Previously been well controlled.  Continue home monitoring goal 150/90 or lower.  We will continue current dose of lisinopril-HCTZ  Bilateral leg weakness Has had extensive work-up and has seen multiple specialists over the last several months for this.  He has obvious unsteadiness with gait.  Neurologic exam is otherwise unremarkable.  Will check labs including CBC, CMET, TSH, B12, and CK.  Possibly secondary to deconditioning.  Depending on results will likely need to follow back up with sports medicine/physical therapy.  Actinic keratosis Cryotherapy applied today.  See below procedure note.     Subjective:  HPI:  Patient here for follow-up.  Was last seen over a year ago.  Since her last visit he has underwent laminectomy due to spinal stenosis.  He has had continued issues with leg weakness.  Pain has improved.  Has been following with sports medicine and has had several tests over the last several months including ABIs and nerve conduction studies.  Both of these were reportedly normal.  Still has quite a bit of difficulty with ambulation.  No pain at rest.  He has been urinating more frequently.  No numbness or tingling.  He also has itchy spot on the back of bilateral legs he would like to have looked at today in a few spots on bilateral arms he would like to have looked at today as well.       Objective:  Physical Exam: BP (!) 163/93   Pulse 71   Temp 98 F (36.7 C) (Temporal)   Ht 6\' 1"  (1.854 m)   Wt 193 lb 9.6 oz (87.8 kg)   SpO2 96%   BMI 25.54 kg/m   Gen: No acute distress, resting comfortably CV: Regular rate and rhythm with no murmurs appreciated Pulm: Normal work of breathing, clear to auscultation  bilaterally with no crackles, wheezes, or rhonchi Skin: Xerosis cutis on bilateral posterior knees.  Several scattered actinic keratosis on bilateral arms and hands MSK: Bilateral lower extremities with full range of motion.  Unsteady gait noted. Neuro: Grossly normal, moves all extremities Psych: Normal affect and thought content  Cryotherapy Procedure Note  Pre-operative Diagnosis: Actinic keratosis  Locations: Bilateral Arms  Indications: Therapeutic  Procedure Details  Patient informed of risks (permanent scarring, infection, light or dark discoloration, bleeding, infection, weakness, numbness and recurrence of the lesion) and benefits of the procedure and verbal informed consent obtained.  The areas are treated with liquid nitrogen therapy, frozen until ice ball extended 3 mm beyond lesion, allowed to thaw, and treated again.  A total of 8 lesions were treated the patient tolerated procedure well.  The patient was instructed on post-op care, warned that there may be blister formation, redness and pain. Recommend OTC analgesia as needed for pain.  Condition: Stable  Complications: none.        Algis Greenhouse. Jerline Pain, MD 06/16/2020 12:41 PM

## 2020-06-16 NOTE — Patient Instructions (Signed)
It was very nice to see you today!  We will check blood work today.  Also check urine sample.  Please try using cortisone cream for the rash.  We froze a few spots on your arm today.  Take care, Dr Jerline Pain  Please try these tips to maintain a healthy lifestyle:   Eat at least 3 REAL meals and 1-2 snacks per day.  Aim for no more than 5 hours between eating.  If you eat breakfast, please do so within one hour of getting up.    Each meal should contain half fruits/vegetables, one quarter protein, and one quarter carbs (no bigger than a computer mouse)   Cut down on sweet beverages. This includes juice, soda, and sweet tea.     Drink at least 1 glass of water with each meal and aim for at least 8 glasses per day   Exercise at least 150 minutes every week.

## 2020-06-16 NOTE — Assessment & Plan Note (Signed)
Above goal.  Previously been well controlled.  Continue home monitoring goal 150/90 or lower.  We will continue current dose of lisinopril-HCTZ

## 2020-06-17 LAB — COMPREHENSIVE METABOLIC PANEL
AG Ratio: 1.5 (calc) (ref 1.0–2.5)
ALT: 27 U/L (ref 9–46)
AST: 21 U/L (ref 10–35)
Albumin: 4.3 g/dL (ref 3.6–5.1)
Alkaline phosphatase (APISO): 67 U/L (ref 35–144)
BUN: 13 mg/dL (ref 7–25)
CO2: 29 mmol/L (ref 20–32)
Calcium: 9.6 mg/dL (ref 8.6–10.3)
Chloride: 101 mmol/L (ref 98–110)
Creat: 0.75 mg/dL (ref 0.70–1.18)
Globulin: 2.8 g/dL (calc) (ref 1.9–3.7)
Glucose, Bld: 95 mg/dL (ref 65–99)
Potassium: 4.2 mmol/L (ref 3.5–5.3)
Sodium: 136 mmol/L (ref 135–146)
Total Bilirubin: 0.8 mg/dL (ref 0.2–1.2)
Total Protein: 7.1 g/dL (ref 6.1–8.1)

## 2020-06-17 LAB — CBC
HCT: 50.9 % — ABNORMAL HIGH (ref 38.5–50.0)
Hemoglobin: 16.6 g/dL (ref 13.2–17.1)
MCH: 29.2 pg (ref 27.0–33.0)
MCHC: 32.6 g/dL (ref 32.0–36.0)
MCV: 89.6 fL (ref 80.0–100.0)
MPV: 10.6 fL (ref 7.5–12.5)
Platelets: 232 10*3/uL (ref 140–400)
RBC: 5.68 10*6/uL (ref 4.20–5.80)
RDW: 13.3 % (ref 11.0–15.0)
WBC: 7.4 10*3/uL (ref 3.8–10.8)

## 2020-06-17 LAB — CK: Total CK: 85 U/L (ref 44–196)

## 2020-06-17 LAB — VITAMIN B12: Vitamin B-12: 454 pg/mL (ref 200–1100)

## 2020-06-17 LAB — URINALYSIS, ROUTINE W REFLEX MICROSCOPIC
Bilirubin Urine: NEGATIVE
Glucose, UA: NEGATIVE
Hgb urine dipstick: NEGATIVE
Ketones, ur: NEGATIVE
Leukocytes,Ua: NEGATIVE
Nitrite: NEGATIVE
Protein, ur: NEGATIVE
Specific Gravity, Urine: 1.019 (ref 1.001–1.03)
pH: 5.5 (ref 5.0–8.0)

## 2020-06-17 LAB — TSH: TSH: 2.15 mIU/L (ref 0.40–4.50)

## 2020-06-17 NOTE — Progress Notes (Signed)
Please inform patient of the following:  Blood work and urine sample are all nondiagnostic. Would recommend that he follow up with sports medicine and physical therapy as previously planned.  Algis Greenhouse. Jerline Pain, MD 06/17/2020 1:16 PM

## 2020-06-18 ENCOUNTER — Telehealth: Payer: Self-pay | Admitting: Family Medicine

## 2020-06-18 NOTE — Telephone Encounter (Signed)
Pt called asking for lab results. Pt also stated he wanted an EKG done at the office. Told pt CMA would give him a call to discuss this more. A referral for sports medicine and physical therapy will also need to be placed. Please advise.

## 2020-06-18 NOTE — Telephone Encounter (Signed)
Spoke with patient, lab results given. No Sx of chest pain. Stated will schedule appointment for CPE with PCP

## 2020-06-25 DIAGNOSIS — H04123 Dry eye syndrome of bilateral lacrimal glands: Secondary | ICD-10-CM | POA: Diagnosis not present

## 2020-06-25 DIAGNOSIS — Q150 Congenital glaucoma: Secondary | ICD-10-CM | POA: Diagnosis not present

## 2020-06-25 DIAGNOSIS — H402233 Chronic angle-closure glaucoma, bilateral, severe stage: Secondary | ICD-10-CM | POA: Diagnosis not present

## 2020-06-25 DIAGNOSIS — H02423 Myogenic ptosis of bilateral eyelids: Secondary | ICD-10-CM | POA: Diagnosis not present

## 2020-07-02 ENCOUNTER — Encounter: Payer: Self-pay | Admitting: Family Medicine

## 2020-07-08 ENCOUNTER — Ambulatory Visit (INDEPENDENT_AMBULATORY_CARE_PROVIDER_SITE_OTHER): Payer: PPO | Admitting: Family Medicine

## 2020-07-08 ENCOUNTER — Encounter: Payer: Self-pay | Admitting: Neurology

## 2020-07-08 ENCOUNTER — Encounter: Payer: Self-pay | Admitting: Family Medicine

## 2020-07-08 ENCOUNTER — Other Ambulatory Visit: Payer: Self-pay

## 2020-07-08 VITALS — BP 140/90 | HR 81 | Ht 73.0 in | Wt 195.0 lb

## 2020-07-08 DIAGNOSIS — M48061 Spinal stenosis, lumbar region without neurogenic claudication: Secondary | ICD-10-CM | POA: Diagnosis not present

## 2020-07-08 DIAGNOSIS — R29898 Other symptoms and signs involving the musculoskeletal system: Secondary | ICD-10-CM

## 2020-07-08 DIAGNOSIS — G629 Polyneuropathy, unspecified: Secondary | ICD-10-CM

## 2020-07-08 DIAGNOSIS — M79605 Pain in left leg: Secondary | ICD-10-CM

## 2020-07-08 DIAGNOSIS — M79604 Pain in right leg: Secondary | ICD-10-CM

## 2020-07-08 NOTE — Assessment & Plan Note (Signed)
This is doing highly difficult for this patient.  Significant deconditioning noted.  Patient does have a mix of likely what is peripheral neuropathy as well as potentially some lumbar spinal stenosis still status post surgery.  I would like patient to repeat a nerve conduction study.  Has had about an outside facility but I would like to see if there is any other improvement.  Patient is taking different medications still and I would make no significant changes at this time.  May need to consider the possibility of increasing Cymbalta to 30 mg but patient has had difficulty with it previously.  Patient will then follow-up with me again 7 to 8 weeks for further evaluation.  Referred back to formal physical therapy for strength and conditioning.

## 2020-07-08 NOTE — Progress Notes (Signed)
[place] [address] [place] Phone: [phone] Subjective:   I Kirk [name] am serving as a Education administrator for Dr. Hulan Saas.  This visit occurred during the SARS-CoV-2 public health emergency.  Safety protocols were in place, including screening questions prior to the visit, additional usage of staff PPE, and extensive cleaning of exam room while observing appropriate contact time as indicated for disinfecting solutions.   I'm seeing this patient by the request  of:  [name], MD  CC: Low back pain, bilateral leg weakness  CBJ:[id number]   8/3/20021 No significant improvement after the back surgery.  Do not feel the epidurals will be beneficial.  Continues to have the peripheral neuropathy consider possible nerve conduction test  Started very low-dose of Synthroid as an augmentative patient peripheral neuropathy  Patient continues to have difficulty with the neuropathy which is causing some balance and coordination issues.  Patient has significant coolness to touch of the lower extremities and even though they were normal greater than 1 year ago I do think it is worth [place] retesting the arterial blood supply and an ABI.  Patient will have this done and will follow up afterwards.  We discussed different medication choices and really other ideas would be to add Synthroid at follow-up x-rays ordered today of the pelvis to make sure nothing else is contributing to some of the pain.  We have tried treating patient with morbid degenerative disc disease with no significant improvement with the epidurals.  Continue the Cymbalta at this time.  Patient will follow up with me again in 4 to 8 weeks.  Time with patient and wife today answered all questions and discussed other management 33 minutes  Update 07/08/2020 Kirk [name] is a 79 y.o. male coming in with complaint of low back pain. States that he is not doing well. Back and leg pain and  states he can't stand on his own.   Pelvis xray 05/06/2020 IMPRESSION: No acute osseous abnormality.    Patient's most recent work-up included ABIs and venous Dopplers that were very minorly abnormal pulse of the first toe on the right foot otherwise unremarkable.  Patient has had back surgery with no significant improvement he feels with the legs as well.  Patient has not felt the epidurals have been beneficial over the course of time.  Continues to have peripheral neuropathy attempted a very low dose of Synthroid at last follow-up continuing on the Cymbalta. Patient did have x-rays of the pelvis at last exam that were independently visualized by me showing very minimal arthritic changes of the hips bilaterally otherwise unremarkable primary care has done some lab work with a fairly normal B12 level of 454 and since we have started the Synthroid patient's TSH is improved to 2.15.  We had attempted to increase Cymbalta previously from the 20 to 30 mg but patient saw side effects.  Past Medical History:  Diagnosis Date  . Cataract   . Congenital glaucoma    "both eyes operated on when I was 69 months old"  . Cough   . Disturbance of skin sensation   . Diverticulosis of colon 2014  . Diverticulum of esophagus, acquired   . Dysphagia, unspecified(787.20)   . Encounter for long-term (current) use of other medications   . Herpes zoster without mention of complication   . Hyperglycemia    patient denies  . Hyperlipidemia   . Hypertension   . Hypertrophy of prostate without urinary  obstruction and other lower urinary tract symptoms (LUTS)   . Lumbago   . Nevus, non-neoplastic   . Osteoarthrosis, unspecified whether generalized or localized, unspecified site    pt. denies  . Other premature beats   . Pain in limb   . Plantar fascial fibromatosis   . Special screening for malignant neoplasm of prostate   . Unspecified glaucoma(365.9)   . Unspecified pruritic disorder   . Unspecified  vitamin D deficiency   . Urinary frequency   . Zenker's diverticulum    Past Surgical History:  Procedure Laterality Date  . CATARACT EXTRACTION BILATERAL W/ ANTERIOR VITRECTOMY Bilateral 1977  . COLONOSCOPY  09/06/2013   [name]  . DIRECT LARYNGOSCOPY  10/22/2015   Cervical esophagoscopy. Endoscopic esophageal diverticulotomy.   Marland Kitchen [place]  . GLAUCOMA SURGERY  1945   congenital glaucoma  . HERNIA REPAIR  1993  . [place]  2008  . LARYNGOSCOPY     with stapling of zenkers diverticulum  . LUMBAR LAMINECTOMY/ DECOMPRESSION WITH MET-RX Left 08/16/2019   Procedure: Left Lumbar Two-Three Minimally Invasive Laminectomy and Microdiscectomy;  Surgeon: Judith Part, MD;  Location: [facility];  Service: Neurosurgery;  Laterality: Left;  Left Lumbar 2-3 Minimally invasive laminectomy and microdiscectomy   Social History   Socioeconomic History  . Marital status: Married    Spouse name: Not on file  . Number of children: Not on file  . Years of education: Not on file  . Highest education level: Not on file  Occupational History  . Not on file  Tobacco Use  . Smoking status: Never Smoker  . Smokeless tobacco: Never Used  Vaping Use  . Vaping Use: Never used  Substance and Sexual Activity  . Alcohol use: No  . Drug use: No  . Sexual activity: Not on file  Other Topics Concern  . Not on file  Social History Narrative   Married   Lives at home with wife Pamala Hurry)   No regular exercise, but does his own yard work and household chores.   Never smoked   Alcohol none   Social Determinants of Health   Financial Resource Strain:   . Difficulty of Paying Living Expenses: Not on file  Food Insecurity:   . Worried About Charity fundraiser in the Last Year: Not on file  . Ran Out of Food in the Last Year: Not on file  Transportation Needs:   . Lack of Transportation (Medical): Not on file  . Lack of Transportation (Non-Medical): Not on file   Physical Activity:   . Days of Exercise per Week: Not on file  . Minutes of Exercise per Session: Not on file  Stress:   . Feeling of Stress : Not on file  Social Connections:   . Frequency of Communication with Friends and Family: Not on file  . Frequency of Social Gatherings with Friends and Family: Not on file  . Attends Religious Services: Not on file  . Active Member of Clubs or Organizations: Not on file  . Attends Archivist Meetings: Not on file  . Marital Status: Not on file   Allergies  Allergen Reactions  . Reserpine Other (See Comments)    Unknown reaction  . Sulfa Antibiotics Other (See Comments)    Patient denies - Unknown reaction   Family History  Problem Relation Age of Onset  . Hypertension Mother   . Dementia Mother   . Kidney disease Father   .  CAD Father   . Colon cancer Neg Hx   . Throat cancer Neg Hx   . Diabetes Neg Hx   . Liver disease Neg Hx     Current Outpatient Medications (Endocrine & Metabolic):  .  levothyroxine (SYNTHROID) 50 MCG tablet, Take 1 tablet (50 mcg total) by mouth daily before breakfast.  Current Outpatient Medications (Cardiovascular):  .  lisinopril-hydrochlorothiazide (ZESTORETIC) 20-12.5 MG tablet, Take 0.5 tablets by mouth daily.   Current Outpatient Medications (Analgesics):  .  acetaminophen (TYLENOL) 325 MG tablet, Take 650 mg by mouth every 6 (six) hours as needed (pain).  Marland Kitchen  oxyCODONE (OXY IR/ROXICODONE) 5 MG immediate release tablet, Take 1 tablet (5 mg total) by mouth every 4 (four) hours as needed (pain).   Current Outpatient Medications (Other):  .  amoxicillin-clavulanate (AUGMENTIN) 875-125 MG tablet, Take 1 tablet by mouth 2 (two) times daily. .  brimonidine (ALPHAGAN) 0.2 % ophthalmic solution, Place 1 drop into both eyes 2 (two) times daily.  .  Cholecalciferol 25 MCG (1000 UT) tablet, Take 1,000 Units by mouth daily.  .  DULoxetine (CYMBALTA) 20 MG capsule, Take 1 capsule (20 mg total) by mouth  daily. .  timolol (TIMOPTIC) 0.5 % ophthalmic solution, Place 1 drop into the right eye daily.    Reviewed prior external information including notes and imaging from  primary care provider As well as notes that were available from care everywhere and other healthcare systems.  Past medical history, social, surgical and family history all reviewed in electronic medical record.  No pertanent information unless stated regarding to the chief complaint.   Review of Systems:  No headache, visual changes, nausea, vomiting, diarrhea, constipation, dizziness, abdominal pain, skin rash, fevers, chills, night sweats, weight loss, swollen lymph nodes, , joint swelling, chest pain, shortness of breath, mood changes. POSITIVE muscle aches, body aches  Objective  Blood pressure 140/90, pulse 81, height '6\' 1"'  (1.854 m), weight 195 lb (88.5 kg), SpO2 97 %.   General: No apparent distress alert and oriented x3 mood   Respiratory: Patient's speak in full sentences and does not appear short of breath  Cardiovascular: No lower extremity edema, non tender, no erythema  Gait using the aid of a walker Patient's low back does have loss of lordosis.  Does have stiffness noted of the lower extremities bilaterally in the back.  Unable to do much range of motion exercises secondary to the instability that patient has.  Mild leg atrophy of the legs bilaterally.  Does have what appears to be some peripheral neuropathy as well.  Patient's feet are still cold to palpation.   Impression and Recommendations:     The above documentation has been reviewed and is accurate and complete Lyndal Pulley, DO

## 2020-07-08 NOTE — Patient Instructions (Addendum)
Good to see you Bilateral nerve conduction study PT will call you See me again in 6-8 weeks

## 2020-07-22 ENCOUNTER — Other Ambulatory Visit: Payer: Self-pay

## 2020-07-22 ENCOUNTER — Encounter: Payer: Self-pay | Admitting: Family Medicine

## 2020-07-22 ENCOUNTER — Ambulatory Visit (INDEPENDENT_AMBULATORY_CARE_PROVIDER_SITE_OTHER): Payer: PPO | Admitting: Family Medicine

## 2020-07-22 VITALS — BP 132/77 | HR 85 | Temp 98.0°F | Ht 73.0 in | Wt 196.6 lb

## 2020-07-22 DIAGNOSIS — Z0001 Encounter for general adult medical examination with abnormal findings: Secondary | ICD-10-CM | POA: Diagnosis not present

## 2020-07-22 DIAGNOSIS — I10 Essential (primary) hypertension: Secondary | ICD-10-CM

## 2020-07-22 DIAGNOSIS — R7989 Other specified abnormal findings of blood chemistry: Secondary | ICD-10-CM

## 2020-07-22 DIAGNOSIS — G629 Polyneuropathy, unspecified: Secondary | ICD-10-CM | POA: Diagnosis not present

## 2020-07-22 NOTE — Progress Notes (Signed)
Chief Complaint:  Kirk [name] is a 79 y.o. male who presents today for his annual comprehensive physical exam.    Assessment/Plan:  Chronic Problems Addressed Today: Essential hypertension At goal.  Recently met and TSH within normal limits.  Continue lisinopril-HCTZ 20-12.5 half tablet daily.  TSH elevation On Synthroid 50 mcg daily.  Neuropathy ([place]) Still has significant bilateral lower extremity pain.  Has been following with sports medicine and has had lumbar spinal stenosis surgery within the past year.  He will be following up with neurology again in a few weeks for possible repeat nerve conduction study.   Preventative Healthcare: No longer needs colon cancer screening due to age.  Has received both Covid vaccines.  Flu vaccine deferred.  Patient Counseling(The following topics were reviewed and/or handout was given):  -Nutrition: Stressed importance of moderation in sodium/caffeine intake, saturated fat and cholesterol, caloric balance, sufficient intake of fresh fruits, vegetables, and fiber.  -Stressed the importance of regular exercise.   -Substance Abuse: Discussed cessation/primary prevention of tobacco, alcohol, or other drug use; driving or other dangerous activities under the influence; availability of treatment for abuse.   -Injury prevention: Discussed safety belts, safety helmets, smoke detector, smoking near bedding or upholstery.   -Sexuality: Discussed sexually transmitted diseases, partner selection, use of condoms, avoidance of unintended pregnancy and contraceptive alternatives.   -Dental health: Discussed importance of regular tooth brushing, flossing, and dental visits.  -Health maintenance and immunizations reviewed. Please refer to Health maintenance section.  Return to care in 1 year for next preventative visit.     Subjective:  HPI:  He has no acute complaints today.   Lifestyle Diet: Balanced Exercise: Limited due to leg pain.   Depression  screen PHQ 2/9 11/13/2018  Decreased Interest 0  Down, Depressed, Hopeless 0  PHQ - 2 Score 0    Health Maintenance Due  Topic Date Due  . Hepatitis C Screening  Never done  . COVID-19 Vaccine (1) Never done  . PNA vac Low Risk Adult (2 of 2 - PPSV23) 01/11/2013     ROS: Per HPI, otherwise a complete review of systems was negative.   PMH:  The following were reviewed and entered/updated in epic: Past Medical History:  Diagnosis Date  . Cataract   . Congenital glaucoma    "both eyes operated on when I was 36 months old"  . Cough   . Disturbance of skin sensation   . Diverticulosis of colon 2014  . Diverticulum of esophagus, acquired   . Dysphagia, unspecified(787.20)   . Encounter for long-term (current) use of other medications   . Herpes zoster without mention of complication   . Hyperglycemia    patient denies  . Hyperlipidemia   . Hypertension   . 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 Lumbago   . Nevus, non-neoplastic   . Osteoarthrosis, unspecified whether generalized or localized, unspecified site    pt. denies  . Other premature beats   . Pain in limb   . Plantar fascial fibromatosis   . Special screening for malignant neoplasm of prostate   . Unspecified glaucoma(365.9)   . Unspecified pruritic disorder   . Unspecified vitamin D deficiency   . Urinary frequency   . Zenker's diverticulum    Patient Active Problem List   Diagnosis Date Noted  . Actinic keratosis 06/16/2020  . Bilateral leg weakness 06/16/2020  . Onychomycosis 12/13/2019  . Herniated lumbar intervertebral disc  08/16/2019  . 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 Acute right ankle pain 07/19/2018  . TSH elevation 02/25/2016  . Orthostatic hypotension 02/17/2016  . Syncope 01/07/2016  . 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 Bifascicular block 08/20/2015  . Neuropathy ([place])  02/05/2015  . Diverticulosis of colon   . Vitamin D deficiency   . Hyperglycemia   . Dysphagia   . Diverticulum of esophagus, acquired   . Hyperlipidemia   . Glaucoma   . BPH (benign prostatic hyperplasia)   . Essential hypertension 02/01/2013  . 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   Past Surgical History:  Procedure Laterality Date  . CATARACT EXTRACTION BILATERAL W/ ANTERIOR VITRECTOMY Bilateral 1977  . COLONOSCOPY  09/06/2013   [name]  . DIRECT LARYNGOSCOPY  10/22/2015   Cervical esophagoscopy. Endoscopic esophageal diverticulotomy.   Marland Kitchen [place]  . GLAUCOMA SURGERY  1945   congenital glaucoma  . HERNIA REPAIR  1993  . [place]  2008  . LARYNGOSCOPY     with stapling of zenkers diverticulum  . LUMBAR LAMINECTOMY/ DECOMPRESSION WITH MET-RX Left 08/16/2019   Procedure: Left Lumbar Two-Three Minimally Invasive Laminectomy and Microdiscectomy;  Surgeon: Judith Part, MD;  Location: [facility];  Service: Neurosurgery;  Laterality: Left;  Left Lumbar 2-3 Minimally invasive laminectomy and microdiscectomy    Family History  Problem Relation Age of Onset  . Hypertension Mother   . Dementia Mother   . Kidney disease Father   . CAD Father   . Colon cancer Neg Hx   . Throat cancer Neg Hx   . Diabetes Neg Hx   . Liver disease Neg Hx     Medications- reviewed and updated Current Outpatient Medications  Medication Sig Dispense Refill  . acetaminophen (TYLENOL) 325 MG tablet Take 650 mg by mouth every 6 (six) hours as needed (pain).     . brimonidine (ALPHAGAN) 0.2 % ophthalmic solution Place 1 drop into both eyes 2 (two) times daily.     . Cholecalciferol 25 MCG (1000 UT) tablet Take 1,000 Units by mouth daily.     . DULoxetine (CYMBALTA) 20 MG capsule Take 1 capsule (20 mg total) by mouth daily. 30 capsule 0  . levothyroxine (SYNTHROID) 50 MCG tablet Take 1 tablet (50 mcg total) by mouth daily before breakfast. 30 tablet 3  .  lisinopril-hydrochlorothiazide (ZESTORETIC) 20-12.5 MG tablet Take 0.5 tablets by mouth daily. 90 tablet 2  . oxyCODONE (OXY IR/ROXICODONE) 5 MG immediate release tablet Take 1 tablet (5 mg total) by mouth every 4 (four) hours as needed (pain). 30 tablet 0  . timolol (TIMOPTIC) 0.5 % ophthalmic solution Place 1 drop into the right eye daily.     Marland Kitchen amoxicillin-clavulanate (AUGMENTIN) 875-125 MG tablet Take 1 tablet by mouth 2 (two) times daily. (Patient not taking: Reported on 07/22/2020) 20 tablet 0   No current facility-administered medications for this visit.    Allergies-reviewed and updated Allergies  Allergen Reactions  . Reserpine Other (See Comments)    Unknown reaction  . Sulfa Antibiotics Other (See Comments)    Patient denies - Unknown reaction    Social History   Socioeconomic History  . Marital status: Married    Spouse name: Not on file  . Number of children: Not on file  . Years of education: Not on file  . Highest education level: Not on file  Occupational History  . Not on file  Tobacco Use  . Smoking status: Never Smoker  .  Smokeless tobacco: Never Used  Vaping Use  . Vaping Use: Never used  Substance and Sexual Activity  . Alcohol use: No  . Drug use: No  . Sexual activity: Not on file  Other Topics Concern  . Not on file  Social History Narrative   Married   Lives at home with wife Pamala Hurry)   No regular exercise, but does his own yard work and household chores.   Never smoked   Alcohol none   Social Determinants of Health   Financial Resource Strain:   . Difficulty of Paying Living Expenses: Not on file  Food Insecurity:   . Worried About Charity fundraiser in the Last Year: Not on file  . Ran Out of Food in the Last Year: Not on file  Transportation Needs:   . Lack of Transportation (Medical): Not on file  . Lack of Transportation (Non-Medical): Not on file  Physical Activity:   . Days of Exercise per Week: Not on file  . Minutes of  Exercise per Session: Not on file  Stress:   . Feeling of Stress : Not on file  Social Connections:   . Frequency of Communication with Friends and Family: Not on file  . Frequency of Social Gatherings with Friends and Family: Not on file  . Attends Religious Services: Not on file  . Active Member of Clubs or Organizations: Not on file  . Attends Archivist Meetings: Not on file  . Marital Status: Not on file        Objective:  Physical Exam: BP 132/77   Pulse 85   Temp 98 F (36.7 C) (Temporal)   Ht _0  (1.854 m)   Wt 196 lb 9.6 oz (89.2 kg)   SpO2 95%   BMI 25.94 kg/m   Body mass index is 25.94 kg/m. Wt Readings from Last 3 Encounters:  07/22/20 196 lb 9.6 oz (89.2 kg)  07/08/20 195 lb (88.5 kg)  06/16/20 193 lb 9.6 oz (87.8 kg)   Gen: NAD, resting comfortably HEENT: TMs normal bilaterally. OP clear. No thyromegaly noted.  CV: RRR with no murmurs appreciated Pulm: NWOB, CTAB with no crackles, wheezes, or rhonchi GI: Normal bowel sounds present. Soft, Nontender, Nondistended. MSK: no edema, cyanosis, or clubbing noted Skin: warm, dry Neuro: CN2-12 grossly intact. Strength 5/5 in upper and lower extremities. Reflexes symmetric and intact bilaterally.  Psych: Normal affect and thought content   EKG: NSR. No ischemic changes. 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      Algis Greenhouse. Jerline Pain, MD 07/22/2020 4:38 PM

## 2020-07-22 NOTE — Assessment & Plan Note (Signed)
Still has significant bilateral lower extremity pain.  Has been following with sports medicine and has had lumbar spinal stenosis surgery within the past year.  He will be following up with neurology again in a few weeks for possible repeat nerve conduction study.

## 2020-07-22 NOTE — Assessment & Plan Note (Signed)
On Synthroid 50 mcg daily.

## 2020-07-22 NOTE — Patient Instructions (Signed)
It was very nice to see you today!  No changes today.  Your EKG is stable.   You are due for your next physical in 1 year.   Take care, Dr Jerline Pain  Please try these tips to maintain a healthy lifestyle:   Eat at least 3 REAL meals and 1-2 snacks per day.  Aim for no more than 5 hours between eating.  If you eat breakfast, please do so within one hour of getting up.    Each meal should contain half fruits/vegetables, one quarter protein, and one quarter carbs (no bigger than a computer mouse)   Cut down on sweet beverages. This includes juice, soda, and sweet tea.     Drink at least 1 glass of water with each meal and aim for at least 8 glasses per day   Exercise at least 150 minutes every week.    Preventive Care 36 Years and Older, Male Preventive care refers to lifestyle choices and visits with your health care provider that can promote health and wellness. This includes:  A yearly physical exam. This is also called an annual well check.  Regular dental and eye exams.  Immunizations.  Screening for certain conditions.  Healthy lifestyle choices, such as diet and exercise. What can I expect for my preventive care visit? Physical exam Your health care provider will check:  Height and weight. These may be used to calculate body mass index (BMI), which is a measurement that tells if you are at a healthy weight.  Heart rate and blood pressure.  Your skin for abnormal spots. Counseling Your health care provider may ask you questions about:  Alcohol, tobacco, and drug use.  Emotional well-being.  Home and relationship well-being.  Sexual activity.  Eating habits.  History of falls.  Memory and ability to understand (cognition).  Work and work Statistician. What immunizations do I need?  Influenza (flu) vaccine  This is recommended every year. Tetanus, diphtheria, and pertussis (Tdap) vaccine  You may need a Td booster every 10 years. Varicella  (chickenpox) vaccine  You may need this vaccine if you have not already been vaccinated. Zoster (shingles) vaccine  You may need this after age 61. Pneumococcal conjugate (PCV13) vaccine  One dose is recommended after age 54. Pneumococcal polysaccharide (PPSV23) vaccine  One dose is recommended after age 39. Measles, mumps, and rubella (MMR) vaccine  You may need at least one dose of MMR if you were born in 1957 or later. You may also need a second dose. Meningococcal conjugate (MenACWY) vaccine  You may need this if you have certain conditions. Hepatitis A vaccine  You may need this if you have certain conditions or if you travel or work in places where you may be exposed to hepatitis A. Hepatitis B vaccine  You may need this if you have certain conditions or if you travel or work in places where you may be exposed to hepatitis B. Haemophilus influenzae type b (Hib) vaccine  You may need this if you have certain conditions. You may receive vaccines as individual doses or as more than one vaccine together in one shot (combination vaccines). Talk with your health care provider about the risks and benefits of combination vaccines. What tests do I need? Blood tests  Lipid and cholesterol levels. These may be checked every 5 years, or more frequently depending on your overall health.  Hepatitis C test.  Hepatitis B test. Screening  Lung cancer screening. You may have this screening every year  starting at age 77 if you have a 30-pack-year history of smoking and currently smoke or have quit within the past 15 years.  Colorectal cancer screening. All adults should have this screening starting at age 14 and continuing until age 21. Your health care provider may recommend screening at age 63 if you are at increased risk. You will have tests every 1-10 years, depending on your results and the type of screening test.  Prostate cancer screening. Recommendations will vary depending on  your family history and other risks.  Diabetes screening. This is done by checking your blood sugar (glucose) after you have not eaten for a while (fasting). You may have this done every 1-3 years.  Abdominal aortic aneurysm (AAA) screening. You may need this if you are a current or former smoker.  Sexually transmitted disease (STD) testing. Follow these instructions at home: Eating and drinking  Eat a diet that includes fresh fruits and vegetables, whole grains, lean protein, and low-fat dairy products. Limit your intake of foods with high amounts of sugar, saturated fats, and salt.  Take vitamin and mineral supplements as recommended by your health care provider.  Do not drink alcohol if your health care provider tells you not to drink.  If you drink alcohol: ? Limit how much you have to 0-2 drinks a day. ? Be aware of how much alcohol is in your drink. In the [place] one drink equals one 12 oz bottle of beer (355 mL), one 5 oz glass of wine (148 mL), or one 1 oz glass of hard liquor (44 mL). Lifestyle  Take daily care of your teeth and gums.  Stay active. Exercise for at least 30 minutes on 5 or more days each week.  Do not use any products that contain nicotine or tobacco, such as cigarettes, e-cigarettes, and chewing tobacco. If you need help quitting, ask your health care provider.  If you are sexually active, practice safe sex. Use a condom or other form of protection to prevent STIs (sexually transmitted infections).  Talk with your health care provider about taking a low-dose aspirin or statin. What's next?  Visit your health care provider once a year for a well check visit.  Ask your health care provider how often you should have your eyes and teeth checked.  Stay up to date on all vaccines. This information is not intended to replace advice given to you by your health care provider. Make sure you discuss any questions you have with your health care provider. Document  Revised: 09/14/2018 Document Reviewed: 09/14/2018 Elsevier Patient Education  2020 [employer].

## 2020-07-22 NOTE — Assessment & Plan Note (Signed)
At goal.  Recently met and TSH within normal limits.  Continue lisinopril-HCTZ 20-12.5 half tablet daily. 

## 2020-07-24 ENCOUNTER — Other Ambulatory Visit: Payer: Self-pay | Admitting: Family Medicine

## 2020-07-30 ENCOUNTER — Ambulatory Visit: Payer: PPO | Admitting: Physical Therapy

## 2020-07-30 ENCOUNTER — Encounter: Payer: Self-pay | Admitting: Physical Therapy

## 2020-07-30 DIAGNOSIS — R2689 Other abnormalities of gait and mobility: Secondary | ICD-10-CM

## 2020-07-30 DIAGNOSIS — M6281 Muscle weakness (generalized): Secondary | ICD-10-CM

## 2020-07-31 ENCOUNTER — Encounter: Payer: Self-pay | Admitting: Physical Therapy

## 2020-07-31 NOTE — Therapy (Signed)
[facility] [address] [place], [place], [address] Phone: [phone]   Fax:  [phone]  Physical Therapy Evaluation  Patient Details  Name: Kirk [name] MRN: [id number] Date of Birth: 10-30-1940 Referring Provider (PT): zach Tamala Julian   Encounter Date: 07/30/2020   PT End of Session - 07/31/20 0819    Visit Number 1    Number of Visits 12    Date for PT Re-Evaluation 09/24/20    Authorization Type HTA    PT Start Time 1430    PT Stop Time 1513    PT Time Calculation (min) 43 min    Equipment Utilized During Treatment Gait belt    Activity Tolerance Patient tolerated treatment well    Behavior During Therapy [facility] for tasks assessed/performed           Past Medical History:  Diagnosis Date  . Cataract   . Congenital glaucoma    "both eyes operated on when I was 24 months old"  . Cough   . Disturbance of skin sensation   . Diverticulosis of colon 2014  . Diverticulum of esophagus, acquired   . Dysphagia, unspecified(787.20)   . Encounter for long-term (current) use of other medications   . Herpes zoster without mention of complication   . Hyperglycemia    patient denies  . Hyperlipidemia   . Hypertension   . 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 Lumbago   . Nevus, non-neoplastic   . Osteoarthrosis, unspecified whether generalized or localized, unspecified site    pt. denies  . Other premature beats   . Pain in limb   . Plantar fascial fibromatosis   . Special screening for malignant neoplasm of prostate   . Unspecified glaucoma(365.9)   . Unspecified pruritic disorder   . Unspecified vitamin D deficiency   . Urinary frequency   . Zenker's diverticulum     Past Surgical History:  Procedure Laterality Date  . CATARACT EXTRACTION BILATERAL W/ ANTERIOR VITRECTOMY Bilateral 1977  . COLONOSCOPY  09/06/2013   [name]  . DIRECT LARYNGOSCOPY  10/22/2015    Cervical esophagoscopy. Endoscopic esophageal diverticulotomy.   Marland Kitchen [place]  . GLAUCOMA SURGERY  1945   congenital glaucoma  . HERNIA REPAIR  1993  . [place]  2008  . LARYNGOSCOPY     with stapling of zenkers diverticulum  . LUMBAR LAMINECTOMY/ DECOMPRESSION WITH MET-RX Left 08/16/2019   Procedure: Left Lumbar Two-Three Minimally Invasive Laminectomy and Microdiscectomy;  Surgeon: Judith Part, MD;  Location: [facility];  Service: Neurosurgery;  Laterality: Left;  Left Lumbar 2-3 Minimally invasive laminectomy and microdiscectomy    There were no vitals filed for this visit.    Subjective Assessment - 07/30/20 1438    Subjective Pt states ongoing difficulty with weakness in legs, and with standing/ ambulation. Balance and ability to stand worsening. He was seen previously in PT for same issues. He then had injections and also in Nov 2020 had laminectomy for decompression of stenosis. Has also had nerve conduction test,(negative)  having another nov 10. Feet cold. States poor balance, weak legs, "cant stand up" for more than 10 min. Reports no falls, very reliant on 4 [employer].    Patient Stated Goals Improved strength of legs, walking, balance    Currently in Pain? Yes    Pain Score 2     Pain Location Leg    Pain Orientation Right;Left  Pain Descriptors / Indicators Numbness    Pain Type Chronic pain    Pain Onset More than a month ago    Pain Frequency Intermittent    Aggravating Factors  increased standing    Pain Relieving Factors sitting              [facility] PT Assessment - 07/31/20 0001      Assessment   Medical Diagnosis Gait/LE weakness    Referring Provider (PT) zach Tamala Julian    Prior Therapy yes/ last year      Precautions   Precautions Fall      Balance Screen   Has the patient fallen in the past 6 months No      Prior Function   Level of Independence Independent with household mobility with device      Cognition   Overall  Cognitive Status Within Functional Limits for tasks assessed      ROM / Strength   AROM / PROM / Strength AROM;Strength      AROM   Overall AROM Comments lumbar: mod limtations for all motions/ stiffness:  hips: mild limitations for rot and ext;        Strength   Overall Strength Comments HIps: 4-/5; Knee: 4/5 : DF: 3/5       Palpation   Palpation comment Sig tightness/limitations in bil Hamstrings                       Objective measurements completed on examination: See above findings.       [facility] Adult PT Treatment/Exercise - 07/31/20 0001      Exercises   Exercises Knee/Hip      Knee/Hip Exercises: Stretches   Active Hamstring Stretch Limitations add next visit       Knee/Hip Exercises: Standing   Knee Flexion 20 reps    Hip Abduction 10 reps;Both      Knee/Hip Exercises: Seated   Sit to Sand 5 reps                  PT Education - 07/31/20 0819    Education Details Discussed home safety, need for use of 4WW.    Person(s) Educated Patient    Methods Explanation;Demonstration;Tactile cues;Verbal cues    Comprehension Verbalized understanding;Returned demonstration;Verbal cues required;Tactile cues required;Need further instruction            PT Short Term Goals - 07/31/20 0821      PT SHORT TERM GOAL #1   Title Pt to be independent with initial HEP    Time 2    Period Weeks    Status New    Target Date 08/13/20             PT Long Term Goals - 07/31/20 0821      PT LONG TERM GOAL #1   Title Pt to be indepedent with final HEP for back and LEs    Time 8    Period Weeks    Status New    Target Date 09/24/20      PT LONG TERM GOAL #2   Title Pt to demo improved strength of bil hips and knees, to at least 4+/5 to improve stability and gait.    Time 8    Period Weeks    Status New    Target Date 09/24/20      PT LONG TERM GOAL #3   Title Pt to demo independent/safe ability to put walker in car  and safely get into car.     Time 8    Period Weeks    Status New    Target Date 09/24/20      PT LONG TERM GOAL #4   Title Pt to demo improved static balance without AD, to be rated at least (good), for improved ability for ADLS.    Time 8    Period Weeks    Status New    Target Date 09/24/20      PT LONG TERM GOAL #5   Title Pt to demo independent/safe navigation of at least 5 stairs, with hand rail, for improved safety at home and in community    Time 8    Period Weeks    Status New    Target Date 09/24/20                  Plan - 07/31/20 0829    Clinical Impression Statement Pt presents with primary complaint of increased weakness in LEs and difficulty with gait and functional activity. Pt has had treatment/surgery for stenosis, but continues to have symptoms into LEs that are effecting mobility. Pt with mild weakness in LEs with strength testing, but has significant decrease in mobility with longer duration standing/walking. Pt has not had any falls, but is a fall risk, and very reliant on walker. Pt with significant difficulty and unable to ambulate without walker. Pt with decreased ability for sustaining standing and ambulation, and also has poor static and dynamic balance. Pt will benefit from skilled PT to improve deficits, and safety .    Personal Factors and Comorbidities Comorbidity 1    Comorbidities stenosis    Examination-Activity Limitations Locomotion Level;Bend;Squat;Stairs;Stand    Examination-Participation Restrictions Cleaning;Community Activity;Shop;Yard Work    Merchant navy officer Evolving/Moderate complexity    Clinical Decision Making Moderate    Rehab Potential Good    PT Frequency 2x / week    PT Duration 8 weeks    PT Treatment/Interventions ADLs/Self Care Home Management;Cryotherapy;Electrical Stimulation;Gait training;DME Instruction;Ultrasound;Traction;Moist Heat;Stair training;Functional mobility training;Iontophoresis 74m/ml Dexamethasone;Therapeutic  activities;Therapeutic exercise;Balance training;Neuromuscular re-education;Manual techniques;Orthotic Fit/Training;Patient/family education;Passive range of motion;Wheelchair mobility training;Dry needling;Taping;Joint Manipulations;Spinal Manipulations;Visual/perceptual remediation/compensation    Consulted and Agree with Plan of Care Patient           Patient will benefit from skilled therapeutic intervention in order to improve the following deficits and impairments:  Abnormal gait, Decreased coordination, Difficulty walking, Decreased safety awareness, Decreased endurance, Decreased activity tolerance, Pain, Impaired vision/preception, Decreased balance, Decreased strength, Decreased mobility  Visit Diagnosis: Muscle weakness (generalized)  Other abnormalities of gait and mobility     Problem List Patient Active Problem List   Diagnosis Date Noted  . Actinic keratosis 06/16/2020  . Bilateral leg weakness 06/16/2020  . Onychomycosis 12/13/2019  . Herniated lumbar intervertebral disc 08/16/2019  . 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 Acute right ankle pain 07/19/2018  . TSH elevation 02/25/2016  . Orthostatic hypotension 02/17/2016  . Syncope 01/07/2016  . 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 Bifascicular block 08/20/2015  . Neuropathy ([place] 02/05/2015  . Diverticulosis of colon   . Vitamin D deficiency   . Hyperglycemia   . Dysphagia   . Diverticulum of esophagus, acquired   . Hyperlipidemia   . Glaucoma   . BPH (benign prostatic hyperplasia)   . Essential hypertension 02/01/2013  . 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    LLyndee Hensen PT, DPT 8:37 AM  07/31/20    [facility]  [place], [place], [address] Phone: [phone]   Fax:  [phone]  Name: DONN ZANETTI MRN: [id number] Date of Birth: 07-Jan-1941

## 2020-08-05 ENCOUNTER — Ambulatory Visit (INDEPENDENT_AMBULATORY_CARE_PROVIDER_SITE_OTHER): Payer: PPO | Admitting: Physical Therapy

## 2020-08-05 ENCOUNTER — Other Ambulatory Visit: Payer: Self-pay

## 2020-08-05 DIAGNOSIS — M6281 Muscle weakness (generalized): Secondary | ICD-10-CM | POA: Diagnosis not present

## 2020-08-05 DIAGNOSIS — R2689 Other abnormalities of gait and mobility: Secondary | ICD-10-CM | POA: Diagnosis not present

## 2020-08-06 ENCOUNTER — Encounter: Payer: Self-pay | Admitting: Physical Therapy

## 2020-08-06 NOTE — Therapy (Signed)
[facility] [address] [place], [place], [address] Phone: [phone]   Fax:  [phone]  Physical Therapy Treatment  Patient Details  Name: Kirk [name] MRN: [id number] Date of Birth: 19-Jun-1941 Referring Provider (PT): zach Tamala Julian   Encounter Date: 08/05/2020   PT End of Session - 08/06/20 1524    Visit Number 2    Number of Visits 12    Date for PT Re-Evaluation 09/24/20    Authorization Type HTA    PT Start Time 1345    PT Stop Time 1424    PT Time Calculation (min) 39 min    Equipment Utilized During Treatment Gait belt    Activity Tolerance Patient tolerated treatment well    Behavior During Therapy [facility] for tasks assessed/performed           Past Medical History:  Diagnosis Date  . Cataract   . Congenital glaucoma    "both eyes operated on when I was 18 months old"  . Cough   . Disturbance of skin sensation   . Diverticulosis of colon 2014  . Diverticulum of esophagus, acquired   . Dysphagia, unspecified(787.20)   . Encounter for long-term (current) use of other medications   . Herpes zoster without mention of complication   . Hyperglycemia    patient denies  . Hyperlipidemia   . Hypertension   . 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 Lumbago   . Nevus, non-neoplastic   . Osteoarthrosis, unspecified whether generalized or localized, unspecified site    pt. denies  . Other premature beats   . Pain in limb   . Plantar fascial fibromatosis   . Special screening for malignant neoplasm of prostate   . Unspecified glaucoma(365.9)   . Unspecified pruritic disorder   . Unspecified vitamin D deficiency   . Urinary frequency   . Zenker's diverticulum     Past Surgical History:  Procedure Laterality Date  . CATARACT EXTRACTION BILATERAL W/ ANTERIOR VITRECTOMY Bilateral 1977  . COLONOSCOPY  09/06/2013   [name]  . DIRECT LARYNGOSCOPY  10/22/2015    Cervical esophagoscopy. Endoscopic esophageal diverticulotomy.   Marland Kitchen [place]  . GLAUCOMA SURGERY  1945   congenital glaucoma  . HERNIA REPAIR  1993  . [place]  2008  . LARYNGOSCOPY     with stapling of zenkers diverticulum  . LUMBAR LAMINECTOMY/ DECOMPRESSION WITH MET-RX Left 08/16/2019   Procedure: Left Lumbar Two-Three Minimally Invasive Laminectomy and Microdiscectomy;  Surgeon: Judith Part, MD;  Location: [facility];  Service: Neurosurgery;  Laterality: Left;  Left Lumbar 2-3 Minimally invasive laminectomy and microdiscectomy    There were no vitals filed for this visit.   Subjective Assessment - 08/06/20 1522    Subjective Pt states legs feel very weak when standing for a few min, feels very unsafe and unable to stand without RW.    Currently in Pain? Yes    Pain Score 2     Pain Location Leg    Pain Orientation Right;Left    Pain Type Chronic pain    Pain Onset More than a month ago    Pain Frequency Intermittent                             [facility] Adult PT Treatment/Exercise - 08/06/20 0001      Knee/Hip Exercises: Standing   Hip  Flexion 20 reps;Knee bent    Hip Abduction 10 reps;2 sets;Both    Gait Training 35 ft with 4WW x 6, height of walker increased for improved trunk posture     Other Standing Knee Exercises Static standing(for balance) DLS, and bil staggered stance 30 sec x 2 ea;     Other Standing Knee Exercises L/R and A/P weight shifts x 20 ea       Knee/Hip Exercises: Seated   Long Arc Quad 20 reps;Both    Other Seated Knee/Hip Exercises seated HR x 10;  seated PF with RTB x 10 ;    Sit to [employer] 10 reps      Knee/Hip Exercises: Supine   Short Arc Quad Sets 20 reps;Right    Straight Leg Raises 20 reps;Both                  PT Education - 08/06/20 1524    Education Details HEP updated    Person(s) Educated Patient    Methods Explanation;Demonstration;Tactile cues;Verbal cues;Handout     Comprehension Verbalized understanding;Returned demonstration;Verbal cues required;Tactile cues required;Need further instruction            PT Short Term Goals - 07/31/20 0821      PT SHORT TERM GOAL #1   Title Pt to be independent with initial HEP    Time 2    Period Weeks    Status New    Target Date 08/13/20             PT Long Term Goals - 07/31/20 0821      PT LONG TERM GOAL #1   Title Pt to be indepedent with final HEP for back and LEs    Time 8    Period Weeks    Status New    Target Date 09/24/20      PT LONG TERM GOAL #2   Title Pt to demo improved strength of bil hips and knees, to at least 4+/5 to improve stability and gait.    Time 8    Period Weeks    Status New    Target Date 09/24/20      PT LONG TERM GOAL #3   Title Pt to demo independent/safe ability to put walker in car and safely get into car.    Time 8    Period Weeks    Status New    Target Date 09/24/20      PT LONG TERM GOAL #4   Title Pt to demo improved static balance without AD, to be rated at least (good), for improved ability for ADLS.    Time 8    Period Weeks    Status New    Target Date 09/24/20      PT LONG TERM GOAL #5   Title Pt to demo independent/safe navigation of at least 5 stairs, with hand rail, for improved safety at home and in community    Time 8    Period Weeks    Status New    Target Date 09/24/20                 Plan - 08/06/20 1525    Clinical Impression Statement Pt with noted signficant weakness in R PF, likley causing R knee to flex and feel weak in standing. Reviewed HEP today, handout given for PF and LE strength. Pt with diffiuclty standing, due to instabiliy,  without RW. Practice for static standing with different foot positions today. Plan to  progress LE strength and stability as tolerated.    Personal Factors and Comorbidities Comorbidity 1    Comorbidities stenosis    Examination-Activity Limitations Locomotion  Level;Bend;Squat;Stairs;Stand    Examination-Participation Restrictions Cleaning;Community Activity;Shop;Yard Work    Merchant navy officer Evolving/Moderate complexity    Rehab Potential Good    PT Frequency 2x / week    PT Duration 8 weeks    PT Treatment/Interventions ADLs/Self Care Home Management;Cryotherapy;Electrical Stimulation;Gait training;DME Instruction;Ultrasound;Traction;Moist Heat;Stair training;Functional mobility training;Iontophoresis 78m/ml Dexamethasone;Therapeutic activities;Therapeutic exercise;Balance training;Neuromuscular re-education;Manual techniques;Orthotic Fit/Training;Patient/family education;Passive range of motion;Wheelchair mobility training;Dry needling;Taping;Joint Manipulations;Spinal Manipulations;Visual/perceptual remediation/compensation    Consulted and Agree with Plan of Care Patient           Patient will benefit from skilled therapeutic intervention in order to improve the following deficits and impairments:  Abnormal gait, Decreased coordination, Difficulty walking, Decreased safety awareness, Decreased endurance, Decreased activity tolerance, Pain, Impaired vision/preception, Decreased balance, Decreased strength, Decreased mobility  Visit Diagnosis: Muscle weakness (generalized)  Other abnormalities of gait and mobility     Problem List Patient Active Problem List   Diagnosis Date Noted  . Actinic keratosis 06/16/2020  . Bilateral leg weakness 06/16/2020  . Onychomycosis 12/13/2019  . Herniated lumbar intervertebral disc 08/16/2019  . 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 Acute right ankle pain 07/19/2018  . TSH elevation 02/25/2016  . Orthostatic hypotension 02/17/2016  . Syncope 01/07/2016  . 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 Bifascicular block 08/20/2015  . Neuropathy ([place] 02/05/2015  . Diverticulosis of colon   . Vitamin D deficiency   . Hyperglycemia   .  Dysphagia   . Diverticulum of esophagus, acquired   . Hyperlipidemia   . Glaucoma   . BPH (benign prostatic hyperplasia)   . Essential hypertension 02/01/2013  . 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    LLyndee Hensen PT, DPT 3:32 PM  08/06/20    [facility] [place] [address]Phone: [phone]  Fax:  [phone] Name: JSHREYAN HINZMRN: [id number] of Birth: 305/20/1942

## 2020-08-13 ENCOUNTER — Encounter: Payer: PPO | Admitting: Neurology

## 2020-08-15 ENCOUNTER — Ambulatory Visit: Payer: PPO | Admitting: Physical Therapy

## 2020-08-15 ENCOUNTER — Encounter: Payer: Self-pay | Admitting: Physical Therapy

## 2020-08-15 ENCOUNTER — Other Ambulatory Visit: Payer: Self-pay

## 2020-08-15 DIAGNOSIS — R2689 Other abnormalities of gait and mobility: Secondary | ICD-10-CM

## 2020-08-15 DIAGNOSIS — M6281 Muscle weakness (generalized): Secondary | ICD-10-CM | POA: Diagnosis not present

## 2020-08-18 ENCOUNTER — Encounter: Payer: Self-pay | Admitting: Physical Therapy

## 2020-08-18 NOTE — Therapy (Signed)
[facility] [address] [place], [place], [address] Phone: [phone]   Fax:  [phone]  Physical Therapy Treatment  Patient Details  Name: Kirk [name] MRN: [id number] Date of Birth: 1941-03-03 Referring Provider (PT): zach Tamala Julian   Encounter Date: 08/15/2020   PT End of Session - 08/18/20 2131    Visit Number 3    Number of Visits 12    Date for PT Re-Evaluation 09/24/20    Authorization Type HTA    PT Start Time 1300    PT Stop Time 9528    PT Time Calculation (min) 45 min    Equipment Utilized During Treatment Gait belt    Activity Tolerance Patient tolerated treatment well    Behavior During Therapy WFL for tasks assessed/performed           Past Medical History:  Diagnosis Date   Cataract    Congenital glaucoma    "both eyes operated on when I was 45 months old"   Cough    Disturbance of skin sensation    Diverticulosis of colon 2014   Diverticulum of esophagus, acquired    Dysphagia, unspecified(787.20)    Encounter for long-term (current) use of other medications    Herpes zoster without mention of complication    Hyperglycemia    patient denies   Hyperlipidemia    Hypertension    Hypertrophy of prostate without urinary obstruction and other lower urinary tract symptoms (LUTS)    Lumbago    Nevus, non-neoplastic    Osteoarthrosis, unspecified whether generalized or localized, unspecified site    pt. denies   Other premature beats    Pain in limb    Plantar fascial fibromatosis    Special screening for malignant neoplasm of prostate    Unspecified glaucoma(365.9)    Unspecified pruritic disorder    Unspecified vitamin D deficiency    Urinary frequency    Zenker's diverticulum     Past Surgical History:  Procedure Laterality Date   CATARACT EXTRACTION BILATERAL W/ ANTERIOR VITRECTOMY Bilateral 1977   COLONOSCOPY  09/06/2013   [name]   DIRECT LARYNGOSCOPY  10/22/2015    Cervical esophagoscopy. Endoscopic esophageal diverticulotomy.    [place]   congenital glaucoma   HERNIA REPAIR  1993   INCISIONAL HERNIA REPAIR  2008   LARYNGOSCOPY     with stapling of zenkers diverticulum   LUMBAR LAMINECTOMY/ DECOMPRESSION WITH MET-RX Left 08/16/2019   Procedure: Left Lumbar Two-Three Minimally Invasive Laminectomy and Microdiscectomy;  Surgeon: Judith Part, MD;  Location: [facility];  Service: Neurosurgery;  Laterality: Left;  Left Lumbar 2-3 Minimally invasive laminectomy and microdiscectomy    There were no vitals filed for this visit.   Subjective Assessment - 08/18/20 2131    Subjective No new complaints.    Currently in Pain? Yes    Pain Onset More than a month ago                             [facility] Adult PT Treatment/Exercise - 08/18/20 0001      Knee/Hip Exercises: Aerobic   Recumbent Bike L1 x 5 min; R foot/heel assist, R foot plantar flexion too weak to hold pressure on pedal.      Knee/Hip Exercises: Standing   Hip Flexion 20 reps;Knee bent    Hip Abduction 10 reps;2 sets;Both    Functional Squat 15 reps  Gait Training 35 ft with 4WW x 6, education on more upright posture     Other Standing Knee Exercises Standing without UE support, L/R and up/down head turns,  x 20 ea       Knee/Hip Exercises: Seated   Long Arc Quad 20 reps;Both    Other Seated Knee/Hip Exercises   seated PF with RTB x 30 ;    Sit to [employer] 10 reps      Knee/Hip Exercises: Supine   Short Arc Quad Sets 20 reps;Right    Bridges 15 reps    Straight Leg Raises Both;10 reps      Manual Therapy   Manual therapy comments Long leg distraction bil for lumbar pump x2 min ea;                     PT Short Term Goals - 07/31/20 5956      PT SHORT TERM GOAL #1   Title Pt to be independent with initial HEP    Time 2    Period Weeks    Status New    Target Date 08/13/20             PT Long  Term Goals - 07/31/20 3875      PT LONG TERM GOAL #1   Title Pt to be indepedent with final HEP for back and LEs    Time 8    Period Weeks    Status New    Target Date 09/24/20      PT LONG TERM GOAL #2   Title Pt to demo improved strength of bil hips and knees, to at least 4+/5 to improve stability and gait.    Time 8    Period Weeks    Status New    Target Date 09/24/20      PT LONG TERM GOAL #3   Title Pt to demo independent/safe ability to put walker in car and safely get into car.    Time 8    Period Weeks    Status New    Target Date 09/24/20      PT LONG TERM GOAL #4   Title Pt to demo improved static balance without AD, to be rated at least (good), for improved ability for ADLS.    Time 8    Period Weeks    Status New    Target Date 09/24/20      PT LONG TERM GOAL #5   Title Pt to demo independent/safe navigation of at least 5 stairs, with hand rail, for improved safety at home and in community    Time 8    Period Weeks    Status New    Target Date 09/24/20                 Plan - 08/18/20 2132    Clinical Impression Statement Pt very challenged with static standing without UE support. Pt req mod/max cueing for performing ther ex with correct mechanics today. Plan to focus on continued LE strength and stability for standing /gait.    Personal Factors and Comorbidities Comorbidity 1    Comorbidities stenosis    Examination-Activity Limitations Locomotion Level;Bend;Squat;Stairs;Stand    Examination-Participation Restrictions Cleaning;Community Activity;Shop;Yard Work    Merchant [employer] officer Evolving/Moderate complexity    Rehab Potential Good    PT Frequency 2x / week    PT Duration 8 weeks    PT Treatment/Interventions ADLs/Self Care Home Management;Cryotherapy;Electrical Stimulation;Gait training;DME Instruction;Ultrasound;Traction;Moist Heat;Stair training;Functional  mobility training;Iontophoresis 38m/ml Dexamethasone;Therapeutic  activities;Therapeutic exercise;Balance training;Neuromuscular re-education;Manual techniques;Orthotic Fit/Training;Patient/family education;Passive range of motion;Wheelchair mobility training;Dry needling;Taping;Joint Manipulations;Spinal Manipulations;Visual/perceptual remediation/compensation    Consulted and Agree with Plan of Care Patient           Patient will benefit from skilled therapeutic intervention in order to improve the following deficits and impairments:  Abnormal gait, Decreased coordination, Difficulty walking, Decreased safety awareness, Decreased endurance, Decreased activity tolerance, Pain, Impaired vision/preception, Decreased balance, Decreased strength, Decreased mobility  Visit Diagnosis: Muscle weakness (generalized)  Other abnormalities of gait and mobility     Problem List Patient Active Problem List   Diagnosis Date Noted   Actinic keratosis 06/16/2020   Bilateral leg weakness 06/16/2020   Onychomycosis 12/13/2019   Herniated lumbar intervertebral disc 08/16/2019   Degenerative lumbar spinal stenosis 02/14/2019   Acute right ankle pain 07/19/2018   TSH elevation 02/25/2016   Orthostatic hypotension 02/17/2016   Syncope 01/07/2016   Esophageal diverticulum, acquired 10/22/2015   Xeroderma 08/20/2015   Bifascicular block 08/20/2015   [place] ([place] 02/05/[place]   Diverticulosis of colon    Vitamin D deficiency    Hyperglycemia    Dysphagia    Diverticulum of esophagus, acquired    Hyperlipidemia    Glaucoma    BPH (benign prostatic hyperplasia)    Essential hypertension 02/01/2013   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   LLyndee Hensen PT, DPT 9:39 PM  08/18/20   CFort Thompson[address]RL[address] [place] [address]Phone: [phone]  Fax:  [phone] Name: Kirk LAROUCHEMRN: [id number] of Birth: 3Aug 07, 1942

## 2020-08-19 ENCOUNTER — Ambulatory Visit: Payer: PPO | Admitting: Physical Therapy

## 2020-08-19 ENCOUNTER — Other Ambulatory Visit: Payer: Self-pay

## 2020-08-19 DIAGNOSIS — R2689 Other abnormalities of gait and mobility: Secondary | ICD-10-CM

## 2020-08-19 DIAGNOSIS — M6281 Muscle weakness (generalized): Secondary | ICD-10-CM

## 2020-08-21 ENCOUNTER — Other Ambulatory Visit: Payer: Self-pay

## 2020-08-21 ENCOUNTER — Ambulatory Visit: Payer: PPO | Admitting: Family Medicine

## 2020-08-21 ENCOUNTER — Encounter: Payer: PPO | Admitting: Physical Therapy

## 2020-08-21 ENCOUNTER — Ambulatory Visit: Payer: PPO | Admitting: Physical Therapy

## 2020-08-21 DIAGNOSIS — R2689 Other abnormalities of gait and mobility: Secondary | ICD-10-CM | POA: Diagnosis not present

## 2020-08-21 DIAGNOSIS — M6281 Muscle weakness (generalized): Secondary | ICD-10-CM | POA: Diagnosis not present

## 2020-08-24 ENCOUNTER — Encounter: Payer: Self-pay | Admitting: Physical Therapy

## 2020-08-24 NOTE — Therapy (Signed)
[facility] [address] [place], [place], [address] Phone: [phone]   Fax:  [phone]  Physical Therapy Treatment  Patient Details  Name: Kirk [name] MRN: [id number] Date of Birth: 06-07-1941 Referring Provider (PT): zach Tamala Julian   Encounter Date: 08/21/2020   PT End of Session - 08/24/20 2114    Visit Number 5    Number of Visits 12    Date for PT Re-Evaluation 09/24/20    Authorization Type HTA    PT Start Time 1302    PT Stop Time 1345    PT Time Calculation (min) 43 min    Equipment Utilized During Treatment Gait belt    Activity Tolerance Patient tolerated treatment well    Behavior During Therapy [facility] for tasks assessed/performed           Past Medical History:  Diagnosis Date  . Cataract   . Congenital glaucoma    "both eyes operated on when I was 72 months old"  . Cough   . Disturbance of skin sensation   . Diverticulosis of colon 2014  . Diverticulum of esophagus, acquired   . Dysphagia, unspecified(787.20)   . Encounter for long-term (current) use of other medications   . Herpes zoster without mention of complication   . Hyperglycemia    patient denies  . Hyperlipidemia   . Hypertension   . 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 Lumbago   . Nevus, non-neoplastic   . Osteoarthrosis, unspecified whether generalized or localized, unspecified site    pt. denies  . Other premature beats   . Pain in limb   . Plantar fascial fibromatosis   . Special screening for malignant neoplasm of prostate   . Unspecified glaucoma(365.9)   . Unspecified pruritic disorder   . Unspecified vitamin D deficiency   . Urinary frequency   . Zenker's diverticulum     Past Surgical History:  Procedure Laterality Date  . CATARACT EXTRACTION BILATERAL W/ ANTERIOR VITRECTOMY Bilateral 1977  . COLONOSCOPY  09/06/2013   [name]  . DIRECT LARYNGOSCOPY  10/22/2015    Cervical esophagoscopy. Endoscopic esophageal diverticulotomy.   Marland Kitchen [place]  . GLAUCOMA SURGERY  1945   congenital glaucoma  . HERNIA REPAIR  1993  . [place]  2008  . LARYNGOSCOPY     with stapling of zenkers diverticulum  . LUMBAR LAMINECTOMY/ DECOMPRESSION WITH MET-RX Left 08/16/2019   Procedure: Left Lumbar Two-Three Minimally Invasive Laminectomy and Microdiscectomy;  Surgeon: Judith Part, MD;  Location: [facility];  Service: Neurosurgery;  Laterality: Left;  Left Lumbar 2-3 Minimally invasive laminectomy and microdiscectomy    There were no vitals filed for this visit.   Subjective Assessment - 08/24/20 2114    Subjective Pt with no new complaints.    Currently in Pain? Yes    Pain Score 2     Pain Location Leg    Pain Orientation Right;Left    Pain Descriptors / Indicators Numbness;Aching;Burning    Pain Type Chronic pain    Pain Onset More than a month ago    Pain Frequency Intermittent                             [facility] Adult PT Treatment/Exercise - 08/24/20 2108      Knee/Hip Exercises: Stretches   Active Hamstring Stretch 3 reps;30 seconds  Active Hamstring Stretch Limitations seated      Knee/Hip Exercises: Aerobic   Recumbent Bike L1 x 5 min; R foot/heel assist, R foot plantar flexion too weak to hold pressure on pedal.      Knee/Hip Exercises: Standing   Hip Flexion 20 reps;Knee bent    Forward Step Up 10 reps;Both;Step Height: 6"    Functional Squat 15 reps    Gait Training 35 ft with 4WW x 6, education on more upright posture     Other Standing Knee Exercises Standing without UE support, up to 30 sec x 8;  L/R and up/down head turns,  x 20 ea ;  Reaching outside of bos x 10 bil 1 UE support;       Knee/Hip Exercises: Seated   Long Arc Quad 20 reps;Both    Long Arc Quad Weight 3 lbs.    Other Seated Knee/Hip Exercises   seated PF with RTB x 30 ;    Sit to [place] 10 reps      Knee/Hip Exercises:  Supine   Bridges 15 reps    Straight Leg Raises Both;10 reps      Manual Therapy   Manual therapy comments Long leg distraction bil for lumbar pump x2 min ea;                   PT Education - 08/24/20 2114    Education Details HEp reviwed    Person(s) Educated Patient    Methods Explanation;Demonstration;Tactile cues;Verbal cues;Handout    Comprehension Verbalized understanding;Returned demonstration;Verbal cues required;Tactile cues required;Need further instruction            PT Short Term Goals - 07/31/20 0821      PT SHORT TERM GOAL #1   Title Pt to be independent with initial HEP    Time 2    Period Weeks    Status New    Target Date 08/13/20             PT Long Term Goals - 07/31/20 0821      PT LONG TERM GOAL #1   Title Pt to be indepedent with final HEP for back and LEs    Time 8    Period Weeks    Status New    Target Date 09/24/20      PT LONG TERM GOAL #2   Title Pt to demo improved strength of bil hips and knees, to at least 4+/5 to improve stability and gait.    Time 8    Period Weeks    Status New    Target Date 09/24/20      PT LONG TERM GOAL #3   Title Pt to demo independent/safe ability to put walker in car and safely get into car.    Time 8    Period Weeks    Status New    Target Date 09/24/20      PT LONG TERM GOAL #4   Title Pt to demo improved static balance without AD, to be rated at least (good), for improved ability for ADLS.    Time 8    Period Weeks    Status New    Target Date 09/24/20      PT LONG TERM GOAL #5   Title Pt to demo independent/safe navigation of at least 5 stairs, with hand rail, for improved safety at home and in community    Time 8    Period Weeks    Status New  Target Date 09/24/20                 Plan - 08/24/20 2116    Clinical Impression Statement Pt with much weakness in ankle PF that is effecting strength of R LE, causing leg to buckle after about 30 sec of standing. Req use of  RW due to safetey and weakness. Ther ex continued for strenthening.    Personal Factors and Comorbidities Comorbidity 1    Comorbidities stenosis    Examination-Activity Limitations Locomotion Level;Bend;Squat;Stairs;Stand    Examination-Participation Restrictions Cleaning;Community Activity;Shop;Yard Work    Merchant navy officer Evolving/Moderate complexity    Rehab Potential Good    PT Frequency 2x / week    PT Duration 8 weeks    PT Treatment/Interventions ADLs/Self Care Home Management;Cryotherapy;Electrical Stimulation;Gait training;DME Instruction;Ultrasound;Traction;Moist Heat;Stair training;Functional mobility training;Iontophoresis 67m/ml Dexamethasone;Therapeutic activities;Therapeutic exercise;Balance training;Neuromuscular re-education;Manual techniques;Orthotic Fit/Training;Patient/family education;Passive range of motion;Wheelchair mobility training;Dry needling;Taping;Joint Manipulations;Spinal Manipulations;Visual/perceptual remediation/compensation    Consulted and Agree with Plan of Care Patient           Patient will benefit from skilled therapeutic intervention in order to improve the following deficits and impairments:  Abnormal gait, Decreased coordination, Difficulty walking, Decreased safety awareness, Decreased endurance, Decreased activity tolerance, Pain, Impaired vision/preception, Decreased balance, Decreased strength, Decreased mobility  Visit Diagnosis: Muscle weakness (generalized)  Other abnormalities of gait and mobility     Problem List Patient Active Problem List   Diagnosis Date Noted  . Actinic keratosis 06/16/2020  . Bilateral leg weakness 06/16/2020  . Onychomycosis 12/13/2019  . Herniated lumbar intervertebral disc 08/16/2019  . 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 Acute right ankle pain 07/19/2018  . TSH elevation 02/25/2016  . Orthostatic hypotension 02/17/2016  . Syncope 01/07/2016  . Esophageal diverticulum,  acquired 10/22/2015  . 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 Bifascicular block 08/20/2015  . Neuropathy ([place] 02/05/[place]  . Diverticulosis of colon   . Vitamin D deficiency   . Hyperglycemia   . Dysphagia   . Diverticulum of esophagus, acquired   . Hyperlipidemia   . Glaucoma   . BPH (benign prostatic hyperplasia)   . Essential hypertension 02/01/2013  . 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    LLyndee Hensen PT, DPT 9:18 PM  08/24/20    [place] [address]Phone: [phone]  Fax:  [phone] Name: JZACKARIA BURKEYMRN: [id number] of Birth: 3August 23, 1942

## 2020-08-24 NOTE — Therapy (Signed)
[facility] [address] [place], [place], [address] Phone: [phone]   Fax:  [phone]  Physical Therapy Treatment  Patient Details  Name: Kirk [name] MRN: [id number] Date of Birth: 02/02/1941 Referring Provider (PT): zach Tamala Julian   Encounter Date: 08/19/2020   PT End of Session - 08/24/20 2056    Visit Number 4    Number of Visits 12    Date for PT Re-Evaluation 09/24/20    Authorization Type HTA    PT Start Time 5885    PT Stop Time 1515    PT Time Calculation (min) 43 min    Equipment Utilized During Treatment Gait belt    Activity Tolerance Patient tolerated treatment well    Behavior During Therapy [facility] for tasks assessed/performed           Past Medical History:  Diagnosis Date  . Cataract   . Congenital glaucoma    "both eyes operated on when I was 21 months old"  . Cough   . Disturbance of skin sensation   . Diverticulosis of colon 2014  . Diverticulum of esophagus, acquired   . Dysphagia, unspecified(787.20)   . Encounter for long-term (current) use of other medications   . Herpes zoster without mention of complication   . Hyperglycemia    patient denies  . Hyperlipidemia   . Hypertension   . 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 Lumbago   . Nevus, non-neoplastic   . Osteoarthrosis, unspecified whether generalized or localized, unspecified site    pt. denies  . Other premature beats   . Pain in limb   . Plantar fascial fibromatosis   . Special screening for malignant neoplasm of prostate   . Unspecified glaucoma(365.9)   . Unspecified pruritic disorder   . Unspecified vitamin D deficiency   . Urinary frequency   . Zenker's diverticulum     Past Surgical History:  Procedure Laterality Date  . CATARACT EXTRACTION BILATERAL W/ ANTERIOR VITRECTOMY Bilateral 1977  . COLONOSCOPY  09/06/2013   [name]  . DIRECT LARYNGOSCOPY  10/22/2015    Cervical esophagoscopy. Endoscopic esophageal diverticulotomy.   Marland Kitchen [place]  . GLAUCOMA SURGERY  1945   congenital glaucoma  . HERNIA REPAIR  1993  . [place]  2008  . LARYNGOSCOPY     with stapling of zenkers diverticulum  . LUMBAR LAMINECTOMY/ DECOMPRESSION WITH MET-RX Left 08/16/2019   Procedure: Left Lumbar Two-Three Minimally Invasive Laminectomy and Microdiscectomy;  Surgeon: Judith Part, MD;  Location: [facility];  Service: Neurosurgery;  Laterality: Left;  Left Lumbar 2-3 Minimally invasive laminectomy and microdiscectomy    There were no vitals filed for this visit.   Subjective Assessment - 08/24/20 2048    Subjective Pt states continued weakness in leg and diffiuclty with standing. Continued burning in LEs, but weakness most bothersome.    Currently in Pain? Yes    Pain Score 2                              [facility] Adult PT Treatment/Exercise - 08/24/20 0001      Knee/Hip Exercises: Aerobic   Recumbent Bike L1 x 5 min; R foot/heel assist, R foot plantar flexion too weak to hold pressure on pedal.      Knee/Hip Exercises: Standing   Hip Flexion 20 reps;Knee bent    Hip  Abduction 10 reps;2 sets;Both    Functional Squat 15 reps    Gait Training 35 ft with 4WW x 6, education on more upright posture     Other Standing Knee Exercises Standing without UE support, up to 30 sec x 8;  L/R and up/down head turns,  x 20 ea       Knee/Hip Exercises: Seated   Long Arc Quad 20 reps;Both    Long Arc Quad Weight 3 lbs.    Other Seated Knee/Hip Exercises   seated PF with RTB x 30 ;    Sit to [employer] 10 reps      Knee/Hip Exercises: Supine   Short Arc Quad Sets 20 reps;Right    Bridges 15 reps    Straight Leg Raises Both;10 reps      Manual Therapy   Manual therapy comments Long leg distraction bil for lumbar pump x2 min ea;                     PT Short Term Goals - 07/31/20 0175      PT SHORT TERM GOAL #1    Title Pt to be independent with initial HEP    Time 2    Period Weeks    Status New    Target Date 08/13/20             PT Long Term Goals - 07/31/20 1025      PT LONG TERM GOAL #1   Title Pt to be indepedent with final HEP for back and LEs    Time 8    Period Weeks    Status New    Target Date 09/24/20      PT LONG TERM GOAL #2   Title Pt to demo improved strength of bil hips and knees, to at least 4+/5 to improve stability and gait.    Time 8    Period Weeks    Status New    Target Date 09/24/20      PT LONG TERM GOAL #3   Title Pt to demo independent/safe ability to put walker in car and safely get into car.    Time 8    Period Weeks    Status New    Target Date 09/24/20      PT LONG TERM GOAL #4   Title Pt to demo improved static balance without AD, to be rated at least (good), for improved ability for ADLS.    Time 8    Period Weeks    Status New    Target Date 09/24/20      PT LONG TERM GOAL #5   Title Pt to demo independent/safe navigation of at least 5 stairs, with hand rail, for improved safety at home and in community    Time 8    Period Weeks    Status New    Target Date 09/24/20                 Plan - 08/24/20 2058    Clinical Impression Statement Continued practice with static standing, stability, and gait. Pt with much dificulty due to LE weakness, req use of Walker for safety.    Personal Factors and Comorbidities Comorbidity 1    Comorbidities stenosis    Examination-Activity Limitations Locomotion Level;Bend;Squat;Stairs;Stand    Examination-Participation Restrictions Cleaning;Community Activity;Shop;Yard Work    Merchant navy officer Evolving/Moderate complexity    Rehab Potential Good    PT Frequency 2x / week  PT Duration 8 weeks    PT Treatment/Interventions ADLs/Self Care Home Management;Cryotherapy;Electrical Stimulation;Gait training;DME Instruction;Ultrasound;Traction;Moist Heat;Stair training;Functional  mobility training;Iontophoresis 71m/ml Dexamethasone;Therapeutic activities;Therapeutic exercise;Balance training;Neuromuscular re-education;Manual techniques;Orthotic Fit/Training;Patient/family education;Passive range of motion;Wheelchair mobility training;Dry needling;Taping;Joint Manipulations;Spinal Manipulations;Visual/perceptual remediation/compensation    Consulted and Agree with Plan of Care Patient           Patient will benefit from skilled therapeutic intervention in order to improve the following deficits and impairments:  Abnormal gait, Decreased coordination, Difficulty walking, Decreased safety awareness, Decreased endurance, Decreased activity tolerance, Pain, Impaired vision/preception, Decreased balance, Decreased strength, Decreased mobility  Visit Diagnosis: Muscle weakness (generalized)  Other abnormalities of gait and mobility     Problem List Patient Active Problem List   Diagnosis Date Noted  . Actinic keratosis 06/16/2020  . Bilateral leg weakness 06/16/2020  . Onychomycosis 12/13/2019  . Herniated lumbar intervertebral disc 08/16/2019  . 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 Acute right ankle pain 07/19/2018  . TSH elevation 02/25/2016  . Orthostatic hypotension 02/17/2016  . Syncope 01/07/2016  . 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 Bifascicular block 08/20/2015  . [place] ([place] 02/05/2015  . Diverticulosis of colon   . Vitamin D deficiency   . Hyperglycemia   . Dysphagia   . Diverticulum of esophagus, acquired   . Hyperlipidemia   . Glaucoma   . BPH (benign prostatic hyperplasia)   . Essential hypertension 02/01/2013  . 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    LLyndee Hensen PT, DPT 9:07 PM  08/24/20    [place] [address]Phone: [phone]  Fax:  [phone] Name: Kirk GRUNEWALDMRN: [id number] of Birth:  [birth year]

## 2020-08-25 ENCOUNTER — Other Ambulatory Visit: Payer: Self-pay

## 2020-08-25 ENCOUNTER — Encounter: Payer: Self-pay | Admitting: Physical Therapy

## 2020-08-25 ENCOUNTER — Ambulatory Visit: Payer: PPO | Admitting: Physical Therapy

## 2020-08-25 DIAGNOSIS — R2689 Other abnormalities of gait and mobility: Secondary | ICD-10-CM | POA: Diagnosis not present

## 2020-08-25 DIAGNOSIS — M6281 Muscle weakness (generalized): Secondary | ICD-10-CM

## 2020-08-25 NOTE — Therapy (Signed)
[facility] [address] [place], [place], [address] Phone: [phone]   Fax:  [phone]  Physical Therapy Treatment  Patient Details  Name: Kirk [name] MRN: [id number] Date of Birth: 1941-09-22 Referring Provider (PT): zach Tamala Julian   Encounter Date: 08/25/2020   PT End of Session - 08/25/20 2542    Visit Number 6    Number of Visits 12    Date for PT Re-Evaluation 09/24/20    Authorization Type HTA    PT Start Time 1435    PT Stop Time 1515    PT Time Calculation (min) 40 min    Equipment Utilized During Treatment Gait belt    Activity Tolerance Patient tolerated treatment well    Behavior During Therapy [facility] for tasks assessed/performed           Past Medical History:  Diagnosis Date  . Cataract   . Congenital glaucoma    "both eyes operated on when I was 60 months old"  . Cough   . Disturbance of skin sensation   . Diverticulosis of colon 2014  . Diverticulum of esophagus, acquired   . Dysphagia, unspecified(787.20)   . Encounter for long-term (current) use of other medications   . Herpes zoster without mention of complication   . Hyperglycemia    patient denies  . Hyperlipidemia   . Hypertension   . 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 Lumbago   . Nevus, non-neoplastic   . Osteoarthrosis, unspecified whether generalized or localized, unspecified site    pt. denies  . Other premature beats   . Pain in limb   . Plantar fascial fibromatosis   . Special screening for malignant neoplasm of prostate   . Unspecified glaucoma(365.9)   . Unspecified pruritic disorder   . Unspecified vitamin D deficiency   . Urinary frequency   . Zenker's diverticulum     Past Surgical History:  Procedure Laterality Date  . CATARACT EXTRACTION BILATERAL W/ ANTERIOR VITRECTOMY Bilateral 1977  . COLONOSCOPY  09/06/2013   [name]  . DIRECT LARYNGOSCOPY  10/22/2015    Cervical esophagoscopy. Endoscopic esophageal diverticulotomy.   Marland Kitchen [place]  . GLAUCOMA SURGERY  1945   congenital glaucoma  . HERNIA REPAIR  1993  . [place]  2008  . LARYNGOSCOPY     with stapling of zenkers diverticulum  . LUMBAR LAMINECTOMY/ DECOMPRESSION WITH MET-RX Left 08/16/2019   Procedure: Left Lumbar Two-Three Minimally Invasive Laminectomy and Microdiscectomy;  Surgeon: Judith Part, MD;  Location: [facility];  Service: Neurosurgery;  Laterality: Left;  Left Lumbar 2-3 Minimally invasive laminectomy and microdiscectomy    There were no vitals filed for this visit.   Subjective Assessment - 08/25/20 1531    Subjective Pt states minimal pain in leg/back, but weakness continues to be bothersome.    Currently in Pain? No/denies    Pain Score 0-No pain                             [facility] Adult PT Treatment/Exercise - 08/25/20 0001      Knee/Hip Exercises: Stretches   Active Hamstring Stretch 3 reps;30 seconds    Active Hamstring Stretch Limitations seated    Other Knee/Hip Stretches seated lumbar flexion 30 sec x 3;       Knee/Hip Exercises: Aerobic   Recumbent Bike L1 x 5 min; R  foot/heel assist, R foot plantar flexion too weak to hold pressure on pedal.      Knee/Hip Exercises: Standing   Hip Flexion 20 reps;Knee bent    Forward Step Up --    Functional Squat 15 reps    Gait Training --    Other Standing Knee Exercises Standing without UE support, up to 30 sec x 8;  L/R and up/down head turns,  x 20 ea ;  post weight shifts x 20 ;       Knee/Hip Exercises: Seated   Long Arc Quad 20 reps;Both    Long Arc Quad Weight 3 lbs.    Other Seated Knee/Hip Exercises   seated PF with RTB x 30 ;    Other Seated Knee/Hip Exercises seated HR x 20     Sit to Sand 10 reps      Knee/Hip Exercises: Supine   Bridges --    Straight Leg Raises --      Manual Therapy   Manual therapy comments --                   PT Education - 08/25/20 1531    Education Details Reviewed HEP    Person(s) Educated Patient    Methods Explanation;Demonstration;Tactile cues;Verbal cues    Comprehension Verbalized understanding;Returned demonstration;Verbal cues required;Tactile cues required            PT Short Term Goals - 07/31/20 0821      PT SHORT TERM GOAL #1   Title Pt to be independent with initial HEP    Time 2    Period Weeks    Status New    Target Date 08/13/20             PT Long Term Goals - 07/31/20 0821      PT LONG TERM GOAL #1   Title Pt to be indepedent with final HEP for back and LEs    Time 8    Period Weeks    Status New    Target Date 09/24/20      PT LONG TERM GOAL #2   Title Pt to demo improved strength of bil hips and knees, to at least 4+/5 to improve stability and gait.    Time 8    Period Weeks    Status New    Target Date 09/24/20      PT LONG TERM GOAL #3   Title Pt to demo independent/safe ability to put walker in car and safely get into car.    Time 8    Period Weeks    Status New    Target Date 09/24/20      PT LONG TERM GOAL #4   Title Pt to demo improved static balance without AD, to be rated at least (good), for improved ability for ADLS.    Time 8    Period Weeks    Status New    Target Date 09/24/20      PT LONG TERM GOAL #5   Title Pt to demo independent/safe navigation of at least 5 stairs, with hand rail, for improved safety at home and in community    Time 8    Period Weeks    Status New    Target Date 09/24/20                 Plan - 08/25/20 1532    Clinical Impression Statement Pt most limited with static and dynamic balance with standing due  to R LE weakness. Pt very hesitant for practicing NMR without UE support due to weakness in LE, and req CGA. Minimal strength improvements seen thus far for plantar flexion.    Personal Factors and Comorbidities Comorbidity 1    Comorbidities stenosis    Examination-Activity Limitations  Locomotion Level;Bend;Squat;Stairs;Stand    Examination-Participation Restrictions Cleaning;Community Activity;Shop;Yard Work    [employer]chant [employer] officer Evolving/Moderate complexity    Rehab Potential Good    PT Frequency 2x / week    PT Duration 8 weeks    PT Treatment/Interventions ADLs/Self Care Home Management;Cryotherapy;Electrical Stimulation;Gait training;DME Instruction;Ultrasound;Traction;Moist Heat;Stair training;Functional mobility training;Iontophoresis 72m/ml Dexamethasone;Therapeutic activities;Therapeutic exercise;Balance training;Neuromuscular re-education;Manual techniques;Orthotic Fit/Training;Patient/family education;Passive range of motion;Wheelchair mobility training;Dry needling;Taping;Joint Manipulations;Spinal Manipulations;Visual/perceptual remediation/compensation    Consulted and Agree with Plan of Care Patient           Patient will benefit from skilled therapeutic intervention in order to improve the following deficits and impairments:  Abnormal gait, Decreased coordination, Difficulty walking, Decreased safety awareness, Decreased endurance, Decreased activity tolerance, Pain, Impaired vision/preception, Decreased balance, Decreased strength, Decreased mobility  Visit Diagnosis: Muscle weakness (generalized)  Other abnormalities of gait and mobility     Problem List Patient Active Problem List   Diagnosis Date Noted  . Actinic keratosis 06/16/2020  . Bilateral leg weakness 06/16/2020  . Onychomycosis 12/13/2019  . Herniated lumbar intervertebral disc 08/16/2019  . 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 Acute right ankle pain 07/19/2018  . TSH elevation 02/25/2016  . Orthostatic hypotension 02/17/2016  . Syncope 01/07/2016  . 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 Bifascicular block 08/20/2015  . Neuropathy ([place] 02/05/2015  . Diverticulosis of colon   . Vitamin D deficiency   . Hyperglycemia    . Dysphagia   . Diverticulum of esophagus, acquired   . Hyperlipidemia   . Glaucoma   . BPH (benign prostatic hyperplasia)   . Essential hypertension 02/01/2013  . 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   LLyndee Hensen PT, DPT 3:34 PM  08/25/20    [place] [address]Phone: [phone]  Fax:  [phone] Name: JMADDAX PALINKASMRN: [id number] of Birth: 3March 29, 1942

## 2020-08-26 ENCOUNTER — Other Ambulatory Visit: Payer: Self-pay | Admitting: Family Medicine

## 2020-08-27 ENCOUNTER — Ambulatory Visit: Payer: PPO | Admitting: Physical Therapy

## 2020-08-27 ENCOUNTER — Other Ambulatory Visit: Payer: Self-pay

## 2020-08-27 ENCOUNTER — Encounter: Payer: Self-pay | Admitting: Physical Therapy

## 2020-08-27 DIAGNOSIS — R2689 Other abnormalities of gait and mobility: Secondary | ICD-10-CM

## 2020-08-27 DIAGNOSIS — M6281 Muscle weakness (generalized): Secondary | ICD-10-CM

## 2020-08-27 NOTE — Patient Instructions (Signed)
Access Code: [email or web address] URL: [email or web address] Date: 08/27/2020 Prepared by: Lyndee Hensen  Exercises Seated Hamstring Stretch - 2 x daily - 3 reps - 30 hold Single Knee to Chest Stretch - 2 x daily - 3 reps - 30 hold Supine March - 2 x daily - 10 reps - 2 sets - 3 hold Standing Hip Abduction - 1 x daily - 10 reps - 1-2 sets Seated Knee Extension AROM - 1 x daily - 10 reps - 2 sets Straight Leg Raise - 1 x daily - 10 reps - 1 sets Sidelying Hip Abduction - 1 x daily - 10 reps - 2 sets Sit to Stand - 1 x daily - 1 sets - 10 reps Seated Heel Raise - 2 x daily - 2 sets - 10 reps Seated Eccentric Ankle Plantar Flexion with Resistance - Straight Leg - 2 x daily - 1-2 sets - 10 reps

## 2020-08-28 ENCOUNTER — Other Ambulatory Visit: Payer: Self-pay | Admitting: Family Medicine

## 2020-08-29 ENCOUNTER — Encounter: Payer: Self-pay | Admitting: Physical Therapy

## 2020-08-29 NOTE — Therapy (Signed)
[facility] [address] [place], [place], [address] Phone: [phone]   Fax:  [phone]  Physical Therapy Treatment  Patient Details  Name: Kirk [name] MRN: [id number] Date of Birth: Feb 11, 1941 Referring Provider (PT): zach Tamala Julian   Encounter Date: 08/27/2020   PT End of Session - 08/29/20 3762    Visit Number 7    Number of Visits 12    Date for PT Re-Evaluation 09/24/20    Authorization Type HTA    PT Start Time 1435    PT Stop Time 1515    PT Time Calculation (min) 40 min    Equipment Utilized During Treatment Gait belt    Activity Tolerance Patient tolerated treatment well    Behavior During Therapy [facility] for tasks assessed/performed           Past Medical History:  Diagnosis Date  . Cataract   . Congenital glaucoma    "both eyes operated on when I was 98 months old"  . Cough   . Disturbance of skin sensation   . Diverticulosis of colon 2014  . Diverticulum of esophagus, acquired   . Dysphagia, unspecified(787.20)   . Encounter for long-term (current) use of other medications   . Herpes zoster without mention of complication   . Hyperglycemia    patient denies  . Hyperlipidemia   . Hypertension   . 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 Lumbago   . Nevus, non-neoplastic   . Osteoarthrosis, unspecified whether generalized or localized, unspecified site    pt. denies  . Other premature beats   . Pain in limb   . Plantar fascial fibromatosis   . Special screening for malignant neoplasm of prostate   . Unspecified glaucoma(365.9)   . Unspecified pruritic disorder   . Unspecified vitamin D deficiency   . Urinary frequency   . Zenker's diverticulum     Past Surgical History:  Procedure Laterality Date  . CATARACT EXTRACTION BILATERAL W/ ANTERIOR VITRECTOMY Bilateral 1977  . COLONOSCOPY  09/06/2013   [name]  . DIRECT LARYNGOSCOPY  10/22/2015    Cervical esophagoscopy. Endoscopic esophageal diverticulotomy.   Marland Kitchen [place]  . GLAUCOMA SURGERY  1945   congenital glaucoma  . HERNIA REPAIR  1993  . [place]  2008  . LARYNGOSCOPY     with stapling of zenkers diverticulum  . LUMBAR LAMINECTOMY/ DECOMPRESSION WITH MET-RX Left 08/16/2019   Procedure: Left Lumbar Two-Three Minimally Invasive Laminectomy and Microdiscectomy;  Surgeon: Judith Part, MD;  Location: [facility];  Service: Neurosurgery;  Laterality: Left;  Left Lumbar 2-3 Minimally invasive laminectomy and microdiscectomy    There were no vitals filed for this visit.   Subjective Assessment - 08/29/20 1431    Subjective Pt with no new complaints.    Currently in Pain? No/denies    Pain Score 0-No pain                             [facility] Adult PT Treatment/Exercise - 08/29/20 0001      Knee/Hip Exercises: Stretches   Active Hamstring Stretch 3 reps;30 seconds    Active Hamstring Stretch Limitations seated    Other Knee/Hip Stretches SKTC 30 sec x 3;       Knee/Hip Exercises: Standing   Hip Flexion 20 reps    Functional Squat 15 reps    Other Standing  Knee Exercises Standing without UE support, 1.5 min;  L/R wieght shifts  x 20 ea ;       Knee/Hip Exercises: Seated   Long Arc Quad 20 reps;Both    Long Arc Quad Weight 3 lbs.    Other Seated Knee/Hip Exercises   seated PF with RTB x 30 ;    Other Seated Knee/Hip Exercises seated HR x 20     Sit to Sand 10 reps      Knee/Hip Exercises: Supine   Quad Sets 20 reps    Bridges 15 reps    Straight Leg Raises Both;10 reps;2 sets      Knee/Hip Exercises: Sidelying   Hip ABduction 10 reps;Both      Manual Therapy   Manual Therapy Manual Traction    Manual Traction long leg distraction bil LE for lumbar pump x 2 min ea;                   PT Education - 08/29/20 1431    Education Details Reviewed HEP , new handout given    Person(s) Educated Patient     Methods Explanation;Demonstration;Verbal cues;Handout    Comprehension Verbalized understanding;Verbal cues required;Returned demonstration;Need further instruction            PT Short Term Goals - 07/31/20 0821      PT SHORT TERM GOAL #1   Title Pt to be independent with initial HEP    Time 2    Period Weeks    Status New    Target Date 08/13/20             PT Long Term Goals - 07/31/20 0821      PT LONG TERM GOAL #1   Title Pt to be indepedent with final HEP for back and LEs    Time 8    Period Weeks    Status New    Target Date 09/24/20      PT LONG TERM GOAL #2   Title Pt to demo improved strength of bil hips and knees, to at least 4+/5 to improve stability and gait.    Time 8    Period Weeks    Status New    Target Date 09/24/20      PT LONG TERM GOAL #3   Title Pt to demo independent/safe ability to put walker in car and safely get into car.    Time 8    Period Weeks    Status New    Target Date 09/24/20      PT LONG TERM GOAL #4   Title Pt to demo improved static balance without AD, to be rated at least (good), for improved ability for ADLS.    Time 8    Period Weeks    Status New    Target Date 09/24/20      PT LONG TERM GOAL #5   Title Pt to demo independent/safe navigation of at least 5 stairs, with hand rail, for improved safety at home and in community    Time 8    Period Weeks    Status New    Target Date 09/24/20                 Plan - 08/29/20 1432    Clinical Impression Statement Pt with slight improvment of duration for static, unsupported standing, without R LE buckling. continues to have weakness that is effecting safety and mobility    Personal Factors and Comorbidities Comorbidity  1    Comorbidities stenosis    Examination-Activity Limitations Locomotion Level;Bend;Squat;Stairs;Stand    Examination-Participation Restrictions Cleaning;Community Activity;Shop;Yard Work    [employer]chant [employer] officer Evolving/Moderate  complexity    Rehab Potential Good    PT Frequency 2x / week    PT Duration 8 weeks    PT Treatment/Interventions ADLs/Self Care Home Management;Cryotherapy;Electrical Stimulation;Gait training;DME Instruction;Ultrasound;Traction;Moist Heat;Stair training;Functional mobility training;Iontophoresis 48m/ml Dexamethasone;Therapeutic activities;Therapeutic exercise;Balance training;Neuromuscular re-education;Manual techniques;Orthotic Fit/Training;Patient/family education;Passive range of motion;Wheelchair mobility training;Dry needling;Taping;Joint Manipulations;Spinal Manipulations;Visual/perceptual remediation/compensation    Consulted and Agree with Plan of Care Patient           Patient will benefit from skilled therapeutic intervention in order to improve the following deficits and impairments:  Abnormal gait, Decreased coordination, Difficulty walking, Decreased safety awareness, Decreased endurance, Decreased activity tolerance, Pain, Impaired vision/preception, Decreased balance, Decreased strength, Decreased mobility  Visit Diagnosis: Muscle weakness (generalized)  Other abnormalities of gait and mobility     Problem List Patient Active Problem List   Diagnosis Date Noted  . Actinic keratosis 06/16/2020  . Bilateral leg weakness 06/16/2020  . Onychomycosis 12/13/2019  . Herniated lumbar intervertebral disc 08/16/2019  . 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 Acute right ankle pain 07/19/2018  . TSH elevation 02/25/2016  . Orthostatic hypotension 02/17/2016  . Syncope 01/07/2016  . 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 Bifascicular block 08/20/2015  . [place]uropathy ([place] 02/05/2015  . Diverticulosis of colon   . Vitamin D deficiency   . Hyperglycemia   . Dysphagia   . Diverticulum of esophagus, acquired   . Hyperlipidemia   . Glaucoma   . BPH (benign prostatic hyperplasia)   . Essential hypertension 02/01/2013  . Impotence  sexual 01/30/2013    LLyndee Hensen PT, DPT 2:33 PM  08/29/20    [place] [address]Phone: [phone]  Fax:  [phone] Name: Kirk MEINERMRN: [id number] of Birth: 306-05-1941

## 2020-09-02 ENCOUNTER — Other Ambulatory Visit: Payer: Self-pay

## 2020-09-02 ENCOUNTER — Encounter: Payer: Self-pay | Admitting: Physical Therapy

## 2020-09-02 ENCOUNTER — Ambulatory Visit: Payer: PPO | Admitting: Physical Therapy

## 2020-09-02 DIAGNOSIS — M6281 Muscle weakness (generalized): Secondary | ICD-10-CM

## 2020-09-02 DIAGNOSIS — R2689 Other abnormalities of gait and mobility: Secondary | ICD-10-CM

## 2020-09-02 NOTE — Therapy (Signed)
[facility] [address] [place], [place], [address] Phone: [phone]   Fax:  [phone]  Physical Therapy Treatment  Patient Details  Name: Kirk [name] MRN: [id number] Date of Birth: [birth year] Referring Provider (PT): zach Tamala Julian   Encounter Date: 09/02/2020   PT End of Session - 09/02/20 1533    Visit Number 8    Number of Visits 12    Date for PT Re-Evaluation 09/24/20    Authorization Type HTA    PT Start Time 1432    PT Stop Time 1517    PT Time Calculation (min) 45 min    Equipment Utilized During Treatment Gait belt    Activity Tolerance Patient tolerated treatment well    Behavior During Therapy [facility] for tasks assessed/performed           Past Medical History:  Diagnosis Date  . Cataract   . Congenital glaucoma    "both eyes operated on when I was 77 months old"  . Cough   . Disturbance of skin sensation   . Diverticulosis of colon 2014  . Diverticulum of esophagus, acquired   . Dysphagia, unspecified(787.20)   . Encounter for long-term (current) use of other medications   . Herpes zoster without mention of complication   . Hyperglycemia    patient denies  . Hyperlipidemia   . Hypertension   . 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 Lumbago   . Nevus, non-neoplastic   . Osteoarthrosis, unspecified whether generalized or localized, unspecified site    pt. denies  . Other premature beats   . Pain in limb   . Plantar fascial fibromatosis   . Special screening for malignant neoplasm of prostate   . Unspecified glaucoma(365.9)   . Unspecified pruritic disorder   . Unspecified vitamin D deficiency   . Urinary frequency   . Zenker's diverticulum     Past Surgical History:  Procedure Laterality Date  . CATARACT EXTRACTION BILATERAL W/ ANTERIOR VITRECTOMY Bilateral 1977  . COLONOSCOPY  09/06/2013   [name]  . DIRECT LARYNGOSCOPY  10/22/2015    Cervical esophagoscopy. Endoscopic esophageal diverticulotomy.   Marland Kitchen Moore  . GLAUCOMA SURGERY  1945   congenital glaucoma  . HERNIA REPAIR  1993  . [place]  2008  . LARYNGOSCOPY     with stapling of zenkers diverticulum  . LUMBAR LAMINECTOMY/ DECOMPRESSION WITH MET-RX Left 08/16/2019   Procedure: Left Lumbar Two-Three Minimally Invasive Laminectomy and Microdiscectomy;  Surgeon: Judith Part, MD;  Location: [place];  Service: Neurosurgery;  Laterality: Left;  Left Lumbar 2-3 Minimally invasive laminectomy and microdiscectomy    There were no vitals filed for this visit.   Subjective Assessment - 09/02/20 1532    Subjective Pt states doing ok at home, continues to rely on RW at all times.    Currently in Pain? No/denies    Pain Score 0-No pain                             [facility] Adult PT Treatment/Exercise - 09/02/20 0001      Knee/Hip Exercises: Stretches   Active Hamstring Stretch 3 reps;30 seconds    Active Hamstring Stretch Limitations seated      Knee/Hip Exercises: Standing   Hip Flexion 20 reps    Hip Abduction 10 reps;2 sets;Both    Functional Squat 15 reps  Stairs up/down 5 steps x 6 with 1 HR. education for recipricol on the way up and step to on way down for safety     Other Standing Knee Exercises side stepping at counter 10 ft x 6;    Other Standing Knee Exercises Standing without UE support, 1.5 min;  L/R wieght shifts  x 20 ea ;  Reaching outside of BOS x 15 bil;       Knee/Hip Exercises: Seated   Other Seated Knee/Hip Exercises   seated PF with RTB x 30 ;    Hamstring Curl 20 reps    Hamstring Limitations GTB    Sit to Sand 10 reps      Manual Therapy   Manual Therapy Manual Traction                    PT Short Term Goals - 07/31/20 0821      PT SHORT TERM GOAL #1   Title Pt to be independent with initial HEP    Time 2    Period Weeks    Status New    Target Date 08/13/20              PT Long Term Goals - 07/31/20 1610      PT LONG TERM GOAL #1   Title Pt to be indepedent with final HEP for back and LEs    Time 8    Period Weeks    Status New    Target Date 09/24/20      PT LONG TERM GOAL #2   Title Pt to demo improved strength of bil hips and knees, to at least 4+/5 to improve stability and gait.    Time 8    Period Weeks    Status New    Target Date 09/24/20      PT LONG TERM GOAL #3   Title Pt to demo independent/safe ability to put walker in car and safely get into car.    Time 8    Period Weeks    Status New    Target Date 09/24/20      PT LONG TERM GOAL #4   Title Pt to demo improved static balance without AD, to be rated at least (good), for improved ability for ADLS.    Time 8    Period Weeks    Status New    Target Date 09/24/20      PT LONG TERM GOAL #5   Title Pt to demo independent/safe navigation of at least 5 stairs, with hand rail, for improved safety at home and in community    Time 8    Period Weeks    Status New    Target Date 09/24/20                 Plan - 09/02/20 1534    Clinical Impression Statement Pt req cuing for posture with all ther ex, tendency for fwd flexion. Pt req much cuing on stairs for safety, foot placement and sequencing. Recommended recipricol gait on way up and step to on way down, leading with R LE due to weakness. Pt to benefit from continued strengthening and balance, gait, for safety    Personal Factors and Comorbidities Comorbidity 1    Comorbidities stenosis    Examination-Activity Limitations Locomotion Level;Bend;Squat;Stairs;Stand    Examination-Participation Restrictions Cleaning;Community Activity;Shop;Yard Work    Stability/Clinical Decision Making Evolving/Moderate complexity    Rehab Potential Good    PT Frequency 2x /  week    PT Duration 8 weeks    PT Treatment/Interventions ADLs/Self Care Home Management;Cryotherapy;Electrical Stimulation;Gait training;DME  Instruction;Ultrasound;Traction;Moist Heat;Stair training;Functional mobility training;Iontophoresis 36m/ml Dexamethasone;Therapeutic activities;Therapeutic exercise;Balance training;Neuromuscular re-education;Manual techniques;Orthotic Fit/Training;Patient/family education;Passive range of motion;Wheelchair mobility training;Dry needling;Taping;Joint Manipulations;Spinal Manipulations;Visual/perceptual remediation/compensation    Consulted and Agree with Plan of Care Patient           Patient will benefit from skilled therapeutic intervention in order to improve the following deficits and impairments:  Abnormal gait, Decreased coordination, Difficulty walking, Decreased safety awareness, Decreased endurance, Decreased activity tolerance, Pain, Impaired vision/preception, Decreased balance, Decreased strength, Decreased mobility  Visit Diagnosis: Muscle weakness (generalized)  Other abnormalities of gait and mobility     Problem List Patient Active Problem List   Diagnosis Date Noted  . Actinic keratosis 06/16/2020  . Bilateral leg weakness 06/16/2020  . Onychomycosis 12/13/2019  . Herniated lumbar intervertebral disc 08/16/2019  . 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 Acute right ankle pain 07/19/2018  . TSH elevation 02/25/2016  . Orthostatic hypotension 02/17/2016  . Syncope 01/07/2016  . 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 Bifascicular block 08/20/2015  . Neuropathy ([place] 02/05/2015  . Diverticulosis of colon   . Vitamin D deficiency   . Hyperglycemia   . Dysphagia   . Diverticulum of esophagus, acquired   . Hyperlipidemia   . Glaucoma   . BPH (benign prostatic hyperplasia)   . Essential hypertension 02/01/2013  . 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    LLyndee Hensen PT, DPT 3:36 PM  09/02/20    [facility] [place] [address]Phone: [phone]  Fax:   [phone] Name: JASANTE BLANDAMRN: [id number] of Birth: 311-Jun-1942

## 2020-09-04 ENCOUNTER — Ambulatory Visit (INDEPENDENT_AMBULATORY_CARE_PROVIDER_SITE_OTHER): Payer: PPO | Admitting: Physical Therapy

## 2020-09-04 ENCOUNTER — Encounter: Payer: Self-pay | Admitting: Physical Therapy

## 2020-09-04 ENCOUNTER — Other Ambulatory Visit: Payer: Self-pay

## 2020-09-04 DIAGNOSIS — R2689 Other abnormalities of gait and mobility: Secondary | ICD-10-CM

## 2020-09-04 DIAGNOSIS — M6281 Muscle weakness (generalized): Secondary | ICD-10-CM

## 2020-09-04 NOTE — Therapy (Signed)
[facility] [address] [place], [place], [address] Phone: [phone]   Fax:  [phone]  Physical Therapy Treatment  Patient Details  Name: Kirk [name] MRN: [id number] Date of Birth: May 29, 1941 Referring Provider (PT): zach Tamala Julian   Encounter Date: 09/04/2020   PT End of Session - 09/04/20 2876    Visit Number 9    Number of Visits 12    Date for PT Re-Evaluation 09/24/20    Authorization Type HTA    PT Start Time 1432    PT Stop Time 1513    PT Time Calculation (min) 41 min    Equipment Utilized During Treatment Gait belt    Activity Tolerance Patient tolerated treatment well    Behavior During Therapy WFL for tasks assessed/performed           Past Medical History:  Diagnosis Date   Cataract    Congenital glaucoma    "both eyes operated on when I was 48 months old"   Cough    Disturbance of skin sensation    Diverticulosis of colon 2014   Diverticulum of esophagus, acquired    Dysphagia, unspecified(787.20)    Encounter for long-term (current) use of other medications    Herpes zoster without mention of complication    Hyperglycemia    patient denies   Hyperlipidemia    Hypertension    Hypertrophy of prostate without urinary obstruction and other lower urinary tract symptoms (LUTS)    Lumbago    Nevus, non-neoplastic    Osteoarthrosis, unspecified whether generalized or localized, unspecified site    pt. denies   Other premature beats    Pain in limb    Plantar fascial fibromatosis    Special screening for malignant neoplasm of prostate    Unspecified glaucoma(365.9)    Unspecified pruritic disorder    Unspecified vitamin D deficiency    Urinary frequency    Zenker's diverticulum     Past Surgical History:  Procedure Laterality Date   CATARACT EXTRACTION BILATERAL W/ ANTERIOR VITRECTOMY Bilateral 1977   COLONOSCOPY  09/06/2013   [name]   DIRECT LARYNGOSCOPY  10/22/2015    Cervical esophagoscopy. Endoscopic esophageal diverticulotomy.    [place]   congenital glaucoma   HERNIA REPAIR  1993   INCISIONAL HERNIA REPAIR  2008   LARYNGOSCOPY     with stapling of zenkers diverticulum   LUMBAR LAMINECTOMY/ DECOMPRESSION WITH MET-RX Left 08/16/2019   Procedure: Left Lumbar Two-Three Minimally Invasive Laminectomy and Microdiscectomy;  Surgeon: Judith Part, MD;  Location: [place];  Service: Neurosurgery;  Laterality: Left;  Left Lumbar 2-3 Minimally invasive laminectomy and microdiscectomy    There were no vitals filed for this visit.   Subjective Assessment - 09/04/20 1441    Subjective Pt states "legs moving slow today" feels pain in bil posterior thighs.    Currently in Pain? Yes    Pain Score 3     Pain Location Leg    Pain Orientation Right;Left    Pain Descriptors / Indicators Burning;Numbness    Pain Type Chronic pain    Pain Onset More than a month ago    Pain Frequency Intermittent                             [facility] Adult PT Treatment/Exercise - 09/04/20 1450      Knee/Hip Exercises: Stretches   Active Hamstring  Stretch 3 reps;30 seconds    Active Hamstring Stretch Limitations seated    Other Knee/Hip Stretches --    Other Knee/Hip Stretches --      Knee/Hip Exercises: Standing   Hip Flexion 20 reps    Hip Abduction 10 reps;2 sets;Both    Functional Squat 15 reps    Other Standing Knee Exercises Staggered stance/ balance 30 sec x 2 bil;     Other Standing Knee Exercises Standing without UE support, 2 min;  L/R wieght shifts  x 20 ea ;  DLS, head nod and turns, body turns x 10 each bil;       Knee/Hip Exercises: Seated   Long Arc Quad 20 reps;Both    Long Arc Quad Weight 3 lbs.    Other Seated Knee/Hip Exercises   seated PF with RTB x 30 ;    Hamstring Curl 20 reps    Hamstring Limitations GTB    Sit to Sand 10 reps      Manual Therapy   Manual Therapy Manual  Traction                    PT Short Term Goals - 07/31/20 0821      PT SHORT TERM GOAL #1   Title Pt to be independent with initial HEP    Time 2    Period Weeks    Status New    Target Date 08/13/20             PT Long Term Goals - 07/31/20 8657      PT LONG TERM GOAL #1   Title Pt to be indepedent with final HEP for back and LEs    Time 8    Period Weeks    Status New    Target Date 09/24/20      PT LONG TERM GOAL #2   Title Pt to demo improved strength of bil hips and knees, to at least 4+/5 to improve stability and gait.    Time 8    Period Weeks    Status New    Target Date 09/24/20      PT LONG TERM GOAL #3   Title Pt to demo independent/safe ability to put walker in car and safely get into car.    Time 8    Period Weeks    Status New    Target Date 09/24/20      PT LONG TERM GOAL #4   Title Pt to demo improved static balance without AD, to be rated at least (good), for improved ability for ADLS.    Time 8    Period Weeks    Status New    Target Date 09/24/20      PT LONG TERM GOAL #5   Title Pt to demo independent/safe navigation of at least 5 stairs, with hand rail, for improved safety at home and in community    Time 8    Period Weeks    Status New    Target Date 09/24/20                 Plan - 09/04/20 1605    Clinical Impression Statement Pt with ability for standing for 2 min, unsupported, but reaches for UE support 4 times during this for balance. Pt very challenged with standing balance due to weakness in legs and feeling unstable.    Personal Factors and Comorbidities Comorbidity 1    Comorbidities stenosis    Examination-Activity  Limitations Locomotion Level;Bend;Squat;Stairs;Stand    Examination-Participation Restrictions Cleaning;Community Activity;Shop;Yard Work    [employer] Evolving/Moderate complexity    Rehab Potential Good    PT Frequency 2x / week    PT Duration 8 weeks    PT  Treatment/Interventions ADLs/Self Care Home Management;Cryotherapy;Electrical Stimulation;Gait training;DME Instruction;Ultrasound;Traction;Moist Heat;Stair training;Functional mobility training;Iontophoresis 27m/ml Dexamethasone;Therapeutic activities;Therapeutic exercise;Balance training;Neuromuscular re-education;Manual techniques;Orthotic Fit/Training;Patient/family education;Passive range of motion;Wheelchair mobility training;Dry needling;Taping;Joint Manipulations;Spinal Manipulations;Visual/perceptual remediation/compensation    Consulted and Agree with Plan of Care Patient           Patient will benefit from skilled therapeutic intervention in order to improve the following deficits and impairments:  Abnormal gait, Decreased coordination, Difficulty walking, Decreased safety awareness, Decreased endurance, Decreased activity tolerance, Pain, Impaired vision/preception, Decreased balance, Decreased strength, Decreased mobility  Visit Diagnosis: Muscle weakness (generalized)  Other abnormalities of gait and mobility     Problem List Patient Active Problem List   Diagnosis Date Noted   Actinic keratosis 06/16/2020   Bilateral leg weakness 06/16/2020   Onychomycosis 12/13/2019   Herniated lumbar intervertebral disc 08/16/2019   Degenerative lumbar spinal stenosis 02/14/2019   Acute right ankle pain 07/19/2018   TSH elevation 02/25/2016   Orthostatic hypotension 02/17/2016   Syncope 01/07/2016   Esophageal diverticulum, acquired 10/22/2015   Xeroderma 08/20/2015   Bifascicular block 08/20/2015   Neuropathy ([place] 02/05/2015   Diverticulosis of colon    Vitamin D deficiency    Hyperglycemia    Dysphagia    Diverticulum of esophagus, acquired    Hyperlipidemia    Glaucoma    BPH (benign prostatic hyperplasia)    Essential hypertension 02/01/2013   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    LLyndee Hensen PT, DPT 4:06 PM  09/04/20    [facility] [address] [address]Phone: [phone]  Fax:  [phone] Name: Kirk VOILESMRN: [id number] of Birth: 3Jul 03, 1942

## 2020-09-05 ENCOUNTER — Ambulatory Visit: Payer: PPO | Admitting: Neurology

## 2020-09-05 DIAGNOSIS — M5417 Radiculopathy, lumbosacral region: Secondary | ICD-10-CM

## 2020-09-05 DIAGNOSIS — R202 Paresthesia of skin: Secondary | ICD-10-CM

## 2020-09-05 DIAGNOSIS — G629 Polyneuropathy, unspecified: Secondary | ICD-10-CM

## 2020-09-08 ENCOUNTER — Other Ambulatory Visit: Payer: Self-pay

## 2020-09-08 DIAGNOSIS — R202 Paresthesia of skin: Secondary | ICD-10-CM

## 2020-09-09 ENCOUNTER — Ambulatory Visit (INDEPENDENT_AMBULATORY_CARE_PROVIDER_SITE_OTHER): Payer: PPO | Admitting: Physical Therapy

## 2020-09-09 ENCOUNTER — Other Ambulatory Visit: Payer: Self-pay

## 2020-09-09 DIAGNOSIS — R2689 Other abnormalities of gait and mobility: Secondary | ICD-10-CM | POA: Diagnosis not present

## 2020-09-09 DIAGNOSIS — M6281 Muscle weakness (generalized): Secondary | ICD-10-CM

## 2020-09-09 NOTE — Procedures (Signed)
[facility]  [place] [place]  [place], [place] [address] Tel: [phone] Fax:  [phone] Test Date:  09/05/2020  Patient: Kirk [name] DOB: [birth year] Physician: Narda Amber, DO  Sex: Male Height: 6\' 1"  Ref Phys: Hulan Saas, DO  ID#: [id number]   Technician:    Patient Complaints: This is a 79 year old man with lumbar spinal stenosis s/p decompression referred for evaluation of bilateral leg pain.  NCV & EMG Findings: Extensive electrodiagnostic testing of the right lower extremity and additional studies of the left shows:  1. Bilateral sural and superficial peroneal sensory responses are absent 2. Bilateral peroneal motor responses at the extensor digitorum brevis are absent, and reduced at the tibialis anterior.  Bilateral tibial motor responses are absent 3. Bilateral tibial H reflex studies are absent 4. Chronic motor axonal loss changes are affecting all the tested muscles in the lower extremity involving the L2-S1 myotomes bilaterally, which is severe distally.  There is no evidence of accompanied active denervation.  Impression: 1. 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 2. Superimposed multilevel radiculopathies affecting L2-S1 nerve root/segments is also likely, correlate clinically.   ___________________________ Narda Amber, DO    Nerve Conduction Studies Anti Sensory Summary Table   Stim Site NR Peak (ms) Norm Peak (ms) P-T Amp (V) Norm P-T Amp  Left Sup Peroneal Anti Sensory (Ant Lat Mall)  32C  12 cm NR  <4.6  >3  Right Sup Peroneal Anti Sensory (Ant Lat Mall)  32C  12 cm NR  <4.6  >3  Left Sural Anti Sensory (Lat Mall)  32C  Calf NR  <4.6  >3  Right Sural Anti Sensory (Lat Mall)  32C  Calf NR  <4.6  >3   Motor Summary Table   Stim Site NR Onset (ms) Norm Onset (ms) O-P Amp (mV) Norm O-P Amp Site1 Site2 Delta-0 (ms) Dist  (cm) Vel (m/s) Norm Vel (m/s)  Left Peroneal Motor (Ext Dig Brev)  32C  Ankle NR  <6.0  >2.5 B Fib Ankle  0.0  >40  B Fib NR     Poplt B Fib  0.0  >40  Poplt NR            Right Peroneal Motor (Ext Dig Brev)  32C  Ankle NR  <6.0  >2.5 B Fib Ankle  0.0  >40  B Fib NR     Poplt B Fib  0.0  >40  Poplt NR            Left Peroneal TA Motor (Tib Ant)  32C  Fib Head    4.2 <4.5 2.1 >3 Poplit Fib Head 1.7 8.0 47 >40  Poplit    5.9  2.0         Right Peroneal TA Motor (Tib Ant)  32C  Fib Head    3.2 <4.5 2.1 >3 Poplit Fib Head 2.0 8.0 40 >40  Poplit    5.2  1.9         Left Tibial Motor (Abd Hall Brev)  32C  Ankle NR  <6.0  >4 Knee Ankle  0.0  >40  Knee NR            Right Tibial Motor (Abd Hall Brev)  32C  Ankle NR  <6.0  >4 Knee Ankle  0.0  >40  Knee NR             H Reflex Studies  NR H-Lat (ms) Lat Norm (ms) L-R H-Lat (ms)  Left Tibial (Gastroc)  32C  NR  <35   Right Tibial (Gastroc)  32C  NR  <35    EMG   Side Muscle Ins Act Fibs Psw Fasc Number Recrt Dur Dur. Amp Amp. Poly Poly. Comment  Right AntTibialis Nml Nml Nml Nml 3- Rapid All 1+ All 2+ All 1+ N/A  Right Gastroc Nml Nml Nml Nml 2- Rapid Most 1+ Most 1+ Most 1+ N/A  Right Flex Dig Long Nml Nml Nml Nml SMU Rapid All 1+ All 1+ All 1+ N/A  Right RectFemoris Nml Nml Nml Nml 2- Rapid Many 1+ Many 1+ Many 1+ N/A  Right GluteusMed Nml Nml Nml Nml 2- Rapid Some 1+ Some 1+ Some 1+ N/A  Right AdductorLong Nml Nml Nml Nml 1- Rapid Many 1+ Some 1+ Some 1+ N/A  Right BicepsFemS Nml Nml Nml Nml 3- Rapid Most 1+ Most 1+ Many 1+ N/A  Left AntTibialis Nml Nml Nml Nml 3- Rapid All 1+ All 2+ All 1+ N/A  Left Gastroc Nml Nml Nml Nml 2- Rapid Most 1+ Most 1+ Most 1+ N/A  Left Flex Dig Long Nml Nml Nml Nml SMU Rapid All 1+ All 1+ All 1+ N/A  Left RectFemoris Nml Nml Nml Nml 2- Rapid Many 1+ Many 1+ Many 1+ N/A  Left GluteusMed Nml Nml Nml Nml 2- Rapid Some 1+ Some 1+ Some 1+ N/A  Left AdductorLong Nml Nml Nml Nml 1- Rapid Many 1+  Some 1+ Some 1+ N/A  Left BicepsFemS Nml Nml Nml Nml 3- Rapid Most 1+ Most 1+ Many 1+ N/A      Waveforms:

## 2020-09-10 ENCOUNTER — Encounter: Payer: Self-pay | Admitting: Physical Therapy

## 2020-09-10 NOTE — Therapy (Signed)
[facility] [address] [place], [place], [address] Phone: [phone]   Fax:  [phone]  Physical Therapy Treatment  Patient Details  Name: Kirk [name] MRN: [id number] Date of Birth: 1941/03/04 Referring Provider (PT): zach Tamala Julian   Encounter Date: 09/09/2020   PT End of Session - 09/10/20 2238    Visit Number 10    Number of Visits 12    Date for PT Re-Evaluation 09/24/20    Authorization Type HTA    PT Start Time 1435    PT Stop Time 1516    PT Time Calculation (min) 41 min    Equipment Utilized During Treatment Gait belt    Activity Tolerance Patient tolerated treatment well    Behavior During Therapy [facility] for tasks assessed/performed           Past Medical History:  Diagnosis Date  . Cataract   . Congenital glaucoma    "both eyes operated on when I was 69 months old"  . Cough   . Disturbance of skin sensation   . Diverticulosis of colon 2014  . Diverticulum of esophagus, acquired   . Dysphagia, unspecified(787.20)   . Encounter for long-term (current) use of other medications   . Herpes zoster without mention of complication   . Hyperglycemia    patient denies  . Hyperlipidemia   . Hypertension   . 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 Lumbago   . Nevus, non-neoplastic   . Osteoarthrosis, unspecified whether generalized or localized, unspecified site    pt. denies  . Other premature beats   . Pain in limb   . Plantar fascial fibromatosis   . Special screening for malignant neoplasm of prostate   . Unspecified glaucoma(365.9)   . Unspecified pruritic disorder   . Unspecified vitamin D deficiency   . Urinary frequency   . Zenker's diverticulum     Past Surgical History:  Procedure Laterality Date  . CATARACT EXTRACTION BILATERAL W/ ANTERIOR VITRECTOMY Bilateral 1977  . COLONOSCOPY  09/06/2013   [name]  . DIRECT LARYNGOSCOPY  10/22/2015    Cervical esophagoscopy. Endoscopic esophageal diverticulotomy.   Marland Kitchen [place]  . GLAUCOMA SURGERY  1945   congenital glaucoma  . HERNIA REPAIR  1993  . [place]  2008  . LARYNGOSCOPY     with stapling of zenkers diverticulum  . LUMBAR LAMINECTOMY/ DECOMPRESSION WITH MET-RX Left 08/16/2019   Procedure: Left Lumbar Two-Three Minimally Invasive Laminectomy and Microdiscectomy;  Surgeon: Judith Part, MD;  Location: [place];  Service: Neurosurgery;  Laterality: Left;  Left Lumbar 2-3 Minimally invasive laminectomy and microdiscectomy    There were no vitals filed for this visit.   Subjective Assessment - 09/10/20 2236    Subjective Pt states minimal change in LE strength. He did have nerve conduction test, has not had f/u with MD yet.    Currently in Pain? Yes    Pain Location Leg    Pain Orientation Right;Left    Pain Descriptors / Indicators Burning;Numbness    Pain Type Chronic pain    Pain Onset More than a month ago    Pain Frequency Intermittent                             [facility] Adult PT Treatment/Exercise - 09/10/20 0001      Ambulation/Gait   Gait Comments 35  ft x 8 with education on posture, and speed of direction changes.       Knee/Hip Exercises: Stretches   Active Hamstring Stretch 3 reps;30 seconds    Active Hamstring Stretch Limitations seated      Knee/Hip Exercises: Standing   Hip Flexion 20 reps    Functional Squat 15 reps    Stairs up/down 5 steps x 6 with 1 HR. education for recipricol on the way up and step to on way down for safety     Other Standing Knee Exercises Standing without UE support, 2 min;  L/R wieght shifts  x 20 ea ;  DLS, head nod and turns, body turns x 10 each bil;       Knee/Hip Exercises: Seated   Long Arc Quad 20 reps;Both    Long Arc Quad Weight 3 lbs.    Other Seated Knee/Hip Exercises   seated PF with RTB x 30 ;    Hamstring Curl 20 reps    Hamstring Limitations GTB    Sit  to Sand 10 reps      Knee/Hip Exercises: Supine   Bridges 15 reps    Straight Leg Raises Both;10 reps;2 sets      Manual Therapy   Manual Therapy Manual Traction                  PT Education - 09/10/20 2237    Education Details HEP updated    Person(s) Educated Patient    Methods Explanation;Demonstration;Tactile cues;Verbal cues;Handout    Comprehension Verbalized understanding;Returned demonstration;Verbal cues required;Tactile cues required;Need further instruction            PT Short Term Goals - 07/31/20 0821      PT SHORT TERM GOAL #1   Title Pt to be independent with initial HEP    Time 2    Period Weeks    Status New    Target Date 08/13/20             PT Long Term Goals - 07/31/20 0821      PT LONG TERM GOAL #1   Title Pt to be indepedent with final HEP for back and LEs    Time 8    Period Weeks    Status New    Target Date 09/24/20      PT LONG TERM GOAL #2   Title Pt to demo improved strength of bil hips and knees, to at least 4+/5 to improve stability and gait.    Time 8    Period Weeks    Status New    Target Date 09/24/20      PT LONG TERM GOAL #3   Title Pt to demo independent/safe ability to put walker in car and safely get into car.    Time 8    Period Weeks    Status New    Target Date 09/24/20      PT LONG TERM GOAL #4   Title Pt to demo improved static balance without AD, to be rated at least (good), for improved ability for ADLS.    Time 8    Period Weeks    Status New    Target Date 09/24/20      PT LONG TERM GOAL #5   Title Pt to demo independent/safe navigation of at least 5 stairs, with hand rail, for improved safety at home and in community    Time 8    Period Weeks    Status New  Target Date 09/24/20                 Plan - 09/10/20 2240    Clinical Impression Statement Pt with mild improvements in ability for unsupported standing, but max time is 1-1.5 min without req UE support. Pt is safe with  ambulation/mobility with RW, but is very reliant on it for fall risk. Pt continuing to benefit from safety awareness education, and strengthening.    Personal Factors and Comorbidities Comorbidity 1    Comorbidities stenosis    Examination-Activity Limitations Locomotion Level;Bend;Squat;Stairs;Stand    Examination-Participation Restrictions Cleaning;Community Activity;Shop;Yard Work    Merchant [employer] officer Evolving/Moderate complexity    Rehab Potential Good    PT Frequency 2x / week    PT Duration 8 weeks    PT Treatment/Interventions ADLs/Self Care Home Management;Cryotherapy;Electrical Stimulation;Gait training;DME Instruction;Ultrasound;Traction;Moist Heat;Stair training;Functional mobility training;Iontophoresis 68m/ml Dexamethasone;Therapeutic activities;Therapeutic exercise;Balance training;Neuromuscular re-education;Manual techniques;Orthotic Fit/Training;Patient/family education;Passive range of motion;Wheelchair mobility training;Dry needling;Taping;Joint Manipulations;Spinal Manipulations;Visual/perceptual remediation/compensation    Consulted and Agree with Plan of Care Patient           Patient will benefit from skilled therapeutic intervention in order to improve the following deficits and impairments:  Abnormal gait, Decreased coordination, Difficulty walking, Decreased safety awareness, Decreased endurance, Decreased activity tolerance, Pain, Impaired vision/preception, Decreased balance, Decreased strength, Decreased mobility  Visit Diagnosis: Muscle weakness (generalized)  Other abnormalities of gait and mobility     Problem List Patient Active Problem List   Diagnosis Date Noted  . Actinic keratosis 06/16/2020  . Bilateral leg weakness 06/16/2020  . Onychomycosis 12/13/2019  . Herniated lumbar intervertebral disc 08/16/2019  . 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 Acute right ankle pain 07/19/2018  . TSH elevation 02/25/2016  .  Orthostatic hypotension 02/17/2016  . Syncope 01/07/2016  . 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 Bifascicular block 08/20/2015  . Neuropathy ([place] 02/05/2015  . Diverticulosis of colon   . Vitamin D deficiency   . Hyperglycemia   . Dysphagia   . Diverticulum of esophagus, acquired   . Hyperlipidemia   . Glaucoma   . BPH (benign prostatic hyperplasia)   . Essential hypertension 02/01/2013  . 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    LLyndee Hensen PT, DPT 10:46 PM  09/10/20    [place] [address]Phone: [phone]  Fax:  [phone] Name: JRONDAL VANDEVELDEMRN: [id number] of Birth: 325-Nov-1942

## 2020-09-11 ENCOUNTER — Ambulatory Visit (INDEPENDENT_AMBULATORY_CARE_PROVIDER_SITE_OTHER): Payer: PPO | Admitting: Physical Therapy

## 2020-09-11 ENCOUNTER — Other Ambulatory Visit: Payer: Self-pay

## 2020-09-11 DIAGNOSIS — M6281 Muscle weakness (generalized): Secondary | ICD-10-CM | POA: Diagnosis not present

## 2020-09-11 DIAGNOSIS — R2689 Other abnormalities of gait and mobility: Secondary | ICD-10-CM

## 2020-09-14 ENCOUNTER — Encounter: Payer: Self-pay | Admitting: Physical Therapy

## 2020-09-14 NOTE — Progress Notes (Signed)
[facility] [place] Phone: [phone] Subjective:   Kirk [name], LAT, ATC acting as a scribe for Kirk Boxer, DO  I'm seeing this patient by the request  of:  [name], MD  CC: Leg pain and weakness, difficulty with balance   [id number]   07/08/2020 This is doing highly difficult for this patient.  Significant deconditioning noted.  Patient does have a mix of likely what is peripheral neuropathy as well as potentially some lumbar spinal stenosis still status post surgery.  I would like patient to repeat a nerve conduction study.  Has had about an outside facility but I would like to see if there is any other improvement.  Patient is taking different medications still and I would make no significant changes at this time.  May need to consider the possibility of increasing Cymbalta to 30 mg but patient has had difficulty with it previously.  Patient will then follow-up with me again 7 to 8 weeks for further evaluation.  Referred back to formal physical therapy for strength and conditioning.  Update 09/15/2020 Kirk [name] is a 79 y.o. male coming in with complaint of back pain. Pt reports back is feeling "no good," but does feel a little bit better. Pt has been doing PT and has need felt it helped him. Pt reports numbness/tingling mostly in right leg and foot. Pt reports coldness in bilat feet. Pt is interested to know result of testing.  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  Been in PT and at this point physical therapy was helpful but feels like at this point he does not know if he is making any more strides at this time.  On cymbalta 20 mg.  Patient though has discontinued this on his own.    Past Medical History:  Diagnosis Date  . Cataract   . Congenital glaucoma    "both eyes operated on when I was 42 months old"  . Cough   . Disturbance of skin  sensation   . Diverticulosis of colon 2014  . Diverticulum of esophagus, acquired   . Dysphagia, unspecified(787.20)   . Encounter for long-term (current) use of other medications   . Herpes zoster without mention of complication   . Hyperglycemia    patient denies  . Hyperlipidemia   . Hypertension   . 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 Lumbago   . Nevus, non-neoplastic   . Osteoarthrosis, unspecified whether generalized or localized, unspecified site    pt. denies  . Other premature beats   . Pain in limb   . Plantar fascial fibromatosis   . Special screening for malignant neoplasm of prostate   . Unspecified glaucoma(365.9)   . Unspecified pruritic disorder   . Unspecified vitamin D deficiency   . Urinary frequency   . Zenker's diverticulum    Past Surgical History:  Procedure Laterality Date  . CATARACT EXTRACTION BILATERAL W/ ANTERIOR VITRECTOMY Bilateral 1977  . COLONOSCOPY  09/06/2013   [name]  . DIRECT LARYNGOSCOPY  10/22/2015   Cervical esophagoscopy. Endoscopic esophageal diverticulotomy.   Marland Kitchen [place]  . GLAUCOMA SURGERY  1945   congenital glaucoma  . HERNIA REPAIR  1993  . [place]  2008  . LARYNGOSCOPY     with stapling of zenkers diverticulum  . LUMBAR LAMINECTOMY/ DECOMPRESSION WITH MET-RX  Left 08/16/2019   Procedure: Left Lumbar Two-Three Minimally Invasive Laminectomy and Microdiscectomy;  Surgeon: Judith Part, MD;  Location: [facility];  Service: Neurosurgery;  Laterality: Left;  Left Lumbar 2-3 Minimally invasive laminectomy and microdiscectomy   Social History   Socioeconomic History  . Marital status: Married    Spouse name: Not on file  . Number of children: Not on file  . Years of education: Not on file  . Highest education level: Not on file  Occupational History  . Not on file  Tobacco Use  . Smoking status: Never Smoker  . Smokeless  tobacco: Never Used  Vaping Use  . Vaping Use: Never used  Substance and Sexual Activity  . Alcohol use: No  . Drug use: No  . Sexual activity: Not on file  Other Topics Concern  . Not on file  Social History Narrative   Married   Lives at home with wife Pamala Hurry)   No regular exercise, but does his own yard work and household chores.   Never smoked   Alcohol none   Social Determinants of Health   Financial Resource Strain: Not on file  Food Insecurity: Not on file  Transportation Needs: Not on file  Physical Activity: Not on file  Stress: Not on file  Social Connections: Not on file   Allergies  Allergen Reactions  . Reserpine Other (See Comments)    Unknown reaction  . Sulfa Antibiotics Other (See Comments)    Patient denies - Unknown reaction   Family History  Problem Relation Age of Onset  . Hypertension Mother   . Dementia Mother   . Kidney disease Father   . CAD Father   . Colon cancer Neg Hx   . Throat cancer Neg Hx   . Diabetes Neg Hx   . Liver disease Neg Hx     Current Outpatient Medications (Endocrine & Metabolic):  .  levothyroxine (SYNTHROID) 50 MCG tablet, TAKE ONE TABLET BY MOUTH EVERY MORNING BEFORE BREAKFAST  Current Outpatient Medications (Cardiovascular):  .  lisinopril-hydrochlorothiazide (ZESTORETIC) 20-12.5 MG tablet, TAKE 1/2 TABLET BY MOUTH DAILY   Current Outpatient Medications (Analgesics):  .  acetaminophen (TYLENOL) 325 MG tablet, Take 650 mg by mouth every 6 (six) hours as needed (pain).  Marland Kitchen  oxyCODONE (OXY IR/ROXICODONE) 5 MG immediate release tablet, Take 1 tablet (5 mg total) by mouth every 4 (four) hours as needed (pain).   Current Outpatient Medications (Other):  .  amoxicillin-clavulanate (AUGMENTIN) 875-125 MG tablet, Take 1 tablet by mouth 2 (two) times daily. (Patient not taking: Reported on 07/22/2020) .  brimonidine (ALPHAGAN) 0.2 % ophthalmic solution, Place 1 drop into both eyes 2 (two) times daily.  .   Cholecalciferol 25 MCG (1000 UT) tablet, Take 1,000 Units by mouth daily.  .  DULoxetine (CYMBALTA) 20 MG capsule, Take 1 capsule (20 mg total) by mouth daily. .  timolol (TIMOPTIC) 0.5 % ophthalmic solution, Place 1 drop into the right eye daily.    Reviewed prior external information including notes and imaging from  primary care provider As well as notes that were available from care everywhere and other healthcare systems.  Past medical history, social, surgical and family history all reviewed in electronic medical record.  No pertanent information unless stated regarding to the chief complaint.   Review of Systems:  No headache, visual changes, nausea, vomiting, diarrhea, constipation, dizziness, abdominal pain, skin rash, fevers, chills, night sweats, weight loss, swollen lymph nodes, body aches, joint swelling, chest pain,  shortness of breath, mood changes. POSITIVE muscle aches, body aches but more of the numbness is what is concerning to patient.  Objective  Blood pressure 108/72, pulse 82, height '6\' 1"'  (1.854 m), weight 198 lb 12.8 oz (90.2 kg), SpO2 98 %.   General: No apparent distress alert and oriented x3 mood and affect normal, dressed appropriately.  HEENT: Pupils equal, extraocular movements intact  Respiratory: Patient's speak in full sentences and does not appear short of breath  Severely antalgic walking with the aid of a rolling walker.  Patient has even difficulty standing in one area. Patient does have numbness noted to the lower extremity diffusely.  Patient does have very mild weakness with 4 out of 5 strength though seems to be consistent with patient's baseline of the lower extremities completely.  Appears to have symmetric 1+ DTRs of the Achilles bilaterally.    Impression and Recommendations:     The above documentation has been reviewed and is accurate and complete Lyndal Pulley, DO

## 2020-09-14 NOTE — Therapy (Signed)
[facility] [address] [place], [place], [address] Phone: [phone]   Fax:  [phone]  Physical Therapy Treatment  Patient Details  Name: Kirk [name] MRN: [id number] Date of Birth: Aug 20, 1941 Referring Provider (PT): zach Tamala Julian   Encounter Date: 09/11/2020   PT End of Session - 09/14/20 2132    Visit Number 11    Number of Visits 12    Date for PT Re-Evaluation 09/24/20    Authorization Type HTA    PT Start Time 1432    PT Stop Time 1513    PT Time Calculation (min) 41 min    Equipment Utilized During Treatment Gait belt    Activity Tolerance Patient tolerated treatment well    Behavior During Therapy [facility] for tasks assessed/performed           Past Medical History:  Diagnosis Date  . Cataract   . 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 Cough   . Disturbance of skin sensation   . Diverticulosis of colon 2014  . Diverticulum of esophagus, acquired   . Dysphagia, unspecified(787.20)   . Encounter for long-term (current) use of other medications   . Herpes zoster without mention of complication   . Hyperglycemia    patient denies  . Hyperlipidemia   . Hypertension   . 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 Lumbago   . Nevus, non-neoplastic   . Osteoarthrosis, unspecified whether generalized or localized, unspecified site    pt. denies  . Other premature beats   . Pain in limb   . Plantar fascial fibromatosis   . Special screening for malignant neoplasm of prostate   . Unspecified glaucoma(365.9)   . Unspecified pruritic disorder   . Unspecified vitamin D deficiency   . Urinary frequency   . Zenker's diverticulum     Past Surgical History:  Procedure Laterality Date  . CATARACT EXTRACTION BILATERAL W/ ANTERIOR VITRECTOMY Bilateral 1977  . COLONOSCOPY  09/06/2013   [name]  . DIRECT LARYNGOSCOPY  10/22/2015    Cervical esophagoscopy. Endoscopic esophageal diverticulotomy.   Marland Kitchen [place]  . GLAUCOMA SURGERY  1945   congenital glaucoma  . HERNIA REPAIR  1993  . [place]  2008  . LARYNGOSCOPY     with stapling of zenkers diverticulum  . LUMBAR LAMINECTOMY/ DECOMPRESSION WITH MET-RX Left 08/16/2019   Procedure: Left Lumbar Two-Three Minimally Invasive Laminectomy and Microdiscectomy;  Surgeon: Judith Part, MD;  Location: [facility];  Service: Neurosurgery;  Laterality: Left;  Left Lumbar 2-3 Minimally invasive laminectomy and microdiscectomy    There were no vitals filed for this visit.   Subjective Assessment - 09/14/20 2128    Subjective Pt with no new complaints.    Limitations Standing;Walking;House hold activities    Patient Stated Goals Improved strength of legs, walking, balance    Currently in Pain? Yes    Pain Score 3     Pain Location Leg    Pain Orientation Left;Right    Pain Descriptors / Indicators Numbness    Pain Type Chronic pain    Pain Radiating Towards Pt states no pain, just numbness.    Pain Onset More than a month ago    Pain Frequency Intermittent              [facility] PT Assessment - 09/14/20 0001  Strength   Overall Strength Comments HIps: 4/5 to 4+/5;   Knee: 4+/5 : DF: 3+/5 ;  PF (R) 3-/5, (L) 4-/5                                 PT Education - 09/14/20 2132    Education Details HEP reviewed, discussed in detail    Person(s) Educated Patient    Methods Explanation;Demonstration;Tactile cues;Verbal cues;Handout    Comprehension Verbalized understanding;Returned demonstration;Verbal cues required;Tactile cues required            PT Short Term Goals - 09/14/20 2133      PT SHORT TERM GOAL #1   Title Pt to be independent with initial HEP    Time 2    Period Weeks    Status Achieved    Target Date 08/13/20             PT Long Term Goals - 09/14/20 2133      PT LONG TERM GOAL #1    Title Pt to be indepedent with final HEP for back and LEs    Time 8    Period Weeks    Status Achieved      PT LONG TERM GOAL #2   Title Pt to demo improved strength of bil hips and knees, to at least 4+/5 to improve stability and gait.    Baseline current: Hips: 4 to 4+/5, knees 4+/5,    Time 8    Period Weeks    Status Partially Met      PT LONG TERM GOAL #3   Title Pt to demo independent/safe ability to put walker in car and safely get into car.    Time 8    Period Weeks    Status Achieved      PT LONG TERM GOAL #4   Title Pt to demo improved static balance without AD, to be rated at least (good), for improved ability for ADLS.    Time 8    Period Weeks    Status Not Met      PT LONG TERM GOAL #5   Title Pt to demo independent/safe navigation of at least 5 stairs, with hand rail, for improved safety at home and in community    Time 8    Period Weeks    Status Achieved                 Plan - 09/14/20 2141    Clinical Impression Statement Pt has been seen for 11 visits. He has improved safety awareness, and improved ability for use of RW. Pt now relying in RW at all times. He is unable to stand unsupported for more than a 1-2 min without UE support, due to weakness in LE and feeling like leg is going to buckle. Minimal pain in quad with strength testing, but weakness in Plantar flexors on R are making increased knee flexion/buckling on R with unsupported standing. Weakness is effecting pts safety and mobility. He is currently safe for independent mobility with RW. Pt has not made significant progress with distal weakness. Pt has f/u with MD  next week, will continue based on MD recommendation. Final HEp reviewed in detail today, pt states understanding and importance of continuing strengthening for optimal outcome.    Personal Factors and Comorbidities Comorbidity 1    Comorbidities stenosis    Examination-Activity Limitations Locomotion Level;Bend;Squat;Stairs;Stand     Examination-Participation Restrictions  Cleaning;Community Activity;Shop;Yard Work    Merchant navy officer Evolving/Moderate complexity    Rehab Potential Good    PT Frequency 2x / week    PT Duration 8 weeks    PT Treatment/Interventions ADLs/Self Care Home Management;Cryotherapy;Electrical Stimulation;Gait training;DME Instruction;Ultrasound;Traction;Moist Heat;Stair training;Functional mobility training;Iontophoresis 51m/ml Dexamethasone;Therapeutic activities;Therapeutic exercise;Balance training;Neuromuscular re-education;Manual techniques;Orthotic Fit/Training;Patient/family education;Passive range of motion;Wheelchair mobility training;Dry needling;Taping;Joint Manipulations;Spinal Manipulations;Visual/perceptual remediation/compensation    Consulted and Agree with Plan of Care Patient           Patient will benefit from skilled therapeutic intervention in order to improve the following deficits and impairments:  Abnormal gait,Decreased coordination,Difficulty walking,Decreased safety awareness,Decreased endurance,Decreased activity tolerance,Pain,Impaired vision/preception,Decreased balance,Decreased strength,Decreased mobility  Visit Diagnosis: Muscle weakness (generalized)  Other abnormalities of gait and mobility     Problem List Patient Active Problem List   Diagnosis Date Noted  . Actinic keratosis 06/16/2020  . Bilateral leg weakness 06/16/2020  . Onychomycosis 12/13/2019  . Herniated lumbar intervertebral disc 08/16/2019  . 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 Acute right ankle pain 07/19/2018  . TSH elevation 02/25/2016  . Orthostatic hypotension 02/17/2016  . Syncope 01/07/2016  . 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 Bifascicular block 08/20/2015  . [place]uropathy ([place] 02/05/2015  . Diverticulosis of colon   . Vitamin D deficiency   . Hyperglycemia   . Dysphagia   . Diverticulum of esophagus,  acquired   . Hyperlipidemia   . Glaucoma   . BPH (benign prostatic hyperplasia)   . Essential hypertension 02/01/2013  . 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   LLyndee Hensen PT, DPT 9:48 PM  09/14/20    [facility] [place] [address]Phone: [phone]  Fax:  [phone] Name: JASRIEL WESTRUPMRN: [id number] of Birth: [birth year]

## 2020-09-14 NOTE — Patient Instructions (Signed)
Access Code: [email or web address] URL: [email or web address] Date: 09/14/2020 Prepared by: Lyndee Hensen  Exercises Seated Hamstring Stretch - 2 x daily - 3 reps - 30 hold Single Knee to Chest Stretch - 2 x daily - 3 reps - 30 hold Supine March - 2 x daily - 10 reps - 2 sets - 3 hold Standing Hip Abduction - 1 x daily - 10 reps - 1-2 sets Seated Knee Extension AROM - 1 x daily - 10 reps - 2 sets Straight Leg Raise - 1 x daily - 10 reps - 1 sets Sidelying Hip Abduction - 1 x daily - 10 reps - 2 sets Sit to Stand - 1 x daily - 1 sets - 10 reps Seated Heel Raise - 2 x daily - 2 sets - 10 reps Seated Eccentric Ankle Plantar Flexion with Resistance - Straight Leg - 2 x daily - 1-2 sets - 10 reps

## 2020-09-15 ENCOUNTER — Ambulatory Visit: Payer: PPO | Admitting: Family Medicine

## 2020-09-15 ENCOUNTER — Encounter: Payer: Self-pay | Admitting: Family Medicine

## 2020-09-15 ENCOUNTER — Other Ambulatory Visit: Payer: Self-pay

## 2020-09-15 VITALS — BP 108/72 | HR 82 | Ht 73.0 in | Wt 198.8 lb

## 2020-09-15 DIAGNOSIS — M48061 Spinal stenosis, lumbar region without neurogenic claudication: Secondary | ICD-10-CM

## 2020-09-15 DIAGNOSIS — G629 Polyneuropathy, unspecified: Secondary | ICD-10-CM

## 2020-09-15 DIAGNOSIS — M545 Low back pain, unspecified: Secondary | ICD-10-CM

## 2020-09-15 DIAGNOSIS — G8929 Other chronic pain: Secondary | ICD-10-CM

## 2020-09-15 MED ORDER — DULOXETINE HCL 20 MG PO CPEP: 20.0000 mg | ORAL_CAPSULE | Freq: Every day | ORAL | 0 refills | Status: DC

## 2020-09-15 NOTE — Assessment & Plan Note (Signed)
Patient's nerve conduction study does show that patient does have a mix of spinal radicular symptoms secondary to patient's lumbar spinal stenosis at nearly entire lumbar spine as well as patient having significant peripheral neuropathy.  Patient has been fairly noncompliant with some of his medication encourage him to restart the duloxetine.  This would be one of the treatment options and patient was making some response previously.  Patient's wife who is with him today does state that also his need and anxiety seem to be improved on the medication.  In addition to this we discussed that with the 2 different problems I would consider the possibility of a nerve stimulator. Discussed the possibility of referral to a physician who can consider this with patient significant problems.  Patient has had some difficulty with some of the other medications including gabapentin.  Patient denies a significant fall risk at this moment.  Has failed all other treatment options including physical therapy at this time.  Patient can follow-up with me again in 2 months.  I do feel though that patient's at this point there would be no other significant changes from my treatment however and increasing Cymbalta.  Could consider the possibility of referral to neurology but also do not know if it would be significantly helpful.

## 2020-09-15 NOTE — Assessment & Plan Note (Signed)
As stated with the lumbar stenosis.  Will refer accordingly and continue the Cymbalta at this time now.

## 2020-09-15 NOTE — Patient Instructions (Addendum)
Good to see you!  I've refilled your Cymbalta 20mg  for 30 days.  I will work to find nerve stimulator.  You can use a Eucerin cream for the itching.   See me again in 2 months

## 2020-09-25 ENCOUNTER — Other Ambulatory Visit: Payer: Self-pay

## 2020-09-25 DIAGNOSIS — G8929 Other chronic pain: Secondary | ICD-10-CM

## 2020-09-25 NOTE — Progress Notes (Signed)
Order has been sent and patient has been notified.

## 2020-10-11 ENCOUNTER — Other Ambulatory Visit: Payer: Self-pay | Admitting: Family Medicine

## 2020-10-11 DIAGNOSIS — G8929 Other chronic pain: Secondary | ICD-10-CM

## 2020-10-11 DIAGNOSIS — M545 Low back pain, unspecified: Secondary | ICD-10-CM

## 2020-10-14 DIAGNOSIS — M5459 Other low back pain: Secondary | ICD-10-CM | POA: Diagnosis not present

## 2020-10-14 DIAGNOSIS — M48061 Spinal stenosis, lumbar region without neurogenic claudication: Secondary | ICD-10-CM | POA: Diagnosis not present

## 2020-10-14 DIAGNOSIS — M545 Low back pain, unspecified: Secondary | ICD-10-CM | POA: Diagnosis not present

## 2020-11-03 ENCOUNTER — Telehealth: Payer: Self-pay | Admitting: Family Medicine

## 2020-11-03 NOTE — Telephone Encounter (Signed)
Left message for patient to call back and schedule Medicare Annual Wellness Visit (AWV) either virtually or in office.   Last AWV  05/21/2019 please schedule at anytime with  health coach  This should be a 45 minute visit.

## 2020-11-11 DIAGNOSIS — M5136 Other intervertebral disc degeneration, lumbar region: Secondary | ICD-10-CM | POA: Diagnosis not present

## 2020-11-17 DIAGNOSIS — H02831 Dermatochalasis of right upper eyelid: Secondary | ICD-10-CM | POA: Diagnosis not present

## 2020-11-17 DIAGNOSIS — H02423 Myogenic ptosis of bilateral eyelids: Secondary | ICD-10-CM | POA: Diagnosis not present

## 2020-11-17 DIAGNOSIS — H04123 Dry eye syndrome of bilateral lacrimal glands: Secondary | ICD-10-CM | POA: Diagnosis not present

## 2020-11-17 DIAGNOSIS — H402233 Chronic angle-closure glaucoma, bilateral, severe stage: Secondary | ICD-10-CM | POA: Diagnosis not present

## 2020-11-18 ENCOUNTER — Ambulatory Visit: Payer: PPO | Admitting: Family Medicine

## 2020-11-20 DIAGNOSIS — G894 Chronic pain syndrome: Secondary | ICD-10-CM | POA: Diagnosis not present

## 2020-11-20 DIAGNOSIS — M961 Postlaminectomy syndrome, not elsewhere classified: Secondary | ICD-10-CM | POA: Diagnosis not present

## 2020-11-26 ENCOUNTER — Ambulatory Visit: Payer: PPO | Admitting: Physical Therapy

## 2020-11-26 ENCOUNTER — Other Ambulatory Visit: Payer: Self-pay

## 2020-11-26 DIAGNOSIS — M6281 Muscle weakness (generalized): Secondary | ICD-10-CM | POA: Diagnosis not present

## 2020-11-26 DIAGNOSIS — R2689 Other abnormalities of gait and mobility: Secondary | ICD-10-CM

## 2020-11-30 ENCOUNTER — Encounter: Payer: Self-pay | Admitting: Physical Therapy

## 2020-11-30 NOTE — Therapy (Signed)
[facility] [address] [place], [place], [address] Phone: [phone]   Fax:  [phone]  Physical Therapy Evaluation  Patient Details  Name: Kirk [name] MRN: [id number] Date of Birth: 16-May-1941 Referring Provider (PT): Nelva Bush   Encounter Date: 11/26/2020    Past Medical History:  Diagnosis Date  . Cataract   . Congenital glaucoma    "both eyes operated on when I was 52 months old"  . Cough   . Disturbance of skin sensation   . Diverticulosis of colon 2014  . Diverticulum of esophagus, acquired   . Dysphagia, unspecified(787.20)   . Encounter for long-term (current) use of other medications   . Herpes zoster without mention of complication   . Hyperglycemia    patient denies  . Hyperlipidemia   . Hypertension   . 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 Lumbago   . Nevus, non-neoplastic   . Osteoarthrosis, unspecified whether generalized or localized, unspecified site    pt. denies  . Other premature beats   . Pain in limb   . Plantar fascial fibromatosis   . Special screening for malignant neoplasm of prostate   . Unspecified glaucoma(365.9)   . Unspecified pruritic disorder   . Unspecified vitamin D deficiency   . Urinary frequency   . Zenker's diverticulum     Past Surgical History:  Procedure Laterality Date  . CATARACT EXTRACTION BILATERAL W/ ANTERIOR VITRECTOMY Bilateral 1977  . COLONOSCOPY  09/06/2013   [name]  . DIRECT LARYNGOSCOPY  10/22/2015   Cervical esophagoscopy. Endoscopic esophageal diverticulotomy.   Marland Kitchen [place]  . GLAUCOMA SURGERY  1945   congenital glaucoma  . HERNIA REPAIR  1993  . [place]  2008  . LARYNGOSCOPY     with stapling of zenkers diverticulum  . LUMBAR LAMINECTOMY/ DECOMPRESSION WITH MET-RX Left 08/16/2019   Procedure: Left Lumbar Two-Three Minimally Invasive Laminectomy and  Microdiscectomy;  Surgeon: Judith Part, MD;  Location: [facility];  Service: Neurosurgery;  Laterality: Left;  Left Lumbar 2-3 Minimally invasive laminectomy and microdiscectomy    There were no vitals filed for this visit.    Subjective Assessment - 11/30/20 1441    Subjective Pt with new referral for PT. PT last seen in Dec 2021. He continues to have difficulty with strength of LEs. Requires use of 4WW for support, unable to ambulate without it. Is candidate for nerve stimulator will f/u with MD for this. Was referred back to PT for strengthening prior to further intervention.    Limitations Standing;Walking;House hold activities    Patient Stated Goals Improved strength of legs, walking, balance    Currently in Pain? Yes    Pain Score 3     Pain Location Leg    Pain Onset More than a month ago              [facility] PT Assessment - 11/30/20 0001      Assessment   Medical Diagnosis Post laminectomy syndrome    Referring Provider (PT) Ramos    Prior Therapy yes      Precautions   Precautions Fall      Balance Screen   Has the patient fallen in the past 6 months No      Prior Function   Level of Independence Independent with household mobility with device      Cognition   Overall Cognitive Status Within Functional Limits for  tasks assessed      Strength   Overall Strength Comments HIps: 4/5 to 4+/5;   Knee Ext: 4+/5; Knee flex R: 4/5, L: 4+/5  : DF: R: 4-/5 , L: 3-/5;  PF:  (R) 2+/5 to 3-/5, (L) 4-/5:   EV: R:2+ to 3-/5, L: 4/5      Palpation   Palpation comment Significant hamstring limitation bil: R>L. Significant weakness in R foot.      Special Tests   Other special tests Static standing: pt able to stand for 30 sec - 1 min without UE support due to weakness in foot and LE.      Ambulation/Gait   Gait Comments Definite reliance of RW for support/balance, pt very reluctant to stand without UE suppor due to fear of falling.                       Objective measurements completed on examination: See above findings.               PT Education - 11/30/20 2155    Education Details PT POC, discussed current deficits    Person(s) Educated Patient    Methods Explanation;Demonstration;Verbal cues    Comprehension Verbalized understanding;Returned demonstration;Verbal cues required;Need further instruction            PT Short Term Goals - 11/30/20 2157      PT SHORT TERM GOAL #1   Title Pt to be independent with initial HEP    Time 2    Period Weeks    Status Achieved    Target Date 12/10/20             PT Long Term Goals - 11/30/20 2157      PT LONG TERM GOAL #1   Title Pt to be indepedent with final HEP for back and LEs    Time 6    Period Weeks    Status New    Target Date 01/07/21      PT LONG TERM GOAL #2   Title Pt to demo improved strength of bil hips and knees, to 5/5 to improve stability and gait.    Time 6    Period Weeks    Status New    Target Date 01/07/21      PT LONG TERM GOAL #3   Title Pt to demo static and dynamic standing balance to be rated at least good, to reduce fall risk and improve safety with functional activities .    Time 6    Period Weeks    Target Date 01/07/21      PT LONG TERM GOAL #4   Title Pt to demo ability for safe navigation with RW at curb and to car, to improve safety and fall risk.    Time 6    Period Weeks    Status New    Target Date 01/07/21                  Plan - 11/30/20 2203    Clinical Impression Statement Pt presents with continued weakness in LEs. He has been seen in PT previously without significant improvment of weakness. Pt wiht + findings on recent nerve conduction test that correspond with his significant distal weakness. Weakness in plantar flexors and ankles is effecting pts gait and safety, with much reliance on RW. Pt very reluctant to stand without RW for tesing due to fear of leg buckling. Pt to benefit  from PT  to review safety implications, ensure safety with RW for home and community use, to ensure optimal HEP and to improve strength for improved funcitonal mobility and decreased fall risk.    Personal Factors and Comorbidities Comorbidity 1    Comorbidities stenosis    Examination-Activity Limitations Locomotion Level;Bend;Squat;Stairs;Stand    Examination-Participation Restrictions Cleaning;Community Activity;Shop;Yard Work    [employer] officer Evolving/Moderate complexity    Clinical Decision Making Moderate    Rehab Potential Good    PT Frequency 2x / week    PT Duration 6 weeks    PT Treatment/Interventions ADLs/Self Care Home Management;Cryotherapy;Electrical Stimulation;Gait training;DME Instruction;Ultrasound;Traction;Moist Heat;Stair training;Functional mobility training;Iontophoresis 83m/ml Dexamethasone;Therapeutic activities;Therapeutic exercise;Balance training;Neuromuscular re-education;Manual techniques;Orthotic Fit/Training;Patient/family education;Passive range of motion;Wheelchair mobility training;Dry needling;Taping;Joint Manipulations;Spinal Manipulations;Visual/perceptual remediation/compensation;Energy conservation    Consulted and Agree with Plan of Care Patient           Patient will benefit from skilled therapeutic intervention in order to improve the following deficits and impairments:  Abnormal gait,Decreased coordination,Difficulty walking,Decreased safety awareness,Decreased endurance,Decreased activity tolerance,Pain,Impaired vision/preception,Decreased balance,Decreased strength,Decreased mobility,Impaired flexibility,Decreased range of motion,Decreased knowledge of precautions  Visit Diagnosis: Muscle weakness (generalized)  Other abnormalities of gait and mobility     Problem List Patient Active Problem List   Diagnosis Date Noted  . Actinic keratosis 06/16/2020  . Bilateral leg weakness 06/16/2020  . Onychomycosis 12/13/2019  .  Herniated lumbar intervertebral disc 08/16/2019  . 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 Acute right ankle pain 07/19/2018  . TSH elevation 02/25/2016  . Orthostatic hypotension 02/17/2016  . Syncope 01/07/2016  . 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 Bifascicular block 08/20/2015  . [place]urop[place] 02/05/2015  . Diverticulosis of colon   . Vitamin D deficiency   . Hyperglycemia   . Dysphagia   . Diverticulum of esophagus, acquired   . Hyperlipidemia   . Glaucoma   . BPH (benign prostatic hyperplasia)   . Essential hypertension 02/01/2013  . 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    LLyndee Hensen PT, DPT 10:10 PM  11/30/20    [place] [address]Phone: [phone]  Fax:  [phone] Name: Kirk TURAYMRN: [id number] of Birth: 3Mar 03, 1942

## 2020-12-02 ENCOUNTER — Ambulatory Visit: Payer: PPO | Admitting: Physical Therapy

## 2020-12-02 ENCOUNTER — Encounter: Payer: Self-pay | Admitting: Physical Therapy

## 2020-12-02 ENCOUNTER — Other Ambulatory Visit: Payer: Self-pay

## 2020-12-02 DIAGNOSIS — M6281 Muscle weakness (generalized): Secondary | ICD-10-CM

## 2020-12-02 DIAGNOSIS — R2689 Other abnormalities of gait and mobility: Secondary | ICD-10-CM

## 2020-12-02 NOTE — Therapy (Signed)
[facility] [address] [place], [place], [address] Phone: [phone]   Fax:  [phone]  Physical Therapy Treatment  Patient Details  Name: Kirk [name] MRN: [id number] Date of Birth: 1941/05/02 Referring Provider (PT): Nelva Bush   Encounter Date: 12/02/2020   PT End of Session - 12/02/20 1658    Visit Number 2    Number of Visits 8    Date for PT Re-Evaluation 01/07/21    Authorization Type HTA    PT Start Time 1600    PT Stop Time 5852    PT Time Calculation (min) 44 min    Equipment Utilized During Treatment Gait belt    Activity Tolerance Patient tolerated treatment well    Behavior During Therapy [facility] for tasks assessed/performed           Past Medical History:  Diagnosis Date  . Cataract   . Congenital glaucoma    "both eyes operated on when I was 55 months old"  . Cough   . Disturbance of skin sensation   . Diverticulosis of colon 2014  . Diverticulum of esophagus, acquired   . Dysphagia, unspecified(787.20)   . Encounter for long-term (current) use of other medications   . Herpes zoster without mention of complication   . Hyperglycemia    patient denies  . Hyperlipidemia   . Hypertension   . 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 Lumbago   . Nevus, non-neoplastic   . Osteoarthrosis, unspecified whether generalized or localized, unspecified site    pt. denies  . Other premature beats   . Pain in limb   . Plantar fascial fibromatosis   . Special screening for malignant neoplasm of prostate   . Unspecified glaucoma(365.9)   . Unspecified pruritic disorder   . Unspecified vitamin D deficiency   . Urinary frequency   . Zenker's diverticulum     Past Surgical History:  Procedure Laterality Date  . CATARACT EXTRACTION BILATERAL W/ ANTERIOR VITRECTOMY Bilateral 1977  . COLONOSCOPY  09/06/2013   [name]  . DIRECT LARYNGOSCOPY  10/22/2015   Cervical  esophagoscopy. Endoscopic esophageal diverticulotomy.   Marland Kitchen [place]  . GLAUCOMA SURGERY  1945   congenital glaucoma  . HERNIA REPAIR  1993  . [place]  2008  . LARYNGOSCOPY     with stapling of zenkers diverticulum  . LUMBAR LAMINECTOMY/ DECOMPRESSION WITH MET-RX Left 08/16/2019   Procedure: Left Lumbar Two-Three Minimally Invasive Laminectomy and Microdiscectomy;  Surgeon: Judith Part, MD;  Location: [facility];  Service: Neurosurgery;  Laterality: Left;  Left Lumbar 2-3 Minimally invasive laminectomy and microdiscectomy    There were no vitals filed for this visit.   Subjective Assessment - 12/02/20 1658    Subjective Pt with no new complaints    Limitations Standing;Walking;House hold activities    Patient Stated Goals Improved strength of legs, walking, balance    Currently in Pain? No/denies    Pain Score 0-No pain    Pain Onset More than a month ago                             [facility] Adult PT Treatment/Exercise - 12/02/20 0001      Knee/Hip Exercises: Stretches   Active Hamstring Stretch 4 reps;30 seconds    Active Hamstring Stretch Limitations seated      Knee/Hip Exercises: Standing  Other Standing Knee Exercises mini squat x 10    Other Standing Knee Exercises Unsupported standing: L/R weight shifts x 20; Standing x 1 min without UE support      Knee/Hip Exercises: Seated   Other Seated Knee/Hip Exercises Ankle PFRTB x 20 bil;  EV with YTB on R;    Hamstring Curl 20 reps;Both    Sit to Sand 10 reps;with UE support   therapist UE support and cuing for anterior weight shift                   PT Short Term Goals - 11/30/20 2157      PT SHORT TERM GOAL #1   Title Pt to be independent with initial HEP    Time 2    Period Weeks    Status Achieved    Target Date 12/10/20             PT Long Term Goals - 11/30/20 2157      PT LONG TERM GOAL #1   Title Pt to be indepedent with final HEP for  back and LEs    Time 6    Period Weeks    Status New    Target Date 01/07/21      PT LONG TERM GOAL #2   Title Pt to demo improved strength of bil hips and knees, to 5/5 to improve stability and gait.    Time 6    Period Weeks    Status New    Target Date 01/07/21      PT LONG TERM GOAL #3   Title Pt to demo static and dynamic standing balance to be rated at least good, to reduce fall risk and improve safety with functional activities .    Time 6    Period Weeks    Target Date 01/07/21      PT LONG TERM GOAL #4   Title Pt to demo ability for safe navigation with RW at curb and to car, to improve safety and fall risk.    Time 6    Period Weeks    Status New    Target Date 01/07/21                 Plan - 12/02/20 1659    Clinical Impression Statement Pt very hesitant for standing without UE support. Ther ex performed today for LE strengthening. Pt req max verbal and tactile cuing for correct performance. Plan to progress strength and standing stability as tolerated.    Personal Factors and Comorbidities Comorbidity 1    Comorbidities stenosis    Examination-Activity Limitations Locomotion Level;Bend;Squat;Stairs;Stand    Examination-Participation Restrictions Cleaning;Community Activity;Shop;Yard Work    [employer]chant [employer] officer Evolving/Moderate complexity    Rehab Potential Good    PT Frequency 2x / week    PT Duration 6 weeks    PT Treatment/Interventions ADLs/Self Care Home Management;Cryotherapy;Electrical Stimulation;Gait training;DME Instruction;Ultrasound;Traction;Moist Heat;Stair training;Functional mobility training;Iontophoresis 38m/ml Dexamethasone;Therapeutic activities;Therapeutic exercise;Balance training;Neuromuscular re-education;Manual techniques;Orthotic Fit/Training;Patient/family education;Passive range of motion;Wheelchair mobility training;Dry needling;Taping;Joint Manipulations;Spinal Manipulations;Visual/perceptual  remediation/compensation;Energy conservation    Consulted and Agree with Plan of Care Patient           Patient will benefit from skilled therapeutic intervention in order to improve the following deficits and impairments:  Abnormal gait,Decreased coordination,Difficulty walking,Decreased safety awareness,Decreased endurance,Decreased activity tolerance,Pain,Impaired vision/preception,Decreased balance,Decreased strength,Decreased mobility,Impaired flexibility,Decreased range of motion,Decreased knowledge of precautions  Visit Diagnosis: Muscle weakness (generalized)  Other abnormalities of gait and mobility  Problem List Patient Active Problem List   Diagnosis Date Noted  . Actinic keratosis 06/16/2020  . Bilateral leg weakness 06/16/2020  . Onychomycosis 12/13/2019  . Herniated lumbar intervertebral disc 08/16/2019  . 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 Acute right ankle pain 07/19/2018  . TSH elevation 02/25/2016  . Orthostatic hypotension 02/17/2016  . Syncope 01/07/2016  . 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 Bifascicular block 08/20/2015  . Neuropathy ([place]) 02/05/2015  . Diverticulosis of colon   . Vitamin D deficiency   . Hyperglycemia   . Dysphagia   . Diverticulum of esophagus, acquired   . Hyperlipidemia   . Glaucoma   . BPH (benign prostatic hyperplasia)   . Essential hypertension 02/01/2013  . 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    Lyndee Hensen, PT, DPT 5:03 PM  12/02/20    [facility] [place], [place], [address] Phone: [phone]   Fax:  [phone]  Name: Kirk [name] MRN: [id number] Date of Birth: August 30, 1941

## 2020-12-04 ENCOUNTER — Ambulatory Visit: Payer: PPO | Admitting: Physical Therapy

## 2020-12-04 ENCOUNTER — Encounter: Payer: Self-pay | Admitting: Physical Therapy

## 2020-12-04 ENCOUNTER — Other Ambulatory Visit: Payer: Self-pay

## 2020-12-04 DIAGNOSIS — M6281 Muscle weakness (generalized): Secondary | ICD-10-CM | POA: Diagnosis not present

## 2020-12-04 DIAGNOSIS — R2689 Other abnormalities of gait and mobility: Secondary | ICD-10-CM | POA: Diagnosis not present

## 2020-12-07 ENCOUNTER — Encounter: Payer: Self-pay | Admitting: Physical Therapy

## 2020-12-07 NOTE — Therapy (Signed)
[facility] [address] [place], [place], [address] Phone: [phone]   Fax:  [phone]  Physical Therapy Treatment  Patient Details  Name: Kirk [name] MRN: [id number] Date of Birth: 1941/05/10 Referring Provider (PT): Nelva Bush   Encounter Date: 12/04/2020   PT End of Session - 12/07/20 0701    Visit Number 3    Number of Visits 8    Date for PT Re-Evaluation 01/07/21    Authorization Type HTA    PT Start Time 1602    PT Stop Time 1643    PT Time Calculation (min) 41 min    Equipment Utilized During Treatment Gait belt    Activity Tolerance Patient tolerated treatment well    Behavior During Therapy [facility] for tasks assessed/performed           Past Medical History:  Diagnosis Date  . Cataract   . Congenital glaucoma    "both eyes operated on when I was 51 months old"  . Cough   . Disturbance of skin sensation   . Diverticulosis of colon 2014  . Diverticulum of esophagus, acquired   . Dysphagia, unspecified(787.20)   . Encounter for long-term (current) use of other medications   . Herpes zoster without mention of complication   . Hyperglycemia    patient denies  . Hyperlipidemia   . Hypertension   . 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 Lumbago   . Nevus, non-neoplastic   . Osteoarthrosis, unspecified whether generalized or localized, unspecified site    pt. denies  . Other premature beats   . Pain in limb   . Plantar fascial fibromatosis   . Special screening for malignant neoplasm of prostate   . Unspecified glaucoma(365.9)   . Unspecified pruritic disorder   . Unspecified vitamin D deficiency   . Urinary frequency   . Zenker's diverticulum     Past Surgical History:  Procedure Laterality Date  . CATARACT EXTRACTION BILATERAL W/ ANTERIOR VITRECTOMY Bilateral 1977  . COLONOSCOPY  09/06/2013   [name]  . DIRECT LARYNGOSCOPY  10/22/2015   Cervical  esophagoscopy. Endoscopic esophageal diverticulotomy.   Marland Kitchen [place]  . GLAUCOMA SURGERY  1945   congenital glaucoma  . HERNIA REPAIR  1993  . [place]  2008  . LARYNGOSCOPY     with stapling of zenkers diverticulum  . LUMBAR LAMINECTOMY/ DECOMPRESSION WITH MET-RX Left 08/16/2019   Procedure: Left Lumbar Two-Three Minimally Invasive Laminectomy and Microdiscectomy;  Surgeon: Judith Part, MD;  Location: [facility];  Service: Neurosurgery;  Laterality: Left;  Left Lumbar 2-3 Minimally invasive laminectomy and microdiscectomy    There were no vitals filed for this visit.   Subjective Assessment - 12/07/20 0700    Subjective Pt with no new complaints.    Patient Stated Goals Improved strength of legs, walking, balance    Currently in Pain? No/denies                             [facility] Adult PT Treatment/Exercise - 12/07/20 0001      Knee/Hip Exercises: Standing   Other Standing Knee Exercises mini squat x 10, at mat table with close supervision    Other Standing Knee Exercises Unsupported standing: L/R weight shifts x 20; Standing x 1 min without UE support with close supervision      Knee/Hip Exercises: Seated  Other Seated Knee/Hip Exercises Ankle PF RTB x 20 bil;  EV with YTB bil; ;    Sit to Sand 10 reps;with UE support   education on form and not using back of legs on mat table     Knee/Hip Exercises: Supine   Bridges 15 reps      Knee/Hip Exercises: Sidelying   Hip ABduction 10 reps      Manual Therapy   Manual Therapy Passive ROM    Passive ROM S/L hip flexor and quad stretches x 2 min bil; Manual hip flexion stretching, Manual HS stretches.    Manual Traction long leg distraction bil LE for lumbar pump x 1  min ea;                  PT Education - 12/07/20 0701    Education Details Reviewed HEP    Person(s) Educated Patient    Methods Explanation;Demonstration;Tactile cues;Verbal cues;Handout     Comprehension Verbalized understanding;Returned demonstration;Verbal cues required;Tactile cues required            PT Short Term Goals - 11/30/20 2157      PT SHORT TERM GOAL #1   Title Pt to be independent with initial HEP    Time 2    Period Weeks    Status Achieved    Target Date 12/10/20             PT Long Term Goals - 11/30/20 2157      PT LONG TERM GOAL #1   Title Pt to be indepedent with final HEP for back and LEs    Time 6    Period Weeks    Status New    Target Date 01/07/21      PT LONG TERM GOAL #2   Title Pt to demo improved strength of bil hips and knees, to 5/5 to improve stability and gait.    Time 6    Period Weeks    Status New    Target Date 01/07/21      PT LONG TERM GOAL #3   Title Pt to demo static and dynamic standing balance to be rated at least good, to reduce fall risk and improve safety with functional activities .    Time 6    Period Weeks    Target Date 01/07/21      PT LONG TERM GOAL #4   Title Pt to demo ability for safe navigation with RW at curb and to car, to improve safety and fall risk.    Time 6    Period Weeks    Status New    Target Date 01/07/21                 Plan - 12/07/20 0703    Clinical Impression Statement Pt req max tactile and verbal cueing to practice unsupported standing(in front of mat table with close supervision). Pt requires sitting break after less than 2 min of standing, due to feeling like legs are going to buckle from weakness. Pt with much tightness in hip flexors, quads and hamstrings, manual stretching for this today. Plan to progress standing balance and strengthening as tolerated.    Personal Factors and Comorbidities Comorbidity 1    Comorbidities stenosis    Examination-Activity Limitations Locomotion Level;Bend;Squat;Stairs;Stand    Examination-Participation Restrictions Cleaning;Community Activity;Shop;Yard Work    [employer]chant [employer] officer Evolving/Moderate complexity     Rehab Potential Good    PT Frequency 2x / week  PT Duration 6 weeks    PT Treatment/Interventions ADLs/Self Care Home Management;Cryotherapy;Electrical Stimulation;Gait training;DME Instruction;Ultrasound;Traction;Moist Heat;Stair training;Functional mobility training;Iontophoresis 81m/ml Dexamethasone;Therapeutic activities;Therapeutic exercise;Balance training;Neuromuscular re-education;Manual techniques;Orthotic Fit/Training;Patient/family education;Passive range of motion;Wheelchair mobility training;Dry needling;Taping;Joint Manipulations;Spinal Manipulations;Visual/perceptual remediation/compensation;Energy conservation    Consulted and Agree with Plan of Care Patient           Patient will benefit from skilled therapeutic intervention in order to improve the following deficits and impairments:  Abnormal gait,Decreased coordination,Difficulty walking,Decreased safety awareness,Decreased endurance,Decreased activity tolerance,Pain,Impaired vision/preception,Decreased balance,Decreased strength,Decreased mobility,Impaired flexibility,Decreased range of motion,Decreased knowledge of precautions  Visit Diagnosis: Muscle weakness (generalized)  Other abnormalities of gait and mobility     Problem List Patient Active Problem List   Diagnosis Date Noted  . Actinic keratosis 06/16/2020  . Bilateral leg weakness 06/16/2020  . Onychomycosis 12/13/2019  . Herniated lumbar intervertebral disc 08/16/2019  . 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 Acute right ankle pain 07/19/2018  . TSH elevation 02/25/2016  . Orthostatic hypotension 02/17/2016  . Syncope 01/07/2016  . 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 Bifascicular block 08/20/2015  . Neuropathy ([place] 02/05/2015  . Diverticulosis of colon   . Vitamin D deficiency   . Hyperglycemia   . Dysphagia   . Diverticulum of esophagus, acquired   . Hyperlipidemia   . Glaucoma   . BPH (benign  prostatic hyperplasia)   . Essential hypertension 02/01/2013  . 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   Kirk [name] PT, DPT 7:06 AM  12/07/20    [facility] [address]Phone: [phone]  Fax:  [phone] Name: Kirk REMACHEMRN: [id number] of Birth: [birth year]

## 2020-12-09 ENCOUNTER — Ambulatory Visit: Payer: PPO | Admitting: Physical Therapy

## 2020-12-09 DIAGNOSIS — M6281 Muscle weakness (generalized): Secondary | ICD-10-CM

## 2020-12-09 DIAGNOSIS — R2689 Other abnormalities of gait and mobility: Secondary | ICD-10-CM | POA: Diagnosis not present

## 2020-12-11 ENCOUNTER — Encounter: Payer: Self-pay | Admitting: Physical Therapy

## 2020-12-11 ENCOUNTER — Other Ambulatory Visit: Payer: Self-pay | Admitting: Family Medicine

## 2020-12-11 ENCOUNTER — Ambulatory Visit: Payer: PPO | Admitting: Physical Therapy

## 2020-12-11 ENCOUNTER — Other Ambulatory Visit: Payer: Self-pay

## 2020-12-11 DIAGNOSIS — M6281 Muscle weakness (generalized): Secondary | ICD-10-CM | POA: Diagnosis not present

## 2020-12-11 DIAGNOSIS — R2689 Other abnormalities of gait and mobility: Secondary | ICD-10-CM

## 2020-12-11 NOTE — Therapy (Signed)
[facility] [address] [place], [place], [address] Phone: [phone]   Fax:  [phone]  Physical Therapy Treatment  Patient Details  Name: Kirk [name] MRN: [id number] Date of Birth: [birth year] Referring Provider (PT): Nelva Bush   Encounter Date: 12/09/2020   PT End of Session - 12/11/20 1458    Visit Number 4    Number of Visits 8    Date for PT Re-Evaluation 01/07/21    Authorization Type HTA    PT Start Time 1603    PT Stop Time 3846    PT Time Calculation (min) 41 min    Equipment Utilized During Treatment Gait belt    Activity Tolerance Patient tolerated treatment well    Behavior During Therapy [facility] for tasks assessed/performed           Past Medical History:  Diagnosis Date  . Cataract   . Congenital glaucoma    "both eyes operated on when I was 32 months old"  . Cough   . Disturbance of skin sensation   . Diverticulosis of colon 2014  . Diverticulum of esophagus, acquired   . Dysphagia, unspecified(787.20)   . Encounter for long-term (current) use of other medications   . Herpes zoster without mention of complication   . Hyperglycemia    patient denies  . Hyperlipidemia   . Hypertension   . 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 Lumbago   . Nevus, non-neoplastic   . Osteoarthrosis, unspecified whether generalized or localized, unspecified site    pt. denies  . Other premature beats   . Pain in limb   . Plantar fascial fibromatosis   . Special screening for malignant neoplasm of prostate   . Unspecified glaucoma(365.9)   . Unspecified pruritic disorder   . Unspecified vitamin D deficiency   . Urinary frequency   . Zenker's diverticulum     Past Surgical History:  Procedure Laterality Date  . CATARACT EXTRACTION BILATERAL W/ ANTERIOR VITRECTOMY Bilateral 1977  . COLONOSCOPY  09/06/2013   [name]  . DIRECT LARYNGOSCOPY  10/22/2015   Cervical  esophagoscopy. Endoscopic esophageal diverticulotomy.   Marland Kitchen [place]  . GLAUCOMA SURGERY  1945   congenital glaucoma  . HERNIA REPAIR  1993  . [place]  2008  . LARYNGOSCOPY     with stapling of zenkers diverticulum  . LUMBAR LAMINECTOMY/ DECOMPRESSION WITH MET-RX Left 08/16/2019   Procedure: Left Lumbar Two-Three Minimally Invasive Laminectomy and Microdiscectomy;  Surgeon: Judith Part, MD;  Location: [facility];  Service: Neurosurgery;  Laterality: Left;  Left Lumbar 2-3 Minimally invasive laminectomy and microdiscectomy    There were no vitals filed for this visit.   Subjective Assessment - 12/11/20 1458    Subjective Pt thinks he is "getting better" but unable to state what he thinks is better.    Currently in Pain? No/denies    Pain Score 0-No pain                             [facility] Adult PT Treatment/Exercise - 12/11/20 0001      Knee/Hip Exercises: Stretches   Active Hamstring Stretch 4 reps;30 seconds    Active Hamstring Stretch Limitations seated      Knee/Hip Exercises: Standing   Hip Flexion 20 reps;Knee bent    Hip Abduction 20 reps    Other Standing Knee Exercises  Unsupported standing: L/R and A/P weight shifts x 20; Standing x 1 min without UE support with close supervision      Knee/Hip Exercises: Seated   Other Seated Knee/Hip Exercises Ankle PF RTB x 20 bil;  EV with YTB bil; ;    Other Seated Knee/Hip Exercises Heel raises x 15; (fatigued at 15 reps)    Sit to Sand 10 reps;with UE support   education on mechanics and set up     Knee/Hip Exercises: Supine   Bridges 15 reps    Straight Leg Raises 15 reps      Manual Therapy   Passive ROM S/L hip flexor and quad stretches x 2 min bil; Manual hip flexion stretching, Manual HS stretches.    Manual Traction long leg distraction bil LE for lumbar pump x 1  min ea;                    PT Short Term Goals - 11/30/20 2157      PT SHORT TERM GOAL  #1   Title Pt to be independent with initial HEP    Time 2    Period Weeks    Status Achieved    Target Date 12/10/20             PT Long Term Goals - 11/30/20 2157      PT LONG TERM GOAL #1   Title Pt to be indepedent with final HEP for back and LEs    Time 6    Period Weeks    Status New    Target Date 01/07/21      PT LONG TERM GOAL #2   Title Pt to demo improved strength of bil hips and knees, to 5/5 to improve stability and gait.    Time 6    Period Weeks    Status New    Target Date 01/07/21      PT LONG TERM GOAL #3   Title Pt to demo static and dynamic standing balance to be rated at least good, to reduce fall risk and improve safety with functional activities .    Time 6    Period Weeks    Target Date 01/07/21      PT LONG TERM GOAL #4   Title Pt to demo ability for safe navigation with RW at curb and to car, to improve safety and fall risk.    Time 6    Period Weeks    Status New    Target Date 01/07/21                 Plan - 12/11/20 1502    Clinical Impression Statement Limited ability for unsupported standing, pt feels legs are going to buckle and requests to sit after 1 min consistently. Plantar flexors fatigued after 15 reps , when performing seated heel raises. Pt doing well with more proximal strengthening for hips and bridging. Distal weakness limiting standing capacity.    Personal Factors and Comorbidities Comorbidity 1    Comorbidities stenosis    Examination-Activity Limitations Locomotion Level;Bend;Squat;Stairs;Stand    Examination-Participation Restrictions Cleaning;Community Activity;Shop;Yard Work    [employer]chant [employer] officer Evolving/Moderate complexity    Rehab Potential Good    PT Frequency 2x / week    PT Duration 6 weeks    PT Treatment/Interventions ADLs/Self Care Home Management;Cryotherapy;Electrical Stimulation;Gait training;DME Instruction;Ultrasound;Traction;Moist Heat;Stair training;Functional mobility  training;Iontophoresis 25m/ml Dexamethasone;Therapeutic activities;Therapeutic exercise;Balance training;Neuromuscular re-education;Manual techniques;Orthotic Fit/Training;Patient/family education;Passive range of motion;Wheelchair mobility training;Dry needling;Taping;Joint Manipulations;Spinal  Manipulations;Visual/perceptual remediation/compensation;Energy conservation    Consulted and Agree with Plan of Care Patient           Patient will benefit from skilled therapeutic intervention in order to improve the following deficits and impairments:  Abnormal gait,Decreased coordination,Difficulty walking,Decreased safety awareness,Decreased endurance,Decreased activity tolerance,Pain,Impaired vision/preception,Decreased balance,Decreased strength,Decreased mobility,Impaired flexibility,Decreased range of motion,Decreased knowledge of precautions  Visit Diagnosis: Muscle weakness (generalized)  Other abnormalities of gait and mobility     Problem List Patient Active Problem List   Diagnosis Date Noted  . Actinic keratosis 06/16/2020  . Bilateral leg weakness 06/16/2020  . Onychomycosis 12/13/2019  . Herniated lumbar intervertebral disc 08/16/2019  . 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 Acute right ankle pain 07/19/2018  . TSH elevation 02/25/2016  . Orthostatic hypotension 02/17/2016  . Syncope 01/07/2016  . 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 Bifascicular block 08/20/2015  . Neuropathy ([place]) 02/05/2015  . Diverticulosis of colon   . Vitamin D deficiency   . Hyperglycemia   . Dysphagia   . Diverticulum of esophagus, acquired   . Hyperlipidemia   . Glaucoma   . BPH (benign prostatic hyperplasia)   . Essential hypertension 02/01/2013  . 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    Lyndee Hensen, PT, DPT 3:04 PM  12/11/20    [facility] [place], [place], [address] Phone:  [phone]   Fax:  [phone]  Name: Kirk [name] MRN: [id number] Date of Birth: [birth year]

## 2020-12-13 ENCOUNTER — Encounter: Payer: Self-pay | Admitting: Physical Therapy

## 2020-12-13 NOTE — Therapy (Signed)
[facility] [address] [place], [place], [address] Phone: [phone]   Fax:  [phone]  Physical Therapy Treatment  Patient Details  Name: Kirk [name] MRN: [id number] Date of Birth: [birth year] Referring Provider (PT): Nelva Bush   Encounter Date: 12/11/2020   PT End of Session - 12/13/20 0831    Visit Number 5    Number of Visits 8    Date for PT Re-Evaluation 01/07/21    Authorization Type HTA    PT Start Time 1602    PT Stop Time 2979    PT Time Calculation (min) 42 min    Equipment Utilized During Treatment Gait belt    Activity Tolerance Patient tolerated treatment well    Behavior During Therapy [facility] for tasks assessed/performed           Past Medical History:  Diagnosis Date  . Cataract   . Congenital glaucoma    "both eyes operated on when I was 8 months old"  . Cough   . Disturbance of skin sensation   . Diverticulosis of colon 2014  . Diverticulum of esophagus, acquired   . Dysphagia, unspecified(787.20)   . Encounter for long-term (current) use of other medications   . Herpes zoster without mention of complication   . Hyperglycemia    patient denies  . Hyperlipidemia   . Hypertension   . 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 Lumbago   . Nevus, non-neoplastic   . Osteoarthrosis, unspecified whether generalized or localized, unspecified site    pt. denies  . Other premature beats   . Pain in limb   . Plantar fascial fibromatosis   . Special screening for malignant neoplasm of prostate   . Unspecified glaucoma(365.9)   . Unspecified pruritic disorder   . Unspecified vitamin D deficiency   . Urinary frequency   . Zenker's diverticulum     Past Surgical History:  Procedure Laterality Date  . CATARACT EXTRACTION BILATERAL W/ ANTERIOR VITRECTOMY Bilateral 1977  . COLONOSCOPY  09/06/2013   [name]  . DIRECT LARYNGOSCOPY  10/22/2015   Cervical  esophagoscopy. Endoscopic esophageal diverticulotomy.   Marland Kitchen [place]  . GLAUCOMA SURGERY  1945   congenital glaucoma  . HERNIA REPAIR  1993  . [place]  2008  . LARYNGOSCOPY     with stapling of zenkers diverticulum  . LUMBAR LAMINECTOMY/ DECOMPRESSION WITH MET-RX Left 08/16/2019   Procedure: Left Lumbar Two-Three Minimally Invasive Laminectomy and Microdiscectomy;  Surgeon: Judith Part, MD;  Location: [facility];  Service: Neurosurgery;  Laterality: Left;  Left Lumbar 2-3 Minimally invasive laminectomy and microdiscectomy    There were no vitals filed for this visit.   Subjective Assessment - 12/13/20 0827    Subjective Pt states he is doing HEP    Limitations Standing;House hold activities;Walking;Lifting    Currently in Pain? No/denies    Pain Score 0-No pain                             [facility] Adult PT Treatment/Exercise - 12/13/20 0001      Knee/Hip Exercises: Stretches   Active Hamstring Stretch 4 reps;30 seconds    Active Hamstring Stretch Limitations seated    Other Knee/Hip Stretches Manual hip flexor stretching off side of table x 2 min bil;      Knee/Hip Exercises: Standing   Hip Flexion 20  reps;Knee bent    Other Standing Knee Exercises therapist assist, 1 hand  L/R and A/P weight shifts x 20; Standing x 2 min without UE support with close supervision; Side stepping in front of mat table 5 ft x 6;      Knee/Hip Exercises: Seated   Other Seated Knee/Hip Exercises Ankle PF RTB x 20 bil;  EV with YTB bil; ;    Other Seated Knee/Hip Exercises Heel raises x 20;    Sit to Sand 10 reps;with UE support   education on mechanics and set up     Knee/Hip Exercises: Supine   Bridges 15 reps    Straight Leg Raises 15 reps      Manual Therapy   Manual Traction long leg distraction bil LE for lumbar pump x 1  min ea;                  PT Education - 12/13/20 0830    Education Details reviewed HEP    Person(s)  Educated Patient    Methods Explanation;Demonstration;Verbal cues;Handout    Comprehension Verbalized understanding;Returned demonstration;Verbal cues required;Tactile cues required;Need further instruction            PT Short Term Goals - 11/30/20 2157      PT SHORT TERM GOAL #1   Title Pt to be independent with initial HEP    Time 2    Period Weeks    Status Achieved    Target Date 12/10/20             PT Long Term Goals - 11/30/20 2157      PT LONG TERM GOAL #1   Title Pt to be indepedent with final HEP for back and LEs    Time 6    Period Weeks    Status New    Target Date 01/07/21      PT LONG TERM GOAL #2   Title Pt to demo improved strength of bil hips and knees, to 5/5 to improve stability and gait.    Time 6    Period Weeks    Status New    Target Date 01/07/21      PT LONG TERM GOAL #3   Title Pt to demo static and dynamic standing balance to be rated at least good, to reduce fall risk and improve safety with functional activities .    Time 6    Period Weeks    Target Date 01/07/21      PT LONG TERM GOAL #4   Title Pt to demo ability for safe navigation with RW at curb and to car, to improve safety and fall risk.    Time 6    Period Weeks    Status New    Target Date 01/07/21                 Plan - 12/13/20 8563    Clinical Impression Statement Pt able to stand for 2 min today, but requires max verbal cueing and re-assurance to do so, without UE support. Pt with minimal change in distal weakness. Plan to progress standing stability and balance as tolerated.    Personal Factors and Comorbidities Comorbidity 1    Comorbidities stenosis    Examination-Activity Limitations Locomotion Level;Bend;Squat;Stairs;Stand    Examination-Participation Restrictions Cleaning;Community Activity;Shop;Yard Work    [employer]chant [employer] officer Evolving/Moderate complexity    Rehab Potential Good    PT Frequency 2x / week    PT Duration 6 weeks  PT  Treatment/Interventions ADLs/Self Care Home Management;Cryotherapy;Electrical Stimulation;Gait training;DME Instruction;Ultrasound;Traction;Moist Heat;Stair training;Functional mobility training;Iontophoresis 66m/ml Dexamethasone;Therapeutic activities;Therapeutic exercise;Balance training;Neuromuscular re-education;Manual techniques;Orthotic Fit/Training;Patient/family education;Passive range of motion;Wheelchair mobility training;Dry needling;Taping;Joint Manipulations;Spinal Manipulations;Visual/perceptual remediation/compensation;Energy conservation    Consulted and Agree with Plan of Care Patient           Patient will benefit from skilled therapeutic intervention in order to improve the following deficits and impairments:  Abnormal gait,Decreased coordination,Difficulty walking,Decreased safety awareness,Decreased endurance,Decreased activity tolerance,Pain,Impaired vision/preception,Decreased balance,Decreased strength,Decreased mobility,Impaired flexibility,Decreased range of motion,Decreased knowledge of precautions  Visit Diagnosis: Muscle weakness (generalized)  Other abnormalities of gait and mobility     Problem List Patient Active Problem List   Diagnosis Date Noted  . Actinic keratosis 06/16/2020  . Bilateral leg weakness 06/16/2020  . Onychomycosis 12/13/2019  . Herniated lumbar intervertebral disc 08/16/2019  . 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 Acute right ankle pain 07/19/2018  . TSH elevation 02/25/2016  . Orthostatic hypotension 02/17/2016  . Syncope 01/07/2016  . 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 Bifascicular block 08/20/2015  . [place]urop[place] 02/05/2015  . Diverticulosis of colon   . Vitamin D deficiency   . Hyperglycemia   . Dysphagia   . Diverticulum of esophagus, acquired   . Hyperlipidemia   . Glaucoma   . BPH (benign prostatic hyperplasia)   . Essential hypertension 02/01/2013  . Impotence  sexual 01/30/2013    LLyndee Hensen PT, DPT 8:38 AM  12/13/20    [facility] [place] [address]Phone: [phone]  Fax:  [phone] Name: Kirk PARLATOMRN: [id number] of Birth: [birth year]

## 2020-12-16 ENCOUNTER — Other Ambulatory Visit: Payer: Self-pay

## 2020-12-16 ENCOUNTER — Ambulatory Visit: Payer: PPO | Admitting: Physical Therapy

## 2020-12-16 DIAGNOSIS — M6281 Muscle weakness (generalized): Secondary | ICD-10-CM | POA: Diagnosis not present

## 2020-12-16 DIAGNOSIS — R2689 Other abnormalities of gait and mobility: Secondary | ICD-10-CM | POA: Diagnosis not present

## 2020-12-17 ENCOUNTER — Encounter: Payer: Self-pay | Admitting: Physical Therapy

## 2020-12-17 NOTE — Therapy (Signed)
[facility] [address] [place], [place], [address] Phone: [phone]   Fax:  [phone]  Physical Therapy Treatment  Patient Details  Name: Kirk [name] MRN: [id number] Date of Birth: [birth year] Referring Provider (PT): Nelva Bush   Encounter Date: 12/16/2020   PT End of Session - 12/17/20 0831    Visit Number 6    Number of Visits 8    Date for PT Re-Evaluation 01/07/21    Authorization Type HTA    PT Start Time 1602    PT Stop Time 5027    PT Time Calculation (min) 45 min    Equipment Utilized During Treatment Gait belt    Activity Tolerance Patient tolerated treatment well    Behavior During Therapy [facility] for tasks assessed/performed           Past Medical History:  Diagnosis Date  . Cataract   . Congenital glaucoma    "both eyes operated on when I was 59 months old"  . Cough   . Disturbance of skin sensation   . Diverticulosis of colon 2014  . Diverticulum of esophagus, acquired   . Dysphagia, unspecified(787.20)   . Encounter for long-term (current) use of other medications   . Herpes zoster without mention of complication   . Hyperglycemia    patient denies  . Hyperlipidemia   . Hypertension   . 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 Lumbago   . Nevus, non-neoplastic   . Osteoarthrosis, unspecified whether generalized or localized, unspecified site    pt. denies  . Other premature beats   . Pain in limb   . Plantar fascial fibromatosis   . Special screening for malignant neoplasm of prostate   . Unspecified glaucoma(365.9)   . Unspecified pruritic disorder   . Unspecified vitamin D deficiency   . Urinary frequency   . Zenker's diverticulum     Past Surgical History:  Procedure Laterality Date  . CATARACT EXTRACTION BILATERAL W/ ANTERIOR VITRECTOMY Bilateral 1977  . COLONOSCOPY  09/06/2013   [name]  . DIRECT LARYNGOSCOPY  10/22/2015   Cervical  esophagoscopy. Endoscopic esophageal diverticulotomy.   Marland Kitchen [place]  . GLAUCOMA SURGERY  1945   congenital glaucoma  . HERNIA REPAIR  1993  . [place]  2008  . LARYNGOSCOPY     with stapling of zenkers diverticulum  . LUMBAR LAMINECTOMY/ DECOMPRESSION WITH MET-RX Left 08/16/2019   Procedure: Left Lumbar Two-Three Minimally Invasive Laminectomy and Microdiscectomy;  Surgeon: Judith Part, MD;  Location: [facility];  Service: Neurosurgery;  Laterality: Left;  Left Lumbar 2-3 Minimally invasive laminectomy and microdiscectomy    There were no vitals filed for this visit.   Subjective Assessment - 12/17/20 0828    Subjective Pt with no new complaints.    Currently in Pain? No/denies    Pain Score 0-No pain                             [facility] Adult PT Treatment/Exercise - 12/17/20 0001      Knee/Hip Exercises: Stretches   Other Knee/Hip Stretches Manual hip flexor stretching off side of table x 2 min bil;    Other Knee/Hip Stretches SKTC 30 sec x 3 bil;      Knee/Hip Exercises: Standing   Hip Flexion 20 reps;Knee bent    Forward Step Up 10 reps;Both;Hand Hold: 1;Step Height:  6"    Other Standing Knee Exercises mini squats x15;    Other Standing Knee Exercises therapist assist, 1 hand  L/R and staggered stance weight shifts x 20; Standing x 2 min without UE support with close supervision; Side stepping in front of mat table 5 ft x 6; modified tandem stance      Knee/Hip Exercises: Seated   Other Seated Knee/Hip Exercises Ankle PF with slow eccentric release RTB x 20 bil (review for HEP)    Other Seated Knee/Hip Exercises Heel raises x 20;    Sit to Sand 10 reps;with UE support   minimal UE support     Knee/Hip Exercises: Supine   Bridges 20 reps                  PT Education - 12/17/20 0831    Education Details Reviewed HEP, Dx, expected outcomes, and reviewed questions to ask the dr at f/u.    Person(s) Educated  Patient    Methods Explanation;Demonstration;Verbal cues;Tactile cues    Comprehension Verbalized understanding;Returned demonstration;Verbal cues required;Tactile cues required            PT Short Term Goals - 11/30/20 2157      PT SHORT TERM GOAL #1   Title Pt to be independent with initial HEP    Time 2    Period Weeks    Status Achieved    Target Date 12/10/20             PT Long Term Goals - 11/30/20 2157      PT LONG TERM GOAL #1   Title Pt to be indepedent with final HEP for back and LEs    Time 6    Period Weeks    Status New    Target Date 01/07/21      PT LONG TERM GOAL #2   Title Pt to demo improved strength of bil hips and knees, to 5/5 to improve stability and gait.    Time 6    Period Weeks    Status New    Target Date 01/07/21      PT LONG TERM GOAL #3   Title Pt to demo static and dynamic standing balance to be rated at least good, to reduce fall risk and improve safety with functional activities .    Time 6    Period Weeks    Target Date 01/07/21      PT LONG TERM GOAL #4   Title Pt to demo ability for safe navigation with RW at curb and to car, to improve safety and fall risk.    Time 6    Period Weeks    Status New    Target Date 01/07/21                 Plan - 12/17/20 0834    Clinical Impression Statement Pt continues to have many questions on his current state. Discussed diagnosis, weakness, and fact that there has been little change in the distal LE weakness. Also discussed questions to ask Dr at next f/u on Friday. Pt with good awarenesss of his deficits functionally, and continues to use UE support for safety. He has limited capacity for standing activity without UE support due to LE weakness, and is challenged with standing stability exercises today. Plan to see pt for 1 more week, and progress to d/c to HEP. Pt continues to require mod-max cueing to perform exercises correctly for optimal benefit.    Personal  Factors and  Comorbidities Comorbidity 1    Comorbidities stenosis    Examination-Activity Limitations Locomotion Level;Bend;Squat;Stairs;Stand    Examination-Participation Restrictions Cleaning;Community Activity;Shop;Yard Work    [employer]chant [employer] officer Evolving/Moderate complexity    Rehab Potential Good    PT Frequency 2x / week    PT Duration 6 weeks    PT Treatment/Interventions ADLs/Self Care Home Management;Cryotherapy;Electrical Stimulation;Gait training;DME Instruction;Ultrasound;Traction;Moist Heat;Stair training;Functional mobility training;Iontophoresis 73m/ml Dexamethasone;Therapeutic activities;Therapeutic exercise;Balance training;Neuromuscular re-education;Manual techniques;Orthotic Fit/Training;Patient/family education;Passive range of motion;Wheelchair mobility training;Dry needling;Taping;Joint Manipulations;Spinal Manipulations;Visual/perceptual remediation/compensation;Energy conservation    Consulted and Agree with Plan of Care Patient           Patient will benefit from skilled therapeutic intervention in order to improve the following deficits and impairments:  Abnormal gait,Decreased coordination,Difficulty walking,Decreased safety awareness,Decreased endurance,Decreased activity tolerance,Pain,Impaired vision/preception,Decreased balance,Decreased strength,Decreased mobility,Impaired flexibility,Decreased range of motion,Decreased knowledge of precautions  Visit Diagnosis: Muscle weakness (generalized)  Other abnormalities of gait and mobility     Problem List Patient Active Problem List   Diagnosis Date Noted  . Actinic keratosis 06/16/2020  . Bilateral leg weakness 06/16/2020  . Onychomycosis 12/13/2019  . Herniated lumbar intervertebral disc 08/16/2019  . 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 Acute right ankle pain 07/19/2018  . TSH elevation 02/25/2016  . Orthostatic hypotension 02/17/2016  . Syncope 01/07/2016  . Esophageal diverticulum,  acquired 10/22/2015  . 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 Bifascicular block 08/20/2015  . Neuropathy ([place] 02/05/2015  . Diverticulosis of colon   . Vitamin D deficiency   . Hyperglycemia   . Dysphagia   . Diverticulum of esophagus, acquired   . Hyperlipidemia   . Glaucoma   . BPH (benign prostatic hyperplasia)   . Essential hypertension 02/01/2013  . 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    LLyndee Hensen PT, DPT 8:39 AM  12/17/20    [facility] [address] [address]Phone: [phone]  Fax:  [phone] Name: Kirk [name]: [id number] of Birth: 3Sep 05, 1942

## 2020-12-18 ENCOUNTER — Ambulatory Visit: Payer: PPO | Admitting: Physical Therapy

## 2020-12-18 DIAGNOSIS — R2689 Other abnormalities of gait and mobility: Secondary | ICD-10-CM

## 2020-12-18 DIAGNOSIS — M6281 Muscle weakness (generalized): Secondary | ICD-10-CM | POA: Diagnosis not present

## 2020-12-19 DIAGNOSIS — M6281 Muscle weakness (generalized): Secondary | ICD-10-CM | POA: Diagnosis not present

## 2020-12-20 ENCOUNTER — Encounter: Payer: Self-pay | Admitting: Physical Therapy

## 2020-12-20 NOTE — Therapy (Signed)
[facility] [address] [place], [place], [address] Phone: [phone]   Fax:  [phone]  Physical Therapy Treatment  Patient Details  Name: Kirk [name] MRN: [id number] Date of Birth: Dec 31, 1940 Referring Provider (PT): Nelva Bush   Encounter Date: 12/18/2020   PT End of Session - 12/20/20 2222    Visit Number 7    Number of Visits 8    Date for PT Re-Evaluation 01/07/21    Authorization Type HTA    PT Start Time 1602    PT Stop Time 1640    PT Time Calculation (min) 38 min    Equipment Utilized During Treatment Gait belt    Activity Tolerance Patient tolerated treatment well    Behavior During Therapy [facility] for tasks assessed/performed           Past Medical History:  Diagnosis Date  . Cataract   . Congenital glaucoma    "both eyes operated on when I was 16 months old"  . Cough   . Disturbance of skin sensation   . Diverticulosis of colon 2014  . Diverticulum of esophagus, acquired   . Dysphagia, unspecified(787.20)   . Encounter for long-term (current) use of other medications   . Herpes zoster without mention of complication   . Hyperglycemia    patient denies  . Hyperlipidemia   . Hypertension   . 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 Lumbago   . Nevus, non-neoplastic   . Osteoarthrosis, unspecified whether generalized or localized, unspecified site    pt. denies  . Other premature beats   . Pain in limb   . Plantar fascial fibromatosis   . Special screening for malignant neoplasm of prostate   . Unspecified glaucoma(365.9)   . Unspecified pruritic disorder   . Unspecified vitamin D deficiency   . Urinary frequency   . Zenker's diverticulum     Past Surgical History:  Procedure Laterality Date  . CATARACT EXTRACTION BILATERAL W/ ANTERIOR VITRECTOMY Bilateral 1977  . COLONOSCOPY  09/06/2013   [name]  . DIRECT LARYNGOSCOPY  10/22/2015   Cervical  esophagoscopy. Endoscopic esophageal diverticulotomy.   Marland Kitchen [place]  . GLAUCOMA SURGERY  1945   congenital glaucoma  . HERNIA REPAIR  1993  . [place]  2008  . LARYNGOSCOPY     with stapling of zenkers diverticulum  . LUMBAR LAMINECTOMY/ DECOMPRESSION WITH MET-RX Left 08/16/2019   Procedure: Left Lumbar Two-Three Minimally Invasive Laminectomy and Microdiscectomy;  Surgeon: Judith Part, MD;  Location: [facility];  Service: Neurosurgery;  Laterality: Left;  Left Lumbar 2-3 Minimally invasive laminectomy and microdiscectomy    There were no vitals filed for this visit.   Subjective Assessment - 12/20/20 2220    Subjective Pt seeing MD later this week.    Currently in Pain? No/denies                             [facility] Adult PT Treatment/Exercise - 12/20/20 0001      Ambulation/Gait   Gait Comments 35 ft x 6 education on upright posture and not leaning onto walker, practiced up/down outdoor curb x3 with RW, as well as car transfer, and safe review of folding and putting RW in trunk.      Knee/Hip Exercises: Stretches   Active Hamstring Stretch 4 reps;30 seconds    Active Hamstring Stretch Limitations seated  Other Knee/Hip Stretches hip flexor stretching off side of table x 2 min bil;    Other Knee/Hip Stretches SKTC 30 sec x 3 bil;      Knee/Hip Exercises: Standing   Hip Flexion 20 reps;Knee bent    Forward Step Up 10 reps;Both;Hand Hold: 1;Step Height: 6"    Other Standing Knee Exercises mini squats x15;    Other Standing Knee Exercises therapist assist, 1 hand  L/R and staggered stance weight shifts x 20; Standing x 2 min without UE support with close supervision; Side stepping at counter 10 ft x 6; ; modified tandem stance      Knee/Hip Exercises: Seated   Other Seated Knee/Hip Exercises Heel raises x 20;    Sit to Sand 10 reps;with UE support   minimal UE support     Knee/Hip Exercises: Supine   Bridges 20 reps                   PT Education - 12/20/20 2222    Education Details reviewed HEP    Person(s) Educated Patient    Methods Explanation;Demonstration;Tactile cues;Verbal cues    Comprehension Verbalized understanding;Returned demonstration;Verbal cues required;Tactile cues required            PT Short Term Goals - 11/30/20 2157      PT SHORT TERM GOAL #1   Title Pt to be independent with initial HEP    Time 2    Period Weeks    Status Achieved    Target Date 12/10/20             PT Long Term Goals - 11/30/20 2157      PT LONG TERM GOAL #1   Title Pt to be indepedent with final HEP for back and LEs    Time 6    Period Weeks    Status New    Target Date 01/07/21      PT LONG TERM GOAL #2   Title Pt to demo improved strength of bil hips and knees, to 5/5 to improve stability and gait.    Time 6    Period Weeks    Status New    Target Date 01/07/21      PT LONG TERM GOAL #3   Title Pt to demo static and dynamic standing balance to be rated at least good, to reduce fall risk and improve safety with functional activities .    Time 6    Period Weeks    Target Date 01/07/21      PT LONG TERM GOAL #4   Title Pt to demo ability for safe navigation with RW at curb and to car, to improve safety and fall risk.    Time 6    Period Weeks    Status New    Target Date 01/07/21                 Plan - 12/20/20 2224    Clinical Impression Statement Practice for safe curb navigation with walker, pt with tendency to keep walker too far away and req max cuing for putting brakes on walker. Also practiced safe way of getting walker folded and into trunk, and safe transfer into car, with sitting down on seat first. Pt continues to be very fearful of standing without walker, due to weakness and instability in legs.    Personal Factors and Comorbidities Comorbidity 1    Comorbidities stenosis    Examination-Activity Limitations Locomotion Level;Bend;Squat;Stairs;Stand  Examination-Participation Restrictions Cleaning;Community Activity;Shop;Yard Work    [employer]chant [employer] officer Evolving/Moderate complexity    Rehab Potential Good    PT Frequency 2x / week    PT Duration 6 weeks    PT Treatment/Interventions ADLs/Self Care Home Management;Cryotherapy;Electrical Stimulation;Gait training;DME Instruction;Ultrasound;Traction;Moist Heat;Stair training;Functional mobility training;Iontophoresis 85m/ml Dexamethasone;Therapeutic activities;Therapeutic exercise;Balance training;Neuromuscular re-education;Manual techniques;Orthotic Fit/Training;Patient/family education;Passive range of motion;Wheelchair mobility training;Dry needling;Taping;Joint Manipulations;Spinal Manipulations;Visual/perceptual remediation/compensation;Energy conservation    Consulted and Agree with Plan of Care Patient           Patient will benefit from skilled therapeutic intervention in order to improve the following deficits and impairments:  Abnormal gait,Decreased coordination,Difficulty walking,Decreased safety awareness,Decreased endurance,Decreased activity tolerance,Pain,Impaired vision/preception,Decreased balance,Decreased strength,Decreased mobility,Impaired flexibility,Decreased range of motion,Decreased knowledge of precautions  Visit Diagnosis: Muscle weakness (generalized)  Other abnormalities of gait and mobility     Problem List Patient Active Problem List   Diagnosis Date Noted  . Actinic keratosis 06/16/2020  . Bilateral leg weakness 06/16/2020  . Onychomycosis 12/13/2019  . Herniated lumbar intervertebral disc 08/16/2019  . 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 Acute right ankle pain 07/19/2018  . TSH elevation 02/25/2016  . Orthostatic hypotension 02/17/2016  . Syncope 01/07/2016  . 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 Bifascicular block 08/20/2015  . Neuropathy ([place] 02/05/2015  . Diverticulosis of  colon   . Vitamin D deficiency   . Hyperglycemia   . Dysphagia   . Diverticulum of esophagus, acquired   . Hyperlipidemia   . Glaucoma   . BPH (benign prostatic hyperplasia)   . Essential hypertension 02/01/2013  . 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    LLyndee Hensen PT, DPT 10:26 PM  12/20/20    [place] [address]Phone: [phone]  Fax:  [phone] Name: JNOLAN TUAZONMRN: [id number] of Birth: [birth year]

## 2020-12-23 ENCOUNTER — Ambulatory Visit: Payer: PPO | Admitting: Physical Therapy

## 2020-12-23 ENCOUNTER — Encounter: Payer: Self-pay | Admitting: Physical Therapy

## 2020-12-23 ENCOUNTER — Other Ambulatory Visit: Payer: Self-pay

## 2020-12-23 DIAGNOSIS — M6281 Muscle weakness (generalized): Secondary | ICD-10-CM

## 2020-12-23 DIAGNOSIS — R2689 Other abnormalities of gait and mobility: Secondary | ICD-10-CM | POA: Diagnosis not present

## 2020-12-25 ENCOUNTER — Other Ambulatory Visit: Payer: Self-pay

## 2020-12-25 ENCOUNTER — Ambulatory Visit (INDEPENDENT_AMBULATORY_CARE_PROVIDER_SITE_OTHER): Payer: PPO | Admitting: Physical Therapy

## 2020-12-25 ENCOUNTER — Encounter: Payer: Self-pay | Admitting: Physical Therapy

## 2020-12-25 DIAGNOSIS — M6281 Muscle weakness (generalized): Secondary | ICD-10-CM

## 2020-12-25 DIAGNOSIS — R2689 Other abnormalities of gait and mobility: Secondary | ICD-10-CM | POA: Diagnosis not present

## 2020-12-25 NOTE — Therapy (Signed)
[facility] [address] [place], [place], [address] Phone: [phone]   Fax:  [phone]  Physical Therapy Treatment/Discharge   Patient Details  Name: Kirk [name] MRN: [id number] Date of Birth: 1940-12-14 Referring Provider (PT): Nelva Bush   Encounter Date: 12/25/2020   PT End of Session - 12/25/20 1646    Visit Number 9    Number of Visits    Date for PT Re-Evaluation 01/07/21    Authorization Type HTA    PT Start Time 1601    PT Stop Time 1640    PT Time Calculation (min) 39 min    Equipment Utilized During Treatment Gait belt    Activity Tolerance Patient tolerated treatment well    Behavior During Therapy [facility] for tasks assessed/performed           Past Medical History:  Diagnosis Date  . Cataract   . Congenital glaucoma    "both eyes operated on when I was 46 months old"  . Cough   . Disturbance of skin sensation   . Diverticulosis of colon 2014  . Diverticulum of esophagus, acquired   . Dysphagia, unspecified(787.20)   . Encounter for long-term (current) use of other medications   . Herpes zoster without mention of complication   . Hyperglycemia    patient denies  . Hyperlipidemia   . Hypertension   . 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 Lumbago   . Nevus, non-neoplastic   . Osteoarthrosis, unspecified whether generalized or localized, unspecified site    pt. denies  . Other premature beats   . Pain in limb   . Plantar fascial fibromatosis   . Special screening for malignant neoplasm of prostate   . Unspecified glaucoma(365.9)   . Unspecified pruritic disorder   . Unspecified vitamin D deficiency   . Urinary frequency   . Zenker's diverticulum     Past Surgical History:  Procedure Laterality Date  . CATARACT EXTRACTION BILATERAL W/ ANTERIOR VITRECTOMY Bilateral 1977  . COLONOSCOPY  09/06/2013   [name]  . DIRECT LARYNGOSCOPY  10/22/2015    Cervical esophagoscopy. Endoscopic esophageal diverticulotomy.   Marland Kitchen [place]  . GLAUCOMA SURGERY  1945   congenital glaucoma  . HERNIA REPAIR  1993  . [place]  2008  . LARYNGOSCOPY     with stapling of zenkers diverticulum  . LUMBAR LAMINECTOMY/ DECOMPRESSION WITH MET-RX Left 08/16/2019   Procedure: Left Lumbar Two-Three Minimally Invasive Laminectomy and Microdiscectomy;  Surgeon: Judith Part, MD;  Location: [facility];  Service: Neurosurgery;  Laterality: Left;  Left Lumbar 2-3 Minimally invasive laminectomy and microdiscectomy    There were no vitals filed for this visit.   Subjective Assessment - 12/25/20 1645    Subjective Pt with no new complaints.    Currently in Pain? No/denies    Pain Score 0-No pain                             [facility] Adult PT Treatment/Exercise - 12/25/20 0001      Ambulation/Gait   Gait Comments 4  min walk with RW, pt requests to sit down.      Knee/Hip Exercises: Stretches   Active Hamstring Stretch 4 reps;20 seconds    Active Hamstring Stretch Limitations seated    Other Knee/Hip Stretches SKTC 30 sec x 3 bil;      Knee/Hip  Exercises: Standing   Hip Flexion 20 reps;Knee bent    Other Standing Knee Exercises mini squats x15;      Knee/Hip Exercises: Seated   Other Seated Knee/Hip Exercises reviewed for HEP    Sit to Sand 10 reps;with UE support   minimal UE support     Knee/Hip Exercises: Supine   Bridges 20 reps    Other Supine Knee/Hip Exercises supine march x 20;                  PT Education - 12/25/20 1646    Education Details Discussed current deficits, and importance of HEP at length with pt and wife. Discussed need for continued use of RW for UE support and safety    Person(s) Educated Patient    Methods Explanation;Demonstration;Handout;Verbal cues    Comprehension Verbalized understanding;Returned demonstration;Verbal cues required            PT Short Term  Goals - 11/30/20 2157      PT SHORT TERM GOAL #1   Title Pt to be independent with initial HEP    Time 2    Period Weeks    Status Achieved    Target Date 12/10/20             PT Long Term Goals - 12/25/20 1647      PT LONG TERM GOAL #1   Title Pt to be indepedent with final HEP for back and LEs    Time 6    Period Weeks    Status Achieved      PT LONG TERM GOAL #2   Title Pt to demo improved strength of bil hips and knees, to 5/5 to improve stability and gait.    Time 6    Period Weeks    Status Partially Met      PT LONG TERM GOAL #3   Title Pt to demo static and dynamic standing balance to be rated at least good, to reduce fall risk and improve safety with functional activities .    Baseline * met , if pt has UE support    Time 6    Period Weeks    Status Partially Met      PT LONG TERM GOAL #4   Title Pt to demo ability for safe navigation with RW at curb and to car, to improve safety and fall risk.    Time 6    Period Weeks    Status Achieved                 Plan - 12/25/20 1648    Clinical Impression Statement Pt has been seen for 9 visits. He continues to have significant weakness in distal LEs that effect moblity and safety. Pt with good strength of proximal muscles, and with hips/knees, but significat weakness in plantar flexion and inv/ev in ankles. He has limited ability for standing without UE support, up to 2 min, before he feels that legs are going to buckle. This sensation as well as the weakness in distal muscles has not changed significantly with this (or previous)  course of PT. Pt with good safety awareness and safe ability for functional activity with use of UE support and RW. He has not demonstrated loss of balance or LEs giving way during sessions, and has had no falls at home. Discussed importance of continuing HEP for moblity and strength with pt and wife. Pt will be discharged today, pt in agreement with plan.  Personal Factors and  Comorbidities Comorbidity 1    Comorbidities stenosis    Examination-Activity Limitations Locomotion Level;Bend;Squat;Stairs;Stand    Examination-Participation Restrictions Cleaning;Community Activity;Shop;Yard Work    [employer]chant [employer] officer Evolving/Moderate complexity    Rehab Potential Good    PT Frequency 2x / week    PT Duration 6 weeks    PT Treatment/Interventions ADLs/Self Care Home Management;Cryotherapy;Electrical Stimulation;Gait training;DME Instruction;Ultrasound;Traction;Moist Heat;Stair training;Functional mobility training;Iontophoresis 7m/ml Dexamethasone;Therapeutic activities;Therapeutic exercise;Balance training;Neuromuscular re-education;Manual techniques;Orthotic Fit/Training;Patient/family education;Passive range of motion;Wheelchair mobility training;Dry needling;Taping;Joint Manipulations;Spinal Manipulations;Visual/perceptual remediation/compensation;Energy conservation    Consulted and Agree with Plan of Care Patient           Patient will benefit from skilled therapeutic intervention in order to improve the following deficits and impairments:  Abnormal gait,Decreased coordination,Difficulty walking,Decreased safety awareness,Decreased endurance,Decreased activity tolerance,Pain,Impaired vision/preception,Decreased balance,Decreased strength,Decreased mobility,Impaired flexibility,Decreased range of motion,Decreased knowledge of precautions  Visit Diagnosis: Muscle weakness (generalized)  Other abnormalities of gait and mobility     Problem List Patient Active Problem List   Diagnosis Date Noted  . Actinic keratosis 06/16/2020  . Bilateral leg weakness 06/16/2020  . Onychomycosis 12/13/2019  . Herniated lumbar intervertebral disc 08/16/2019  . 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 Acute right ankle pain 07/19/2018  . TSH elevation 02/25/2016  . Orthostatic hypotension 02/17/2016  . Syncope 01/07/2016  . Esophageal diverticulum,  acquired 10/22/2015  . 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 Bifascicular block 08/20/2015  . Neuropathy ([place] 02/05/2015  . Diverticulosis of colon   . Vitamin D deficiency   . Hyperglycemia   . Dysphagia   . Diverticulum of esophagus, acquired   . Hyperlipidemia   . Glaucoma   . BPH (benign prostatic hyperplasia)   . Essential hypertension 02/01/2013  . 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    LLyndee Hensen PT, DPT 4:54 PM  12/25/20    [facility] [address] [address]Phone: [phone]  Fax:  [phone] Name: Kirk CLARDYMRN: [id number] of Birth: 308/23/1942   PHYSICAL THERAPY DISCHARGE SUMMARY  Visits from start of care: 9   Plan: Patient agrees to discharge.  Patient goals were partially met. Patient is being discharged due to lack of progress.  ?????    LLyndee Hensen PT, DPT 4:55 PM  12/25/20

## 2020-12-25 NOTE — Patient Instructions (Signed)
Access Code: [email or web address] URL: [email or web address] Date: 12/25/2020 Prepared by: Lyndee Hensen  Exercises Seated Hamstring Stretch - 2 x daily - 3 reps - 30 hold Single Knee to Chest Stretch - 2 x daily - 3 reps - 30 hold Standing Hip Abduction - 1 x daily - 10 reps - 1-2 sets Standing Marching - 1 x daily - 2 sets - 10 reps Seated Knee Extension AROM - 1 x daily - 10 reps - 2 sets Straight Leg Raise - 1 x daily - 10 reps - 1 sets Supine Bridge - 1 x daily - 2 sets - 10 reps Seated Heel Raise - 2 x daily - 2 sets - 10 reps Seated Eccentric Ankle Plantar Flexion with Resistance - Straight Leg - 2 x daily - 1-2 sets - 10 reps Sit to Stand - 1 x daily - 1 sets - 10 reps

## 2020-12-25 NOTE — Therapy (Signed)
[facility] [address] [place], [place], [address] Phone: [phone]   Fax:  [phone]  Physical Therapy Treatment  Patient Details  Name: Kirk [name] MRN: [id number] Date of Birth: 12/17/1940 Referring Provider (PT): Nelva Bush   Encounter Date: 12/23/2020   PT End of Session - 12/25/20 1701    Visit Number 8    Number of Visits 8    Date for PT Re-Evaluation 01/07/21    Authorization Type HTA    PT Start Time 1604    PT Stop Time 1645    PT Time Calculation (min) 41 min    Equipment Utilized During Treatment Gait belt    Activity Tolerance Patient tolerated treatment well    Behavior During Therapy [facility] for tasks assessed/performed           Past Medical History:  Diagnosis Date  . Cataract   . Congenital glaucoma    "both eyes operated on when I was 6 months old"  . Cough   . Disturbance of skin sensation   . Diverticulosis of colon 2014  . Diverticulum of esophagus, acquired   . Dysphagia, unspecified(787.20)   . Encounter for long-term (current) use of other medications   . Herpes zoster without mention of complication   . Hyperglycemia    patient denies  . Hyperlipidemia   . Hypertension   . 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 Lumbago   . Nevus, non-neoplastic   . Osteoarthrosis, unspecified whether generalized or localized, unspecified site    pt. denies  . Other premature beats   . Pain in limb   . Plantar fascial fibromatosis   . Special screening for malignant neoplasm of prostate   . Unspecified glaucoma(365.9)   . Unspecified pruritic disorder   . Unspecified vitamin D deficiency   . Urinary frequency   . Zenker's diverticulum     Past Surgical History:  Procedure Laterality Date  . CATARACT EXTRACTION BILATERAL W/ ANTERIOR VITRECTOMY Bilateral 1977  . COLONOSCOPY  09/06/2013   [name]  . DIRECT LARYNGOSCOPY  10/22/2015   Cervical  esophagoscopy. Endoscopic esophageal diverticulotomy.   Marland Kitchen [place]  . GLAUCOMA SURGERY  1945   congenital glaucoma  . HERNIA REPAIR  1993  . [place]  2008  . LARYNGOSCOPY     with stapling of zenkers diverticulum  . LUMBAR LAMINECTOMY/ DECOMPRESSION WITH MET-RX Left 08/16/2019   Procedure: Left Lumbar Two-Three Minimally Invasive Laminectomy and Microdiscectomy;  Surgeon: Judith Part, MD;  Location: [facility];  Service: Neurosurgery;  Laterality: Left;  Left Lumbar 2-3 Minimally invasive laminectomy and microdiscectomy    There were no vitals filed for this visit.   Subjective Assessment - 12/25/20 1701    Subjective Pt saw MD, will go back in 2 weeks for f/u for lab work.    Currently in Pain? No/denies    Pain Score 0-No pain                             [facility] Adult PT Treatment/Exercise - 12/25/20 1700      Ambulation/Gait   Gait Comments 35 ft x 8 education on upright posture and not leaning onto walker,      Knee/Hip Exercises: Stretches   Active Hamstring Stretch 4 reps;30 seconds    Active Hamstring Stretch Limitations seated    Other Knee/Hip Stretches hip  flexor stretching off side of table x 2 min bil;    Other Knee/Hip Stretches SKTC 30 sec x 3 bil;      Knee/Hip Exercises: Standing   Hip Flexion 20 reps;Knee bent    Forward Step Up 10 reps;Both;Hand Hold: 1;Step Height: 6"    Other Standing Knee Exercises mini squats x15;    Other Standing Knee Exercises therapist assist, 1 hand  L/R and staggered stance weight shifts x 20; Standing x 2 min without UE support with close supervision;      Knee/Hip Exercises: Seated   Other Seated Knee/Hip Exercises Heel raises x 20;    Sit to Sand 10 reps;with UE support   minimal UE support     Knee/Hip Exercises: Supine   Bridges 20 reps                    PT Short Term Goals - 11/30/20 2157      PT SHORT TERM GOAL #1   Title Pt to be independent with  initial HEP    Time 2    Period Weeks    Status Achieved    Target Date 12/10/20             PT Long Term Goals - 12/25/20 1647      PT LONG TERM GOAL #1   Title Pt to be indepedent with final HEP for back and LEs    Time 6    Period Weeks    Status      PT LONG TERM GOAL #2   Title Pt to demo improved strength of bil hips and knees, to 5/5 to improve stability and gait.    Time 6    Period Weeks          PT LONG TERM GOAL #3   Title Pt to demo static and dynamic standing balance to be rated at least good, to reduce fall risk and improve safety with functional activities .    Baseline    Time 6    Period Weeks    Status P      PT LONG TERM GOAL #4   Title Pt to demo ability for safe navigation with RW at curb and to car, to improve safety and fall risk.    Time 6     weeks                    Plan - 12/25/20 1702    Clinical Impression Statement Pt continues to challenged with stability exercises with less UE support. Pt very hesitant to perform due to weakness in legs. Pt continues to require max cuing for ther ex to perform with optimal mechanics. likely d/c next visit. Pt will f/u with MD as directed.    Personal Factors and Comorbidities Comorbidity 1    Comorbidities stenosis    Examination-Activity Limitations Locomotion Level;Bend;Squat;Stairs;Stand    Examination-Participation Restrictions Cleaning;Community Activity;Shop;Yard Work    Merchant [employer] officer Evolving/Moderate complexity    Rehab Potential Good    PT Frequency 2x / week    PT Duration 6 weeks    PT Treatment/Interventions ADLs/Self Care Home Management;Cryotherapy;Electrical Stimulation;Gait training;DME Instruction;Ultrasound;Traction;Moist Heat;Stair training;Functional mobility training;Iontophoresis 79m/ml Dexamethasone;Therapeutic activities;Therapeutic exercise;Balance training;Neuromuscular re-education;Manual techniques;Orthotic Fit/Training;Patient/family  education;Passive range of motion;Wheelchair mobility training;Dry needling;Taping;Joint Manipulations;Spinal Manipulations;Visual/perceptual remediation/compensation;Energy conservation    Consulted and Agree with Plan of Care Patient           Patient will benefit from skilled therapeutic intervention  in order to improve the following deficits and impairments:  Abnormal gait,Decreased coordination,Difficulty walking,Decreased safety awareness,Decreased endurance,Decreased activity tolerance,Pain,Impaired vision/preception,Decreased balance,Decreased strength,Decreased mobility,Impaired flexibility,Decreased range of motion,Decreased knowledge of precautions  Visit Diagnosis: Muscle weakness (generalized)  Other abnormalities of gait and mobility     Problem List Patient Active Problem List   Diagnosis Date Noted  . Actinic keratosis 06/16/2020  . Bilateral leg weakness 06/16/2020  . Onychomycosis 12/13/2019  . Herniated lumbar intervertebral disc 08/16/2019  . 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 Acute right ankle pain 07/19/2018  . TSH elevation 02/25/2016  . Orthostatic hypotension 02/17/2016  . Syncope 01/07/2016  . 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 Bifascicular block 08/20/2015  . Neuropathy ([place]) 02/05/2015  . Diverticulosis of colon   . Vitamin D deficiency   . Hyperglycemia   . Dysphagia   . Diverticulum of esophagus, acquired   . Hyperlipidemia   . Glaucoma   . BPH (benign prostatic hyperplasia)   . Essential hypertension 02/01/2013  . 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    Lyndee Hensen, PT, DPT 5:05 PM  12/25/20    [facility] [place], [place], [address] Phone: [phone]   Fax:  [phone]  Name: SHAHEEN MENDE MRN: [id number] Date of Birth: Nov 10, 1940

## 2020-12-30 ENCOUNTER — Telehealth: Payer: Self-pay | Admitting: Family Medicine

## 2020-12-30 NOTE — Telephone Encounter (Signed)
Spoke with spouse will call back to  schedule Medicare Annual Wellness Visit (AWV) either virtually or in office.   Last AWV 05/21/2019  please schedule at anytime  This should be a 45 minute visit.

## 2021-01-02 DIAGNOSIS — R7989 Other specified abnormal findings of blood chemistry: Secondary | ICD-10-CM | POA: Diagnosis not present

## 2021-01-08 DIAGNOSIS — Z6836 Body mass index (BMI) 36.0-36.9, adult: Secondary | ICD-10-CM | POA: Diagnosis not present

## 2021-01-08 DIAGNOSIS — R7303 Prediabetes: Secondary | ICD-10-CM | POA: Diagnosis not present

## 2021-01-08 DIAGNOSIS — E291 Testicular hypofunction: Secondary | ICD-10-CM | POA: Diagnosis not present

## 2021-01-08 DIAGNOSIS — R29898 Other symptoms and signs involving the musculoskeletal system: Secondary | ICD-10-CM | POA: Diagnosis not present

## 2021-01-08 DIAGNOSIS — I10 Essential (primary) hypertension: Secondary | ICD-10-CM | POA: Diagnosis not present

## 2021-01-08 DIAGNOSIS — Z9989 Dependence on other enabling machines and devices: Secondary | ICD-10-CM | POA: Diagnosis not present

## 2021-02-05 DIAGNOSIS — I10 Essential (primary) hypertension: Secondary | ICD-10-CM | POA: Diagnosis not present

## 2021-02-05 DIAGNOSIS — R29898 Other symptoms and signs involving the musculoskeletal system: Secondary | ICD-10-CM | POA: Diagnosis not present

## 2021-02-05 DIAGNOSIS — E291 Testicular hypofunction: Secondary | ICD-10-CM | POA: Diagnosis not present

## 2021-02-05 DIAGNOSIS — Z6836 Body mass index (BMI) 36.0-36.9, adult: Secondary | ICD-10-CM | POA: Diagnosis not present

## 2021-02-05 DIAGNOSIS — Z9989 Dependence on other enabling machines and devices: Secondary | ICD-10-CM | POA: Diagnosis not present

## 2021-02-05 DIAGNOSIS — R7303 Prediabetes: Secondary | ICD-10-CM | POA: Diagnosis not present

## 2021-03-13 ENCOUNTER — Other Ambulatory Visit: Payer: Self-pay | Admitting: Family Medicine

## 2021-03-13 NOTE — Telephone Encounter (Signed)
Left message for patient to call back to see if he is still using medication

## 2021-03-16 NOTE — Telephone Encounter (Signed)
Attempted to call patient but could not leave a VM. Dr. Tamala Julian would like patient to get labs: TSH, Free T3/T4 before obtaining refill of this medication. Labs can be drawn at [facility] office if more convenient for patient.

## 2021-03-18 DIAGNOSIS — H2703 Aphakia, bilateral: Secondary | ICD-10-CM | POA: Diagnosis not present

## 2021-03-18 DIAGNOSIS — H1713 Central corneal opacity, bilateral: Secondary | ICD-10-CM | POA: Diagnosis not present

## 2021-03-18 DIAGNOSIS — H402233 Chronic angle-closure glaucoma, bilateral, severe stage: Secondary | ICD-10-CM | POA: Diagnosis not present

## 2021-03-18 DIAGNOSIS — Q150 Congenital glaucoma: Secondary | ICD-10-CM | POA: Diagnosis not present

## 2021-04-28 ENCOUNTER — Encounter: Payer: Self-pay | Admitting: Family Medicine

## 2021-04-28 ENCOUNTER — Ambulatory Visit (INDEPENDENT_AMBULATORY_CARE_PROVIDER_SITE_OTHER): Payer: PPO | Admitting: Family Medicine

## 2021-04-28 ENCOUNTER — Other Ambulatory Visit: Payer: Self-pay

## 2021-04-28 VITALS — BP 131/84 | HR 91 | Temp 97.9°F | Ht 73.0 in | Wt 191.0 lb

## 2021-04-28 DIAGNOSIS — R29898 Other symptoms and signs involving the musculoskeletal system: Secondary | ICD-10-CM

## 2021-04-28 DIAGNOSIS — G1220 Motor neuron disease, unspecified: Secondary | ICD-10-CM

## 2021-04-28 DIAGNOSIS — G629 Polyneuropathy, unspecified: Secondary | ICD-10-CM | POA: Diagnosis not present

## 2021-04-28 NOTE — Patient Instructions (Signed)
It was very nice to see you today!  We will order an MRI.  We will see you back once your results are in to discuss next steps.  Take care, Dr Jerline Pain  PLEASE NOTE:  If you had any lab tests please let us know if you have not heard back within a few days. You may see your results on mychart before we have a chance to review them but we will give you a call once they are reviewed by [place]. If we ordered any referrals today, please let us know if you have not heard from their office within the next week.   Please try these tips to maintain a healthy lifestyle:  Eat at least 3 REAL meals and 1-2 snacks per day.  Aim for no more than 5 hours between eating.  If you eat breakfast, please do so within one hour of getting up.   Each meal should contain half fruits/vegetables, one quarter protein, and one quarter carbs (no bigger than a computer mouse)  Cut down on sweet beverages. This includes juice, soda, and sweet tea.   Drink at least 1 glass of water with each meal and aim for at least 8 glasses per day  Exercise at least 150 minutes every week.

## 2021-04-28 NOTE — Assessment & Plan Note (Signed)
Multifactorial.  Status post lumbar decompression about 18 months ago.  Pain is currently manageable however still has significant mount of weakness.  Given the worsening of the symptoms in addition to recent nerve conduction study findings of axonal polyneuropathy and likely lumbar nerve root impingement we will obtain imaging to rule out any signs of impingement or compression along his entire central nervous system.  Depending on results may need referral to neurology or neurosurgery.  They will follow-up with me after results of MRI come back.  Discussed reasons to return to care.

## 2021-04-28 NOTE — Assessment & Plan Note (Signed)
Secondary to neuropathy.  We will be obtaining MRI as above.

## 2021-04-28 NOTE — Progress Notes (Signed)
   Kirk [name] is a 80 y.o. male who presents today for an office visit.  Assessment/Plan:  Chronic Problems Addressed Today: Neuropathy ([address] [address]) Multifactorial.  Status post lumbar decompression about 18 months ago.  Pain is currently manageable however still has significant mount of weakness.  Given the worsening of the symptoms in addition to recent nerve conduction study findings of axonal polyneuropathy and likely lumbar nerve root impingement we will obtain imaging to rule out any signs of impingement or compression along his entire central nervous system.  Depending on results may need referral to neurology or neurosurgery.  They will follow-up with me after results of MRI come back.  Discussed reasons to return to care.  Bilateral leg weakness Secondary to neuropathy.  We will be obtaining MRI as above.     Subjective:  HPI:  Patient here for follow up of bilateral leg weakness.  Was diagnosed with lumbar spinal stenosis 2 years ago.  Underwent lumbar decompression about a year and a half ago.  Postoperatively had significant amount of pain.  Had continued bilateral leg weakness as well.  He worked with physical therapy for period time however symptoms did not improve.  Worse refer to sports medicine and was then referred for nerve conduction study which showed severe chronic sensorimotor axonal polyneuropathy as well as multilevel radiculopathy along L2-S1 nerve roots.  He was referred to orthopedics for second opinion and then was subsequently referred to PMR.  There is discussion about a spinal stimulator however patient decided against this as this pain is mostly well controlled however still had continued issues with weakness.  There was concern about low testosterone levels and it was referred to endocrinology.  Testosterone levels were normal.  Symptoms seem to be worsening.  Has trouble walking without assistance.  Cannot stand for long periods of time.  Has had some trouble holding  bowels but has not had any overt issues with bowel or bladder incontinence.       Objective:  Physical Exam: BP 131/84   Pulse 91   Temp 97.9 F (36.6 C) (Temporal)   Ht '6\' 1"'$  (1.854 m)   Wt 191 lb (86.6 kg)   SpO2 95%   BMI 25.20 kg/m   Gen: No acute distress, resting comfortably CV: Regular rate and rhythm with no murmurs appreciated Pulm: Normal work of breathing, clear to auscultation bilaterally with no crackles, wheezes, or rhonchi Neuro: Grossly normal, moves all extremities Psych: Normal affect and thought content  Time Spent: 45 minutes of total time was spent on the date of the encounter performing the following actions: chart review prior to seeing the patient including recent visit with specialists., obtaining history, performing a medically necessary exam, counseling on the treatment plan, placing orders, and documenting in our EHR.        Algis Greenhouse. Jerline Pain, MD 04/28/2021 1:38 PM

## 2021-04-29 NOTE — Addendum Note (Signed)
Addended by: Betti Cruz on: 04/29/2021 12:59 PM   Modules accepted: Orders

## 2021-05-01 ENCOUNTER — Telehealth: Payer: Self-pay

## 2021-05-01 NOTE — Telephone Encounter (Signed)
All MRIs were sent to be done at [facility]. They are not doing MRIs yet.

## 2021-05-17 ENCOUNTER — Ambulatory Visit
Admission: RE | Admit: 2021-05-17 | Discharge: 2021-05-17 | Disposition: A | Discharge status: Home / Self Care | Payer: PPO | Source: Ambulatory Visit | Attending: Family Medicine | Admitting: Family Medicine

## 2021-05-17 DIAGNOSIS — M4802 Spinal stenosis, cervical region: Secondary | ICD-10-CM | POA: Diagnosis not present

## 2021-05-17 DIAGNOSIS — M47812 Spondylosis without myelopathy or radiculopathy, cervical region: Secondary | ICD-10-CM | POA: Diagnosis not present

## 2021-05-17 DIAGNOSIS — M48061 Spinal stenosis, lumbar region without neurogenic claudication: Secondary | ICD-10-CM | POA: Diagnosis not present

## 2021-05-17 DIAGNOSIS — G1220 Motor neuron disease, unspecified: Secondary | ICD-10-CM

## 2021-05-17 DIAGNOSIS — M47817 Spondylosis without myelopathy or radiculopathy, lumbosacral region: Secondary | ICD-10-CM | POA: Diagnosis not present

## 2021-05-17 DIAGNOSIS — R531 Weakness: Secondary | ICD-10-CM | POA: Diagnosis not present

## 2021-05-17 DIAGNOSIS — M5126 Other intervertebral disc displacement, lumbar region: Secondary | ICD-10-CM | POA: Diagnosis not present

## 2021-05-17 DIAGNOSIS — I739 Peripheral vascular disease, unspecified: Secondary | ICD-10-CM | POA: Diagnosis not present

## 2021-05-17 DIAGNOSIS — M47814 Spondylosis without myelopathy or radiculopathy, thoracic region: Secondary | ICD-10-CM | POA: Diagnosis not present

## 2021-05-17 DIAGNOSIS — M50221 Other cervical disc displacement at C4-C5 level: Secondary | ICD-10-CM | POA: Diagnosis not present

## 2021-05-19 NOTE — Progress Notes (Signed)
Please inform patient of the following:  Patient has several areas of nerve impingement and spinal stenosis.  I think this is likely the cause of most of his symptoms.  He needs to get into see neurosurgery.  Please place referral if needed.

## 2021-05-26 ENCOUNTER — Ambulatory Visit (INDEPENDENT_AMBULATORY_CARE_PROVIDER_SITE_OTHER): Payer: PPO | Admitting: Family Medicine

## 2021-05-26 ENCOUNTER — Other Ambulatory Visit: Payer: Self-pay

## 2021-05-26 ENCOUNTER — Encounter: Payer: Self-pay | Admitting: Family Medicine

## 2021-05-26 VITALS — BP 152/73 | HR 78 | Temp 97.9°F | Ht 73.0 in | Wt 194.0 lb

## 2021-05-26 DIAGNOSIS — M4800 Spinal stenosis, site unspecified: Secondary | ICD-10-CM

## 2021-05-26 DIAGNOSIS — H409 Unspecified glaucoma: Secondary | ICD-10-CM

## 2021-05-26 DIAGNOSIS — G629 Polyneuropathy, unspecified: Secondary | ICD-10-CM

## 2021-05-26 DIAGNOSIS — E041 Nontoxic single thyroid nodule: Secondary | ICD-10-CM | POA: Diagnosis not present

## 2021-05-26 MED ORDER — KETOROLAC TROMETHAMINE 60 MG/2ML IM SOLN
60.0000 mg | Freq: Once | INTRAMUSCULAR | Status: AC
  Administered 2021-05-26: 60 mg via INTRAMUSCULAR

## 2021-05-26 NOTE — Assessment & Plan Note (Signed)
Follow-up with ophthalmology.  Symptoms are stable.  Will avoid prednisone.

## 2021-05-26 NOTE — Assessment & Plan Note (Addendum)
As several areas of nerve root impingement and possible spinal cord impingement based on recent MRIs.  We will place referral to neurosurgery.  60 mg of Toradol was given.  Hopefully help some with his cervical radiculitis symptoms and she will be avoiding prednisone or other steroids due to his history of congenital glaucoma.

## 2021-05-26 NOTE — Progress Notes (Signed)
   Kirk [name] is a 80 y.o. male who presents today for an office visit.  Assessment/Plan:  New/Acute Problems: Thyroid nodule Incidentally found on cervical spine MRI.  Will obtain thyroid ultrasound per radiology recommendations.  Chronic Problems Addressed Today: Central stenosis of spinal canal Recent MRI showed moderate to severe spinal canal stenosis at the T1- T2 level with mild mass-effect on the spinal cord at this level.  It is possible this could explain some of his issues with lower extremity weakness.  Is also found to have persistent L2-L3 spinal canal stenosis with impingement of the cauda equina.  MRI also showed several areas of foraminal stenosis and degenerative changes throughout.  Does not have any current signs of cauda equina syndrome or need for urgent intervention however it is concerning given his relative weakness in bilateral lower extremities as well as loss of muscle mass.  We will refer to neurosurgery for further evaluation and management.  Discussed reasons to return to care or seek emergent care.  Neuropathy ([place]) As several areas of nerve root impingement and possible spinal cord impingement based on recent MRIs.  We will place referral to neurosurgery.  60 mg of Toradol was given.  Hopefully help some with his cervical radiculitis symptoms and she will be avoiding prednisone or other steroids due to his history of congenital glaucoma.  Glaucoma Follow-up with ophthalmology.  Symptoms are stable.  Will avoid prednisone.     Subjective:  HPI:  He is here to discuss his recent MRI results.  We saw him a couple of weeks ago for continued issues with peripheral neuropathy and instable gait.  At that time we decided to obtain complete imaging of his central nervous system.  Over the last couple weeks symptoms have been relatively stable.  Does have occasional paresthesias in upper extremities however predominate symptoms continues to be weakness in lower  extremities and unstable gait.  Is otherwise doing well.  Has some intermittent pain they takes Tylenol for.  See A/P for status of chronic conditions.       Objective:  Physical Exam: BP (!) 152/73   Pulse 78   Temp 97.9 F (36.6 C) (Temporal)   Ht '6\' 1"'$  (1.854 m)   Wt 194 lb (88 kg)   SpO2 95%   BMI 25.60 kg/m   Gen: No acute distress, resting comfortably CV: Regular rate and rhythm with no murmurs appreciated Pulm: Normal work of breathing, clear to auscultation bilaterally with no crackles, wheezes, or rhonchi Neuro: Grossly normal, moves all extremities Psych: Normal affect and thought content       I,Savera Zaman,acting as a scribe for Dimas Chyle, MD.,have documented all relevant documentation on the behalf of Dimas Chyle, MD,as directed by  Dimas Chyle, MD while in the presence of Dimas Chyle, MD.  I, Dimas Chyle, MD, have reviewed all documentation for this visit. The documentation on 05/26/21 for the exam, diagnosis, procedures, and orders are all accurate and complete.  Time Spent: 50 minutes of total time was spent on the date of the encounter performing the following actions: chart review prior to seeing the patient, obtaining history, performing a medically necessary exam, counseling on the treatment plan, reviewing recent imaging findings with patient and his wife, placing orders, and documenting in our EHR.    Algis Greenhouse. Jerline Pain, MD 05/26/2021 4:18 PM

## 2021-05-26 NOTE — Assessment & Plan Note (Signed)
Recent MRI showed moderate to severe spinal canal stenosis at the T1- T2 level with mild mass-effect on the spinal cord at this level.  It is possible this could explain some of his issues with lower extremity weakness.  Is also found to have persistent L2-L3 spinal canal stenosis with impingement of the cauda equina.  MRI also showed several areas of foraminal stenosis and degenerative changes throughout.  Does not have any current signs of cauda equina syndrome or need for urgent intervention however it is concerning given his relative weakness in bilateral lower extremities as well as loss of muscle mass.  We will refer to neurosurgery for further evaluation and management.  Discussed reasons to return to care or seek emergent care.

## 2021-05-26 NOTE — Patient Instructions (Signed)
It was very nice to see you today!  I will refer you to see a neurosurgeon.  We will order an ultrasound of your thyroid.  We will give you an injection of Toradol today.  Please let us know if you have not heard anything back about the referral by next week.  Take care, Dr Jerline Pain  PLEASE NOTE:  If you had any lab tests please let us know if you have not heard back within a few days. You may see your results on mychart before we have a chance to review them but we will give you a call once they are reviewed by [place]. If we ordered any referrals today, please let us know if you have not heard from their office within the next week.   Please try these tips to maintain a healthy lifestyle:  Eat at least 3 REAL meals and 1-2 snacks per day.  Aim for no more than 5 hours between eating.  If you eat breakfast, please do so within one hour of getting up.   Each meal should contain half fruits/vegetables, one quarter protein, and one quarter carbs (no bigger than a computer mouse)  Cut down on sweet beverages. This includes juice, soda, and sweet tea.   Drink at least 1 glass of water with each meal and aim for at least 8 glasses per day  Exercise at least 150 minutes every week.

## 2021-06-02 ENCOUNTER — Telehealth: Payer: Self-pay

## 2021-06-02 MED ORDER — TRAMADOL HCL 50 MG PO TABS: 50.0000 mg | ORAL_TABLET | Freq: Three times a day (TID) | ORAL | 0 refills | Status: AC | PRN

## 2021-06-02 NOTE — Telephone Encounter (Signed)
Patient stated he is still Leg pain and is taking Advil and its not helping. Would like to see if something else could be sent in. Please advise.

## 2021-06-02 NOTE — Addendum Note (Signed)
Addended by: [name] on: 06/02/2021 04:21 PM   Modules accepted: Orders

## 2021-06-02 NOTE — Telephone Encounter (Signed)
Patient agreed with Rx  Rx send to pharmacy

## 2021-06-02 NOTE — Telephone Encounter (Signed)
See note

## 2021-06-02 NOTE — Telephone Encounter (Signed)
We can send in tramadol if he wishes to try this.  Algis Greenhouse. Jerline Pain, MD 06/02/2021 2:01 PM

## 2021-06-03 ENCOUNTER — Ambulatory Visit
Admission: RE | Admit: 2021-06-03 | Discharge: 2021-06-03 | Disposition: A | Discharge status: Home / Self Care | Payer: PPO | Source: Ambulatory Visit | Attending: Family Medicine | Admitting: Family Medicine

## 2021-06-03 DIAGNOSIS — E042 Nontoxic multinodular goiter: Secondary | ICD-10-CM | POA: Diagnosis not present

## 2021-06-03 DIAGNOSIS — E041 Nontoxic single thyroid nodule: Secondary | ICD-10-CM

## 2021-06-05 ENCOUNTER — Other Ambulatory Visit: Payer: Self-pay | Admitting: *Deleted

## 2021-06-05 DIAGNOSIS — E042 Nontoxic multinodular goiter: Secondary | ICD-10-CM

## 2021-06-05 NOTE — Progress Notes (Signed)
Please inform patient of the following:  His ultrasound showed multiple nodules. I dont think this has anything to do with the weakness issues that he has been having but radiology recommended obtaining a biopsy at this point. Given his previous issues with thyroid numbers being off I think it would be a good idea to have him see endocrinology. Please place referral.  Tahira Olivarez M. Jerline Pain, MD 06/05/2021 11:36 AM

## 2021-07-21 ENCOUNTER — Ambulatory Visit (INDEPENDENT_AMBULATORY_CARE_PROVIDER_SITE_OTHER): Payer: PPO | Admitting: Endocrinology

## 2021-07-21 ENCOUNTER — Other Ambulatory Visit: Payer: Self-pay

## 2021-07-21 ENCOUNTER — Encounter: Payer: Self-pay | Admitting: Endocrinology

## 2021-07-21 VITALS — BP 180/100 | HR 91 | Ht 73.0 in | Wt 192.2 lb

## 2021-07-21 DIAGNOSIS — E039 Hypothyroidism, unspecified: Secondary | ICD-10-CM

## 2021-07-21 DIAGNOSIS — R7989 Other specified abnormal findings of blood chemistry: Secondary | ICD-10-CM | POA: Diagnosis not present

## 2021-07-21 LAB — TSH: TSH: 3.53 u[IU]/mL (ref 0.35–5.50)

## 2021-07-21 LAB — T4, FREE: Free T4: 0.72 ng/dL (ref 0.60–1.60)

## 2021-07-21 NOTE — Patient Instructions (Addendum)
Your blood pressure is high today.  Please see your primary care provider soon, to have it rechecked.   Also, sometimes the cough is due to the blood pressure medication.   Blood tests are requested for you today.  We'll let you know about the results.   Please come back for a follow-up appointment in 6 months.

## 2021-07-21 NOTE — Progress Notes (Signed)
Subjective:    Patient ID: Kirk [name], male    DOB: [birth year], 80 y.o.   MRN: [id number]  HPI Pt is referred by Dr Jerline Pain, for nodular thyroid.  Pt was noted to have a thyroid nodule in 2022.  He is unaware of ever having had thyroid problems in the past.  He has no h/o XRT or surgery to the neck.  Hypothyroidism was dx'ed in 2017.  Synthroid is on med list since 2021, but pt insists he does not take this.   Past Medical History:  Diagnosis Date   Cataract    Congenital glaucoma    "both eyes operated on when I was 55 months old"   Cough    Disturbance of skin sensation    Diverticulosis of colon 2014   Diverticulum of esophagus, acquired    Dysphagia, unspecified(787.20)    Encounter for long-term (current) use of other medications    Herpes zoster without mention of complication    Hyperglycemia    patient denies   Hyperlipidemia    Hypertension    Hypertrophy of prostate without urinary obstruction and other lower urinary tract symptoms (LUTS)    Lumbago    Nevus, non-neoplastic    Osteoarthrosis, unspecified whether generalized or localized, unspecified site    pt. denies   Other premature beats    Pain in limb    Plantar fascial fibromatosis    Special screening for malignant neoplasm of prostate    Unspecified glaucoma(365.9)    Unspecified pruritic disorder    Unspecified vitamin D deficiency    Urinary frequency    Zenker's diverticulum     Past Surgical History:  Procedure Laterality Date   CATARACT EXTRACTION BILATERAL W/ ANTERIOR VITRECTOMY Bilateral 1977   COLONOSCOPY  09/06/2013   [name]   DIRECT LARYNGOSCOPY  10/22/2015   Cervical esophagoscopy.  Endoscopic esophageal diverticulotomy.    [place]   congenital glaucoma   HERNIA REPAIR  1993   INCISIONAL HERNIA REPAIR  2008   LARYNGOSCOPY     with stapling of zenkers diverticulum   LUMBAR LAMINECTOMY/ DECOMPRESSION WITH MET-RX Left 08/16/2019   Procedure:  Left Lumbar Two-Three Minimally Invasive Laminectomy and Microdiscectomy;  Surgeon: Judith Part, MD;  Location: [facility];  Service: Neurosurgery;  Laterality: Left;  Left Lumbar 2-3 Minimally invasive laminectomy and microdiscectomy    Social History   Socioeconomic History   Marital status: Married    Spouse name: Not on file   Number of children: Not on file   Years of education: Not on file   Highest education level: Not on file  Occupational History   Not on file  Tobacco Use   Smoking status: Never   Smokeless tobacco: Never  Vaping Use   Vaping Use: Never used  Substance and Sexual Activity   Alcohol use: No   Drug use: No   Sexual activity: Not on file  Other Topics Concern   Not on file  Social History Narrative   Married   Lives at home with wife Pamala Hurry)   No regular exercise, but does his own yard work and household chores.   Never smoked   Alcohol none   Social Determinants of Health   Financial Resource Strain: Not on file  Food Insecurity: Not on file  Transportation Needs: Not on file  Physical Activity: Not on file  Stress: Not on file  Social Connections: Not on file  Intimate Partner  Violence: Not on file    Current Outpatient Medications on File Prior to Visit  Medication Sig Dispense Refill   acetaminophen (TYLENOL) 325 MG tablet Take 650 mg by mouth every 6 (six) hours as needed (pain).      brimonidine (ALPHAGAN) 0.2 % ophthalmic solution Place 1 drop into both eyes 2 (two) times daily.      Cholecalciferol 25 MCG (1000 UT) tablet Take 1,000 Units by mouth daily.      DULoxetine (CYMBALTA) 20 MG capsule TAKE ONE CAPSULE BY MOUTH DAILY 30 capsule 0   lisinopril-hydrochlorothiazide (ZESTORETIC) 20-12.5 MG tablet TAKE 1/2 TABLET BY MOUTH DAILY 45 tablet 5   timolol (TIMOPTIC) 0.5 % ophthalmic solution Place 1 drop into the right eye daily.      No current facility-administered medications on file prior to visit.    Allergies  Allergen  Reactions   Reserpine Other (See Comments)    Unknown reaction   Sulfa Antibiotics Other (See Comments)    Patient denies - Unknown reaction    Family History  Problem Relation Age of Onset   Hypertension Mother    Dementia Mother    Kidney disease Father    CAD Father    Colon cancer Neg Hx    Throat cancer Neg Hx    Diabetes Neg Hx    Liver disease Neg Hx    Thyroid disease Neg Hx     BP (!) 180/100 (BP Location: Right Arm, Patient Position: Sitting, Cuff Size: Normal)   Pulse 91   Ht 6' 1" (1.854 m)   Wt 192 lb 3.2 oz (87.2 kg)   SpO2 96%   BMI 25.36 kg/m    Review of Systems Denies hoarseness, neck pain, sob.  He has a chronic cough.      Objective:   Physical Exam VITAL SIGNS:  See vs page GENERAL: no distress NECK: There is no palpable thyroid enlargement.  I cannot palpate the thyroid nodule.  No palpable lymphadenopathy at the anterior neck.     [place] (2022): Multinodular thyroid gland. 2. Nodule labeled 1 appears to represent a large and irregular complex solid and cystic nodule in the superior aspect of the right thyroid lobe. This nodule meets criteria for percutaneous sampling. 3. An additional nodule labeled 4 in the left thyroid lobe meets criteria for follow-up imaging in 1 year.  Lab Results  Component Value Date   TSH 3.53 07/21/2021    I have reviewed outside records, and summarized: Pt was noted to have MNG, and referred here.  He had MRI of the C-spine, and thyroid nodule was incidentally noted.      Assessment & Plan:  MNG, new to me, uncertain etiology and prognosis.  I advised bx, but pt declines Hypothyroidism: stable off rx.  I told pt that he can stay off synthroid for now, but we'll need to follow this.

## 2021-07-22 ENCOUNTER — Telehealth: Payer: Self-pay | Admitting: Endocrinology

## 2021-07-22 NOTE — Telephone Encounter (Signed)
Called patient's wife back and informed her of the lab results.

## 2021-07-28 DIAGNOSIS — H402233 Chronic angle-closure glaucoma, bilateral, severe stage: Secondary | ICD-10-CM | POA: Diagnosis not present

## 2021-08-24 ENCOUNTER — Emergency Department (HOSPITAL_BASED_OUTPATIENT_CLINIC_OR_DEPARTMENT_OTHER): Payer: PPO

## 2021-08-24 ENCOUNTER — Emergency Department (HOSPITAL_BASED_OUTPATIENT_CLINIC_OR_DEPARTMENT_OTHER)
Admission: EM | Admit: 2021-08-24 | Discharge: 2021-08-24 | Disposition: A | Discharge status: Home / Self Care | Payer: PPO | Attending: Emergency Medicine | Admitting: Emergency Medicine

## 2021-08-24 ENCOUNTER — Emergency Department (HOSPITAL_BASED_OUTPATIENT_CLINIC_OR_DEPARTMENT_OTHER): Payer: PPO | Admitting: Radiology

## 2021-08-24 ENCOUNTER — Encounter (HOSPITAL_BASED_OUTPATIENT_CLINIC_OR_DEPARTMENT_OTHER): Payer: Self-pay | Admitting: Emergency Medicine

## 2021-08-24 ENCOUNTER — Other Ambulatory Visit: Payer: Self-pay

## 2021-08-24 DIAGNOSIS — Z79899 Other long term (current) drug therapy: Secondary | ICD-10-CM | POA: Diagnosis not present

## 2021-08-24 DIAGNOSIS — I700 Atherosclerosis of aorta: Secondary | ICD-10-CM | POA: Diagnosis not present

## 2021-08-24 DIAGNOSIS — R0789 Other chest pain: Secondary | ICD-10-CM | POA: Diagnosis not present

## 2021-08-24 DIAGNOSIS — J939 Pneumothorax, unspecified: Secondary | ICD-10-CM | POA: Diagnosis not present

## 2021-08-24 DIAGNOSIS — J9311 Primary spontaneous pneumothorax: Secondary | ICD-10-CM | POA: Diagnosis not present

## 2021-08-24 DIAGNOSIS — Z20822 Contact with and (suspected) exposure to covid-19: Secondary | ICD-10-CM | POA: Insufficient documentation

## 2021-08-24 DIAGNOSIS — J9811 Atelectasis: Secondary | ICD-10-CM | POA: Diagnosis not present

## 2021-08-24 DIAGNOSIS — I10 Essential (primary) hypertension: Secondary | ICD-10-CM | POA: Diagnosis not present

## 2021-08-24 DIAGNOSIS — R079 Chest pain, unspecified: Secondary | ICD-10-CM | POA: Diagnosis present

## 2021-08-24 LAB — CBC
HCT: 52.4 % — ABNORMAL HIGH (ref 39.0–52.0)
Hemoglobin: 17.5 g/dL — ABNORMAL HIGH (ref 13.0–17.0)
MCH: 30.1 pg (ref 26.0–34.0)
MCHC: 33.4 g/dL (ref 30.0–36.0)
MCV: 90.2 fL (ref 80.0–100.0)
Platelets: 205 10*3/uL (ref 150–400)
RBC: 5.81 MIL/uL (ref 4.22–5.81)
RDW: 13.5 % (ref 11.5–15.5)
WBC: 8.2 10*3/uL (ref 4.0–10.5)
nRBC: 0 % (ref 0.0–0.2)

## 2021-08-24 LAB — BASIC METABOLIC PANEL
Anion gap: 12 (ref 5–15)
BUN: 16 mg/dL (ref 8–23)
CO2: 25 mmol/L (ref 22–32)
Calcium: 10.2 mg/dL (ref 8.9–10.3)
Chloride: 100 mmol/L (ref 98–111)
Creatinine, Ser: 0.9 mg/dL (ref 0.61–1.24)
GFR, Estimated: >60 mL/min (ref >60)
Glucose, Bld: 169 mg/dL — ABNORMAL HIGH (ref 70–99)
Potassium: 4 mmol/L (ref 3.5–5.1)
Sodium: 137 mmol/L (ref 135–145)

## 2021-08-24 LAB — RESP PANEL BY RT-PCR (FLU A&B, COVID) ARPGX2
Influenza A by PCR: NEGATIVE
Influenza B by PCR: NEGATIVE
SARS Coronavirus 2 by RT PCR: NEGATIVE

## 2021-08-24 LAB — TROPONIN I (HIGH SENSITIVITY)
Troponin I (High Sensitivity): 6 ng/L (ref <18)
Troponin I (High Sensitivity): 7 ng/L (ref <18)

## 2021-08-24 LAB — MAGNESIUM: Magnesium: 2.3 mg/dL (ref 1.7–2.4)

## 2021-08-24 MED ORDER — KETOROLAC TROMETHAMINE 15 MG/ML IJ SOLN
15.0000 mg | Freq: Once | INTRAMUSCULAR | Status: AC
  Administered 2021-08-24: 15 mg via INTRAVENOUS
  Filled 2021-08-24: qty 1

## 2021-08-24 MED ORDER — IOHEXOL 300 MG/ML  SOLN
65.0000 mL | Freq: Once | INTRAMUSCULAR | Status: AC | PRN
  Administered 2021-08-24: 65 mL via INTRAVENOUS

## 2021-08-24 NOTE — Discharge Instructions (Signed)
Treat pain with ibuprofen and Tylenol.  There is a number below for Dr. Abran Duke office.  Call them as soon as possible to schedule an appointment this week.  They would like to see within the next 1 to 2 days.  If you develop any worsening pain or shortness of breath, please return to the emergency department.

## 2021-08-24 NOTE — ED Provider Notes (Signed)
[facility] EMERGENCY DEPT Provider Note   CSN: [id number] Arrival date & time: 08/24/21  9678     History Chief Complaint  Patient presents with   Chest Pain    Kirk [name] is a 80 y.o. male.   Chest Pain Associated symptoms: no abdominal pain, no back pain, no cough, no dizziness, no fatigue, no fever, no headache, no nausea, no numbness, no palpitations, no shortness of breath, no vomiting and no weakness   Patient presents for left-sided chest pain that started this morning.  It was present when he woke up.  It is throughout his anterior left hemithorax.  Pain does not radiate.  He denies any shortness of breath.  Patient has no known chronic cardiac or lung disease.  He has no smoking history.  He reports that 62 years ago, he had a "collapsed lung".  This did not require surgical intervention.  I did resolve on its own with bedrest.  He was in his normal state of health yesterday.  Patient does report a chronic cough for the past year that he attributes to his lisinopril.  Yesterday, patient was in his normal state of health.    Past Medical History:  Diagnosis Date   Cataract    Congenital glaucoma    "both eyes operated on when I was 63 months old"   Cough    Disturbance of skin sensation    Diverticulosis of colon 2014   Diverticulum of esophagus, acquired    Dysphagia, unspecified(787.20)    Encounter for long-term (current) use of other medications    Herpes zoster without mention of complication    Hyperglycemia    patient denies   Hyperlipidemia    Hypertension    Hypertrophy of prostate without urinary obstruction and other lower urinary tract symptoms (LUTS)    Lumbago    Nevus, non-neoplastic    Osteoarthrosis, unspecified whether generalized or localized, unspecified site    pt. 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   Acute right ankle pain 07/19/2018   TSH elevation 02/25/2016   Orthostatic hypotension 02/17/2016   Syncope 01/07/2016   Esophageal diverticulum, acquired 10/22/2015   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  Endoscopic esophageal diverticulotomy.    [place]   congenital glaucoma   HERNIA REPAIR  1993  INCISIONAL HERNIA REPAIR  2008   LARYNGOSCOPY     with stapling of zenkers diverticulum   LUMBAR LAMINECTOMY/ DECOMPRESSION WITH MET-RX Left 08/16/2019   Procedure: Left Lumbar Two-Three Minimally Invasive Laminectomy and Microdiscectomy;  Surgeon: Judith Part, MD;  Location: [facility];  Service: Neurosurgery;  Laterality: Left;  Left Lumbar 2-3 Minimally invasive laminectomy and microdiscectomy       Family History  Problem Relation Age of Onset   Hypertension Mother    Dementia Mother    Kidney disease Father    CAD Father    Colon cancer Neg Hx    Throat cancer Neg Hx    Diabetes  Neg Hx    Liver disease Neg Hx    Thyroid disease Neg Hx     Social History   Tobacco Use   Smoking status: Never   Smokeless tobacco: Never  Vaping Use   Vaping Use: Never used  Substance Use Topics   Alcohol use: No   Drug use: No    Home Medications Prior to Admission medications   Medication Sig Start Date End Date Taking? Authorizing Provider  acetaminophen (TYLENOL) 325 MG tablet Take 650 mg by mouth every 6 (six) hours as needed (pain).     [provider]  brimonidine (ALPHAGAN) 0.2 % ophthalmic solution Place 1 drop into both eyes 2 (two) times daily.  01/25/18   [provider]  Cholecalciferol 25 MCG (1000 UT) tablet Take 1,000 Units by mouth daily.     [provider]  DULoxetine (CYMBALTA) 20 MG capsule TAKE ONE CAPSULE BY MOUTH DAILY 10/13/20   Lyndal Pulley, DO  lisinopril-hydrochlorothiazide (ZESTORETIC) 20-12.5 MG tablet TAKE 1/2 TABLET BY MOUTH DAILY 09/01/20   [name], MD  timolol (TIMOPTIC) 0.5 % ophthalmic solution Place 1 drop into the right eye daily.  08/20/13   [provider]    Allergies    Reserpine and Sulfa antibiotics  Review of Systems   Review of Systems  Constitutional:  Negative for activity change, appetite change, chills, fatigue and fever.  HENT:  Negative for congestion, ear pain and sore throat.   Eyes:  Negative for pain and visual disturbance.  Respiratory:  Negative for cough, chest tightness, shortness of breath, wheezing and stridor.   Cardiovascular:  Positive for chest pain. Negative for palpitations.  Gastrointestinal:  Negative for abdominal pain, nausea and vomiting.  Genitourinary:  Negative for dysuria, flank pain and hematuria.  Musculoskeletal:  Negative for arthralgias, back pain, myalgias and neck pain.  Skin:  Negative for color change and rash.  Neurological:  Negative for dizziness, seizures, syncope, weakness, light-headedness, numbness and headaches.  Hematological:   Does not bruise/bleed easily.  Psychiatric/Behavioral:  Negative for confusion and decreased concentration.   All other systems reviewed and are negative.  Physical Exam Updated Vital Signs BP 128/85   Pulse 81   Temp 98.4 F (36.9 C) (Oral)   Resp 15   Ht _0  (1.854 m)   Wt 87.5 kg   SpO2 96%   BMI 25.46 kg/m   Physical Exam Vitals and nursing note reviewed.  Constitutional:      General: He is not in acute distress.    Appearance: He is well-developed and normal weight. He is not ill-appearing, toxic-appearing or diaphoretic.  HENT:     Head: Normocephalic and atraumatic.  Eyes:     Extraocular Movements: Extraocular movements intact.     Conjunctiva/sclera: Conjunctivae normal.  Neck:  Vascular: No JVD.  Cardiovascular:     Rate and Rhythm: Normal rate and regular rhythm.     Heart sounds: No murmur heard. Pulmonary:     Effort: Pulmonary effort is normal. No respiratory distress.     Breath sounds: Examination of the right-upper field reveals decreased breath sounds. Decreased breath sounds present. No wheezing, rhonchi or rales.  Chest:     Chest wall: No tenderness or crepitus.  Abdominal:     Palpations: Abdomen is soft.     Tenderness: There is no abdominal tenderness.  Musculoskeletal:        General: No swelling.     Cervical back: Normal range of motion and neck supple.     Right lower leg: No edema.     Left lower leg: No edema.  Skin:    General: Skin is warm and dry.     Capillary Refill: Capillary refill takes less than 2 seconds.     Coloration: Skin is not cyanotic or pale.  Neurological:     General: No focal deficit present.     Mental Status: He is alert and oriented to person, place, and time.     Motor: No weakness.  Psychiatric:        Mood and Affect: Mood normal.        Behavior: Behavior normal.    ED Results / Procedures / Treatments   Labs (all labs ordered are listed, but only abnormal results are displayed) Labs Reviewed   BASIC METABOLIC PANEL - Abnormal; Notable for the following components:      Result Value   Glucose, Bld 169 (*)    All other components within normal limits  CBC - Abnormal; Notable for the following components:   Hemoglobin 17.5 (*)    HCT 52.4 (*)    All other components within normal limits  RESP PANEL BY RT-PCR (FLU A&B, COVID) ARPGX2  MAGNESIUM  TROPONIN I (HIGH SENSITIVITY)  TROPONIN I (HIGH SENSITIVITY)    EKG EKG Interpretation  Date/Time:  Monday August 24 2021 09:17:18 EST Ventricular Rate:  94 PR Interval:  158 QRS Duration: 82 QT Interval:  364 QTC Calculation: 455 R Axis:   -49 Text Interpretation: Sinus rhythm with frequent Premature ventricular complexes Pulmonary disease pattern Left anterior fascicular block Abnormal ECG Confirmed by Godfrey Pick ([id number]) on 08/24/2021 9:25:16 AM  Radiology DG Chest 2 View  Result Date: 08/24/2021 CLINICAL DATA:  Left-sided chest pain EXAM: CHEST - 2 VIEW COMPARISON:  None. FINDINGS: The cardiomediastinal silhouette is within normal limits. There is no focal airspace consolidation. There is a small left apical pneumothorax measuring up to 1.5 cm at the apex. No rib widening or mediastinal shift. No large pleural effusion. No acute osseous abnormality. Thoracic spondylosis. IMPRESSION: Small left apical pneumothorax. These results were called by telephone at the time of interpretation on 08/24/2021 at 9:45 am to ED provider, who verbally acknowledged these results. Electronically Signed   By: Maurine Simmering M.D.   On: 08/24/2021 09:46   CT Chest W Contrast  Result Date: 08/24/2021 CLINICAL DATA:  Cough, persistent Pneumothorax Chest pain or SOB, pleurisy or effusion suspected Pneumonia, effusion or abscess suspected, xray done EXAM: CT CHEST WITH CONTRAST TECHNIQUE: Multidetector CT imaging of the chest was performed during intravenous contrast administration. CONTRAST:  32m OMNIPAQUE IOHEXOL 300 MG/ML  SOLN COMPARISON:  None.  FINDINGS: Cardiovascular: Coronary artery and aortic calcifications. No aneurysm. Heart is normal size. Mediastinum/Nodes: No mediastinal, hilar, or axillary adenopathy. Trachea  and esophagus are unremarkable. Thyroid unremarkable. Lungs/Pleura: Small left side pneumothorax as seen on chest x-ray. Left base atelectasis. Otherwise no confluent opacities. No suspicious pulmonary nodules or effusions. Upper Abdomen: Mild diffuse low-density throughout the liver suggesting fatty infiltration. Musculoskeletal: Chest wall soft tissues are unremarkable. No acute bony abnormality. IMPRESSION: Small left side pneumothorax.  Left base atelectasis. Coronary artery disease. Aortic Atherosclerosis (ICD10-I70.0). Electronically Signed   By: Rolm Baptise M.D.   On: 08/24/2021 10:49   DG Chest Portable 1 View  Result Date: 08/24/2021 CLINICAL DATA:  Left pneumothorax EXAM: PORTABLE CHEST 1 VIEW COMPARISON:  Previous studies including the examination done earlier today FINDINGS: Cardiac size is within normal limits. Left hemidiaphragm is elevated. There is crowding of markings in the left lower lung fields. There is small left apical pneumothorax measuring 12 mm in diameter. There is minimal decrease in size. Left lung remains well expanded. There is no pleural effusion. IMPRESSION: There is slight decrease in size of left apical pneumothorax. Linear densities in the left lower lung fields may suggest crowding of bronchovascular structures due to elevation of left hemidiaphragm or suggest subsegmental atelectasis. Electronically Signed   By: Elmer Picker M.D.   On: 08/24/2021 12:53    Procedures Procedures   Medications Ordered in ED Medications  iohexol (OMNIPAQUE) 300 MG/ML solution 65 mL (65 mLs Intravenous Contrast Given 08/24/21 1035)  ketorolac (TORADOL) 15 MG/ML injection 15 mg (15 mg Intravenous Given 08/24/21 1415)    ED Course  I have reviewed the triage vital signs and the nursing  notes.  Pertinent labs & imaging results that were available during my care of the patient were reviewed by me and considered in my medical decision making (see chart for details).    MDM Rules/Calculators/A&P                          Patient is a 80 year old male who presents for cute onset of left-sided chest pain this morning.  He has no known history of chronic cardiac or lung disease.  EKG shows no evidence of ischemia.  Patient underwent a chest x-ray which did show a small left-sided pneumothorax.  In absence of recent trauma or known lung disease, pneumothorax assumed to be spontaneous.  Interestingly, patient does describe a "collapsed lung" that he was a teenager also is described as spontaneous.  On exam, he does appear comfortable.  He does endorse continued, mild, left-sided chest pain.  His breathing is even and unlabored.  His SPO2 is normal on room air.  Patient's remaining vital signs are normal as well.  CT scan was obtained to identify possible underlying etiology.  Results showed no suspicious nodules, effusions, or blebs.  I did discuss this patient with cardiothoracic surgery team.  Recommendation from Dr. Kipp Brood is to repeat chest x-ray.  If chest x-ray shows no worsening and patient does not have any worsening symptoms, he will be appropriate for outpatient follow-up.  Repeat chest x-ray showed a slight decrease in size of pneumothorax.  Following dose of Toradol, patient reports resolution of pain.  He continues to deny any shortness of breath.  Patient was given instructions on close follow-up with cardiothoracic surgeon, Dr. Kipp Brood.  Ideally, this will be within the next 1 to 2 days.  He was also encouraged to return to the ED if he does experience any worsening of his pain or any shortness of breath.  Patient and his wife, who accompanies him at bedside,  expressed understanding.  Patient was discharged in stable condition.  Final Clinical Impression(s) / ED  Diagnoses Final diagnoses:  Primary spontaneous pneumothorax    Rx / DC Orders ED Discharge Orders     None        Godfrey Pick, MD 08/25/21 [phone]

## 2021-08-24 NOTE — ED Triage Notes (Signed)
Pt arrives to ED with c/o of left sided chest pain that started this morning. This started when he was laying in bed. The pain lasted x1 hour. The pain does not radiate. No associated symptoms noted. He did not take any medications for his pain.

## 2021-08-25 ENCOUNTER — Other Ambulatory Visit: Payer: Self-pay | Admitting: Thoracic Surgery (Cardiothoracic Vascular Surgery)

## 2021-08-25 DIAGNOSIS — J9383 Other pneumothorax: Secondary | ICD-10-CM

## 2021-08-26 ENCOUNTER — Institutional Professional Consult (permissible substitution): Payer: PPO | Admitting: Thoracic Surgery (Cardiothoracic Vascular Surgery)

## 2021-08-26 ENCOUNTER — Other Ambulatory Visit: Payer: Self-pay | Admitting: Thoracic Surgery (Cardiothoracic Vascular Surgery)

## 2021-08-26 ENCOUNTER — Other Ambulatory Visit: Payer: Self-pay

## 2021-08-26 ENCOUNTER — Ambulatory Visit
Admission: RE | Admit: 2021-08-26 | Discharge: 2021-08-26 | Disposition: A | Discharge status: Home / Self Care | Payer: PPO | Source: Ambulatory Visit | Attending: Thoracic Surgery (Cardiothoracic Vascular Surgery) | Admitting: Thoracic Surgery (Cardiothoracic Vascular Surgery)

## 2021-08-26 VITALS — BP 143/83 | HR 80 | Resp 20 | Ht 73.0 in | Wt 194.0 lb

## 2021-08-26 DIAGNOSIS — J939 Pneumothorax, unspecified: Secondary | ICD-10-CM | POA: Diagnosis not present

## 2021-08-26 DIAGNOSIS — J9811 Atelectasis: Secondary | ICD-10-CM | POA: Diagnosis not present

## 2021-08-26 DIAGNOSIS — J9383 Other pneumothorax: Secondary | ICD-10-CM

## 2021-08-26 NOTE — Progress Notes (Signed)
[address]       [place] [address]             [phone]                    [facility] Medical Record #[id number] Date of Birth: 02-05-1941  Referring: Godfrey Pick, MD Primary Care: [name], MD Primary Cardiologist: None  Chief Complaint:    Chief Complaint  Patient presents with   Spontaneous Pneumothorax    Follow up from ED visit, cxr today    History of Present Illness:    Kirk [name] 80 y.o. male presents for ER follow-up after being noted to have a left-sided spontaneous pneumothorax.  He denies any shortness of breath.  At the time of the incident he only had some sharp chest pain.       Zubrod Score: At the time of surgery this patient's most appropriate activity status/level should be described as: []    0    Normal activity, no symptoms [x]    1    Restricted in physical strenuous activity but ambulatory, able to do out light work []    2    Ambulatory and capable of self care, unable to do work activities, up and about               >50 % of waking hours                              []    3    Only limited self care, in bed greater than 50% of waking hours []    4    Completely disabled, no self care, confined to bed or chair []    5    Moribund   Past Medical History:  Diagnosis Date   Cataract    Congenital glaucoma    "both eyes operated on when I was 58 months old"   Cough    Disturbance of skin sensation    Diverticulosis of colon 2014   Diverticulum of esophagus, acquired    Dysphagia, unspecified(787.20)    Encounter for long-term (current) use of other medications    Herpes zoster without mention of complication    Hyperglycemia    patient denies   Hyperlipidemia    Hypertension    Hypertrophy of prostate without urinary obstruction and other lower urinary tract symptoms (LUTS)    Lumbago    Nevus, non-neoplastic    Osteoarthrosis, unspecified whether generalized or localized, unspecified  site    pt. denies   Other premature beats    Pain in limb    Plantar fascial fibromatosis    Special screening for malignant neoplasm of prostate    Unspecified glaucoma(365.9)    Unspecified pruritic disorder    Unspecified vitamin D deficiency    Urinary frequency    Zenker's diverticulum     Past Surgical History:  Procedure Laterality Date   CATARACT EXTRACTION BILATERAL W/ ANTERIOR VITRECTOMY Bilateral 1977   COLONOSCOPY  09/06/2013   [name]   DIRECT LARYNGOSCOPY  10/22/2015   Cervical esophagoscopy.  Endoscopic esophageal diverticulotomy.    FLEXIBLE SIGMOIDOSCOPY  1999   GLAUCOMA SURGERY  1945   congenital glaucoma   HERNIA REPAIR  1993   INCISIONAL HERNIA REPAIR  2008   LARYNGOSCOPY     with stapling of zenkers diverticulum  LUMBAR LAMINECTOMY/ DECOMPRESSION WITH MET-RX Left 08/16/2019   Procedure: Left Lumbar Two-Three Minimally Invasive Laminectomy and Microdiscectomy;  Surgeon: Judith Part, MD;  Location: [facility];  Service: Neurosurgery;  Laterality: Left;  Left Lumbar 2-3 Minimally invasive laminectomy and microdiscectomy    Family History  Problem Relation Age of Onset   Hypertension Mother    Dementia Mother    Kidney disease Father    CAD Father    Colon cancer Neg Hx    Throat cancer Neg Hx    Diabetes Neg Hx    Liver disease Neg Hx    Thyroid disease Neg Hx      Social History   Tobacco Use  Smoking Status Never  Smokeless Tobacco Never    Social History   Substance and Sexual Activity  Alcohol Use No     Allergies  Allergen Reactions   Reserpine Other (See Comments)    Unknown reaction   Sulfa Antibiotics Other (See Comments)    Patient denies - Unknown reaction    Current Outpatient Medications  Medication Sig Dispense Refill   acetaminophen (TYLENOL) 325 MG tablet Take 650 mg by mouth every 6 (six) hours as needed (pain).      brimonidine (ALPHAGAN) 0.2 % ophthalmic solution Place 1 drop into both eyes 2 (two) times daily.       lisinopril-hydrochlorothiazide (ZESTORETIC) 20-12.5 MG tablet TAKE 1/2 TABLET BY MOUTH DAILY 45 tablet 5   timolol (TIMOPTIC) 0.5 % ophthalmic solution Place 1 drop into the right eye daily.      Cholecalciferol 25 MCG (1000 UT) tablet Take 1,000 Units by mouth daily.  (Patient not taking: Reported on 08/26/2021)     DULoxetine (CYMBALTA) 20 MG capsule TAKE ONE CAPSULE BY MOUTH DAILY (Patient not taking: Reported on 08/26/2021) 30 capsule 0   No current facility-administered medications for this visit.    Review of Systems  Respiratory: Negative.    Cardiovascular: Negative.     PHYSICAL EXAMINATION: BP (!) 143/83 (BP Location: Right Arm, Patient Position: Sitting)   Pulse 80   Resp 20   Ht 6' 1" (1.854 m)   Wt 194 lb (88 kg)   SpO2 96% Comment: RA  BMI 25.60 kg/m  Physical Exam Constitutional:      General: He is not in acute distress.    Appearance: Normal appearance. He is not ill-appearing.  HENT:     Head: Normocephalic and atraumatic.  Cardiovascular:     Rate and Rhythm: Normal rate.  Pulmonary:     Effort: Pulmonary effort is normal. No respiratory distress.  Skin:    General: Skin is warm and dry.  Neurological:     General: No focal deficit present.     Mental Status: He is alert and oriented to person, place, and time.    Diagnostic Studies & Laboratory data:     Recent Radiology Findings:   DG Chest 2 View  Result Date: 08/26/2021 CLINICAL DATA:  Spontaneous pneumothorax EXAM: CHEST - 2 VIEW COMPARISON:  08/24/2021 FINDINGS: Frontal and lateral views of the chest demonstrate a stable left apical pneumothorax, pleural separation measuring approximate 1.2 cm and unchanged since prior exam. Minimal atelectasis at the left lung base is unchanged. No large effusion. Right chest is clear. Cardiac silhouette is unremarkable. IMPRESSION: 1. Stable trace left apical pneumothorax and minimal left basilar atelectasis. Electronically Signed   By: Randa Ngo M.D.    On: 08/26/2021 15:24   DG Chest 2 View  Result Date:  08/24/2021 CLINICAL DATA:  Left-sided chest pain EXAM: CHEST - 2 VIEW COMPARISON:  None. FINDINGS: The cardiomediastinal silhouette is within normal limits. There is no focal airspace consolidation. There is a small left apical pneumothorax measuring up to 1.5 cm at the apex. No rib widening or mediastinal shift. No large pleural effusion. No acute osseous abnormality. Thoracic spondylosis. IMPRESSION: Small left apical pneumothorax. These results were called by telephone at the time of interpretation on 08/24/2021 at 9:45 am to ED provider, who verbally acknowledged these results. Electronically Signed   By: Maurine Simmering M.D.   On: 08/24/2021 09:46   CT Chest W Contrast  Result Date: 08/24/2021 CLINICAL DATA:  Cough, persistent Pneumothorax Chest pain or SOB, pleurisy or effusion suspected Pneumonia, effusion or abscess suspected, xray done EXAM: CT CHEST WITH CONTRAST TECHNIQUE: Multidetector CT imaging of the chest was performed during intravenous contrast administration. CONTRAST:  58m OMNIPAQUE IOHEXOL 300 MG/ML  SOLN COMPARISON:  None. FINDINGS: Cardiovascular: Coronary artery and aortic calcifications. No aneurysm. Heart is normal size. Mediastinum/Nodes: No mediastinal, hilar, or axillary adenopathy. Trachea and esophagus are unremarkable. Thyroid unremarkable. Lungs/Pleura: Small left side pneumothorax as seen on chest x-ray. Left base atelectasis. Otherwise no confluent opacities. No suspicious pulmonary nodules or effusions. Upper Abdomen: Mild diffuse low-density throughout the liver suggesting fatty infiltration. Musculoskeletal: Chest wall soft tissues are unremarkable. No acute bony abnormality. IMPRESSION: Small left side pneumothorax.  Left base atelectasis. Coronary artery disease. Aortic Atherosclerosis (ICD10-I70.0). Electronically Signed   By: KRolm BaptiseM.D.   On: 08/24/2021 10:49   DG Chest Portable 1 View  Result Date:  08/24/2021 CLINICAL DATA:  Left pneumothorax EXAM: PORTABLE CHEST 1 VIEW COMPARISON:  Previous studies including the examination done earlier today FINDINGS: Cardiac size is within normal limits. Left hemidiaphragm is elevated. There is crowding of markings in the left lower lung fields. There is small left apical pneumothorax measuring 12 mm in diameter. There is minimal decrease in size. Left lung remains well expanded. There is no pleural effusion. IMPRESSION: There is slight decrease in size of left apical pneumothorax. Linear densities in the left lower lung fields may suggest crowding of bronchovascular structures due to elevation of left hemidiaphragm or suggest subsegmental atelectasis. Electronically Signed   By: PElmer PickerM.D.   On: 08/24/2021 12:53       I have independently reviewed the above radiology studies  and reviewed the findings with the patient.   Recent Lab Findings: Lab Results  Component Value Date   WBC 8.2 08/24/2021   HGB 17.5 (H) 08/24/2021   HCT 52.4 (H) 08/24/2021   PLT 205 08/24/2021   GLUCOSE 169 (H) 08/24/2021   CHOL 168 07/06/2018   TRIG 288.0 (H) 07/06/2018   HDL 37.50 (L) 07/06/2018   LDLDIRECT 107.0 07/06/2018   LDLCALC 92 01/25/2018   ALT 27 06/16/2020   AST 21 06/16/2020   NA 137 08/24/2021   K 4.0 08/24/2021   CL 100 08/24/2021   CREATININE 0.90 08/24/2021   BUN 16 08/24/2021   CO2 25 08/24/2021   TSH 3.53 07/21/2021   HGBA1C 6.2 01/25/2018       Assessment / Plan:   80year old male with a left-sided spontaneous pneumothorax.  I personally reviewed his chest x-ray today it is essentially stable and remains small and apical.  I also reviewed his cross-sectional imaging from his ER visit.  He did not have any bullous disease warranting surgical resection.  We discussed symptoms and risks worsening of his pneumothorax.  He is aware of when to present back to the emergency department.  He will obtain another chest x-ray in 1 month and  I will with him virtually at that point.     I  spent 40 minutes with  the patient face to face in counseling and coordination of care.    Lajuana Matte 08/26/2021 4:39 PM

## 2021-09-23 ENCOUNTER — Other Ambulatory Visit: Payer: Self-pay

## 2021-09-23 ENCOUNTER — Ambulatory Visit (HOSPITAL_BASED_OUTPATIENT_CLINIC_OR_DEPARTMENT_OTHER)
Admission: RE | Admit: 2021-09-23 | Discharge: 2021-09-23 | Disposition: A | Discharge status: Home / Self Care | Payer: PPO | Source: Ambulatory Visit | Attending: Thoracic Surgery (Cardiothoracic Vascular Surgery) | Admitting: Thoracic Surgery (Cardiothoracic Vascular Surgery)

## 2021-09-23 DIAGNOSIS — J9383 Other pneumothorax: Secondary | ICD-10-CM | POA: Insufficient documentation

## 2021-09-23 DIAGNOSIS — J90 Pleural effusion, not elsewhere classified: Secondary | ICD-10-CM | POA: Diagnosis not present

## 2021-09-25 ENCOUNTER — Other Ambulatory Visit: Payer: Self-pay

## 2021-09-25 ENCOUNTER — Ambulatory Visit (INDEPENDENT_AMBULATORY_CARE_PROVIDER_SITE_OTHER): Payer: PPO | Admitting: Thoracic Surgery (Cardiothoracic Vascular Surgery)

## 2021-09-25 DIAGNOSIS — J9383 Other pneumothorax: Secondary | ICD-10-CM | POA: Diagnosis not present

## 2021-09-25 NOTE — Progress Notes (Signed)
°   °  [address]       [place] [address]             [phone]       Patient: Home Provider: Office Consent for Telemedicine visit obtained.  Todays visit was completed via a real-time telehealth (see specific modality noted below). The patient/authorized person provided oral consent at the time of the visit to engage in a telemedicine encounter with the present provider at [facility]. The patient/authorized person was informed of the potential benefits, limitations, and risks of telemedicine. The patient/authorized person expressed understanding that the laws that protect confidentiality also apply to telemedicine. The patient/authorized person acknowledged understanding that telemedicine does not provide emergency services and that he or she would need to call 911 or proceed to the nearest hospital for help if such a need arose.   Total time spent in the clinical discussion 10 minutes.  Telehealth Modality: Phone visit (audio only)  I had a telephone visit with Mr. Cozzolino.  He was originally seen for a spontaneous pneumothorax.  This has resolved on most recent CXR.  He is clear from a surgical standpoint.  Raisha Brabender Bary Leriche

## 2021-10-16 ENCOUNTER — Encounter: Payer: Self-pay | Admitting: Family Medicine

## 2021-10-16 ENCOUNTER — Ambulatory Visit (INDEPENDENT_AMBULATORY_CARE_PROVIDER_SITE_OTHER): Payer: PPO | Admitting: Family Medicine

## 2021-10-16 ENCOUNTER — Other Ambulatory Visit: Payer: Self-pay

## 2021-10-16 VITALS — BP 130/74 | HR 78 | Temp 97.3°F | Ht 73.0 in | Wt 193.6 lb

## 2021-10-16 DIAGNOSIS — Q809 Congenital ichthyosis, unspecified: Secondary | ICD-10-CM | POA: Diagnosis not present

## 2021-10-16 DIAGNOSIS — I10 Essential (primary) hypertension: Secondary | ICD-10-CM

## 2021-10-16 DIAGNOSIS — R29898 Other symptoms and signs involving the musculoskeletal system: Secondary | ICD-10-CM | POA: Diagnosis not present

## 2021-10-16 DIAGNOSIS — G629 Polyneuropathy, unspecified: Secondary | ICD-10-CM | POA: Diagnosis not present

## 2021-10-16 MED ORDER — GABAPENTIN 100 MG PO CAPS: 100.0000 mg | ORAL_CAPSULE | Freq: Every day | ORAL | 3 refills | Status: DC

## 2021-10-16 NOTE — Assessment & Plan Note (Signed)
Recommended twice daily emollients.

## 2021-10-16 NOTE — Assessment & Plan Note (Signed)
Secondary to neuropathy and nerve impingement as above.  He will follow-up with physical therapy.  He will let me know if he would like to have a referral to see a physiatrist or neurosurgeon.

## 2021-10-16 NOTE — Progress Notes (Signed)
° °  Kirk [name] is a 81 y.o. male who presents today for an office visit.  Assessment/Plan:  New/Acute Problems: Pneumothorax Resolved.  Discussed reasons return to care.  Chronic Problems Addressed Today: Xeroderma Recommended twice daily emollients.  Neuropathy ([place]) He has not yet followed up with a neurosurgeon.  He does not wish to have surgery at this point.  We had MRI done a few months ago which showed several areas of nerve impingement and spinal stenosis.  We will be starting low-dose gabapentin today.  He has had well with this in the past.  We will also place referral to physical therapy.  He will let me know if he would like to see him a neurosurgeon or physiatrist.  Essential hypertension At goal on lisinopril-HCTZ 20-12.5 half tablet once daily.  Bilateral leg weakness Secondary to neuropathy and nerve impingement as above.  He will follow-up with physical therapy.  He will let me know if he would like to have a referral to see a physiatrist or neurosurgeon.     Subjective:  HPI:  Patient here for follow-up.  Went to the ED on 08/24/2021 with chest pain.  Had work-up in ED including chest x-ray which showed small left-sided spontaneous pneumothorax.  Cardiothoracic surgery was consulted.  Chest x-ray was repeated in the ED with some improvement in size.  He was discharged home to follow-up with cardiothoracic surgery.  He has done well since being home.   See A/P for status of  chronic conditions.       Objective:  Physical Exam: BP 130/74 (BP Location: Left Arm, Patient Position: Sitting, Cuff Size: Normal)    Pulse 78    Temp (!) 97.3 F (36.3 C) (Temporal)    Ht 6\' 1"  (1.854 m)    Wt 193 lb 9.6 oz (87.8 kg)    SpO2 98%    BMI 25.54 kg/m   Gen: No acute distress, resting comfortably CV: Regular rate and rhythm with no murmurs appreciated Pulm: Normal work of breathing, clear to auscultation bilaterally with no crackles, wheezes, or rhonchi Neuro: Grossly  normal, moves all extremities Psych: Normal affect and thought content  Time Spent: 45 minutes of total time was spent on the date of the encounter performing the following actions: chart review prior to seeing the patient including recent ED visit and specialist visits, obtaining history, performing a medically necessary exam, counseling on the treatment plan, placing orders, and documenting in our EHR.        Algis Greenhouse. Jerline Pain, MD 10/16/2021 3:20 PM

## 2021-10-16 NOTE — Assessment & Plan Note (Signed)
At goal on lisinopril-HCTZ 20-12.5 half tablet once daily.

## 2021-10-16 NOTE — Assessment & Plan Note (Signed)
He has not yet followed up with a neurosurgeon.  He does not wish to have surgery at this point.  We had MRI done a few months ago which showed several areas of nerve impingement and spinal stenosis.  We will be starting low-dose gabapentin today.  He has had well with this in the past.  We will also place referral to physical therapy.  He will let me know if he would like to see him a neurosurgeon or physiatrist.

## 2021-10-16 NOTE — Patient Instructions (Addendum)
It was very nice to see you today!  I will refer you to see therapy.   Please start gabapentin 100mg  nightly.   Send me a message in a few weeks to let me knuow how you are doing.   Please come back in 3-6 months or sooner if needed.   Take care, Dr Jerline Pain  PLEASE NOTE:  If you had any lab tests please let us know if you have not heard back within a few days. You may see your results on mychart before we have a chance to review them but we will give you a call once they are reviewed by [place]. If we ordered any referrals today, please let us know if you have not heard from their office within the next week.   Please try these tips to maintain a healthy lifestyle:  Eat at least 3 REAL meals and 1-2 snacks per day.  Aim for no more than 5 hours between eating.  If you eat breakfast, please do so within one hour of getting up.   Each meal should contain half fruits/vegetables, one quarter protein, and one quarter carbs (no bigger than a computer mouse)  Cut down on sweet beverages. This includes juice, soda, and sweet tea.   Drink at least 1 glass of water with each meal and aim for at least 8 glasses per day  Exercise at least 150 minutes every week.

## 2021-10-25 ENCOUNTER — Other Ambulatory Visit: Payer: Self-pay | Admitting: Family Medicine

## 2021-11-06 NOTE — Therapy (Signed)
°OUTPATIENT PHYSICAL THERAPY THORACOLUMBAR EVALUATION ° ° °Patient Name: Kirk [name] °MRN: [id number] °DOB:04/07/1941, 80 y.o., male °Today's Date: 11/09/2021 ° ° PT End of Session - 11/09/21 1249   ° ° Visit Number 1   ° Number of Visits 21   ° Date for PT Re-Evaluation 02/07/22   ° Authorization Type HTA   ° PT Start Time 1300   ° PT Stop Time 1345   ° PT Time Calculation (min) 45 min   ° Activity Tolerance Patient tolerated treatment well   ° Behavior During Therapy WFL for tasks assessed/performed   ° °  °  ° °  ° ° °Past Medical History:  °Diagnosis Date  ° Cataract   ° Congenital glaucoma   ° "both eyes operated on when I was 15 months old"  ° Cough   ° Disturbance of skin sensation   ° Diverticulosis of colon 2014  ° Diverticulum of esophagus, acquired   ° Dysphagia, unspecified(787.20)   ° Encounter for long-term (current) use of other medications   ° Herpes zoster without mention of complication   ° Hyperglycemia   ° patient denies  ° Hyperlipidemia   ° Hypertension   ° Hypertrophy of prostate without urinary obstruction and other lower urinary tract symptoms (LUTS)   ° Lumbago   ° Nevus, non-neoplastic   ° Osteoarthrosis, unspecified whether generalized or localized, unspecified site   ° pt. denies  ° Other premature beats   ° Pain in limb   ° Plantar fascial fibromatosis   ° Special screening for malignant neoplasm of prostate   ° Unspecified glaucoma(365.9)   ° Unspecified pruritic disorder   ° Unspecified vitamin D deficiency   ° Urinary frequency   ° Zenker's diverticulum   ° °Past Surgical History:  °Procedure Laterality Date  ° CATARACT EXTRACTION BILATERAL W/ ANTERIOR VITRECTOMY Bilateral 1977  ° COLONOSCOPY  09/06/2013  ° [place]  ° DIRECT LARYNGOSCOPY  10/22/2015  ° Cervical esophagoscopy.  Endoscopic esophageal diverticulotomy.   ° FLEXIBLE SIGMOIDOSCOPY  1999  ° GLAUCOMA SURGERY  1945  ° congenital glaucoma  ° HERNIA REPAIR  1993  ° INCISIONAL HERNIA REPAIR  2008  ° LARYNGOSCOPY    ° with stapling of  zenkers diverticulum  ° LUMBAR LAMINECTOMY/ DECOMPRESSION WITH MET-RX Left 08/16/2019  ° Procedure: Left Lumbar Two-Three Minimally Invasive Laminectomy and Microdiscectomy;  Surgeon: Ostergard, Thomas A, MD;  Location: [facility] OR;  Service: Neurosurgery;  Laterality: Left;  Left Lumbar 2-3 Minimally invasive laminectomy and microdiscectomy  ° °Patient Active Problem List  ° Diagnosis Date Noted  ° Central stenosis of spinal canal 05/26/2021  ° Actinic keratosis 06/16/2020  ° Bilateral leg weakness 06/16/2020  ° Onychomycosis 12/13/2019  ° Herniated lumbar intervertebral disc 08/16/2019  ° Degenerative lumbar spinal stenosis 02/14/2019  ° TSH elevation 02/25/2016  ° Orthostatic hypotension 02/17/2016  ° Syncope 01/07/2016  ° Esophageal diverticulum, acquired 10/22/2015  ° Xeroderma 08/20/2015  ° Bifascicular block 08/20/2015  ° Neuropathy ([facility]) 02/05/2015  ° Diverticulosis of colon   ° Vitamin D deficiency   ° Hyperglycemia   ° Dysphagia   ° Diverticulum of esophagus, acquired   ° Hyperlipidemia   ° Glaucoma   ° BPH (benign prostatic hyperplasia)   ° Essential hypertension 02/01/2013  ° 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  ° ° °PCP: Parker, Caleb M, MD ° °REFERRING PROVIDER: Parker, Caleb M, MD ° °REFERRING DIAG: R29.898 (ICD-10-CM) - Bilateral leg weakness ° °THERAPY DIAG:  °Pain, lumbar region - Plan: PT plan of care   cert/re-cert ° °Difficulty walking - Plan: PT plan of care cert/re-cert ° °Muscle weakness (generalized) - Plan: PT plan of care cert/re-cert ° °ONSET DATE: 2020 ° °SUBJECTIVE:                                                                                                                                                                                          ° °SUBJECTIVE STATEMENT: °Pt is well known to PT. He has been seen at [facility] for PT for over 1.5 years. He has been having lumbar stenosis issues since 2020. He states that his legs are getting weaker. He cannot stand or walk with the rollator.  ° °Pt states it  is mostly leg weakness vs pain. It is from the knees down. Pt does have neuropathy in bilateral legs. Pt states that transfers and walking are difficult to the weakness. He can walk but not very far without the walker. He is still able to bathe shower and walk. Wife reports it is struggle. Pt showers with shower chair. Pt states he notices weakness into the quads and calves feet. Pt does notice NT into the legs.  ° °Pt states that after surgery, he continued to have bilat LE pain coming from his back issues. He report stenosis in the legs that are getting worse. Wife states that walking is dangerous due to lacking depth perception and peripheral.  ° ° ° °PERTINENT HISTORY:  °Congenital glaucoma, Nov 2020 disc herniation surgery, vision deficits- lack of depth perception, and peripheral vision ° °PAIN:  °Are you having pain? No ° ° °PRECAUTIONS: None ° °WEIGHT BEARING RESTRICTIONS No ° °FALLS:  °Has patient fallen in last 6 months? No, Number of falls: 0 ° °LIVING ENVIRONMENT: °Lives with: lives with their family and lives with their spouse °Lives in: House/apartment °Stairs: Yes; 5- cane on steps, rail on the L  °Has following equipment at home: Single point cane and Walker - 4 wheeled ° °OCCUPATION: retired  ° °PLOF: Independent with household mobility with device ° °PATIENT GOALS : improve LE weakness. "Walk one day without the walker" ° ° °OBJECTIVE:  ° °DIAGNOSTIC FINDINGS:  °Lumbar spine: °  °1. Persistent prominent left subarticular zone inferiorly migrated °extrusion at L2-L3 with severe spinal canal stenosis and impingement °of the cauda equina nerve roots, particularly on the left. The °extrusion is slightly smaller compared to the study from 2020. °2. Left subarticular zone protrusion at L1-L2 with contact and °possible impingement of the traversing left L2 nerve root, similar °to the prior study. Otherwise, no high-grade spinal canal or neural °foraminal stenosis in the lumbar spine. °3. Multilevel facet  arthropathy, most advanced   at L4-L5, with °bilateral facet joint effusions and perifacetal soft tissue edema at °L2-L3 through L4-L5. °4. Degenerative marrow signal abnormality at L1 through L3, °progressed since 2020. ° °PATIENT SURVEYS:  °FOTO N/A ° °SCREENING FOR RED FLAGS: °Bowel or bladder incontinence: No °Spinal tumors: No °Cauda equina syndrome: No °Compression fracture: No ° ° °COGNITION: ° Overall cognitive status: Within functional limits for tasks assessed   °  °SENSATION: ° Light touch: Deficits   °  ° ° °POSTURE:  °Kyphotic, and decrease lumbar lordosis  ° °PALPATION: °Significant atrophy in bilat thigh and shanks  ° °LUMBARAROM/PROM: requires UE support in seated  ° °A/PROM A/PROM  °11/09/2021  °Flexion 50%  °Extension 0%  °Right lateral flexion 25%  °Left lateral flexion 25%  °Right rotation 50%  °Left rotation 50%  ° (Blank rows = not tested) ° °LE AROM/PROM: moderate limitations into hip flexion, ER, and IR; bilat knees WFL- lacking full TKE but symmetrical bilaterally ° °LE MMT: ° °MMT Right °11/09/2021 Left °11/09/2021  °Hip flexion 4+/5 4+/5  °Hip extension    °Hip abduction 4+/5 4+/5  °Hip adduction 4+/5 4+/5  °Hip internal rotation    °Hip external rotation    °Knee flexion 4+/5 4+/5  °Knee extension 5/5 5/5  °Ankle dorsiflexion 4/5 4/5  °Ankle plantarflexion 3/5 3/5  ° (Blank rows = not tested) ° ° °FUNCTIONAL TESTS:  °TUG: 21s °STS transfers: bilat UE require- one for push off and one for balance °Standing balance: pt unable to perform 4 stage balance or marching without UE assistance ° °GAIT: °Distance walked: 6MWT 655ft  °Assistive device utilized: Walker - 4 wheeled °Level of assistance: Modified independence °Comments: increased fwd trunk lean and UE pressure through walker as fatigue increased, cross step/ stumble with fatigue ° ° ° °TODAY'S TREATMENT  °Review of previously provided HEP- increasing daily walking duration °LAQ, hip ABD, seated marching, seated HR/TR ° ° °PATIENT EDUCATION:   °Education details: MOI, diagnosis, prognosis, anatomy, exercise progression, DOMS expectations, muscle firing,  envelope of function, HEP, POC ° °Person educated: Patient °Education method: Explanation, Demonstration, Tactile cues, Verbal cues, and Handouts °Education comprehension: verbalized understanding, returned demonstration, verbal cues required, and tactile cues required ° ° °HOME EXERCISE PROGRAM: °Utilize previous HEP printouts ° °ASSESSMENT: ° °CLINICAL IMPRESSION: °Patient is a 80 y.o. male who was seen today for physical therapy evaluation and treatment for cc of LE weakness. Pt's s/s appear consistent with history of lumbar stenosis. Pt is largely strength limited at this time and has functional mobility deficits as demonstrated by 6MWT and standing balance tests. Objective impairments include Abnormal gait, decreased activity tolerance, decreased balance, decreased endurance, decreased knowledge of use of DME, decreased mobility, difficulty walking, decreased ROM, decreased strength, hypomobility, impaired flexibility, impaired sensation, improper body mechanics, and postural dysfunction. These impairments are limiting patient from cleaning, community activity, driving, meal prep, occupation, laundry, yard work, and shopping. Personal factors including Age, Behavior pattern, Fitness, Past/current experiences, Time since onset of injury/illness/exacerbation, and 3+ comorbidities:    are also affecting patient's functional outcome. Patient will benefit from skilled PT to address above impairments and improve overall function. ° °REHAB POTENTIAL: Fair   ° °CLINICAL DECISION MAKING: Evolving/moderate complexity ° °EVALUATION COMPLEXITY: Low ° ° °GOALS: ° ° °SHORT TERM GOALS: ° °STG Name Target Date Goal status  °1 Pt will become independent with HEP in order to demonstrate synthesis of PT education. ° °Baseline:  12/21/2021 INITIAL  °2 Pt will be able to demonstrate STS without UE support   in order to  demonstrate functional improvement in LE function for self-care and house hold duties. ° °Baseline:  12/21/2021 INITIAL  °3 Pt will be able to walk at least 1200 ft during 6MWT in order to demonstrate improvement in functional endurance and mobility. ° °Baseline: 12/21/2021 INITIAL  ° °LONG TERM GOALS:  ° °LTG Name Target Date Goal status  °1 Pt will become independent with HEP in order to demonstrate synthesis of PT education.  °Baseline: 02/01/2022 INITIAL  °2 Pt will be able to demonstrate TUG in under 10 sec in order to demonstrate functional improvement in LE function, strength, balance, and mobility for safety with community ambulation. ° °Baseline: 02/01/2022 INITIAL  °3 Pt will be able to demonstrate/report ability to walk >10 mins without LE pain in order to demonstrate functional improvement and tolerance to exercise and community mobility.  ° °Baseline: 02/01/2022 INITIAL  °4 Pt will be able to walk at least 3280 ft or 1000m during 6MWT in order to demonstrate improvement in functional endurance and mobility for decrease in all cause mortality.  °Baseline: 02/01/2022 INITIAL  ° °PLAN: °PT FREQUENCY: 1-2x/week ° °PT DURATION: 12 weeks ° °PLANNED INTERVENTIONS: Therapeutic exercises, Therapeutic activity, Neuro Muscular re-education, Balance training, Gait training, Patient/Family education, Joint mobilization, Stair training, DME instructions, Aquatic Therapy, Dry Needling, Electrical stimulation, Spinal mobilization, Cryotherapy, Moist heat, scar mobilization, Splintting, Taping, Vasopneumatic device, Traction, Ultrasound, Ionotophoresis 4mg/ml Dexamethasone, and Manual therapy ° °PLAN FOR NEXT SESSION: intro to aquatics- will need assistance in water; LE strengthening due to stenosis, endurance training ° ° °Alan Zhou, PT °11/09/2021, 3:53 PM  °

## 2021-11-09 ENCOUNTER — Encounter (HOSPITAL_BASED_OUTPATIENT_CLINIC_OR_DEPARTMENT_OTHER): Payer: Self-pay | Admitting: Physical Therapy

## 2021-11-09 ENCOUNTER — Ambulatory Visit (HOSPITAL_BASED_OUTPATIENT_CLINIC_OR_DEPARTMENT_OTHER): Payer: PPO | Attending: Family Medicine | Admitting: Physical Therapy

## 2021-11-09 ENCOUNTER — Other Ambulatory Visit: Payer: Self-pay

## 2021-11-09 DIAGNOSIS — M6281 Muscle weakness (generalized): Secondary | ICD-10-CM | POA: Diagnosis not present

## 2021-11-09 DIAGNOSIS — R262 Difficulty in walking, not elsewhere classified: Secondary | ICD-10-CM | POA: Diagnosis not present

## 2021-11-09 DIAGNOSIS — R29898 Other symptoms and signs involving the musculoskeletal system: Secondary | ICD-10-CM | POA: Diagnosis not present

## 2021-11-09 DIAGNOSIS — M545 Low back pain, unspecified: Secondary | ICD-10-CM | POA: Insufficient documentation

## 2021-11-17 ENCOUNTER — Ambulatory Visit (HOSPITAL_BASED_OUTPATIENT_CLINIC_OR_DEPARTMENT_OTHER): Payer: PPO | Admitting: Physical Therapy

## 2021-11-17 ENCOUNTER — Other Ambulatory Visit: Payer: Self-pay

## 2021-11-17 ENCOUNTER — Encounter (HOSPITAL_BASED_OUTPATIENT_CLINIC_OR_DEPARTMENT_OTHER): Payer: Self-pay | Admitting: Physical Therapy

## 2021-11-17 DIAGNOSIS — M6281 Muscle weakness (generalized): Secondary | ICD-10-CM

## 2021-11-17 DIAGNOSIS — R262 Difficulty in walking, not elsewhere classified: Secondary | ICD-10-CM

## 2021-11-17 DIAGNOSIS — M545 Low back pain, unspecified: Secondary | ICD-10-CM | POA: Diagnosis not present

## 2021-11-17 NOTE — Therapy (Signed)
OUTPATIENT PHYSICAL THERAPY TREATMENT NOTE   Patient Name: Kirk [name] MRN: [id number] DOB:Jun 06, 1941, 81 y.o., male Today's Date: 11/24/2021  PCP: [name], MD REFERRING PROVIDER: [name], MD     Past Medical History:  Diagnosis Date   Cataract    Congenital glaucoma    "both eyes operated on when I was 60 months old"   Cough    Disturbance of skin sensation    Diverticulosis of colon 2014   Diverticulum of esophagus, acquired    Dysphagia, unspecified(787.20)    Encounter for long-term (current) use of other medications    Herpes zoster without mention of complication    Hyperglycemia    patient denies   Hyperlipidemia    Hypertension    Hypertrophy of prostate without urinary obstruction and other lower urinary tract symptoms (LUTS)    Lumbago    Nevus, non-neoplastic    Osteoarthrosis, unspecified whether generalized or localized, unspecified site    pt. denies   Other premature beats    Pain in limb    Plantar fascial fibromatosis    Special screening for malignant neoplasm of prostate    Unspecified glaucoma(365.9)    Unspecified pruritic disorder    Unspecified vitamin D deficiency    Urinary frequency    Zenker's diverticulum    Past Surgical History:  Procedure Laterality Date   CATARACT EXTRACTION BILATERAL W/ ANTERIOR VITRECTOMY Bilateral 1977   COLONOSCOPY  09/06/2013   [name]   DIRECT LARYNGOSCOPY  10/22/2015   Cervical esophagoscopy.  Endoscopic esophageal diverticulotomy.    [place]   congenital glaucoma   HERNIA REPAIR  1993   INCISIONAL HERNIA REPAIR  2008   LARYNGOSCOPY     with stapling of zenkers diverticulum   LUMBAR LAMINECTOMY/ DECOMPRESSION WITH MET-RX Left 08/16/2019   Procedure: Left Lumbar Two-Three Minimally Invasive Laminectomy and Microdiscectomy;  Surgeon: Judith Part, MD;  Location: [facility];  Service: Neurosurgery;  Laterality: Left;  Left Lumbar 2-3 Minimally  invasive laminectomy and microdiscectomy   Patient Active Problem List   Diagnosis Date Noted   Central stenosis of spinal canal 05/26/2021   Actinic keratosis 06/16/2020   Bilateral leg weakness 06/16/2020   Onychomycosis 12/13/2019   Herniated lumbar intervertebral disc 08/16/2019   Degenerative lumbar spinal stenosis 02/14/2019   TSH elevation 02/25/2016   Orthostatic hypotension 02/17/2016   Syncope 01/07/2016   Esophageal diverticulum, acquired 10/22/2015   Xeroderma 08/20/2015   Bifascicular block 08/20/2015   Neuropathy ([place]) 02/05/2015   Diverticulosis of colon    Vitamin D deficiency    Hyperglycemia    Dysphagia    Diverticulum of esophagus, acquired    Hyperlipidemia    Glaucoma    BPH (benign prostatic hyperplasia)    Essential hypertension 02/01/2013   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    REFERRING DIAG: R29.898 (ICD-10-CM) - Bilateral leg weakness  THERAPY DIAG:  Pain, lumbar region  Difficulty walking  Muscle weakness (generalized)  PERTINENT HISTORY: Congenital glaucoma, Nov 2020 disc herniation surgery, vision deficits- lack of depth perception, and peripheral vision  PRECAUTIONS: none  SUBJECTIVE: "I really can't see, Im a little nervous"  PAIN:  Are you having pain? No   PRECAUTIONS: None  WEIGHT BEARING RESTRICTIONS No  FALLS:  Has patient fallen in last 6 months? No, Number of falls: 0  LIVING ENVIRONMENT: Lives with: lives with their family and lives with their spouse Lives in: House/apartment Stairs: Yes; 5- cane on steps,  rail on the L  Has following equipment at home: Single point cane and Walker - 4 wheeled  OCCUPATION: retired   PLOF: Independent with household mobility with device  PATIENT GOALS : improve LE weakness. "Walk one day without the walker"   OBJECTIVE:   DIAGNOSTIC FINDINGS:  Lumbar spine:   1. Persistent prominent left subarticular zone inferiorly migrated extrusion at L2-L3 with severe spinal canal stenosis  and impingement of the cauda equina nerve roots, particularly on the left. The extrusion is slightly smaller compared to the study from 2020. 2. Left subarticular zone protrusion at L1-L2 with contact and possible impingement of the traversing left L2 nerve root, similar to the prior study. Otherwise, no high-grade spinal canal or neural foraminal stenosis in the lumbar spine. 3. Multilevel facet arthropathy, most advanced at L4-L5, with bilateral facet joint effusions and perifacetal soft tissue edema at L2-L3 through L4-L5. 4. Degenerative marrow signal abnormality at L1 through L3, progressed since 2020.  PATIENT SURVEYS:  FOTO N/A  SCREENING FOR RED FLAGS: Bowel or bladder incontinence: No Spinal tumors: No Cauda equina syndrome: No Compression fracture: No   COGNITION:  Overall cognitive status: Within functional limits for tasks assessed     SENSATION:  Light touch: Deficits      POSTURE:  Kyphotic, and decrease lumbar lordosis   PALPATION: Significant atrophy in bilat thigh and shanks   LUMBARAROM/PROM: requires UE support in seated   A/PROM A/PROM  11/09/2021  Flexion 50%  Extension 0%  Right lateral flexion 25%  Left lateral flexion 25%  Right rotation 50%  Left rotation 50%   (Blank rows = not tested)  LE AROM/PROM: moderate limitations into hip flexion, ER, and IR; bilat knees WFL- lacking full TKE but symmetrical bilaterally  LE MMT:  MMT Right 11/09/2021 Left 11/09/2021  Hip flexion 4+/5 4+/5  Hip extension    Hip abduction 4+/5 4+/5  Hip adduction 4+/5 4+/5  Hip internal rotation    Hip external rotation    Knee flexion 4+/5 4+/5  Knee extension 5/5 5/5  Ankle dorsiflexion 4/5 4/5  Ankle plantarflexion 3/5 3/5   (Blank rows = not tested)   FUNCTIONAL TESTS:  TUG: 21s STS transfers: bilat UE require- one for push off and one for balance Standing balance: pt unable to perform 4 stage balance or marching without UE  assistance  GAIT: Distance walked: 6MWT 664f  Assistive device utilized: WEnvironmental consultant- 4 wheeled Level of assistance: Modified independence Comments: increased fwd trunk lean and UE pressure through walker as fatigue increased, cross step/ stumble with fatigue    TODAY'S TREATMENT  Pt seen for aquatic therapy today.  Treatment took place in water 3.25-4.8 ft [facility] Temp of water was 91.  Pt entered/exited the pool via stairs step to pattern independently with bilat rail.   Reviewed current function, pain levels, response to prior Rx, and HEP compliance.   Pt introduced to setting Walking using yellow noodle all depths Fwd, back and side stepping Pt edu on properties of water and benefits of aquatic therapy  Standing holding to wall -df; pf 2x15 -slow marching; add/abd; hip extension 2x10  Instruction on abdominal bracing -kick board push down 2x10 -hip hinging 2x10 -lumbar rotation 2x10    Pt requires buoyancy for support and to offload joints with strengthening exercises. Viscosity of the water is needed for resistance of strengthening; water current perturbations provides challenge to standing balance unsupported, requiring increased core activation.  PATIENT EDUCATION:  Education details: MOI, diagnosis, prognosis, anatomy, exercise progression, DOMS expectations, muscle firing,  envelope of function, HEP, POC  Person educated: Patient Education method: Explanation, Demonstration, Tactile cues, Verbal cues, and Handouts Education comprehension: verbalized understanding, returned demonstration, verbal cues required, and tactile cues required   HOME EXERCISE PROGRAM: Utilize previous HEP printouts  ASSESSMENT:  CLINICAL IMPRESSION: Pt apprehensive with initial submersion.  CGA and TC improve confidence.  Pt with cues improves upright posture with amb.  Introduced activity/exercises with pt holding to pool wall. Began strengthening and  stretching of LE and core.  He appeared to enjoy session once confidence increased.  Wife present, good support for pt. Pt requiring 2 rest periods.  No increased sx throughout.  He is a good candidate for aquatic therapy to enhance and facilitate progression towards goals benefiting from therapeutic warm for relaxation of muscles, buoyancy and viscosity for improved ability for strengthening in protected environment from falls.  Patient is a 81 y.o. male who was seen today for physical therapy evaluation and treatment for cc of LE weakness. Pt's s/s appear consistent with history of lumbar stenosis. Pt is largely strength limited at this time and has functional mobility deficits as demonstrated by 6MWT and standing balance tests. Objective impairments include Abnormal gait, decreased activity tolerance, decreased balance, decreased endurance, decreased knowledge of use of DME, decreased mobility, difficulty walking, decreased ROM, decreased strength, hypomobility, impaired flexibility, impaired sensation, improper body mechanics, and postural dysfunction. These impairments are limiting patient from cleaning, community activity, driving, meal prep, occupation, laundry, yard work, and shopping. Personal factors including Age, Behavior pattern, Fitness, Past/current experiences, Time since onset of injury/illness/exacerbation, and 3+ comorbidities:  are also affecting patient's functional outcome. Patient will benefit from skilled PT to address above impairments and improve overall function.  REHAB POTENTIAL: Fair   CLINICAL DECISION MAKING: Evolving/moderate complexity  EVALUATION COMPLEXITY: Low   GOALS:   SHORT TERM GOALS:  STG Name Target Date Goal status  1 Pt will become independent with HEP in order to demonstrate synthesis of PT education.  Baseline:  12/21/2021 INITIAL  2 Pt will be able to demonstrate STS without UE support in order to demonstrate functional improvement in LE function for  self-care and house hold duties.  Baseline:  12/21/2021 INITIAL  3 Pt will be able to walk at least 1200 ft during 6MWT in order to demonstrate improvement in functional endurance and mobility.  Baseline: 12/21/2021 INITIAL   LONG TERM GOALS:   LTG Name Target Date Goal status  1 Pt will become independent with HEP in order to demonstrate synthesis of PT education.  Baseline: 02/01/2022 INITIAL  2 Pt will be able to demonstrate TUG in under 10 sec in order to demonstrate functional improvement in LE function, strength, balance, and mobility for safety with community ambulation.  Baseline: 02/01/2022 INITIAL  3 Pt will be able to demonstrate/report ability to walk >10 mins without LE pain in order to demonstrate functional improvement and tolerance to exercise and community mobility.   Baseline: 02/01/2022 INITIAL  4 Pt will be able to walk at least 3280 ft or 1027mduring 6MWT in order to demonstrate improvement in functional endurance and mobility for decrease in all cause mortality.  Baseline: 02/01/2022 INITIAL   PLAN: PT FREQUENCY: 1-2x/week  PT DURATION: 12 weeks  PLANNED INTERVENTIONS: Therapeutic exercises, Therapeutic activity, Neuro Muscular re-education, Balance training, Gait training, Patient/Family education, Joint mobilization, Stair training, DME instructions, Aquatic Therapy, Dry Needling, Electrical stimulation, Spinal mobilization, Cryotherapy,  Moist heat, scar mobilization, Splintting, Taping, Vasopneumatic device, Traction, Ultrasound, Ionotophoresis 22m/ml Dexamethasone, and Manual therapy  PLAN FOR NEXT SESSION: intro to aquatics- will need assistance in water; LE strengthening due to stenosis, endurance training       MStanton Kidney([place]ee[place] Janaiyah Blackard MPT 11/24/2021, 10:00 AM

## 2021-11-24 ENCOUNTER — Encounter (HOSPITAL_BASED_OUTPATIENT_CLINIC_OR_DEPARTMENT_OTHER): Payer: Self-pay | Admitting: Physical Therapy

## 2021-11-24 ENCOUNTER — Other Ambulatory Visit: Payer: Self-pay

## 2021-11-24 ENCOUNTER — Ambulatory Visit (HOSPITAL_BASED_OUTPATIENT_CLINIC_OR_DEPARTMENT_OTHER): Payer: PPO | Admitting: Physical Therapy

## 2021-11-24 DIAGNOSIS — M6281 Muscle weakness (generalized): Secondary | ICD-10-CM

## 2021-11-24 DIAGNOSIS — M545 Low back pain, unspecified: Secondary | ICD-10-CM

## 2021-11-24 DIAGNOSIS — R262 Difficulty in walking, not elsewhere classified: Secondary | ICD-10-CM

## 2021-11-24 NOTE — Therapy (Signed)
OUTPATIENT PHYSICAL THERAPY TREATMENT NOTE   Patient Name: Kirk [name] MRN: [id number] DOB:[birth year], 81 y.o., male Today's Date: 11/24/2021  PCP: [name], MD REFERRING PROVIDER: [name], MD   PT End of Session - 11/24/21 1615     Visit Number 3    Number of Visits 21    Date for PT Re-Evaluation 02/07/22    PT Start Time 7846    PT Stop Time 9629    PT Time Calculation (min) 43 min              Past Medical History:  Diagnosis Date   Cataract    Congenital glaucoma    "both eyes operated on when I was 39 months old"   Cough    Disturbance of skin sensation    Diverticulosis of colon 2014   Diverticulum of esophagus, acquired    Dysphagia, unspecified(787.20)    Encounter for long-term (current) use of other medications    Herpes zoster without mention of complication    Hyperglycemia    patient denies   Hyperlipidemia    Hypertension    Hypertrophy of prostate without urinary obstruction and other lower urinary tract symptoms (LUTS)    Lumbago    Nevus, non-neoplastic    Osteoarthrosis, unspecified whether generalized or localized, unspecified site    pt. denies   Other premature beats    Pain in limb    Plantar fascial fibromatosis    Special screening for malignant neoplasm of prostate    Unspecified glaucoma(365.9)    Unspecified pruritic disorder    Unspecified vitamin D deficiency    Urinary frequency    Zenker's diverticulum    Past Surgical History:  Procedure Laterality Date   CATARACT EXTRACTION BILATERAL W/ ANTERIOR VITRECTOMY Bilateral 1977   COLONOSCOPY  09/06/2013   [name]   DIRECT LARYNGOSCOPY  10/22/2015   Cervical esophagoscopy.  Endoscopic esophageal diverticulotomy.    [place]   congenital glaucoma   HERNIA REPAIR  1993   INCISIONAL HERNIA REPAIR  2008   LARYNGOSCOPY     with stapling of zenkers diverticulum   LUMBAR LAMINECTOMY/ DECOMPRESSION WITH MET-RX Left  08/16/2019   Procedure: Left Lumbar Two-Three Minimally Invasive Laminectomy and Microdiscectomy;  Surgeon: Judith Part, MD;  Location: [place];  Service: Neurosurgery;  Laterality: Left;  Left Lumbar 2-3 Minimally invasive laminectomy and microdiscectomy   Patient Active Problem List   Diagnosis Date Noted   Central stenosis of spinal canal 05/26/2021   Actinic keratosis 06/16/2020   Bilateral leg weakness 06/16/2020   Onychomycosis 12/13/2019   Herniated lumbar intervertebral disc 08/16/2019   Degenerative lumbar spinal stenosis 02/14/2019   TSH elevation 02/25/2016   Orthostatic hypotension 02/17/2016   Syncope 01/07/2016   Esophageal diverticulum, acquired 10/22/2015   Xeroderma 08/20/2015   Bifascicular block 08/20/2015   Neuropathy ([place]) 02/05/2015   Diverticulosis of colon    Vitamin D deficiency    Hyperglycemia    Dysphagia    Diverticulum of esophagus, acquired    Hyperlipidemia    Glaucoma    BPH (benign prostatic hyperplasia)    Essential hypertension 02/01/2013   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    REFERRING DIAG: R29.898 (ICD-10-CM) - Bilateral leg weakness  THERAPY DIAG:  Pain, lumbar region  Difficulty walking  Muscle weakness (generalized)  PERTINENT HISTORY: Congenital glaucoma, Nov 2020 disc herniation surgery, vision deficits- lack of depth perception, and peripheral vision  PRECAUTIONS: none  SUBJECTIVE: "  I was a little soar after last session but not bad"  PAIN:  Are you having pain? No   PRECAUTIONS: None  WEIGHT BEARING RESTRICTIONS No  FALLS:  Has patient fallen in last 6 months? No, Number of falls: 0  LIVING ENVIRONMENT: Lives with: lives with their family and lives with their spouse Lives in: House/apartment Stairs: Yes; 5- cane on steps, rail on the L  Has following equipment at home: Single point cane and Walker - 4 wheeled  OCCUPATION: retired   PLOF: Independent with household mobility with device  PATIENT GOALS :  improve LE weakness. "Walk one day without the walker"   OBJECTIVE:   DIAGNOSTIC FINDINGS:  Lumbar spine:   1. Persistent prominent left subarticular zone inferiorly migrated extrusion at L2-L3 with severe spinal canal stenosis and impingement of the cauda equina nerve roots, particularly on the left. The extrusion is slightly smaller compared to the study from 2020. 2. Left subarticular zone protrusion at L1-L2 with contact and possible impingement of the traversing left L2 nerve root, similar to the prior study. Otherwise, no high-grade spinal canal or neural foraminal stenosis in the lumbar spine. 3. Multilevel facet arthropathy, most advanced at L4-L5, with bilateral facet joint effusions and perifacetal soft tissue edema at L2-L3 through L4-L5. 4. Degenerative marrow signal abnormality at L1 through L3, progressed since 2020.  PATIENT SURVEYS:  FOTO N/A  SCREENING FOR RED FLAGS: Bowel or bladder incontinence: No Spinal tumors: No Cauda equina syndrome: No Compression fracture: No   COGNITION:  Overall cognitive status: Within functional limits for tasks assessed     SENSATION:  Light touch: Deficits      POSTURE:  Kyphotic, and decrease lumbar lordosis   PALPATION: Significant atrophy in bilat thigh and shanks   LUMBARAROM/PROM: requires UE support in seated   A/PROM A/PROM  11/09/2021  Flexion 50%  Extension 0%  Right lateral flexion 25%  Left lateral flexion 25%  Right rotation 50%  Left rotation 50%   (Blank rows = not tested)  LE AROM/PROM: moderate limitations into hip flexion, ER, and IR; bilat knees WFL- lacking full TKE but symmetrical bilaterally  LE MMT:  MMT Right 11/09/2021 Left 11/09/2021  Hip flexion 4+/5 4+/5  Hip extension    Hip abduction 4+/5 4+/5  Hip adduction 4+/5 4+/5  Hip internal rotation    Hip external rotation    Knee flexion 4+/5 4+/5  Knee extension 5/5 5/5  Ankle dorsiflexion 4/5 4/5  Ankle plantarflexion 3/5 3/5    (Blank rows = not tested)   FUNCTIONAL TESTS:  TUG: 21s STS transfers: bilat UE require- one for push off and one for balance Standing balance: pt unable to perform 4 stage balance or marching without UE assistance  GAIT: Distance walked: 6MWT 649f  Assistive device utilized: Walker - 4 wheeled Level of assistance: Modified independence Comments: increased fwd trunk lean and UE pressure through walker as fatigue increased, cross step/ stumble with fatigue    TODAY'S TREATMENT  11/24/21 Pt seen for aquatic therapy today.  Treatment took place in water 3.25-4.8 ft [facility] Temp of water was 91.  Pt entered/exited the pool via stairs step to pattern independently with bilat rail.   Reviewed current function, pain levels, response to prior Rx, and HEP compliance.    Walking using yellow noodle all depths Fwd, back and side stepping   Seated -STS from bench. Instruction and demonstration. Moved to 3rd step.   -Stretching             -  Flutter kicking 3x20             -add/abd 3x20  Standing holding leaning on wall -using 2 foam hand buoy: shoulder horizontal add/abd; flex/ext; add/abd x 10     Gait training submerged ~ 50-60%.     Pt requires buoyancy for support and to offload joints with strengthening exercises. Viscosity of the water is needed for resistance of strengthening; water current perturbations provides challenge to standing balance unsupported, requiring increased core activation.    PATIENT EDUCATION:  Education details: MOI, diagnosis, prognosis, anatomy, exercise progression, DOMS expectations, muscle firing,  envelope of function, HEP, POC  Person educated: Patient Education method: Explanation, Demonstration, Tactile cues, Verbal cues, and Handouts Education comprehension: verbalized understanding, returned demonstration, verbal cues required, and tactile cues required   HOME EXERCISE PROGRAM: Utilize previous HEP  printouts  ASSESSMENT:  CLINICAL IMPRESSION: Pt has good response from last session.  Worked today on balance, weight shifting and STS transfers then gait training.  Pt leans  posteriorly weightbearing on heels with STS and amb (using buoys for UE support). Verbal and tactile cues given as well as demonstration.  Manual assist which tended to create increased apprehension. Pt visual impairment may contribute with the added movement of the water on the surface.  With practice pt improves heel strike and toe off with shifting of weight more anteriorly.  He will continue to benefit from aquatic to improve with above difficulties in an environment with less fall risk.     Patient is a 81 y.o. male who was seen today for physical therapy evaluation and treatment for cc of LE weakness. Pt's s/s appear consistent with history of lumbar stenosis. Pt is largely strength limited at this time and has functional mobility deficits as demonstrated by 6MWT and standing balance tests. Objective impairments include Abnormal gait, decreased activity tolerance, decreased balance, decreased endurance, decreased knowledge of use of DME, decreased mobility, difficulty walking, decreased ROM, decreased strength, hypomobility, impaired flexibility, impaired sensation, improper body mechanics, and postural dysfunction. These impairments are limiting patient from cleaning, community activity, driving, meal prep, occupation, laundry, yard work, and shopping. Personal factors including Age, Behavior pattern, Fitness, Past/current experiences, Time since onset of injury/illness/exacerbation, and 3+ comorbidities:  are also affecting patient's functional outcome. Patient will benefit from skilled PT to address above impairments and improve overall function.  REHAB POTENTIAL: Fair   CLINICAL DECISION MAKING: Evolving/moderate complexity  EVALUATION COMPLEXITY: Low   GOALS:   SHORT TERM GOALS:  STG Name Target Date Goal  status  1 Pt will become independent with HEP in order to demonstrate synthesis of PT education.  Baseline:  12/21/2021 INITIAL  2 Pt will be able to demonstrate STS without UE support in order to demonstrate functional improvement in LE function for self-care and house hold duties.  Baseline:  12/21/2021 INITIAL  3 Pt will be able to walk at least 1200 ft during 6MWT in order to demonstrate improvement in functional endurance and mobility.  Baseline: 12/21/2021 INITIAL   LONG TERM GOALS:   LTG Name Target Date Goal status  1 Pt will become independent with HEP in order to demonstrate synthesis of PT education.  Baseline: 02/01/2022 INITIAL  2 Pt will be able to demonstrate TUG in under 10 sec in order to demonstrate functional improvement in LE function, strength, balance, and mobility for safety with community ambulation.  Baseline: 02/01/2022 INITIAL  3 Pt will be able to demonstrate/report ability to walk >10 mins without LE pain in  order to demonstrate functional improvement and tolerance to exercise and community mobility.   Baseline: 02/01/2022 INITIAL  4 Pt will be able to walk at least 3280 ft or 1051mduring 6MWT in order to demonstrate improvement in functional endurance and mobility for decrease in all cause mortality.  Baseline: 02/01/2022 INITIAL   PLAN: PT FREQUENCY: 1-2x/week  PT DURATION: 12 weeks  PLANNED INTERVENTIONS: Therapeutic exercises, Therapeutic activity, Neuro Muscular re-education, Balance training, Gait training, Patient/Family education, Joint mobilization, Stair training, DME instructions, Aquatic Therapy, Dry Needling, Electrical stimulation, Spinal mobilization, Cryotherapy, Moist heat, scar mobilization, Splintting, Taping, Vasopneumatic device, Traction, Ultrasound, Ionotophoresis 470mml Dexamethasone, and Manual therapy  PLAN FOR NEXT SESSION: intro to aquatics- will need assistance in water; LE strengthening due to stenosis, endurance  training       MaStanton KidneyFrankie) Dodi Leu MPT 11/24/2021, 6:30 PM

## 2021-12-01 ENCOUNTER — Ambulatory Visit (HOSPITAL_BASED_OUTPATIENT_CLINIC_OR_DEPARTMENT_OTHER): Payer: PPO | Admitting: Physical Therapy

## 2021-12-01 ENCOUNTER — Encounter (HOSPITAL_BASED_OUTPATIENT_CLINIC_OR_DEPARTMENT_OTHER): Payer: Self-pay | Admitting: Physical Therapy

## 2021-12-01 ENCOUNTER — Other Ambulatory Visit: Payer: Self-pay

## 2021-12-01 DIAGNOSIS — R262 Difficulty in walking, not elsewhere classified: Secondary | ICD-10-CM

## 2021-12-01 DIAGNOSIS — M545 Low back pain, unspecified: Secondary | ICD-10-CM | POA: Diagnosis not present

## 2021-12-01 DIAGNOSIS — M6281 Muscle weakness (generalized): Secondary | ICD-10-CM

## 2021-12-01 NOTE — Therapy (Signed)
OUTPATIENT PHYSICAL THERAPY TREATMENT NOTE   Patient Name: Kirk [name] MRN: [id number] DOB:05-24-1941, 81 y.o., male Today's Date: 12/01/2021  PCP: [name], MD REFERRING PROVIDER: [name], MD   PT End of Session - 12/01/21 1100     Visit Number 4    Number of Visits 21    Date for PT Re-Evaluation 02/07/22    PT Start Time 46    PT Stop Time 1140    PT Time Calculation (min) 40 min               Past Medical History:  Diagnosis Date   Cataract    Congenital glaucoma    "both eyes operated on when I was 76 months old"   Cough    Disturbance of skin sensation    Diverticulosis of colon 2014   Diverticulum of esophagus, acquired    Dysphagia, unspecified(787.20)    Encounter for long-term (current) use of other medications    Herpes zoster without mention of complication    Hyperglycemia    patient denies   Hyperlipidemia    Hypertension    Hypertrophy of prostate without urinary obstruction and other lower urinary tract symptoms (LUTS)    Lumbago    Nevus, non-neoplastic    Osteoarthrosis, unspecified whether generalized or localized, unspecified site    pt. denies   Other premature beats    Pain in limb    Plantar fascial fibromatosis    Special screening for malignant neoplasm of prostate    Unspecified glaucoma(365.9)    Unspecified pruritic disorder    Unspecified vitamin D deficiency    Urinary frequency    Zenker's diverticulum    Past Surgical History:  Procedure Laterality Date   CATARACT EXTRACTION BILATERAL W/ ANTERIOR VITRECTOMY Bilateral 1977   COLONOSCOPY  09/06/2013   [name]   DIRECT LARYNGOSCOPY  10/22/2015   Cervical esophagoscopy.  Endoscopic esophageal diverticulotomy.    [facility]   congenital glaucoma   HERNIA REPAIR  1993   INCISIONAL HERNIA REPAIR  2008   LARYNGOSCOPY     with stapling of zenkers diverticulum   LUMBAR LAMINECTOMY/ DECOMPRESSION WITH MET-RX Left  08/16/2019   Procedure: Left Lumbar Two-Three Minimally Invasive Laminectomy and Microdiscectomy;  Surgeon: Judith Part, MD;  Location: [facility];  Service: Neurosurgery;  Laterality: Left;  Left Lumbar 2-3 Minimally invasive laminectomy and microdiscectomy   Patient Active Problem List   Diagnosis Date Noted   Central stenosis of spinal canal 05/26/2021   Actinic keratosis 06/16/2020   Bilateral leg weakness 06/16/2020   Onychomycosis 12/13/2019   Herniated lumbar intervertebral disc 08/16/2019   Degenerative lumbar spinal stenosis 02/14/2019   TSH elevation 02/25/2016   Orthostatic hypotension 02/17/2016   Syncope 01/07/2016   Esophageal diverticulum, acquired 10/22/2015   Xeroderma 08/20/2015   Bifascicular block 08/20/2015   Neuropathy ([place]) 02/05/2015   Diverticulosis of colon    Vitamin D deficiency    Hyperglycemia    Dysphagia    Diverticulum of esophagus, acquired    Hyperlipidemia    Glaucoma    BPH (benign prostatic hyperplasia)    Essential hypertension 02/01/2013   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    REFERRING DIAG: R29.898 (ICD-10-CM) - Bilateral leg weakness  THERAPY DIAG:  Pain, lumbar region  Difficulty walking  Muscle weakness (generalized)  PERTINENT HISTORY: Congenital glaucoma, Nov 2020 disc herniation surgery, vision deficits- lack of depth perception, and peripheral vision  PRECAUTIONS: none  SUBJECTIVE: "I feel weak in the legs.  I did ok (after last session).  I think it'll be a slow process."   PAIN:  Are you having pain? No   PRECAUTIONS: None  WEIGHT BEARING RESTRICTIONS No  FALLS:  Has patient fallen in last 6 months? No, Number of falls: 0  LIVING ENVIRONMENT: Lives with: lives with their family and lives with their spouse Lives in: House/apartment Stairs: Yes; 5- cane on steps, rail on the L  Has following equipment at home: Single point cane and Walker - 4 wheeled  OCCUPATION: retired   PLOF: Independent with household  mobility with device  PATIENT GOALS : improve LE weakness. "Walk one day without the walker"   OBJECTIVE:   DIAGNOSTIC FINDINGS:  Lumbar spine:   1. Persistent prominent left subarticular zone inferiorly migrated extrusion at L2-L3 with severe spinal canal stenosis and impingement of the cauda equina nerve roots, particularly on the left. The extrusion is slightly smaller compared to the study from 2020. 2. Left subarticular zone protrusion at L1-L2 with contact and possible impingement of the traversing left L2 nerve root, similar to the prior study. Otherwise, no high-grade spinal canal or neural foraminal stenosis in the lumbar spine. 3. Multilevel facet arthropathy, most advanced at L4-L5, with bilateral facet joint effusions and perifacetal soft tissue edema at L2-L3 through L4-L5. 4. Degenerative marrow signal abnormality at L1 through L3, progressed since 2020.  PATIENT SURVEYS:  FOTO N/A  SCREENING FOR RED FLAGS: Bowel or bladder incontinence: No Spinal tumors: No Cauda equina syndrome: No Compression fracture: No   COGNITION:  Overall cognitive status: Within functional limits for tasks assessed     SENSATION:  Light touch: Deficits      POSTURE:  Kyphotic, and decrease lumbar lordosis   PALPATION: Significant atrophy in bilat thigh and shanks   LUMBARAROM/PROM: requires UE support in seated   A/PROM A/PROM  11/09/2021  Flexion 50%  Extension 0%  Right lateral flexion 25%  Left lateral flexion 25%  Right rotation 50%  Left rotation 50%   (Blank rows = not tested)  LE AROM/PROM: moderate limitations into hip flexion, ER, and IR; bilat knees WFL- lacking full TKE but symmetrical bilaterally  LE MMT:  MMT Right 11/09/2021 Left 11/09/2021  Hip flexion 4+/5 4+/5  Hip extension    Hip abduction 4+/5 4+/5  Hip adduction 4+/5 4+/5  Hip internal rotation    Hip external rotation    Knee flexion 4+/5 4+/5  Knee extension 5/5 5/5  Ankle dorsiflexion  4/5 4/5  Ankle plantarflexion 3/5 3/5   (Blank rows = not tested)   FUNCTIONAL TESTS:  TUG: 21s STS transfers: bilat UE require- one for push off and one for balance Standing balance: pt unable to perform 4 stage balance or marching without UE assistance  GAIT: Distance walked: 6MWT 663f  Assistive device utilized: Walker - 4 wheeled Level of assistance: Modified independence Comments: increased fwd trunk lean and UE pressure through walker as fatigue increased, cross step/ stumble with fatigue    TODAY'S TREATMENT  12/01/21 Pt seen for aquatic therapy today.  Treatment took place in water 3.25-4.5 [place] [facility] Temp of water was 91.  Pt entered/exited the pool via stairs step to pattern independently with bilat rail.   Reviewed current function, pain levels, response to prior Rx, and HEP compliance.    CGA assist for all standing exercises due to decreased balance: Walking using yellow noodle all depths Fwd,  back and side stepping (multiple reps) Cues for upright posture and increased step length Forward marching  Seated:  Sit to/from stand on bench in water x 8 reps, cues to not use back of legs on bench.   Holding onto wall:  Forward step up x 10 each leg.  Hip abdct x 10 each leg Squats x 15 Hip ext x 10 each leg (cues to keep knees straight) Hamstring curls x 5 each leg  Standing hamstring stretch with foot on step x 15 sec x 2 reps each LE  Pt requires buoyancy for support and to offload joints with strengthening exercises. Viscosity of the water is needed for resistance of strengthening; water current perturbations provides challenge to standing balance unsupported, requiring increased core activation.  11/24/21 Pt seen for aquatic therapy today.  Treatment took place in water 3.25-4.8 ft in depth at the [facility] pool. Temp of water was 91.  Pt entered/exited the pool via stairs step to pattern independently with bilat  rail.   Reviewed current function, pain levels, response to prior Rx, and HEP compliance.    Walking using yellow noodle all depths Fwd, back and side stepping   Seated -STS from bench. Instruction and demonstration. Moved to 3rd step.   -Stretching             -Flutter kicking 3x20             -add/abd 3x20  Standing holding leaning on wall -using 2 foam hand buoy: shoulder horizontal add/abd; flex/ext; add/abd x 10     Gait training submerged ~ 50-60%.     Pt requires buoyancy for support and to offload joints with strengthening exercises. Viscosity of the water is needed for resistance of strengthening; water current perturbations provides challenge to standing balance unsupported, requiring increased core activation.    PATIENT EDUCATION:  Education details: exercise progression Person educated: Patient Education method: Explanation, Demonstration, Tactile cues, Verbal cues, and Handouts Education comprehension: verbalized understanding, returned demonstration, verbal cues required, and tactile cues required   HOME EXERCISE PROGRAM: Utilized previous HEP printouts  ASSESSMENT:  CLINICAL IMPRESSION: Pt leans posteriorly weightbearing on heels with STS, and is forward flexed during amb (using buoys/CGA for UE support).  Pt requires additional cues for form due to visual impairment. He requires moderate cues for upright posture during session and to increase step length. He tolerated all exercises without increase in pain in back.  He will continue to benefit from aquatic to improve with above difficulties in an environment with less fall risk.    Objective impairments include Abnormal gait, decreased activity tolerance, decreased balance, decreased endurance, decreased knowledge of use of DME, decreased mobility, difficulty walking, decreased ROM, decreased strength, hypomobility, impaired flexibility, impaired sensation, improper body mechanics, and postural dysfunction.  These impairments are limiting patient from cleaning, community activity, driving, meal prep, occupation, laundry, yard work, and shopping. Personal factors including Age, Behavior pattern, Fitness, Past/current experiences, Time since onset of injury/illness/exacerbation, and 3+ comorbidities:  are also affecting patient's functional outcome. Patient will benefit from skilled PT to address above impairments and improve overall function.  REHAB POTENTIAL: Fair   CLINICAL DECISION MAKING: Evolving/moderate complexity  EVALUATION COMPLEXITY: Low   GOALS:   SHORT TERM GOALS:  STG Name Target Date Goal status  1 Pt will become independent with HEP in order to demonstrate synthesis of PT education.  Baseline:  12/21/2021 INITIAL  2 Pt will be able to demonstrate STS without UE support in order to demonstrate  functional improvement in LE function for self-care and house hold duties.  Baseline:  12/21/2021 INITIAL  3 Pt will be able to walk at least 1200 ft during 6MWT in order to demonstrate improvement in functional endurance and mobility.  Baseline: 12/21/2021 INITIAL   LONG TERM GOALS:   LTG Name Target Date Goal status  1 Pt will become independent with HEP in order to demonstrate synthesis of PT education.  Baseline: 02/01/2022 INITIAL  2 Pt will be able to demonstrate TUG in under 10 sec in order to demonstrate functional improvement in LE function, strength, balance, and mobility for safety with community ambulation.  Baseline: 02/01/2022 INITIAL  3 Pt will be able to demonstrate/report ability to walk >10 mins without LE pain in order to demonstrate functional improvement and tolerance to exercise and community mobility.   Baseline: 02/01/2022 INITIAL  4 Pt will be able to walk at least 3280 ft or 1064mduring 6MWT in order to demonstrate improvement in functional endurance and mobility for decrease in all cause mortality.  Baseline: 02/01/2022 INITIAL   PLAN: PT FREQUENCY:  1-2x/week  PT DURATION: 12 weeks  PLANNED INTERVENTIONS: Therapeutic exercises, Therapeutic activity, Neuro Muscular re-education, Balance training, Gait training, Patient/Family education, Joint mobilization, Stair training, DME instructions, Aquatic Therapy, Dry Needling, Electrical stimulation, Spinal mobilization, Cryotherapy, Moist heat, scar mobilization, Splintting, Taping, Vasopneumatic device, Traction, Ultrasound, Ionotophoresis 417mml Dexamethasone, and Manual therapy  PLAN FOR NEXT SESSION:  will need assistance in water; LE strengthening due to stenosis, endurance training  JeKerin PernaPTA 12/01/21 1:45 PM

## 2021-12-08 ENCOUNTER — Encounter (HOSPITAL_BASED_OUTPATIENT_CLINIC_OR_DEPARTMENT_OTHER): Payer: Self-pay | Admitting: Physical Therapy

## 2021-12-08 ENCOUNTER — Other Ambulatory Visit: Payer: Self-pay

## 2021-12-08 ENCOUNTER — Ambulatory Visit (HOSPITAL_BASED_OUTPATIENT_CLINIC_OR_DEPARTMENT_OTHER): Payer: PPO | Attending: Family Medicine | Admitting: Physical Therapy

## 2021-12-08 DIAGNOSIS — M545 Low back pain, unspecified: Secondary | ICD-10-CM | POA: Insufficient documentation

## 2021-12-08 DIAGNOSIS — R262 Difficulty in walking, not elsewhere classified: Secondary | ICD-10-CM | POA: Diagnosis not present

## 2021-12-08 DIAGNOSIS — M6281 Muscle weakness (generalized): Secondary | ICD-10-CM | POA: Insufficient documentation

## 2021-12-08 NOTE — Therapy (Signed)
OUTPATIENT PHYSICAL THERAPY TREATMENT NOTE   Patient Name: Kirk [name] MRN: [id number] DOB:01-02-1941, 81 y.o., male Today's Date: 12/08/2021  PCP: [name], MD REFERRING PROVIDER: [name], MD   PT End of Session - 12/08/21 1557     Visit Number 5    Number of Visits 21    Date for PT Re-Evaluation 02/07/22    PT Start Time 1450    PT Stop Time 7591    PT Time Calculation (min) 40 min    Activity Tolerance Patient tolerated treatment well    Behavior During Therapy WFL for tasks assessed/performed                Past Medical History:  Diagnosis Date   Cataract    Congenital glaucoma    "both eyes operated on when I was 15 months old"   Cough    Disturbance of skin sensation    Diverticulosis of colon 2014   Diverticulum of esophagus, acquired    Dysphagia, unspecified(787.20)    Encounter for long-term (current) use of other medications    Herpes zoster without mention of complication    Hyperglycemia    patient denies   Hyperlipidemia    Hypertension    Hypertrophy of prostate without urinary obstruction and other lower urinary tract symptoms (LUTS)    Lumbago    Nevus, non-neoplastic    Osteoarthrosis, unspecified whether generalized or localized, unspecified site    pt. denies   Other premature beats    Pain in limb    Plantar fascial fibromatosis    Special screening for malignant neoplasm of prostate    Unspecified glaucoma(365.9)    Unspecified pruritic disorder    Unspecified vitamin D deficiency    Urinary frequency    Zenker's diverticulum    Past Surgical History:  Procedure Laterality Date   CATARACT EXTRACTION BILATERAL W/ ANTERIOR VITRECTOMY Bilateral 1977   COLONOSCOPY  09/06/2013   [name]   DIRECT LARYNGOSCOPY  10/22/2015   Cervical esophagoscopy.  Endoscopic esophageal diverticulotomy.    [place]   congenital glaucoma   HERNIA REPAIR  1993   INCISIONAL HERNIA REPAIR  2008    LARYNGOSCOPY     with stapling of zenkers diverticulum   LUMBAR LAMINECTOMY/ DECOMPRESSION WITH MET-RX Left 08/16/2019   Procedure: Left Lumbar Two-Three Minimally Invasive Laminectomy and Microdiscectomy;  Surgeon: Judith Part, MD;  Location: [facility];  Service: Neurosurgery;  Laterality: Left;  Left Lumbar 2-3 Minimally invasive laminectomy and microdiscectomy   Patient Active Problem List   Diagnosis Date Noted   Central stenosis of spinal canal 05/26/2021   Actinic keratosis 06/16/2020   Bilateral leg weakness 06/16/2020   Onychomycosis 12/13/2019   Herniated lumbar intervertebral disc 08/16/2019   Degenerative lumbar spinal stenosis 02/14/2019   TSH elevation 02/25/2016   Orthostatic hypotension 02/17/2016   Syncope 01/07/2016   Esophageal diverticulum, acquired 10/22/2015   Xeroderma 08/20/2015   Bifascicular block 08/20/2015   Neuropathy ([place]) 02/05/2015   Diverticulosis of colon    Vitamin D deficiency    Hyperglycemia    Dysphagia    Diverticulum of esophagus, acquired    Hyperlipidemia    Glaucoma    BPH (benign prostatic hyperplasia)    Essential hypertension 02/01/2013   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    REFERRING DIAG: R29.898 (ICD-10-CM) - Bilateral leg weakness  THERAPY DIAG:  Pain, lumbar region  Difficulty walking  Muscle weakness (generalized)  PERTINENT HISTORY:  Congenital glaucoma, Nov 2020 disc herniation surgery, vision deficits- lack of depth perception, and peripheral vision  PRECAUTIONS: none  SUBJECTIVE: wife reports pt has more energy.  " He is able to get the walker out of the trunk by himself"   PAIN:  Are you having pain? No   PRECAUTIONS: None  WEIGHT BEARING RESTRICTIONS No  FALLS:  Has patient fallen in last 6 months? No, Number of falls: 0  LIVING ENVIRONMENT: Lives with: lives with their family and lives with their spouse Lives in: House/apartment Stairs: Yes; 5- cane on steps, rail on the L  Has following equipment  at home: Single point cane and Walker - 4 wheeled  OCCUPATION: retired   PLOF: Independent with household mobility with device  PATIENT GOALS : improve LE weakness. "Walk one day without the walker"   OBJECTIVE:   DIAGNOSTIC FINDINGS:  Lumbar spine:   1. Persistent prominent left subarticular zone inferiorly migrated extrusion at L2-L3 with severe spinal canal stenosis and impingement of the cauda equina nerve roots, particularly on the left. The extrusion is slightly smaller compared to the study from 2020. 2. Left subarticular zone protrusion at L1-L2 with contact and possible impingement of the traversing left L2 nerve root, similar to the prior study. Otherwise, no high-grade spinal canal or neural foraminal stenosis in the lumbar spine. 3. Multilevel facet arthropathy, most advanced at L4-L5, with bilateral facet joint effusions and perifacetal soft tissue edema at L2-L3 through L4-L5. 4. Degenerative marrow signal abnormality at L1 through L3, progressed since 2020.  PATIENT SURVEYS:  FOTO N/A  SCREENING FOR RED FLAGS: Bowel or bladder incontinence: No Spinal tumors: No Cauda equina syndrome: No Compression fracture: No   COGNITION:  Overall cognitive status: Within functional limits for tasks assessed     SENSATION:  Light touch: Deficits      POSTURE:  Kyphotic, and decrease lumbar lordosis   PALPATION: Significant atrophy in bilat thigh and shanks   LUMBARAROM/PROM: requires UE support in seated   A/PROM A/PROM  11/09/2021  Flexion 50%  Extension 0%  Right lateral flexion 25%  Left lateral flexion 25%  Right rotation 50%  Left rotation 50%   (Blank rows = not tested)  LE AROM/PROM: moderate limitations into hip flexion, ER, and IR; bilat knees WFL- lacking full TKE but symmetrical bilaterally  LE MMT:  MMT Right 11/09/2021 Left 11/09/2021  Hip flexion 4+/5 4+/5  Hip extension    Hip abduction 4+/5 4+/5  Hip adduction 4+/5 4+/5  Hip  internal rotation    Hip external rotation    Knee flexion 4+/5 4+/5  Knee extension 5/5 5/5  Ankle dorsiflexion 4/5 4/5  Ankle plantarflexion 3/5 3/5   (Blank rows = not tested)   FUNCTIONAL TESTS:  TUG: 21s STS transfers: bilat UE require- one for push off and one for balance Standing balance: pt unable to perform 4 stage balance or marching without UE assistance  GAIT: Distance walked: 6MWT 665f  Assistive device utilized: Walker - 4 wheeled Level of assistance: Modified independence Comments: increased fwd trunk lean and UE pressure through walker as fatigue increased, cross step/ stumble with fatigue    TODAY'S TREATMENT  12/08/21 Pt seen for aquatic therapy today.  Treatment [place] place [place] 3.25[place]4.[place] [facility] [place] 91.  Pt entered/exited the pool via stairs step to pattern independently with bilat rail.   Reviewed current function, pain levels, response to prior Rx, and HEP compliance.  CGA assist for all standing exercises due to decreased balance: Walking using yellow noodle all depths Fwd, back and side stepping (multiple reps) Cues for upright posture and slowed cadence and control  Holding onto wall:  Step ups:Forward step up x 10 each leg 22f DF and PF 2x20 Hip abdct 2x 10 each leg Hip ext 2x10 each leg (cues to keep knees straight).  Manual asst last rep for hip flex stretch Hamstring curls x 10 each leg Marching x 20   Seated:  Sit to/from stand on bench in water x 8 reps hha with cues for immediate standing balance Flutter kick and add/abd 2x20  Pt requires buoyancy for support and to offload joints with strengthening exercises. Viscosity of the water is needed for resistance of strengthening; water current perturbations provides challenge to standing balance unsupported, requiring increased core activation.  12/01/21 Pt seen for aquatic therapy today.  Treatment took place in water 3.25-4.5 ft in  [place] [facility] Temp of water was 91.  Pt entered/exited the pool via stairs step to pattern independently with bilat rail.   Reviewed current function, pain levels, response to prior Rx, and HEP compliance.    CGA assist for all standing exercises due to decreased balance: Walking using yellow noodle all depths Fwd, back and side stepping (multiple reps) Cues for upright posture and increased step length Forward marching  Seated:  Sit to/from stand on bench in water x 8 reps, cues to not use back of legs on bench.   Holding onto wall:  Forward step up x 10 each leg.  Hip abdct x 10 each leg Squats x 15 Hip ext x 10 each leg (cues to keep knees straight) Hamstring curls x 5 each leg  Standing hamstring stretch with foot on step x 15 sec x 2 reps each LE  Pt requires buoyancy for support and to offload joints with strengthening exercises. Viscosity of the water is needed for resistance of strengthening; water current perturbations provides challenge to standing balance unsupported, requiring increased core activation.  11/24/21 Pt seen for aquatic therapy today.  Treatment took [facility] in [facility] 3.25-4.[facility] Temp [facility] 91.  Pt entered/exited the pool via stairs step to pattern independently with bilat rail.   Reviewed current function, pain levels, response to prior Rx, and HEP compliance.    Walking using yellow noodle all depths Fwd, back and side stepping   Seated -STS from bench. Instruction and demonstration. Moved to 3rd step.   -Stretching             -Flutter kicking 3x20             -add/abd 3x20  Standing holding leaning on wall -using 2 foam hand buoy: shoulder horizontal add/abd; flex/ext; add/abd x 10     Gait training submerged ~ 50-60%.     Pt requires buoyancy for support and to offload joints with strengthening exercises. Viscosity of the water is needed for resistance of strengthening;  water current perturbations provides challenge to standing balance unsupported, requiring increased core activation.    PATIENT EDUCATION:  Education details: exercise progression Person educated: Patient Education method: Explanation, Demonstration, Tactile cues, Verbal cues, and Handouts Education comprehension: verbalized understanding, returned demonstration, verbal cues required, and tactile cues required   HOME EXERCISE PROGRAM: Utilized previous HEP printouts  ASSESSMENT:  CLINICAL IMPRESSION: Cues for forward weight shifting with STS transfers to improve immediate standing balance.  As  pt decreases fear his weight shifting improves. Pt requires moderate cues for slowed cadence with amb.  Uncertain if he is actually losing his balance forward vs walking with increased speed.  He has good response to sessions, no reports of discomfort.  He has improved mobility as evidenced by getting walker out of trunk as wife reports he has not been able to complete for sometime and is now completing daily since receiving skilled physical therapy.       Objective impairments include Abnormal gait, decreased activity tolerance, decreased balance, decreased endurance, decreased knowledge of use of DME, decreased mobility, difficulty walking, decreased ROM, decreased strength, hypomobility, impaired flexibility, impaired sensation, improper body mechanics, and postural dysfunction. These impairments are limiting patient from cleaning, community activity, driving, meal prep, occupation, laundry, yard work, and shopping. Personal factors including Age, Behavior pattern, Fitness, Past/current experiences, Time since onset of injury/illness/exacerbation, and 3+ comorbidities:  are also affecting patient's functional outcome. Patient will benefit from skilled PT to address above impairments and improve overall function.  REHAB POTENTIAL: Fair   CLINICAL DECISION MAKING: Evolving/moderate  complexity  EVALUATION COMPLEXITY: Low   GOALS:   SHORT TERM GOALS:  STG Name Target Date Goal status  1 Pt will become independent with HEP in order to demonstrate synthesis of PT education.  Baseline:  12/21/2021 INITIAL  2 Pt will be able to demonstrate STS without UE support in order to demonstrate functional improvement in LE function for self-care and house hold duties.  Baseline:  12/21/2021 INITIAL  3 Pt will be able to walk at least 1200 ft during 6MWT in order to demonstrate improvement in functional endurance and mobility.  Baseline: 12/21/2021 INITIAL   LONG TERM GOALS:   LTG Name Target Date Goal status  1 Pt will become independent with HEP in order to demonstrate synthesis of PT education.  Baseline: 02/01/2022 INITIAL  2 Pt will be able to demonstrate TUG in under 10 sec in order to demonstrate functional improvement in LE function, strength, balance, and mobility for safety with community ambulation.  Baseline: 02/01/2022 INITIAL  3 Pt will be able to demonstrate/report ability to walk >10 mins without LE pain in order to demonstrate functional improvement and tolerance to exercise and community mobility.   Baseline: 02/01/2022 INITIAL  4 Pt will be able to walk at least 3280 ft or 1045mduring 6MWT in order to demonstrate improvement in functional endurance and mobility for decrease in all cause mortality.  Baseline: 02/01/2022 INITIAL   PLAN: PT FREQUENCY: 1-2x/week  PT DURATION: 12 weeks  PLANNED INTERVENTIONS: Therapeutic exercises, Therapeutic activity, Neuro Muscular re-education, Balance training, Gait training, Patient/Family education, Joint mobilization, Stair training, DME instructions, Aquatic Therapy, Dry Needling, Electrical stimulation, Spinal mobilization, Cryotherapy, Moist heat, scar mobilization, Splintting, Taping, Vasopneumatic device, Traction, Ultrasound, Ionotophoresis 477mml Dexamethasone, and Manual therapy  PLAN FOR NEXT SESSION:  will need  assistance in water; LE strengthening due to stenosis, endurance training, post chain stretching and strengthening  MaStanton KidneyFTharon AquasZiemba MPT 12/08/21 3:58 PM

## 2021-12-10 ENCOUNTER — Encounter (HOSPITAL_BASED_OUTPATIENT_CLINIC_OR_DEPARTMENT_OTHER): Payer: Self-pay | Admitting: Physical Therapy

## 2021-12-10 ENCOUNTER — Ambulatory Visit (HOSPITAL_BASED_OUTPATIENT_CLINIC_OR_DEPARTMENT_OTHER): Payer: PPO | Admitting: Physical Therapy

## 2021-12-10 ENCOUNTER — Other Ambulatory Visit: Payer: Self-pay

## 2021-12-10 DIAGNOSIS — R262 Difficulty in walking, not elsewhere classified: Secondary | ICD-10-CM

## 2021-12-10 DIAGNOSIS — M6281 Muscle weakness (generalized): Secondary | ICD-10-CM

## 2021-12-10 DIAGNOSIS — M545 Low back pain, unspecified: Secondary | ICD-10-CM | POA: Diagnosis not present

## 2021-12-10 NOTE — Therapy (Signed)
OUTPATIENT PHYSICAL THERAPY TREATMENT NOTE   Patient Name: Kirk [name] MRN: [id number] DOB:Mar 12, 1941, 81 y.o., male Today's Date: 12/10/2021  PCP: [name], MD REFERRING PROVIDER: [name], MD   PT End of Session - 12/10/21 1443     Visit Number 6    Number of Visits 21    Date for PT Re-Evaluation 02/07/22    Authorization Type HTA    PT Start Time 2800    PT Stop Time 3491    PT Time Calculation (min) 45 min                Past Medical History:  Diagnosis Date   Cataract    Congenital glaucoma    "both eyes operated on when I was 3 months old"   Cough    Disturbance of skin sensation    Diverticulosis of colon 2014   Diverticulum of esophagus, acquired    Dysphagia, unspecified(787.20)    Encounter for long-term (current) use of other medications    Herpes zoster without mention of complication    Hyperglycemia    patient denies   Hyperlipidemia    Hypertension    Hypertrophy of prostate without urinary obstruction and other lower urinary tract symptoms (LUTS)    Lumbago    Nevus, non-neoplastic    Osteoarthrosis, unspecified whether generalized or localized, unspecified site    pt. denies   Other premature beats    Pain in limb    Plantar fascial fibromatosis    Special screening for malignant neoplasm of prostate    Unspecified glaucoma(365.9)    Unspecified pruritic disorder    Unspecified vitamin D deficiency    Urinary frequency    Zenker's diverticulum    Past Surgical History:  Procedure Laterality Date   CATARACT EXTRACTION BILATERAL W/ ANTERIOR VITRECTOMY Bilateral 1977   COLONOSCOPY  09/06/2013   [name]   DIRECT LARYNGOSCOPY  10/22/2015   Cervical esophagoscopy.  Endoscopic esophageal diverticulotomy.    [place]   congenital glaucoma   HERNIA REPAIR  1993   INCISIONAL HERNIA REPAIR  2008   LARYNGOSCOPY     with stapling of zenkers diverticulum   LUMBAR LAMINECTOMY/  DECOMPRESSION WITH MET-RX Left 08/16/2019   Procedure: Left Lumbar Two-Three Minimally Invasive Laminectomy and Microdiscectomy;  Surgeon: Judith Part, MD;  Location: [facility];  Service: Neurosurgery;  Laterality: Left;  Left Lumbar 2-3 Minimally invasive laminectomy and microdiscectomy   Patient Active Problem List   Diagnosis Date Noted   Central stenosis of spinal canal 05/26/2021   Actinic keratosis 06/16/2020   Bilateral leg weakness 06/16/2020   Onychomycosis 12/13/2019   Herniated lumbar intervertebral disc 08/16/2019   Degenerative lumbar spinal stenosis 02/14/2019   TSH elevation 02/25/2016   Orthostatic hypotension 02/17/2016   Syncope 01/07/2016   Esophageal diverticulum, acquired 10/22/2015   Xeroderma 08/20/2015   Bifascicular block 08/20/2015   Neuropathy ([place]) 02/05/2015   Diverticulosis of colon    Vitamin D deficiency    Hyperglycemia    Dysphagia    Diverticulum of esophagus, acquired    Hyperlipidemia    Glaucoma    BPH (benign prostatic hyperplasia)    Essential hypertension 02/01/2013   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    REFERRING DIAG: R29.898 (ICD-10-CM) - Bilateral leg weakness  THERAPY DIAG:  Pain, lumbar region  Difficulty walking  Muscle weakness (generalized)  PERTINENT HISTORY: Congenital glaucoma, Nov 2020 disc herniation surgery, vision deficits- lack of depth perception,  and peripheral vision  PRECAUTIONS: none  SUBJECTIVE: Pt reports no pain just a stretch in his LB  PAIN:  Are you having pain? No   PRECAUTIONS: None  WEIGHT BEARING RESTRICTIONS No  FALLS:  Has patient fallen in last 6 months? No, Number of falls: 0  LIVING ENVIRONMENT: Lives with: lives with their family and lives with their spouse Lives in: House/apartment Stairs: Yes; 5- cane on steps, rail on the L  Has following equipment at home: Single point cane and Walker - 4 wheeled  OCCUPATION: retired   PLOF: Independent with household mobility with  device  PATIENT GOALS : improve LE weakness. "Walk one day without the walker"   OBJECTIVE:   DIAGNOSTIC FINDINGS:  Lumbar spine:   1. Persistent prominent left subarticular zone inferiorly migrated extrusion at L2-L3 with severe spinal canal stenosis and impingement of the cauda equina nerve roots, particularly on the left. The extrusion is slightly smaller compared to the study from 2020. 2. Left subarticular zone protrusion at L1-L2 with contact and possible impingement of the traversing left L2 nerve root, similar to the prior study. Otherwise, no high-grade spinal canal or neural foraminal stenosis in the lumbar spine. 3. Multilevel facet arthropathy, most advanced at L4-L5, with bilateral facet joint effusions and perifacetal soft tissue edema at L2-L3 through L4-L5. 4. Degenerative marrow signal abnormality at L1 through L3, progressed since 2020.  PATIENT SURVEYS:  FOTO N/A  SCREENING FOR RED FLAGS: Bowel or bladder incontinence: No Spinal tumors: No Cauda equina syndrome: No Compression fracture: No   COGNITION:  Overall cognitive status: Within functional limits for tasks assessed     SENSATION:  Light touch: Deficits      POSTURE:  Kyphotic, and decrease lumbar lordosis   PALPATION: Significant atrophy in bilat thigh and shanks   LUMBARAROM/PROM: requires UE support in seated   A/PROM A/PROM  11/09/2021  Flexion 50%  Extension 0%  Right lateral flexion 25%  Left lateral flexion 25%  Right rotation 50%  Left rotation 50%   (Blank rows = not tested)  LE AROM/PROM: moderate limitations into hip flexion, ER, and IR; bilat knees WFL- lacking full TKE but symmetrical bilaterally  LE MMT:  MMT Right 11/09/2021 Left 11/09/2021  Hip flexion 4+/5 4+/5  Hip extension    Hip abduction 4+/5 4+/5  Hip adduction 4+/5 4+/5  Hip internal rotation    Hip external rotation    Knee flexion 4+/5 4+/5  Knee extension 5/5 5/5  Ankle dorsiflexion 4/5 4/5   Ankle plantarflexion 3/5 3/5   (Blank rows = not tested)   FUNCTIONAL TESTS:  TUG: 21s STS transfers: bilat UE require- one for push off and one for balance Standing balance: pt unable to perform 4 stage balance or marching without UE assistance  GAIT: Distance walked: 6MWT 617f  Assistive device utilized: Walker - 4 wheeled Level of assistance: Modified independence Comments: increased fwd trunk lean and UE pressure through walker as fatigue increased, cross step/ stumble with fatigue    TODAY'S TREATMENT   12/10/21 Pt seen for aquatic therapy today.  Treatment took place in water 3.25-4.[facility] Temp of [facility] was 91.  Pt entered/exited the pool via stairs step to pattern independently with bilat rail.   Reviewed current function, pain levels, response to prior Rx, and HEP compliance.    Walking using yellow hand buoys all depths Fwd, back and side stepping. SBA   Seated -STS from 3rd step. HHA  for weight shifting forward. Cues for erect posture to gain immediate standing balance. 2x10  -Stretching             -Flutter kicking 3x20            Standing: cues for abdominal bracing -using 2 foam hand buoy: shoulder horizontal add/abd; flex/ext; add/abd x 10 ea -pushing and pulling kick board x 15 rep    Hand on wall other supported with hand buoy: add/abd, hip flex; extension and marching 2x10 ea.   Pt requires buoyancy for support and to offload joints with strengthening exercises. Viscosity of the water is needed for resistance of strengthening; water current perturbations provides challenge to standing balance unsupported, requiring increased core activation.   12/08/21 Pt seen for aquatic therapy today.  Treatment took place in water 3.25-4.5 ft in depth at the [facility]. Temp of water was 91.  Pt entered/exited the pool via stairs step to pattern independently with bilat rail.   Reviewed current function, pain  levels, response to prior Rx, and HEP compliance.    CGA assist for all standing exercises due to decreased balance: Walking using yellow noodle all depths Fwd, back and side stepping (multiple reps) Cues for upright posture and slowed cadence and control  Holding onto wall:  Step ups:Forward step up x 10 each leg 73f DF and PF 2x20 Hip abdct 2x 10 each leg Hip ext 2x10 each leg (cues to keep knees straight).  Manual asst last rep for hip flex stretch Hamstring curls x 10 each leg Marching x 20   Seated:  Sit to/from stand on bench in water x 8 reps hha with cues for immediate standing balance Flutter kick and add/abd 2x20  Pt requires buoyancy for support and to offload joints with strengthening exercises. Viscosity of the water is needed for resistance of strengthening; water current perturbations provides challenge to standing balance unsupported, requiring increased core activation.       PATIENT EDUCATION:  Education details: exercise progression Person educated: Patient Education method: Explanation, Demonstration, Tactile cues, Verbal cues, and Handouts Education comprehension: verbalized understanding, returned demonstration, verbal cues required, and tactile cues required   HOME EXERCISE PROGRAM: Utilized previous HEP printouts  ASSESSMENT:  CLINICAL IMPRESSION: Improved weight shifting forward to initiate standing. HHA/CGA needed for confidence and safety with gaining immediate standing balance.  Increased core engagement with LE exercises which is tolerated well by decreasing ue support on wall with standing exercises and amb with hand buoys rather than noodle. This demonstrates improving core strength. 3 recovery periods given.    Objective impairments include Abnormal gait, decreased activity tolerance, decreased balance, decreased endurance, decreased knowledge of use of DME, decreased mobility, difficulty walking, decreased ROM, decreased strength,  hypomobility, impaired flexibility, impaired sensation, improper body mechanics, and postural dysfunction. These impairments are limiting patient from cleaning, community activity, driving, meal prep, occupation, laundry, yard work, and shopping. Personal factors including Age, Behavior pattern, Fitness, Past/current experiences, Time since onset of injury/illness/exacerbation, and 3+ comorbidities:  are also affecting patient's functional outcome. Patient will benefit from skilled PT to address above impairments and improve overall function.  REHAB POTENTIAL: Fair   CLINICAL DECISION MAKING: Evolving/moderate complexity  EVALUATION COMPLEXITY: Low   GOALS:   SHORT TERM GOALS:  STG Name Target Date Goal status  1 Pt will become independent with HEP in order to demonstrate synthesis of PT education.  Baseline:  12/21/2021 INITIAL  2 Pt will be able to demonstrate STS without UE support in  order to demonstrate functional improvement in LE function for self-care and house hold duties.  Baseline:  12/21/2021 INITIAL  3 Pt will be able to walk at least 1200 ft during 6MWT in order to demonstrate improvement in functional endurance and mobility.  Baseline: 12/21/2021 INITIAL   LONG TERM GOALS:   LTG Name Target Date Goal status  1 Pt will become independent with HEP in order to demonstrate synthesis of PT education.  Baseline: 02/01/2022 INITIAL  2 Pt will be able to demonstrate TUG in under 10 sec in order to demonstrate functional improvement in LE function, strength, balance, and mobility for safety with community ambulation.  Baseline: 02/01/2022 INITIAL  3 Pt will be able to demonstrate/report ability to walk >10 mins without LE pain in order to demonstrate functional improvement and tolerance to exercise and community mobility.   Baseline: 02/01/2022 INITIAL  4 Pt will be able to walk at least 3280 ft or 1069mduring 6MWT in order to demonstrate improvement in functional endurance and  mobility for decrease in all cause mortality.  Baseline: 02/01/2022 INITIAL   PLAN: PT FREQUENCY: 1-2x/week  PT DURATION: 12 weeks  PLANNED INTERVENTIONS: Therapeutic exercises, Therapeutic activity, Neuro Muscular re-education, Balance training, Gait training, Patient/Family education, Joint mobilization, Stair training, DME instructions, Aquatic Therapy, Dry Needling, Electrical stimulation, Spinal mobilization, Cryotherapy, Moist heat, scar mobilization, Splintting, Taping, Vasopneumatic device, Traction, Ultrasound, Ionotophoresis 458mml Dexamethasone, and Manual therapy  PLAN FOR NEXT SESSION:  will need assistance in water; LE strengthening due to stenosis, endurance training, post chain stretching and strengthening  MaStanton KidneyFTharon AquasZiemba MPT 12/10/21 6:08 PM

## 2021-12-15 ENCOUNTER — Other Ambulatory Visit: Payer: Self-pay

## 2021-12-15 ENCOUNTER — Ambulatory Visit (HOSPITAL_BASED_OUTPATIENT_CLINIC_OR_DEPARTMENT_OTHER): Payer: PPO | Admitting: Physical Therapy

## 2021-12-15 ENCOUNTER — Encounter (HOSPITAL_BASED_OUTPATIENT_CLINIC_OR_DEPARTMENT_OTHER): Payer: Self-pay | Admitting: Physical Therapy

## 2021-12-15 DIAGNOSIS — M545 Low back pain, unspecified: Secondary | ICD-10-CM

## 2021-12-15 DIAGNOSIS — R262 Difficulty in walking, not elsewhere classified: Secondary | ICD-10-CM

## 2021-12-15 DIAGNOSIS — M6281 Muscle weakness (generalized): Secondary | ICD-10-CM

## 2021-12-15 NOTE — Therapy (Signed)
?OUTPATIENT PHYSICAL THERAPY TREATMENT NOTE ? ? ?Patient Name: Kirk [name] ?MRN: [id number] ?DOB:[birth year], 81 y.o., male ?Today's Date: 12/15/2021 ? ?PCP: [name], MD ?REFERRING PROVIDER: [name], MD ? ? PT End of Session - 12/15/21 1509   ? ? Visit Number 7   ? Number of Visits 21   ? Date for PT Re-Evaluation 02/07/22   ? Authorization Type HTA   ? PT Start Time 8563   ? PT Stop Time 1530   ? PT Time Calculation (min) 44 min   ? Activity Tolerance Patient tolerated treatment well   ? Behavior During Therapy [facility] for tasks assessed/performed   ? ?  ?  ? ?  ? ? ? ? ? ?Past Medical History:  ?Diagnosis Date  ? Cataract   ? Congenital glaucoma   ? "both eyes operated on when I was 53 months old"  ? Cough   ? Disturbance of skin sensation   ? Diverticulosis of colon 2014  ? Diverticulum of esophagus, acquired   ? Dysphagia, unspecified(787.20)   ? Encounter for long-term (current) use of other medications   ? Herpes zoster without mention of complication   ? Hyperglycemia   ? patient denies  ? Hyperlipidemia   ? Hypertension   ? Hypertrophy of prostate without urinary obstruction and other lower urinary tract symptoms (LUTS)   ? Lumbago   ? Nevus, non-neoplastic   ? Osteoarthrosis, unspecified whether generalized or localized, unspecified site   ? pt. denies  ? Other premature beats   ? Pain in limb   ? Plantar fascial fibromatosis   ? Special screening for malignant neoplasm of prostate   ? Unspecified glaucoma(365.9)   ? Unspecified pruritic disorder   ? Unspecified vitamin D deficiency   ? Urinary frequency   ? Zenker's diverticulum   ? ?Past Surgical History:  ?Procedure Laterality Date  ? CATARACT EXTRACTION BILATERAL W/ ANTERIOR VITRECTOMY Bilateral 1977  ? COLONOSCOPY  09/06/2013  ? [name]  ? DIRECT LARYNGOSCOPY  10/22/2015  ? Cervical esophagoscopy.  Endoscopic esophageal diverticulotomy.   ? [place]  ? GLAUCOMA SURGERY  1945  ? congenital glaucoma  ? HERNIA REPAIR  1993  ?  [place] REPAIR  2008  ? LARYNGOSCOPY    ? with stapling of zenkers diverticulum  ? LUMBAR LAMINECTOMY/ DECOMPRESSION WITH MET-RX Left 08/16/2019  ? Procedure: Left Lumbar Two-Three Minimally Invasive Laminectomy and Microdiscectomy;  Surgeon: Judith Part, MD;  Location: [place];  Service: Neurosurgery;  Laterality: Left;  Left Lumbar 2-3 Minimally invasive laminectomy and microdiscectomy  ? ?Patient Active Problem List  ? Diagnosis Date Noted  ? Central stenosis of spinal canal 05/26/2021  ? Actinic keratosis 06/16/2020  ? Bilateral leg weakness 06/16/2020  ? Onychomycosis 12/13/2019  ? Herniated lumbar intervertebral disc 08/16/2019  ? Degenerative lumbar spinal stenosis 02/14/2019  ? TSH elevation 02/25/2016  ? Orthostatic hypotension 02/17/2016  ? Syncope 01/07/2016  ? Esophageal diverticulum, acquired 10/22/2015  ? Xeroderma 08/20/2015  ? Bifascicular block 08/20/2015  ? Neuropathy ([place]) 02/05/2015  ? Diverticulosis of colon   ? Vitamin D deficiency   ? Hyperglycemia   ? Dysphagia   ? Diverticulum of esophagus, acquired   ? Hyperlipidemia   ? Glaucoma   ? BPH (benign prostatic hyperplasia)   ? Essential hypertension 02/01/2013  ? 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  ? ? ?REFERRING DIAG: R29.898 (ICD-10-CM) - Bilateral leg weakness ? ?THERAPY DIAG:  ?Pain, lumbar region ? ?Difficulty walking ? ?  Muscle weakness (generalized) ? ?PERTINENT HISTORY: Congenital glaucoma, Nov 2020 disc herniation surgery, vision deficits- lack of depth perception, and peripheral vision ? ?PRECAUTIONS: none ? ?SUBJECTIVE: "My hips are tight today front and back" ? ?PAIN:  ?Are you having pain? No ? ? ?PRECAUTIONS: None ? ?WEIGHT BEARING RESTRICTIONS No ? ?FALLS:  ?Has patient fallen in last 6 months? No, Number of falls: 0 ? ?LIVING ENVIRONMENT: ?Lives with: lives with their family and lives with their spouse ?Lives in: House/apartment ?Stairs: Yes; 5- cane on steps, rail on the L  ?Has following equipment at home: Single point  cane and Walker - 4 wheeled ? ?OCCUPATION: retired  ? ?PLOF: Independent with household mobility with device ? ?PATIENT GOALS : improve LE weakness. "Walk one day without the walker" ? ? ?OBJECTIVE:  ? ?DIAGNOSTIC FINDINGS:  ?Lumbar spine: ?  ?1. Persistent prominent left subarticular zone inferiorly migrated ?extrusion at L2-L3 with severe spinal canal stenosis and impingement ?of the cauda equina nerve roots, particularly on the left. The ?extrusion is slightly smaller compared to the study from 2020. ?2. Left subarticular zone protrusion at L1-L2 with contact and ?possible impingement of the traversing left L2 nerve root, similar ?to the prior study. Otherwise, no high-grade spinal canal or neural ?foraminal stenosis in the lumbar spine. ?3. Multilevel facet arthropathy, most advanced at L4-L5, with ?bilateral facet joint effusions and perifacetal soft tissue edema at ?L2-L3 through L4-L5. ?4. Degenerative marrow signal abnormality at L1 through L3, ?progressed since 2020. ? ?PATIENT SURVEYS:  ?FOTO N/A ? ?SCREENING FOR RED FLAGS: ?Bowel or bladder incontinence: No ?Spinal tumors: No ?Cauda equina syndrome: No ?Compression fracture: No ? ? ?COGNITION: ? Overall cognitive status: Within functional limits for tasks assessed   ?  ?SENSATION: ? Light touch: Deficits  ?  ? ? ?POSTURE:  ?Kyphotic, and decrease lumbar lordosis  ? ?PALPATION: ?Significant atrophy in bilat thigh and shanks  ? ?LUMBARAROM/PROM: requires UE support in seated  ? ?A/PROM A/PROM  ?11/09/2021  ?Flexion 50%  ?Extension 0%  ?Right lateral flexion 25%  ?Left lateral flexion 25%  ?Right rotation 50%  ?Left rotation 50%  ? (Blank rows = not tested) ? ?LE AROM/PROM: moderate limitations into hip flexion, ER, and IR; bilat knees WFL- lacking full TKE but symmetrical bilaterally ? ?LE MMT: ? ?MMT Right ?11/09/2021 Left ?11/09/2021  ?Hip flexion 4+/5 4+/5  ?Hip extension    ?Hip abduction 4+/5 4+/5  ?Hip adduction 4+/5 4+/5  ?Hip internal rotation    ?Hip  external rotation    ?Knee flexion 4+/5 4+/5  ?Knee extension 5/5 5/5  ?Ankle dorsiflexion 4/5 4/5  ?Ankle plantarflexion 3/5 3/5  ? (Blank rows = not tested) ? ? ?FUNCTIONAL TESTS:  ?TUG: 21s ?STS transfers: bilat UE require- one for push off and one for balance ?Standing balance: pt unable to perform 4 stage balance or marching without UE assistance ? ?GAIT: ?Distance walked: 6MWT 628f  ?Assistive device utilized: WEnvironmental consultant- 4 wheeled ?Level of assistance: Modified independence ?Comments: increased fwd trunk lean and UE pressure through walker as fatigue increased, cross step/ stumble with fatigue ? ? ? ?TODAY'S TREATMENT  ? ?12/15/21 ? ?Pt seen for aquatic therapy today.  Treatment took place in water 3.25-4.8 [facility] Temp of [facility] was 91?.  Pt entered/exited the pool via stairs step to pattern independently with bilat rail.  ? ?Reviewed current function, pain levels, response to prior Rx, and HEP compliance.   ? ?Walking using yellow  hand buoys all depths Fwd, back and side stepping. Distant supervision.  Needs vc for maintaining straight path due to impaired vision. ? ? Seated ?-STS from 3rd step. Unilateral ue support on hand rail x10 ? -cycle 3x20 rev ?            -add/abd 3x20 ?           ?Standing: cues for abdominal bracing ?-using 2 foam hand buoy: shoulder horizontal add/abd; flex/ext; add/abd x 10 ea ?-pushing and pulling kick board 2x 15 rep cues for abdominal bracing and maintaining indep standing balance ?   Hand on wall other supported with hand buoy: add/abd 2x10, hip flex; extension and marching x10 ea.  ?   -minisquats in 3 ft. Holding to wall. Cues for weight bearing through heels and glut contraction with extension x10 ? ? ?Pt requires buoyancy for support and to offload joints with strengthening exercises. Viscosity of the water is needed for resistance of strengthening; water current perturbations provides challenge to standing balance unsupported,  requiring increased core activation. ? ?12/10/21 ?Pt seen for aquatic therapy today.  Treatment took place in water 3.25-4.8 ft in depth at the [facility] pool. Temp of water was 91?.  Pt entered/exited th

## 2021-12-17 ENCOUNTER — Ambulatory Visit (HOSPITAL_BASED_OUTPATIENT_CLINIC_OR_DEPARTMENT_OTHER): Payer: PPO | Admitting: Physical Therapy

## 2021-12-17 DIAGNOSIS — H43393 Other vitreous opacities, bilateral: Secondary | ICD-10-CM | POA: Diagnosis not present

## 2021-12-17 DIAGNOSIS — H2703 Aphakia, bilateral: Secondary | ICD-10-CM | POA: Diagnosis not present

## 2021-12-17 DIAGNOSIS — H04123 Dry eye syndrome of bilateral lacrimal glands: Secondary | ICD-10-CM | POA: Diagnosis not present

## 2021-12-17 DIAGNOSIS — H402233 Chronic angle-closure glaucoma, bilateral, severe stage: Secondary | ICD-10-CM | POA: Diagnosis not present

## 2021-12-22 ENCOUNTER — Ambulatory Visit (HOSPITAL_BASED_OUTPATIENT_CLINIC_OR_DEPARTMENT_OTHER): Payer: PPO | Admitting: Physical Therapy

## 2021-12-22 ENCOUNTER — Other Ambulatory Visit: Payer: Self-pay

## 2021-12-22 ENCOUNTER — Encounter (HOSPITAL_BASED_OUTPATIENT_CLINIC_OR_DEPARTMENT_OTHER): Payer: Self-pay | Admitting: Physical Therapy

## 2021-12-22 DIAGNOSIS — M545 Low back pain, unspecified: Secondary | ICD-10-CM | POA: Diagnosis not present

## 2021-12-22 DIAGNOSIS — M6281 Muscle weakness (generalized): Secondary | ICD-10-CM

## 2021-12-22 DIAGNOSIS — R262 Difficulty in walking, not elsewhere classified: Secondary | ICD-10-CM

## 2021-12-22 NOTE — Therapy (Signed)
?OUTPATIENT PHYSICAL THERAPY TREATMENT NOTE ? ? ?Patient Name: Kirk [name] ?MRN: [id number] ?DOB:23-Aug-1941, 81 y.o., male ?Today's Date: 12/22/2021 ? ?PCP: [name], MD ?REFERRING PROVIDER: [name], MD ? ? PT End of Session - 12/22/21 1408   ? ? Visit Number 8   ? Number of Visits 21   ? Date for PT Re-Evaluation 02/07/22   ? Authorization Type HTA   ? PT Start Time 1400   ? PT Stop Time 8242   ? PT Time Calculation (min) 43 min   ? Activity Tolerance Patient tolerated treatment well   ? Behavior During Therapy [facility] for tasks assessed/performed   ? ?  ?  ? ?  ? ? ? ? ? ?Past Medical History:  ?Diagnosis Date  ? Cataract   ? Congenital glaucoma   ? "both eyes operated on when I was 59 months old"  ? Cough   ? Disturbance of skin sensation   ? Diverticulosis of colon 2014  ? Diverticulum of esophagus, acquired   ? Dysphagia, unspecified(787.20)   ? Encounter for long-term (current) use of other medications   ? Herpes zoster without mention of complication   ? Hyperglycemia   ? patient denies  ? Hyperlipidemia   ? Hypertension   ? Hypertrophy of prostate without urinary obstruction and other lower urinary tract symptoms (LUTS)   ? Lumbago   ? Nevus, non-neoplastic   ? Osteoarthrosis, unspecified whether generalized or localized, unspecified site   ? pt. denies  ? Other premature beats   ? Pain in limb   ? Plantar fascial fibromatosis   ? Special screening for malignant neoplasm of prostate   ? Unspecified glaucoma(365.9)   ? Unspecified pruritic disorder   ? Unspecified vitamin D deficiency   ? Urinary frequency   ? Zenker's diverticulum   ? ?Past Surgical History:  ?Procedure Laterality Date  ? CATARACT EXTRACTION BILATERAL W/ ANTERIOR VITRECTOMY Bilateral 1977  ? COLONOSCOPY  09/06/2013  ? [name]  ? DIRECT LARYNGOSCOPY  10/22/2015  ? Cervical esophagoscopy.  Endoscopic esophageal diverticulotomy.   ? [place]  ? GLAUCOMA SURGERY  1945  ? congenital glaucoma  ? HERNIA REPAIR  1993  ?  [place] REPAIR  2008  ? LARYNGOSCOPY    ? with stapling of zenkers diverticulum  ? LUMBAR LAMINECTOMY/ DECOMPRESSION WITH MET-RX Left 08/16/2019  ? Procedure: Left Lumbar Two-Three Minimally Invasive Laminectomy and Microdiscectomy;  Surgeon: Judith Part, MD;  Location: [place];  Service: Neurosurgery;  Laterality: Left;  Left Lumbar 2-3 Minimally invasive laminectomy and microdiscectomy  ? ?Patient Active Problem List  ? Diagnosis Date Noted  ? Central stenosis of spinal canal 05/26/2021  ? Actinic keratosis 06/16/2020  ? Bilateral leg weakness 06/16/2020  ? Onychomycosis 12/13/2019  ? Herniated lumbar intervertebral disc 08/16/2019  ? Degenerative lumbar spinal stenosis 02/14/2019  ? TSH elevation 02/25/2016  ? Orthostatic hypotension 02/17/2016  ? Syncope 01/07/2016  ? Esophageal diverticulum, acquired 10/22/2015  ? Xeroderma 08/20/2015  ? Bifascicular block 08/20/2015  ? Neuropathy ([place]) 02/05/2015  ? Diverticulosis of colon   ? Vitamin D deficiency   ? Hyperglycemia   ? Dysphagia   ? Diverticulum of esophagus, acquired   ? Hyperlipidemia   ? Glaucoma   ? BPH (benign prostatic hyperplasia)   ? Essential hypertension 02/01/2013  ? 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  ? ? ?REFERRING DIAG: R29.898 (ICD-10-CM) - Bilateral leg weakness ? ?THERAPY DIAG:  ?Pain, lumbar region ? ?Difficulty walking ? ?  Muscle weakness (generalized) ? ?PERTINENT HISTORY: Congenital glaucoma, Nov 2020 disc herniation surgery, vision deficits- lack of depth perception, and peripheral vision ? ?PRECAUTIONS: none ? ?SUBJECTIVE: "No pain in back, tightness seems to be better.  Some soreness in my legs (quad/hamstring area)" ? ?PAIN:  ?Are you having pain? No ? ? ?PRECAUTIONS: None ? ?WEIGHT BEARING RESTRICTIONS No ? ?FALLS:  ?Has patient fallen in last 6 months? No, Number of falls: 0 ? ?LIVING ENVIRONMENT: ?Lives with: lives with their family and lives with their spouse ?Lives in: House/apartment ?Stairs: Yes; 5- cane on steps, rail  on the L  ?Has following equipment at home: Single point cane and Walker - 4 wheeled ? ?OCCUPATION: retired  ? ?PLOF: Independent with household mobility with device ? ?PATIENT GOALS : improve LE weakness. "Walk one day without the walker" ? ? ?OBJECTIVE:  ? ?DIAGNOSTIC FINDINGS:  ?Lumbar spine: ?  ?1. Persistent prominent left subarticular zone inferiorly migrated ?extrusion at L2-L3 with severe spinal canal stenosis and impingement ?of the cauda equina nerve roots, particularly on the left. The ?extrusion is slightly smaller compared to the study from 2020. ?2. Left subarticular zone protrusion at L1-L2 with contact and ?possible impingement of the traversing left L2 nerve root, similar ?to the prior study. Otherwise, no high-grade spinal canal or neural ?foraminal stenosis in the lumbar spine. ?3. Multilevel facet arthropathy, most advanced at L4-L5, with ?bilateral facet joint effusions and perifacetal soft tissue edema at ?L2-L3 through L4-L5. ?4. Degenerative marrow signal abnormality at L1 through L3, ?progressed since 2020. ? ?PATIENT SURVEYS:  ?FOTO N/A ? ?SCREENING FOR RED FLAGS: ?Bowel or bladder incontinence: No ?Spinal tumors: No ?Cauda equina syndrome: No ?Compression fracture: No ? ? ?COGNITION: ? Overall cognitive status: Within functional limits for tasks assessed   ?  ?SENSATION: ? Light touch: Deficits  ?  ? ? ?POSTURE:  ?Kyphotic, and decrease lumbar lordosis  ? ?PALPATION: ?Significant atrophy in bilat thigh and shanks  ? ?LUMBARAROM/PROM: requires UE support in seated  ? ?A/PROM A/PROM  ?11/09/2021  ?Flexion 50%  ?Extension 0%  ?Right lateral flexion 25%  ?Left lateral flexion 25%  ?Right rotation 50%  ?Left rotation 50%  ? (Blank rows = not tested) ? ?LE AROM/PROM: moderate limitations into hip flexion, ER, and IR; bilat knees WFL- lacking full TKE but symmetrical bilaterally ? ?LE MMT: ? ?MMT Right ?11/09/2021 Left ?11/09/2021  ?Hip flexion 4+/5 4+/5  ?Hip extension    ?Hip abduction 4+/5 4+/5   ?Hip adduction 4+/5 4+/5  ?Hip internal rotation    ?Hip external rotation    ?Knee flexion 4+/5 4+/5  ?Knee extension 5/5 5/5  ?Ankle dorsiflexion 4/5 4/5  ?Ankle plantarflexion 3/5 3/5  ? (Blank rows = not tested) ? ? ?FUNCTIONAL TESTS:  ?TUG: 21s ?STS transfers: bilat UE require- one for push off and one for balance ?Standing balance: pt unable to perform 4 stage balance or marching without UE assistance ? ?GAIT: ?Distance walked: 6MWT 677f  ?Assistive device utilized: WEnvironmental consultant- 4 wheeled ?Level of assistance: Modified independence ?Comments: increased fwd trunk lean and UE pressure through walker as fatigue increased, cross step/ stumble with fatigue ? ? ? ?TODAY'S TREATMENT  ? ?12/22/21 ? ?Pt seen for aquatic therapy today.  Treatment took place in water 3.25-4.8 ft [facility] Temp of water was 91?.  Pt entered/exited the pool via stairs step to pattern independently with bilat rail.  ? ?Reviewed current function, pain levels, response to prior Rx,  and HEP compliance.   ? ?Walking using yellow hand buoys all depths Fwd, back and side stepping. Distant supervision.  Needs vc for maintaining straight path due to impaired vision. ? ? Seated ? -flutter 3x20 (resisted SLR) cues for increased speed to increase resistance ?            -add/abd 3x20 ? -STS from 3 step hha x1hand x10. Cues for weight shifting ?           ?Standing:  ?Gait training forward and back supported by yellow hand buoys. Cues for increased step length and heel strike/toe strike (fwd, then back) ?Hip flex and quad stretches manual 3x30s hold ?Holding to wall: hip extension, add/abd and flex x 10. Cues for upright posture ?Step ups bottom step unilateral ue support R/L x10 ? ? ?Pt requires buoyancy for support and to offload joints with strengthening exercises. Viscosity of the water is needed for resistance of strengthening; water current perturbations provides challenge to standing balance unsupported, requiring  increased core activation. ? ? ? ?PATIENT EDUCATION:  ?Education details: exercise progression ?Person educated: Patient ?Education method: Explanation, Demonstration, Tactile cues, Verbal cues, and Handouts

## 2021-12-24 ENCOUNTER — Ambulatory Visit (HOSPITAL_BASED_OUTPATIENT_CLINIC_OR_DEPARTMENT_OTHER): Payer: PPO | Admitting: Physical Therapy

## 2021-12-24 ENCOUNTER — Other Ambulatory Visit: Payer: Self-pay

## 2021-12-24 ENCOUNTER — Encounter (HOSPITAL_BASED_OUTPATIENT_CLINIC_OR_DEPARTMENT_OTHER): Payer: Self-pay | Admitting: Physical Therapy

## 2021-12-24 DIAGNOSIS — M545 Low back pain, unspecified: Secondary | ICD-10-CM | POA: Diagnosis not present

## 2021-12-24 DIAGNOSIS — M6281 Muscle weakness (generalized): Secondary | ICD-10-CM

## 2021-12-24 DIAGNOSIS — R262 Difficulty in walking, not elsewhere classified: Secondary | ICD-10-CM

## 2021-12-24 NOTE — Therapy (Signed)
?OUTPATIENT PHYSICAL THERAPY TREATMENT NOTE ? ? ?Patient Name: Kirk [name] ?MRN: [id number] ?DOB:1940/11/04, 81 y.o., male ?Today's Date: 12/24/2021 ? ?PCP: [name], MD ?REFERRING PROVIDER: [name], MD ? ? PT End of Session - 12/24/21 1455   ? ? Visit Number 9   ? Number of Visits 21   ? Date for PT Re-Evaluation 02/07/22   ? Authorization Type HTA   ? PT Start Time 1448   ? PT Stop Time 1530   ? PT Time Calculation (min) 42 min   ? Activity Tolerance Patient tolerated treatment well   ? Behavior During Therapy [facility] for tasks assessed/performed   ? ?  ?  ? ?  ? ? ? ? ? ?Past Medical History:  ?Diagnosis Date  ? Cataract   ? Congenital glaucoma   ? "both eyes operated on when I was 74 months old"  ? Cough   ? Disturbance of skin sensation   ? Diverticulosis of colon 2014  ? Diverticulum of esophagus, acquired   ? Dysphagia, unspecified(787.20)   ? Encounter for long-term (current) use of other medications   ? Herpes zoster without mention of complication   ? Hyperglycemia   ? patient denies  ? Hyperlipidemia   ? Hypertension   ? Hypertrophy of prostate without urinary obstruction and other lower urinary tract symptoms (LUTS)   ? Lumbago   ? Nevus, non-neoplastic   ? Osteoarthrosis, unspecified whether generalized or localized, unspecified site   ? pt. denies  ? Other premature beats   ? Pain in limb   ? Plantar fascial fibromatosis   ? Special screening for malignant neoplasm of prostate   ? Unspecified glaucoma(365.9)   ? Unspecified pruritic disorder   ? Unspecified vitamin D deficiency   ? Urinary frequency   ? Zenker's diverticulum   ? ?Past Surgical History:  ?Procedure Laterality Date  ? CATARACT EXTRACTION BILATERAL W/ ANTERIOR VITRECTOMY Bilateral 1977  ? COLONOSCOPY  09/06/2013  ? [name]  ? DIRECT LARYNGOSCOPY  10/22/2015  ? Cervical esophagoscopy.  Endoscopic esophageal diverticulotomy.   ? [place]  ? GLAUCOMA SURGERY  1945  ? congenital glaucoma  ? HERNIA REPAIR  1993  ?  [place] REPAIR  2008  ? LARYNGOSCOPY    ? with stapling of zenkers diverticulum  ? LUMBAR LAMINECTOMY/ DECOMPRESSION WITH MET-RX Left 08/16/2019  ? Procedure: Left Lumbar Two-Three Minimally Invasive Laminectomy and Microdiscectomy;  Surgeon: Judith Part, MD;  Location: [place];  Service: Neurosurgery;  Laterality: Left;  Left Lumbar 2-3 Minimally invasive laminectomy and microdiscectomy  ? ?Patient Active Problem List  ? Diagnosis Date Noted  ? Central stenosis of spinal canal 05/26/2021  ? Actinic keratosis 06/16/2020  ? Bilateral leg weakness 06/16/2020  ? Onychomycosis 12/13/2019  ? Herniated lumbar intervertebral disc 08/16/2019  ? Degenerative lumbar spinal stenosis 02/14/2019  ? TSH elevation 02/25/2016  ? Orthostatic hypotension 02/17/2016  ? Syncope 01/07/2016  ? Esophageal diverticulum, acquired 10/22/2015  ? Xeroderma 08/20/2015  ? Bifascicular block 08/20/2015  ? Neuropathy ([place]) 02/05/2015  ? Diverticulosis of colon   ? Vitamin D deficiency   ? Hyperglycemia   ? Dysphagia   ? Diverticulum of esophagus, acquired   ? Hyperlipidemia   ? Glaucoma   ? BPH (benign prostatic hyperplasia)   ? Essential hypertension 02/01/2013  ? 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  ? ? ?REFERRING DIAG: R29.898 (ICD-10-CM) - Bilateral leg weakness ? ?THERAPY DIAG:  ?Pain, lumbar region ? ?Difficulty walking ? ?  Muscle weakness (generalized) ? ?PERTINENT HISTORY: Congenital glaucoma, Nov 2020 disc herniation surgery, vision deficits- lack of depth perception, and peripheral vision ? ?PRECAUTIONS: none ? ?SUBJECTIVE: "tightness around hips and groin, no pain" ? ?PAIN:  ?Are you having pain? No ? ? ?PRECAUTIONS: None ? ?WEIGHT BEARING RESTRICTIONS No ? ?FALLS:  ?Has patient fallen in last 6 months? No, Number of falls: 0 ? ?LIVING ENVIRONMENT: ?Lives with: lives with their family and lives with their spouse ?Lives in: House/apartment ?Stairs: Yes; 5- cane on steps, rail on the L  ?Has following equipment at home: Single  point cane and Walker - 4 wheeled ? ?OCCUPATION: retired  ? ?PLOF: Independent with household mobility with device ? ?PATIENT GOALS : improve LE weakness. "Walk one day without the walker" ? ? ?OBJECTIVE:  ? ?DIAGNOSTIC FINDINGS:  ?Lumbar spine: ?  ?1. Persistent prominent left subarticular zone inferiorly migrated ?extrusion at L2-L3 with severe spinal canal stenosis and impingement ?of the cauda equina nerve roots, particularly on the left. The ?extrusion is slightly smaller compared to the study from 2020. ?2. Left subarticular zone protrusion at L1-L2 with contact and ?possible impingement of the traversing left L2 nerve root, similar ?to the prior study. Otherwise, no high-grade spinal canal or neural ?foraminal stenosis in the lumbar spine. ?3. Multilevel facet arthropathy, most advanced at L4-L5, with ?bilateral facet joint effusions and perifacetal soft tissue edema at ?L2-L3 through L4-L5. ?4. Degenerative marrow signal abnormality at L1 through L3, ?progressed since 2020. ? ?PATIENT SURVEYS:  ?FOTO N/A ? ?SCREENING FOR RED FLAGS: ?Bowel or bladder incontinence: No ?Spinal tumors: No ?Cauda equina syndrome: No ?Compression fracture: No ? ? ?COGNITION: ? Overall cognitive status: Within functional limits for tasks assessed   ?  ?SENSATION: ? Light touch: Deficits  ?  ? ? ?POSTURE:  ?Kyphotic, and decrease lumbar lordosis  ? ?PALPATION: ?Significant atrophy in bilat thigh and shanks  ? ?LUMBARAROM/PROM: requires UE support in seated  ? ?A/PROM A/PROM  ?11/09/2021  ?Flexion 50%  ?Extension 0%  ?Right lateral flexion 25%  ?Left lateral flexion 25%  ?Right rotation 50%  ?Left rotation 50%  ? (Blank rows = not tested) ? ?LE AROM/PROM: moderate limitations into hip flexion, ER, and IR; bilat knees WFL- lacking full TKE but symmetrical bilaterally ? ?LE MMT: ? ?MMT Right ?11/09/2021 Left ?11/09/2021  ?Hip flexion 4+/5 4+/5  ?Hip extension    ?Hip abduction 4+/5 4+/5  ?Hip adduction 4+/5 4+/5  ?Hip internal rotation     ?Hip external rotation    ?Knee flexion 4+/5 4+/5  ?Knee extension 5/5 5/5  ?Ankle dorsiflexion 4/5 4/5  ?Ankle plantarflexion 3/5 3/5  ? (Blank rows = not tested) ? ? ?FUNCTIONAL TESTS:  ?TUG: 21s ?STS transfers: bilat UE require- one for push off and one for balance ?Standing balance: pt unable to perform 4 stage balance or marching without UE assistance ? ?GAIT: ?Distance walked: 6MWT 661f  ?Assistive device utilized: WEnvironmental consultant- 4 wheeled ?Level of assistance: Modified independence ?Comments: increased fwd trunk lean and UE pressure through walker as fatigue increased, cross step/ stumble with fatigue ? ? ? ?TODAY'S TREATMENT  ? ?12/22/21 ? ?Pt seen for aquatic therapy today.  Treatment took place in water 3.25-4.8 ft [facility] Temp of water was 91?.  Pt entered/exited the pool via stairs step to pattern independently with bilat rail.  ? ?Reviewed current function, pain levels, response to prior Rx, and HEP compliance.   ? ?Walking using yellow hand  buoys all depths Fwd, back and side stepping. Distant supervision.  Needs vc for maintaining straight path due to impaired vision. ? ?Standing:  ?Gait training forward and back supported by blue hand buoys. ?Holding to wall unilaterally other with blue hand buoy:  marching, hip extension, add/abd and flex x 10.  ?Step ups bottom step unilateral ue support R/L x10 ?Blue hand buoys push downs 2 x15 ? ? Seated ?  -flutter 5x20-25 (resisted SLR) cues for increased speed to increase resistance ?            -add/abd 3x20 ? -STS from 3 step hha x2 hands x10 then x10  hha x1 hand. Cues for weight shifting ?           ?Pt requires buoyancy for support and to offload joints with strengthening exercises. Viscosity of the water is needed for resistance of strengthening; water current perturbations provides challenge to standing balance unsupported, requiring increased core activation. ? ? ? ?PATIENT EDUCATION:  ?Education details: exercise  progression ?Person educated: Patient ?Education method: Explanation, Demonstration, Tactile cues, Verbal cues, and Handouts ?Education comprehension: verbalized understanding, returned demonstration, verbal cues requ

## 2021-12-29 ENCOUNTER — Ambulatory Visit (HOSPITAL_BASED_OUTPATIENT_CLINIC_OR_DEPARTMENT_OTHER): Payer: PPO | Admitting: Physical Therapy

## 2021-12-29 ENCOUNTER — Encounter (HOSPITAL_BASED_OUTPATIENT_CLINIC_OR_DEPARTMENT_OTHER): Payer: Self-pay | Admitting: Physical Therapy

## 2021-12-29 ENCOUNTER — Other Ambulatory Visit: Payer: Self-pay

## 2021-12-29 DIAGNOSIS — R262 Difficulty in walking, not elsewhere classified: Secondary | ICD-10-CM

## 2021-12-29 DIAGNOSIS — M545 Low back pain, unspecified: Secondary | ICD-10-CM

## 2021-12-29 DIAGNOSIS — M6281 Muscle weakness (generalized): Secondary | ICD-10-CM

## 2021-12-29 NOTE — Therapy (Signed)
?OUTPATIENT PHYSICAL THERAPY TREATMENT NOTE ? ?Progress Note ?Reporting Period 11/09/21 to 12/29/21 ? ? ?See note below for Objective Data and Assessment of Progress/Goals.  ? ?  ? ? ?Patient Name: Kirk [name] ?MRN: [id number] ?DOB:1941/02/11, 81 y.o., male ?Today's Date: 12/29/2021 ? ?PCP: [name], MD ?REFERRING PROVIDER: [name], MD ? ? PT End of Session - 12/29/21 1556   ? ? Visit Number 10   ? Number of Visits 21   ? Date for PT Re-Evaluation 02/07/22   ? Authorization Type HTA   ? PT Start Time 1515   ? PT Stop Time 7711   ? PT Time Calculation (min) 32 min   ? Activity Tolerance Patient tolerated treatment well   ? Behavior During Therapy [facility] for tasks assessed/performed   ? ?  ?  ? ?  ? ? ? ? ? ? ?Past Medical History:  ?Diagnosis Date  ? Cataract   ? Congenital glaucoma   ? "both eyes operated on when I was 54 months old"  ? Cough   ? Disturbance of skin sensation   ? Diverticulosis of colon 2014  ? Diverticulum of esophagus, acquired   ? Dysphagia, unspecified(787.20)   ? Encounter for long-term (current) use of other medications   ? Herpes zoster without mention of complication   ? Hyperglycemia   ? patient denies  ? Hyperlipidemia   ? Hypertension   ? Hypertrophy of prostate without urinary obstruction and other lower urinary tract symptoms (LUTS)   ? Lumbago   ? Nevus, non-neoplastic   ? Osteoarthrosis, unspecified whether generalized or localized, unspecified site   ? pt. denies  ? Other premature beats   ? Pain in limb   ? Plantar fascial fibromatosis   ? Special screening for malignant neoplasm of prostate   ? Unspecified glaucoma(365.9)   ? Unspecified pruritic disorder   ? Unspecified vitamin D deficiency   ? Urinary frequency   ? Zenker's diverticulum   ? ?Past Surgical History:  ?Procedure Laterality Date  ? CATARACT EXTRACTION BILATERAL W/ ANTERIOR VITRECTOMY Bilateral 1977  ? COLONOSCOPY  09/06/2013  ? [name]  ? DIRECT LARYNGOSCOPY  10/22/2015  ? Cervical esophagoscopy.  Endoscopic  esophageal diverticulotomy.   ? [place]  ? GLAUCOMA SURGERY  1945  ? congenital glaucoma  ? HERNIA REPAIR  1993  ? [place] REPAIR  2008  ? LARYNGOSCOPY    ? with stapling of zenkers diverticulum  ? LUMBAR LAMINECTOMY/ DECOMPRESSION WITH MET-RX Left 08/16/2019  ? Procedure: Left Lumbar Two-Three Minimally Invasive Laminectomy and Microdiscectomy;  Surgeon: Judith Part, MD;  Location: [place];  Service: Neurosurgery;  Laterality: Left;  Left Lumbar 2-3 Minimally invasive laminectomy and microdiscectomy  ? ?Patient Active Problem List  ? Diagnosis Date Noted  ? Central stenosis of spinal canal 05/26/2021  ? Actinic keratosis 06/16/2020  ? Bilateral leg weakness 06/16/2020  ? Onychomycosis 12/13/2019  ? Herniated lumbar intervertebral disc 08/16/2019  ? Degenerative lumbar spinal stenosis 02/14/2019  ? TSH elevation 02/25/2016  ? Orthostatic hypotension 02/17/2016  ? Syncope 01/07/2016  ? Esophageal diverticulum, acquired 10/22/2015  ? Xeroderma 08/20/2015  ? Bifascicular block 08/20/2015  ? Neuropathy ([address]) 02/05/2015  ? Diverticulosis of colon   ? Vitamin D deficiency   ? Hyperglycemia   ? Dysphagia   ? Diverticulum of esophagus, acquired   ? Hyperlipidemia   ? Glaucoma   ? BPH (benign prostatic hyperplasia)   ? Essential hypertension 02/01/2013  ?  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  ? ? ?REFERRING DIAG: R29.898 (ICD-10-CM) - Bilateral leg weakness ? ?THERAPY DIAG:  ?Pain, lumbar region ? ?Muscle weakness (generalized) ? ?Difficulty walking ? ?PERTINENT HISTORY: Congenital glaucoma, Nov 2020 disc herniation surgery, vision deficits- lack of depth perception, and peripheral vision ? ?PRECAUTIONS: none ? ?SUBJECTIVE: Pt states he feels like his a little better. "I still can't stand or walk without the walker." He reports he cannot wean off to cane just yet. Pt is able to get the garbage can from the road nad back now.  ?PAIN:  ?Are you having pain? No ? ? ?PRECAUTIONS: None ? ?WEIGHT  BEARING RESTRICTIONS No ? ?FALLS:  ?Has patient fallen in last 6 months? No, Number of falls: 0 ? ?LIVING ENVIRONMENT: ?Lives with: lives with their family and lives with their spouse ?Lives in: House/apartment ?Stairs: Yes; 5- cane on steps, rail on the L  ?Has following equipment at home: Single point cane and Walker - 4 wheeled ? ?OCCUPATION: retired  ? ?PLOF: Independent with household mobility with device ? ?PATIENT GOALS : improve LE weakness. "Walk one day without the walker" ? ? ?OBJECTIVE:  ? ?DIAGNOSTIC FINDINGS:  ?Lumbar spine: ?  ?1. Persistent prominent left subarticular zone inferiorly migrated ?extrusion at L2-L3 with severe spinal canal stenosis and impingement ?of the cauda equina nerve roots, particularly on the left. The ?extrusion is slightly smaller compared to the study from 2020. ?2. Left subarticular zone protrusion at L1-L2 with contact and ?possible impingement of the traversing left L2 nerve root, similar ?to the prior study. Otherwise, no high-grade spinal canal or neural ?foraminal stenosis in the lumbar spine. ?3. Multilevel facet arthropathy, most advanced at L4-L5, with ?bilateral facet joint effusions and perifacetal soft tissue edema at ?L2-L3 through L4-L5. ?4. Degenerative marrow signal abnormality at L1 through L3, ?progressed since 2020. ? ?PATIENT SURVEYS:  ?FOTO N/A ? ?SENSATION: ? Light touch: Deficits  ?  ? ?LUMBARAROM/PROM: Able to sit without UE support, more upright posture ? ?A/PROM A/PROM  ?11/09/2021 3/28  ?Flexion 50% 50%  ?Extension 0% 0  ?Right lateral flexion 25% 25%  ?Left lateral flexion 25% 25%  ?Right rotation 50% 50%  ?Left rotation 50% 50%  ? (Blank rows = not tested) ? ?LE AROM/PROM: moderate limitations into hip flexion, ER, and IR; bilat knees WFL- lacking full TKE but symmetrical bilaterally ? ?LE MMT: ? ?MMT Right ?11/09/2021 Left ?11/09/2021 R 3/28 L 3/28  ?Hip flexion 4+/5 4+/5 4+/5 4+/5  ?Hip extension      ?Hip abduction 4+/5 4+/5 4+/5 4+/5  ?Hip adduction  4+/5 4+/5 5/5 5/5  ?Hip internal rotation      ?Hip external rotation      ?Knee flexion 4+/5 4+/5 4+/5 4+/5  ?Knee extension 5/5 5/5 5/5 5/5  ?Ankle dorsiflexion 4/5 4/5 4/5 4/5  ?Ankle plantarflexion 3/5 3/5 3/5 4-/5  ? (Blank rows = not tested) ? ? ?FUNCTIONAL TESTS:  ?Eval: ?TUG: 21s ?STS transfers: bilat UE require- one for push off and one for balance ?Standing balance: pt unable to perform 4 stage balance or marching without UE assistance ? ?3/28 ? ?TUG: 17.2 ?STS transfers: 5XSTS 17.7s (no UE needed) ?Standing balance: pt unable to perform 4 stage balance or marching without UE assistance ? ? ?GAIT: ?Distance walked: 6MWT 639f = 845 ?Assistive device utilized: WEnvironmental consultant- 4 wheeled ?Level of assistance: Modified independence ?Comments: increased fwd trunk lean and UE pressure through walker, able to complete 3 full laps without signficant fatigue, no crossover  stepping noted  ? ? ? ?TODAY'S TREATMENT  ?3/28 ? ?Review of HEP with focus on PF, walking distance, and improving transfers without UE ? ?12/22/21 ? ?Pt seen for aquatic therapy today.  Treatment took place in water 3.25-4.8 ft in depth at the [facility] pool. Temp of water was 91?.  Pt entered/exited the pool via stairs step to pattern independently with bilat rail.  ? ?Reviewed current function, pain levels, response to prior Rx, and HEP compliance.   ? ?Walking using yellow hand buoys all depths Fwd, back and side stepping. Distant supervision.  Needs vc for maintaining straight path due to impaired vision. ? ?Standing:  ?Gait training forward and back supported by blue hand buoys. ?Holding to wall unilaterally other with blue hand buoy:  marching, hip extension, add/abd and flex x 10.  ?Step ups bottom step unilateral ue support R/L x10 ?Blue hand buoys push downs 2 x15 ? ? Seated ?  -flutter 5x20-25 (resisted SLR) cues for increased speed to increase resistance ?            -add/abd 3x20 ? -STS from 3 step hha x2 hands x10 then x10  hha x1  hand. Cues for weight shifting ?           ?Pt requires buoyancy for support and to offload joints with strengthening exercises. Viscosity of the water is needed for resistance of strengthening; water curre

## 2021-12-31 ENCOUNTER — Ambulatory Visit (HOSPITAL_BASED_OUTPATIENT_CLINIC_OR_DEPARTMENT_OTHER): Payer: PPO | Admitting: Physical Therapy

## 2022-01-05 ENCOUNTER — Encounter (HOSPITAL_BASED_OUTPATIENT_CLINIC_OR_DEPARTMENT_OTHER): Payer: Self-pay | Admitting: Physical Therapy

## 2022-01-05 ENCOUNTER — Ambulatory Visit (HOSPITAL_BASED_OUTPATIENT_CLINIC_OR_DEPARTMENT_OTHER): Payer: PPO | Attending: Family Medicine | Admitting: Physical Therapy

## 2022-01-05 DIAGNOSIS — R262 Difficulty in walking, not elsewhere classified: Secondary | ICD-10-CM | POA: Insufficient documentation

## 2022-01-05 DIAGNOSIS — M6281 Muscle weakness (generalized): Secondary | ICD-10-CM | POA: Diagnosis not present

## 2022-01-05 DIAGNOSIS — M545 Low back pain, unspecified: Secondary | ICD-10-CM | POA: Diagnosis not present

## 2022-01-05 NOTE — Therapy (Signed)
?OUTPATIENT PHYSICAL THERAPY TREATMENT NOTE ? ?Progress Note ? ? ?  ? ? ?Patient Name: Kirk [name] ?MRN: [id number] ?DOB:04/26/1941, 81 y.o., male ?Today's Date: 01/05/2022 ? ?PCP: [name], MD ?REFERRING PROVIDER: [name], MD ? ? PT End of Session - 01/05/22 1452   ? ? Visit Number 11   ? Number of Visits 21   ? Date for PT Re-Evaluation 02/07/22   ? Authorization Type HTA   ? PT Start Time 1450   ? PT Stop Time 1529   ? PT Time Calculation (min) 39 min   ? Activity Tolerance Patient tolerated treatment well   ? Behavior During Therapy [facility] for tasks assessed/performed   ? ?  ?  ? ?  ? ? ? ? ? ? ?Past Medical History:  ?Diagnosis Date  ? Cataract   ? Congenital glaucoma   ? "both eyes operated on when I was 2 months old"  ? Cough   ? Disturbance of skin sensation   ? Diverticulosis of colon 2014  ? Diverticulum of esophagus, acquired   ? Dysphagia, unspecified(787.20)   ? Encounter for long-term (current) use of other medications   ? Herpes zoster without mention of complication   ? Hyperglycemia   ? patient denies  ? Hyperlipidemia   ? Hypertension   ? Hypertrophy of prostate without urinary obstruction and other lower urinary tract symptoms (LUTS)   ? Lumbago   ? Nevus, non-neoplastic   ? Osteoarthrosis, unspecified whether generalized or localized, unspecified site   ? pt. denies  ? Other premature beats   ? Pain in limb   ? Plantar fascial fibromatosis   ? Special screening for malignant neoplasm of prostate   ? Unspecified glaucoma(365.9)   ? Unspecified pruritic disorder   ? Unspecified vitamin D deficiency   ? Urinary frequency   ? Zenker's diverticulum   ? ?Past Surgical History:  ?Procedure Laterality Date  ? CATARACT EXTRACTION BILATERAL W/ ANTERIOR VITRECTOMY Bilateral 1977  ? COLONOSCOPY  09/06/2013  ? [name]  ? DIRECT LARYNGOSCOPY  10/22/2015  ? Cervical esophagoscopy.  Endoscopic esophageal diverticulotomy.   ? [place]  ? GLAUCOMA SURGERY  1945  ? congenital glaucoma  ?  HERNIA REPAIR  1993  ? [place] REPAIR  2008  ? LARYNGOSCOPY    ? with stapling of zenkers diverticulum  ? LUMBAR LAMINECTOMY/ DECOMPRESSION WITH MET-RX Left 08/16/2019  ? Procedure: Left Lumbar Two-Three Minimally Invasive Laminectomy and Microdiscectomy;  Surgeon: Judith Part, MD;  Location: [place];  Service: Neurosurgery;  Laterality: Left;  Left Lumbar 2-3 Minimally invasive laminectomy and microdiscectomy  ? ?Patient Active Problem List  ? Diagnosis Date Noted  ? Central stenosis of spinal canal 05/26/2021  ? Actinic keratosis 06/16/2020  ? Bilateral leg weakness 06/16/2020  ? Onychomycosis 12/13/2019  ? Herniated lumbar intervertebral disc 08/16/2019  ? Degenerative lumbar spinal stenosis 02/14/2019  ? TSH elevation 02/25/2016  ? Orthostatic hypotension 02/17/2016  ? Syncope 01/07/2016  ? Esophageal diverticulum, acquired 10/22/2015  ? Xeroderma 08/20/2015  ? Bifascicular block 08/20/2015  ? Neuropathy ([place]) 02/05/2015  ? Diverticulosis of colon   ? Vitamin D deficiency   ? Hyperglycemia   ? Dysphagia   ? Diverticulum of esophagus, acquired   ? Hyperlipidemia   ? Glaucoma   ? BPH (benign prostatic hyperplasia)   ? Essential hypertension 02/01/2013  ? 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  ? ? ?REFERRING DIAG: R29.898 (ICD-10-CM) - Bilateral leg weakness ? ?THERAPY DIAG:  ?  Pain, lumbar region ? ?Muscle weakness (generalized) ? ?Difficulty walking ? ?PERTINENT HISTORY: Congenital glaucoma, Nov 2020 disc herniation surgery, vision deficits- lack of depth perception, and peripheral vision ? ?PRECAUTIONS: none ? ?SUBJECTIVE: "I'm doing alright I guess, wife says so"  ?PAIN:  ?Are you having pain? No ? ? ?PRECAUTIONS: None ? ?WEIGHT BEARING RESTRICTIONS No ? ?FALLS:  ?Has patient fallen in last 6 months? No, Number of falls: 0 ? ?LIVING ENVIRONMENT: ?Lives with: lives with their family and lives with their spouse ?Lives in: House/apartment ?Stairs: Yes; 5- cane on steps, rail on the L  ?Has following equipment  at home: Single point cane and Walker - 4 wheeled ? ?OCCUPATION: retired  ? ?PLOF: Independent with household mobility with device ? ?PATIENT GOALS : improve LE weakness. "Walk one day without the walker" ? ? ?OBJECTIVE:  ? ?DIAGNOSTIC FINDINGS:  ?Lumbar spine: ?  ?1. Persistent prominent left subarticular zone inferiorly migrated ?extrusion at L2-L3 with severe spinal canal stenosis and impingement ?of the cauda equina nerve roots, particularly on the left. The ?extrusion is slightly smaller compared to the study from 2020. ?2. Left subarticular zone protrusion at L1-L2 with contact and ?possible impingement of the traversing left L2 nerve root, similar ?to the prior study. Otherwise, no high-grade spinal canal or neural ?foraminal stenosis in the lumbar spine. ?3. Multilevel facet arthropathy, most advanced at L4-L5, with ?bilateral facet joint effusions and perifacetal soft tissue edema at ?L2-L3 through L4-L5. ?4. Degenerative marrow signal abnormality at L1 through L3, ?progressed since 2020. ? ?PATIENT SURVEYS:  ?FOTO N/A ? ?SENSATION: ? Light touch: Deficits  ?  ? ?LUMBARAROM/PROM: Able to sit without UE support, more upright posture ? ?A/PROM A/PROM  ?11/09/2021 3/28  ?Flexion 50% 50%  ?Extension 0% 0  ?Right lateral flexion 25% 25%  ?Left lateral flexion 25% 25%  ?Right rotation 50% 50%  ?Left rotation 50% 50%  ? (Blank rows = not tested) ? ?LE AROM/PROM: moderate limitations into hip flexion, ER, and IR; bilat knees WFL- lacking full TKE but symmetrical bilaterally ? ?LE MMT: ? ?MMT Right ?11/09/2021 Left ?11/09/2021 R 3/28 L 3/28  ?Hip flexion 4+/5 4+/5 4+/5 4+/5  ?Hip extension      ?Hip abduction 4+/5 4+/5 4+/5 4+/5  ?Hip adduction 4+/5 4+/5 5/5 5/5  ?Hip internal rotation      ?Hip external rotation      ?Knee flexion 4+/5 4+/5 4+/5 4+/5  ?Knee extension 5/5 5/5 5/5 5/5  ?Ankle dorsiflexion 4/5 4/5 4/5 4/5  ?Ankle plantarflexion 3/5 3/5 3/5 4-/5  ? (Blank rows = not tested) ? ? ?FUNCTIONAL TESTS:   ?Eval: ?TUG: 21s ?STS transfers: bilat UE require- one for push off and one for balance ?Standing balance: pt unable to perform 4 stage balance or marching without UE assistance ? ?3/28 ? ?TUG: 17.2 ?STS transfers: 5XSTS 17.7s (no UE needed) ?Standing balance: pt unable to perform 4 stage balance or marching without UE assistance ? ? ?GAIT: ?Distance walked: 6MWT 67f = 845 ?Assistive device utilized: WEnvironmental consultant- 4 wheeled ?Level of assistance: Modified independence ?Comments: increased fwd trunk lean and UE pressure through walker, able to complete 3 full laps without signficant fatigue, no crossover stepping noted  ? ? ? ?TODAY'S TREATMENT  ?01/05/22 ? ?Pt seen for aquatic therapy today.  Treatment took place in water 3.25-4.8 ft [facility] Temp of water was 91?.  Pt entered/exited the pool via stairs step to pattern independently with bilat rail.  ? ?  Reviewed current function, pain levels, response to prior Rx, and HEP compliance.   ? ?Walking using hand buoys all depths Fwd, back and side stepping. Distant supervision.   ? ?Standing:  ?Holding to wall:  marching, hip extension, add/abd and flex x 10.  ?-df 3x10 42f and bottom step ?Manual hip and quad stretching 3x30s ?UE shoulder horizontal abd/add; shoulder flex; shoulder abd/add x10 ? ? Seated ?On noodle cycling x 8 minutes moderate to TC for confidence and safety ?Jumping forward and back x 2 widths ?  -flutter 5x20-25 (resisted SLR) cues for increased speed to increase resistance ?             ? Balance challenge: small BOS, ue supported by hand buoys head movements laterally and vertically ?           ?Pt requires buoyancy for support and to offload joints with strengthening exercises. Viscosity of the water is needed for resistance of strengthening; water current perturbations provides challenge to standing balance unsupported, requiring increased core activation. ? ? ? ?PATIENT EDUCATION:  ?Education details: exam findings,  exercise progression, muscle firing,  envelope of function, HEP, POC ? ?Person educated: Patient ?Education method: Explanation, Demonstration, Tactile cues, Verbal cues, and Handouts ?Education comprehension:

## 2022-01-20 ENCOUNTER — Ambulatory Visit: Payer: PPO | Admitting: Endocrinology

## 2022-01-28 ENCOUNTER — Ambulatory Visit (HOSPITAL_BASED_OUTPATIENT_CLINIC_OR_DEPARTMENT_OTHER): Payer: PPO | Admitting: Physical Therapy

## 2022-01-28 ENCOUNTER — Encounter (HOSPITAL_BASED_OUTPATIENT_CLINIC_OR_DEPARTMENT_OTHER): Payer: Self-pay | Admitting: Physical Therapy

## 2022-01-28 DIAGNOSIS — M545 Low back pain, unspecified: Secondary | ICD-10-CM

## 2022-01-28 DIAGNOSIS — R262 Difficulty in walking, not elsewhere classified: Secondary | ICD-10-CM

## 2022-01-28 DIAGNOSIS — M6281 Muscle weakness (generalized): Secondary | ICD-10-CM

## 2022-01-28 NOTE — Therapy (Signed)
?OUTPATIENT PHYSICAL THERAPY TREATMENT NOTE ? ?Progress Note ? ? ?  ? ? ?Patient Name: Kirk [name] ?MRN: [id number] ?DOB:1941/05/19, 81 y.o., male ?Today's Date: 01/28/2022 ? ?PCP: [name], MD ?REFERRING PROVIDER: [name], MD ? ? PT End of Session - 01/28/22 1620   ? ? Visit Number 12   ? Number of Visits 21   ? Date for PT Re-Evaluation 02/07/22   ? Authorization Type HTA   ? PT Start Time 4854   ? PT Stop Time 1700   ? PT Time Calculation (min) 43 min   ? Activity Tolerance Patient tolerated treatment well   ? Behavior During Therapy [facility] for tasks assessed/performed   ? ?  ?  ? ?  ? ? ? ? ? ? ?Past Medical History:  ?Diagnosis Date  ? Cataract   ? Congenital glaucoma   ? "both eyes operated on when I was 51 months old"  ? Cough   ? Disturbance of skin sensation   ? Diverticulosis of colon 2014  ? Diverticulum of esophagus, acquired   ? Dysphagia, unspecified(787.20)   ? Encounter for long-term (current) use of other medications   ? Herpes zoster without mention of complication   ? Hyperglycemia   ? patient denies  ? Hyperlipidemia   ? Hypertension   ? Hypertrophy of prostate without urinary obstruction and other lower urinary tract symptoms (LUTS)   ? Lumbago   ? Nevus, non-neoplastic   ? Osteoarthrosis, unspecified whether generalized or localized, unspecified site   ? pt. denies  ? Other premature beats   ? Pain in limb   ? Plantar fascial fibromatosis   ? Special screening for malignant neoplasm of prostate   ? Unspecified glaucoma(365.9)   ? Unspecified pruritic disorder   ? Unspecified vitamin D deficiency   ? Urinary frequency   ? Zenker's diverticulum   ? ?Past Surgical History:  ?Procedure Laterality Date  ? CATARACT EXTRACTION BILATERAL W/ ANTERIOR VITRECTOMY Bilateral 1977  ? COLONOSCOPY  09/06/2013  ? [name]  ? DIRECT LARYNGOSCOPY  10/22/2015  ? Cervical esophagoscopy.  Endoscopic esophageal diverticulotomy.   ? [place]  ? GLAUCOMA SURGERY  1945  ? congenital glaucoma   ? HERNIA REPAIR  1993  ? [place] REPAIR  2008  ? LARYNGOSCOPY    ? with stapling of zenkers diverticulum  ? LUMBAR LAMINECTOMY/ DECOMPRESSION WITH MET-RX Left 08/16/2019  ? Procedure: Left Lumbar Two-Three Minimally Invasive Laminectomy and Microdiscectomy;  Surgeon: Judith Part, MD;  Location: [place];  Service: Neurosurgery;  Laterality: Left;  Left Lumbar 2-3 Minimally invasive laminectomy and microdiscectomy  ? ?Patient Active Problem List  ? Diagnosis Date Noted  ? Central stenosis of spinal canal 05/26/2021  ? Actinic keratosis 06/16/2020  ? Bilateral leg weakness 06/16/2020  ? Onychomycosis 12/13/2019  ? Herniated lumbar intervertebral disc 08/16/2019  ? Degenerative lumbar spinal stenosis 02/14/2019  ? TSH elevation 02/25/2016  ? Orthostatic hypotension 02/17/2016  ? Syncope 01/07/2016  ? Esophageal diverticulum, acquired 10/22/2015  ? Xeroderma 08/20/2015  ? Bifascicular block 08/20/2015  ? Neuropathy ([place]) 02/05/2015  ? Diverticulosis of colon   ? Vitamin D deficiency   ? Hyperglycemia   ? Dysphagia   ? Diverticulum of esophagus, acquired   ? Hyperlipidemia   ? Glaucoma   ? BPH (benign prostatic hyperplasia)   ? Essential hypertension 02/01/2013  ? 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  ? ? ?REFERRING DIAG: R29.898 (ICD-10-CM) - Bilateral leg weakness ? ?THERAPY DIAG:  ?  Pain, lumbar region ? ?Muscle weakness (generalized) ? ?Difficulty walking ? ?PERTINENT HISTORY: Congenital glaucoma, Nov 2020 disc herniation surgery, vision deficits- lack of depth perception, and peripheral vision ? ?PRECAUTIONS: none ? ?SUBJECTIVE: "Took me awhile to get an appointment but I am scheduled out through May.  I feel pretty good, nothing has changed"  ?PAIN:  ?Are you having pain? No ? ? ?PRECAUTIONS: None ? ?WEIGHT BEARING RESTRICTIONS No ? ?FALLS:  ?Has patient fallen in last 6 months? No, Number of falls: 0 ? ?LIVING ENVIRONMENT: ?Lives with: lives with their family and lives with their spouse ?Lives in:  House/apartment ?Stairs: Yes; 5- cane on steps, rail on the L  ?Has following equipment at home: Single point cane and Walker - 4 wheeled ? ?OCCUPATION: retired  ? ?PLOF: Independent with household mobility with device ? ?PATIENT GOALS : improve LE weakness. "Walk one day without the walker" ? ? ?OBJECTIVE:  ? ?DIAGNOSTIC FINDINGS:  ?Lumbar spine: ?  ?1. Persistent prominent left subarticular zone inferiorly migrated ?extrusion at L2-L3 with severe spinal canal stenosis and impingement ?of the cauda equina nerve roots, particularly on the left. The ?extrusion is slightly smaller compared to the study from 2020. ?2. Left subarticular zone protrusion at L1-L2 with contact and ?possible impingement of the traversing left L2 nerve root, similar ?to the prior study. Otherwise, no high-grade spinal canal or neural ?foraminal stenosis in the lumbar spine. ?3. Multilevel facet arthropathy, most advanced at L4-L5, with ?bilateral facet joint effusions and perifacetal soft tissue edema at ?L2-L3 through L4-L5. ?4. Degenerative marrow signal abnormality at L1 through L3, ?progressed since 2020. ? ?PATIENT SURVEYS:  ?FOTO N/A ? ?SENSATION: ? Light touch: Deficits  ?  ? ?LUMBARAROM/PROM: Able to sit without UE support, more upright posture ? ?A/PROM A/PROM  ?11/09/2021 3/28  ?Flexion 50% 50%  ?Extension 0% 0  ?Right lateral flexion 25% 25%  ?Left lateral flexion 25% 25%  ?Right rotation 50% 50%  ?Left rotation 50% 50%  ? (Blank rows = not tested) ? ?LE AROM/PROM: moderate limitations into hip flexion, ER, and IR; bilat knees WFL- lacking full TKE but symmetrical bilaterally ? ?LE MMT: ? ?MMT Right ?11/09/2021 Left ?11/09/2021 R 3/28 L 3/28  ?Hip flexion 4+/5 4+/5 4+/5 4+/5  ?Hip extension      ?Hip abduction 4+/5 4+/5 4+/5 4+/5  ?Hip adduction 4+/5 4+/5 5/5 5/5  ?Hip internal rotation      ?Hip external rotation      ?Knee flexion 4+/5 4+/5 4+/5 4+/5  ?Knee extension 5/5 5/5 5/5 5/5  ?Ankle dorsiflexion 4/5 4/5 4/5 4/5  ?Ankle  plantarflexion 3/5 3/5 3/5 4-/5  ? (Blank rows = not tested) ? ? ?FUNCTIONAL TESTS:  ?Eval: ?TUG: 21s ?STS transfers: bilat UE require- one for push off and one for balance ?Standing balance: pt unable to perform 4 stage balance or marching without UE assistance ? ?3/28 ? ?TUG: 17.2 ?STS transfers: 5XSTS 17.7s (no UE needed) ?Standing balance: pt unable to perform 4 stage balance or marching without UE assistance ? ? ?GAIT: ?Distance walked: 6MWT 62f = 845 ?Assistive device utilized: WEnvironmental consultant- 4 wheeled ?Level of assistance: Modified independence ?Comments: increased fwd trunk lean and UE pressure through walker, able to complete 3 full laps without signficant fatigue, no crossover stepping noted  ? ? ? ?TODAY'S TREATMENT  ?01/28/22 ? ?Pt seen for aquatic therapy today.  Treatment took place in water 3.25-4.[facility] ft [facility] Temp of water was 91?.Marland Kitchen  Pt entered/exited the pool via stairs step to pattern independently with bilat rail.  ? ?Reviewed current function, pain levels, response to prior Rx, and HEP compliance.   ? ?Walking using hand buoys all depths Fwd, back and side stepping. Distant supervision.   ? ?Standing:  ?Holding to wall: DF/PF; marching, hip extension, add/abd and flex x 10.  ?Manual hip and quad stretching 3x30s ? ? ?  Seated ?  Flutter kicking 3x20, add/abd 3x20  (aerobic capacity and strengthening) ?             STS 3rd step. Cues for weight shifting and immediate standing balance.  Decreasing ue support to unilateral  ?On noodle cycling x10 minutes.   ?Jumping forward and back x 2 widths ?  -flutter 5x20-25 (resisted SLR) cues for increased speed to increase resistance ?             ? Balance challenge: small BOS, ue supported by hand buoys head movements laterally and vertically ?           ?Pt requires buoyancy for support and to offload joints with strengthening exercises. Viscosity of the water is needed for resistance of strengthening; water current  perturbations provides challenge to standing balance unsupported, requiring increased core activation. ? ? ? ?PATIENT EDUCATION:  ?Education details: exam findings, exercise progression, muscle firing,  envelope of function, HEP

## 2022-02-02 ENCOUNTER — Ambulatory Visit (HOSPITAL_BASED_OUTPATIENT_CLINIC_OR_DEPARTMENT_OTHER): Payer: PPO | Attending: Family Medicine | Admitting: Physical Therapy

## 2022-02-02 ENCOUNTER — Encounter (HOSPITAL_BASED_OUTPATIENT_CLINIC_OR_DEPARTMENT_OTHER): Payer: Self-pay | Admitting: Physical Therapy

## 2022-02-02 DIAGNOSIS — M545 Low back pain, unspecified: Secondary | ICD-10-CM | POA: Diagnosis not present

## 2022-02-02 DIAGNOSIS — M5451 Vertebrogenic low back pain: Secondary | ICD-10-CM | POA: Insufficient documentation

## 2022-02-02 DIAGNOSIS — R262 Difficulty in walking, not elsewhere classified: Secondary | ICD-10-CM | POA: Diagnosis not present

## 2022-02-02 DIAGNOSIS — M6281 Muscle weakness (generalized): Secondary | ICD-10-CM | POA: Diagnosis not present

## 2022-02-02 NOTE — Therapy (Signed)
?OUTPATIENT PHYSICAL THERAPY TREATMENT NOTE ? ? ?  ? ? ?Patient Name: Kirk [name] ?MRN: [id number] ?DOB:Jun 14, 1941, 81 y.o., male ?Today's Date: 02/02/2022 ? ?PCP: [name], MD ?REFERRING PROVIDER: [name], MD ? ? PT End of Session - 02/02/22 6754   ? ? Visit Number 13   ? Number of Visits 21   ? Date for PT Re-Evaluation 02/07/22   ? Authorization Type HTA   ? PT Start Time 4920   ? PT Stop Time 1028   ? PT Time Calculation (min) 43 min   ? Activity Tolerance Patient tolerated treatment well   ? Behavior During Therapy [facility] for tasks assessed/performed   ? ?  ?  ? ?  ? ? ? ? ? ? ?Past Medical History:  ?Diagnosis Date  ? Cataract   ? Congenital glaucoma   ? "both eyes operated on when I was 55 months old"  ? Cough   ? Disturbance of skin sensation   ? Diverticulosis of colon 2014  ? Diverticulum of esophagus, acquired   ? Dysphagia, unspecified(787.20)   ? Encounter for long-term (current) use of other medications   ? Herpes zoster without mention of complication   ? Hyperglycemia   ? patient denies  ? Hyperlipidemia   ? Hypertension   ? Hypertrophy of prostate without urinary obstruction and other lower urinary tract symptoms (LUTS)   ? Lumbago   ? Nevus, non-neoplastic   ? Osteoarthrosis, unspecified whether generalized or localized, unspecified site   ? pt. denies  ? Other premature beats   ? Pain in limb   ? Plantar fascial fibromatosis   ? Special screening for malignant neoplasm of prostate   ? Unspecified glaucoma(365.9)   ? Unspecified pruritic disorder   ? Unspecified vitamin D deficiency   ? Urinary frequency   ? Zenker's diverticulum   ? ?Past Surgical History:  ?Procedure Laterality Date  ? CATARACT EXTRACTION BILATERAL W/ ANTERIOR VITRECTOMY Bilateral 1977  ? COLONOSCOPY  09/06/2013  ? [name]  ? DIRECT LARYNGOSCOPY  10/22/2015  ? Cervical esophagoscopy.  Endoscopic esophageal diverticulotomy.   ? [place]  ? GLAUCOMA SURGERY  1945  ? congenital glaucoma  ? HERNIA REPAIR   1993  ? [place] REPAIR  2008  ? LARYNGOSCOPY    ? with stapling of zenkers diverticulum  ? LUMBAR LAMINECTOMY/ DECOMPRESSION WITH MET-RX Left 08/16/2019  ? Procedure: Left Lumbar Two-Three Minimally Invasive Laminectomy and Microdiscectomy;  Surgeon: Judith Part, MD;  Location: [place];  Service: Neurosurgery;  Laterality: Left;  Left Lumbar 2-3 Minimally invasive laminectomy and microdiscectomy  ? ?Patient Active Problem List  ? Diagnosis Date Noted  ? Central stenosis of spinal canal 05/26/2021  ? Actinic keratosis 06/16/2020  ? Bilateral leg weakness 06/16/2020  ? Onychomycosis 12/13/2019  ? Herniated lumbar intervertebral disc 08/16/2019  ? Degenerative lumbar spinal stenosis 02/14/2019  ? TSH elevation 02/25/2016  ? Orthostatic hypotension 02/17/2016  ? Syncope 01/07/2016  ? Esophageal diverticulum, acquired 10/22/2015  ? Xeroderma 08/20/2015  ? Bifascicular block 08/20/2015  ? Neuropathy ([place]) 02/05/2015  ? Diverticulosis of colon   ? Vitamin D deficiency   ? Hyperglycemia   ? Dysphagia   ? Diverticulum of esophagus, acquired   ? Hyperlipidemia   ? Glaucoma   ? BPH (benign prostatic hyperplasia)   ? Essential hypertension 02/01/2013  ? 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  ? ? ?REFERRING DIAG: R29.898 (ICD-10-CM) - Bilateral leg weakness ? ?THERAPY DIAG:  ?Pain, lumbar  region ? ?Muscle weakness (generalized) ? ?Difficulty walking ? ?PERTINENT HISTORY: Congenital glaucoma, Nov 2020 disc herniation surgery, vision deficits- lack of depth perception, and peripheral vision ? ?PRECAUTIONS: none ? ?SUBJECTIVE: "no tiredness or discomfort"  ?PAIN:  ?Are you having pain? Slight across LB 1/10 ? ? ?PRECAUTIONS: None ? ?WEIGHT BEARING RESTRICTIONS No ? ?FALLS:  ?Has patient fallen in last 6 months? No, Number of falls: 0 ? ?LIVING ENVIRONMENT: ?Lives with: lives with their family and lives with their spouse ?Lives in: House/apartment ?Stairs: Yes; 5- cane on steps, rail on the L  ?Has following equipment at home:  Single point cane and Walker - 4 wheeled ? ?OCCUPATION: retired  ? ?PLOF: Independent with household mobility with device ? ?PATIENT GOALS : improve LE weakness. "Walk one day without the walker" ? ? ?OBJECTIVE:  ? ?DIAGNOSTIC FINDINGS:  ?Lumbar spine: ?  ?1. Persistent prominent left subarticular zone inferiorly migrated ?extrusion at L2-L3 with severe spinal canal stenosis and impingement ?of the cauda equina nerve roots, particularly on the left. The ?extrusion is slightly smaller compared to the study from 2020. ?2. Left subarticular zone protrusion at L1-L2 with contact and ?possible impingement of the traversing left L2 nerve root, similar ?to the prior study. Otherwise, no high-grade spinal canal or neural ?foraminal stenosis in the lumbar spine. ?3. Multilevel facet arthropathy, most advanced at L4-L5, with ?bilateral facet joint effusions and perifacetal soft tissue edema at ?L2-L3 through L4-L5. ?4. Degenerative marrow signal abnormality at L1 through L3, ?progressed since 2020. ? ?PATIENT SURVEYS:  ?FOTO N/A ? ?SENSATION: ? Light touch: Deficits  ?  ? ?LUMBARAROM/PROM: Able to sit without UE support, more upright posture ? ?A/PROM A/PROM  ?11/09/2021 3/28  ?Flexion 50% 50%  ?Extension 0% 0  ?Right lateral flexion 25% 25%  ?Left lateral flexion 25% 25%  ?Right rotation 50% 50%  ?Left rotation 50% 50%  ? (Blank rows = not tested) ? ?LE AROM/PROM: moderate limitations into hip flexion, ER, and IR; bilat knees WFL- lacking full TKE but symmetrical bilaterally ? ?LE MMT: ? ?MMT Right ?11/09/2021 Left ?11/09/2021 R 3/28 L 3/28 R ?02/02/22 L ?02/02/22  ?Hip flexion 4+/5 4+/5 4+/5 4+/5 5/5 5/5  ?Hip extension        ?Hip abduction 4+/5 4+/5 4+/5 4+/5    ?Hip adduction 4+/5 4+/5 5/5 5/5    ?Hip internal rotation        ?Hip external rotation        ?Knee flexion 4+/5 4+/5 4+/5 4+/5 5/5 5/5  ?Knee extension 5/5 5/5 5/5 5/5    ?Ankle dorsiflexion 4/5 4/5 4/5 4/5    ?Ankle plantarflexion 3/5 3/5 3/5 4-/5    ? (Blank rows = not  tested) ? ? ?FUNCTIONAL TESTS:  ?Eval: ?TUG: 21s ?STS transfers: bilat UE require- one for push off and one for balance ?Standing balance: pt unable to perform 4 stage balance or marching without UE assistance ? ?3/28 ? ?TUG: 17.2 ?STS transfers: 5XSTS 17.7s (no UE needed) ?Standing balance: pt unable to perform 4 stage balance or marching without UE assistance ? ? ?GAIT: ?Distance walked: 6MWT 631f = 845 ?Assistive device utilized: WEnvironmental consultant- 4 wheeled ?Level of assistance: Modified independence ?Comments: increased fwd trunk lean and UE pressure through walker, able to complete 3 full laps without signficant fatigue, no crossover stepping noted  ? ? ? ?TODAY'S TREATMENT  ?02/02/22 ? ?Pt seen for aquatic therapy today.  Treatment took place in water 3.25-4.8 ft in depth at the [facility]  pool. Temp of water was 92?.  Pt entered/exited the pool via stairs step to pattern independently with bilat rail.  ? ? ?Walking using yellow noodle all depths Fwd, back and side stepping. Distant supervision.   ? ?Seated ?  Flutter kicking 3x20, add/abd 3x20  (aerobic capacity and strengthening) ?STS 3rd step. Cues for weight shifting forward and immediate standing balance. HHA, Decreasing ue support to unilateral.  Attempted no UE support, pt unable then hands on knees pt unable ?Water bench: STS initially with yellow noodle progressed to HHA, unilateral hha, then without.  Cues for forward weight shift, pt consistently losing balance backward ?Standing ? 4 ft R/L step tapping x10 ue supported by noodle ? Unilateral ue support of wall stepping forward onto then off of step x10 ? Attempts to complete with ue supported on noodle then hha.  Pt with fear avoidance/unable to complete ?           ?Pt requires buoyancy for support and to offload joints with strengthening exercises. Viscosity of the water is needed for resistance of strengthening; water current perturbations provides challenge to standing balance unsupported,  requiring increased core activation. ? ? ? ?PATIENT EDUCATION:  ?Education details: exam findings, exercise progression, muscle firing,  envelope of function, HEP, POC ? ?Person educated: Patient ?Vernelle Emerald

## 2022-02-04 ENCOUNTER — Ambulatory Visit (HOSPITAL_BASED_OUTPATIENT_CLINIC_OR_DEPARTMENT_OTHER): Payer: PPO | Admitting: Physical Therapy

## 2022-02-04 ENCOUNTER — Encounter (HOSPITAL_BASED_OUTPATIENT_CLINIC_OR_DEPARTMENT_OTHER): Payer: Self-pay | Admitting: Physical Therapy

## 2022-02-04 DIAGNOSIS — M545 Low back pain, unspecified: Secondary | ICD-10-CM | POA: Diagnosis not present

## 2022-02-04 DIAGNOSIS — R262 Difficulty in walking, not elsewhere classified: Secondary | ICD-10-CM

## 2022-02-04 DIAGNOSIS — M6281 Muscle weakness (generalized): Secondary | ICD-10-CM

## 2022-02-04 NOTE — Therapy (Signed)
?OUTPATIENT PHYSICAL THERAPY TREATMENT NOTE ? ? ?  ? ? ?Patient Name: Kirk [name] ?MRN: [id number] ?DOB:22-Jun-1941, 81 y.o., male ?Today's Date: 02/04/2022 ? ?PCP: [name], MD ?REFERRING PROVIDER: [name], MD ? ? PT End of Session - 02/04/22 1623   ? ? Visit Number 14   ? Number of Visits 21   ? Date for PT Re-Evaluation 02/07/22   ? Authorization Type HTA   ? PT Start Time 7416   ? PT Stop Time 1700   ? PT Time Calculation (min) 39 min   ? Activity Tolerance Patient tolerated treatment well   ? Behavior During Therapy [facility] for tasks assessed/performed   ? ?  ?  ? ?  ? ? ? ? ? ? ?Past Medical History:  ?Diagnosis Date  ? Cataract   ? Congenital glaucoma   ? "both eyes operated on when I was 72 months old"  ? Cough   ? Disturbance of skin sensation   ? Diverticulosis of colon 2014  ? Diverticulum of esophagus, acquired   ? Dysphagia, unspecified(787.20)   ? Encounter for long-term (current) use of other medications   ? Herpes zoster without mention of complication   ? Hyperglycemia   ? patient denies  ? Hyperlipidemia   ? Hypertension   ? Hypertrophy of prostate without urinary obstruction and other lower urinary tract symptoms (LUTS)   ? Lumbago   ? Nevus, non-neoplastic   ? Osteoarthrosis, unspecified whether generalized or localized, unspecified site   ? pt. denies  ? Other premature beats   ? Pain in limb   ? Plantar fascial fibromatosis   ? Special screening for malignant neoplasm of prostate   ? Unspecified glaucoma(365.9)   ? Unspecified pruritic disorder   ? Unspecified vitamin D deficiency   ? Urinary frequency   ? Zenker's diverticulum   ? ?Past Surgical History:  ?Procedure Laterality Date  ? CATARACT EXTRACTION BILATERAL W/ ANTERIOR VITRECTOMY Bilateral 1977  ? COLONOSCOPY  09/06/2013  ? [name]  ? DIRECT LARYNGOSCOPY  10/22/2015  ? Cervical esophagoscopy.  Endoscopic esophageal diverticulotomy.   ? [place]  ? GLAUCOMA SURGERY  1945  ? congenital glaucoma  ? HERNIA REPAIR   1993  ? [place] REPAIR  2008  ? LARYNGOSCOPY    ? with stapling of zenkers diverticulum  ? LUMBAR LAMINECTOMY/ DECOMPRESSION WITH MET-RX Left 08/16/2019  ? Procedure: Left Lumbar Two-Three Minimally Invasive Laminectomy and Microdiscectomy;  Surgeon: Judith Part, MD;  Location: [place];  Service: Neurosurgery;  Laterality: Left;  Left Lumbar 2-3 Minimally invasive laminectomy and microdiscectomy  ? ?Patient Active Problem List  ? Diagnosis Date Noted  ? Central stenosis of spinal canal 05/26/2021  ? Actinic keratosis 06/16/2020  ? Bilateral leg weakness 06/16/2020  ? Onychomycosis 12/13/2019  ? Herniated lumbar intervertebral disc 08/16/2019  ? Degenerative lumbar spinal stenosis 02/14/2019  ? TSH elevation 02/25/2016  ? Orthostatic hypotension 02/17/2016  ? Syncope 01/07/2016  ? Esophageal diverticulum, acquired 10/22/2015  ? Xeroderma 08/20/2015  ? Bifascicular block 08/20/2015  ? Neuropathy ([place]) 02/05/2015  ? Diverticulosis of colon   ? Vitamin D deficiency   ? Hyperglycemia   ? Dysphagia   ? Diverticulum of esophagus, acquired   ? Hyperlipidemia   ? Glaucoma   ? BPH (benign prostatic hyperplasia)   ? Essential hypertension 02/01/2013  ? 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  ? ? ?REFERRING DIAG: R29.898 (ICD-10-CM) - Bilateral leg weakness ? ?THERAPY DIAG:  ?Muscle weakness (  generalized) ? ?Difficulty walking ? ?PERTINENT HISTORY: Congenital glaucoma, Nov 2020 disc herniation surgery, vision deficits- lack of depth perception, and peripheral vision ? ?PRECAUTIONS: none ? ?SUBJECTIVE: "Things are about the same" "I still can't walk on my own (without rollator)".  Per his wife, she thinks he's getting a little stronger.   ? ?PAIN:  ?Are you having pain? no ?0/10 ? ? ?PRECAUTIONS: None ? ?WEIGHT BEARING RESTRICTIONS No ? ?FALLS:  ?Has patient fallen in last 6 months? No, Number of falls: 0 ? ?LIVING ENVIRONMENT: ?Lives with: lives with their family and lives with their spouse ?Lives in:  House/apartment ?Stairs: Yes; 5- cane on steps, rail on the L  ?Has following equipment at home: Single point cane and Walker - 4 wheeled ? ?OCCUPATION: retired  ? ?PLOF: Independent with household mobility with device ? ?PATIENT GOALS : improve LE weakness. "Walk one day without the walker" ? ? ?OBJECTIVE:  ? ?DIAGNOSTIC FINDINGS:  ?Lumbar spine: ?  ?1. Persistent prominent left subarticular zone inferiorly migrated ?extrusion at L2-L3 with severe spinal canal stenosis and impingement ?of the cauda equina nerve roots, particularly on the left. The ?extrusion is slightly smaller compared to the study from 2020. ?2. Left subarticular zone protrusion at L1-L2 with contact and ?possible impingement of the traversing left L2 nerve root, similar ?to the prior study. Otherwise, no high-grade spinal canal or neural ?foraminal stenosis in the lumbar spine. ?3. Multilevel facet arthropathy, most advanced at L4-L5, with ?bilateral facet joint effusions and perifacetal soft tissue edema at ?L2-L3 through L4-L5. ?4. Degenerative marrow signal abnormality at L1 through L3, ?progressed since 2020. ? ?PATIENT SURVEYS:  ?FOTO N/A ? ?SENSATION: ? Light touch: Deficits  ?  ? ?LUMBARAROM/PROM: Able to sit without UE support, more upright posture ? ?A/PROM A/PROM  ?11/09/2021 3/28  ?Flexion 50% 50%  ?Extension 0% 0  ?Right lateral flexion 25% 25%  ?Left lateral flexion 25% 25%  ?Right rotation 50% 50%  ?Left rotation 50% 50%  ? (Blank rows = not tested) ? ?LE AROM/PROM: moderate limitations into hip flexion, ER, and IR; bilat knees WFL- lacking full TKE but symmetrical bilaterally ? ?LE MMT: ? ?MMT Right ?11/09/2021 Left ?11/09/2021 R 3/28 L 3/28 R ?02/02/22 L ?02/02/22  ?Hip flexion 4+/5 4+/5 4+/5 4+/5 5/5 5/5  ?Hip extension        ?Hip abduction 4+/5 4+/5 4+/5 4+/5    ?Hip adduction 4+/5 4+/5 5/5 5/5    ?Hip internal rotation        ?Hip external rotation        ?Knee flexion 4+/5 4+/5 4+/5 4+/5 5/5 5/5  ?Knee extension 5/5 5/5 5/5 5/5     ?Ankle dorsiflexion 4/5 4/5 4/5 4/5    ?Ankle plantarflexion 3/5 3/5 3/5 4-/5    ? (Blank rows = not tested) ? ? ?FUNCTIONAL TESTS:  ?Eval: ?TUG: 21s ?STS transfers: bilat UE require- one for push off and one for balance ?Standing balance: pt unable to perform 4 stage balance or marching without UE assistance ? ?3/28 ? ?TUG: 17.2 ?STS transfers: 5XSTS 17.7s (no UE needed) ?Standing balance: pt unable to perform 4 stage balance or marching without UE assistance ? ? ?GAIT: ?Distance walked: 6MWT 661f = 845 ?Assistive device utilized: WEnvironmental consultant- 4 wheeled ?Level of assistance: Modified independence ?Comments: increased fwd trunk lean and UE pressure through walker, able to complete 3 full laps without signficant fatigue, no crossover stepping noted  ? ? ? ?TODAY'S TREATMENT  ?02/02/22 ? ?Pt seen for  aquatic therapy today.  Treatment took place in water 3.25-4.8 ft in depth at the [employer] pool. Temp of water was 92?.  Pt entered/exited the pool via stairs step to pattern independently with bilat rail.  ? ? ?Walking using yellow noodle all depths Fwd, back and side stepping. SBA.   ? ?Seated on bench in water:  ?  Flutter kicking 2x20, add/abd 2x20  (aerobic capacity and strengthening) ?  Sit to/from Stand with HHA -> SBA x 10, cues for forward wt shift ? Seated on 4th step in water:  ?Flutter kicking 2x20, add/abd 2x20 ?Sit to/from stand with HHA x 10 . Cues for weight shifting forward and immediate standing balance. ?Standing at steps:   ? Foot taps to first step x 8 each leg, unable to complete with unilateral UE support on rail ? Step ups with unilateral support on rail x 8 each LE ?Standing holding wall:  Heel / toe raises x 10 ?Holding yellow noodle with SBA/ CGA: ?Heel / toe raises x 10 ?Toe taps/ weight shifts - front, side, back and return to neutral x 5 reps each direction ?  ?Pt requires buoyancy for support and to offload joints with strengthening exercises. Viscosity of the water is needed for  resistance of strengthening; water current perturbations provides challenge to standing balance unsupported, requiring increased core activation. ? ? ? ?PATIENT EDUCATION:  ?Education details: exam findings, exercise progression,

## 2022-02-09 ENCOUNTER — Ambulatory Visit (HOSPITAL_BASED_OUTPATIENT_CLINIC_OR_DEPARTMENT_OTHER): Payer: PPO | Admitting: Physical Therapy

## 2022-02-09 DIAGNOSIS — R262 Difficulty in walking, not elsewhere classified: Secondary | ICD-10-CM

## 2022-02-09 DIAGNOSIS — M6281 Muscle weakness (generalized): Secondary | ICD-10-CM

## 2022-02-09 DIAGNOSIS — M545 Low back pain, unspecified: Secondary | ICD-10-CM | POA: Diagnosis not present

## 2022-02-09 DIAGNOSIS — M5451 Vertebrogenic low back pain: Secondary | ICD-10-CM

## 2022-02-09 NOTE — Therapy (Signed)
?OUTPATIENT PHYSICAL THERAPY TREATMENT NOTE ? ?Recertification ?  ? ? ?Patient Name: Kirk [name] ?MRN: [id number] ?DOB:1941/07/09, 81 y.o., male ?Today's Date: 02/10/2022 ? ?PCP: Kirk Barrack, MD ?REFERRING PROVIDER: [name], MD ? ? PT End of Session - 02/09/22 1457   ? ? Visit Number 15   ? Number of Visits 23   ? Date for PT Re-Evaluation 03/09/22   ? Authorization Type HTA   ? Progress Note Due on Visit 25   ? PT Start Time 1550   ? PT Stop Time 1628   ? PT Time Calculation (min) 38 min   ? Activity Tolerance Patient tolerated treatment well   ? Behavior During Therapy [facility] for tasks assessed/performed   ? ?  ?  ? ?  ? ? ? ? ? ? ? ?Past Medical History:  ?Diagnosis Date  ? Cataract   ? Congenital glaucoma   ? "both eyes operated on when I was 106 months old"  ? Cough   ? Disturbance of skin sensation   ? Diverticulosis of colon 2014  ? Diverticulum of esophagus, acquired   ? Dysphagia, unspecified(787.20)   ? Encounter for long-term (current) use of other medications   ? Herpes zoster without mention of complication   ? Hyperglycemia   ? patient denies  ? Hyperlipidemia   ? Hypertension   ? Hypertrophy of prostate without urinary obstruction and other lower urinary tract symptoms (LUTS)   ? Lumbago   ? Nevus, non-neoplastic   ? Osteoarthrosis, unspecified whether generalized or localized, unspecified site   ? pt. denies  ? Other premature beats   ? Pain in limb   ? Plantar fascial fibromatosis   ? Special screening for malignant neoplasm of prostate   ? Unspecified glaucoma(365.9)   ? Unspecified pruritic disorder   ? Unspecified vitamin D deficiency   ? Urinary frequency   ? Zenker's diverticulum   ? ?Past Surgical History:  ?Procedure Laterality Date  ? CATARACT EXTRACTION BILATERAL W/ ANTERIOR VITRECTOMY Bilateral 1977  ? COLONOSCOPY  09/06/2013  ? Kirk [name]  ? DIRECT LARYNGOSCOPY  10/22/2015  ? Cervical esophagoscopy.  Endoscopic esophageal diverticulotomy.   ? [place]  ? GLAUCOMA  SURGERY  1945  ? congenital glaucoma  ? HERNIA REPAIR  1993  ? [place] REPAIR  2008  ? LARYNGOSCOPY    ? with stapling of zenkers diverticulum  ? LUMBAR LAMINECTOMY/ DECOMPRESSION WITH MET-RX Left 08/16/2019  ? Procedure: Left Lumbar Two-Three Minimally Invasive Laminectomy and Microdiscectomy;  Surgeon: Kirk Part, MD;  Location: [place];  Service: Neurosurgery;  Laterality: Left;  Left Lumbar 2-3 Minimally invasive laminectomy and microdiscectomy  ? ?Patient Active Problem List  ? Diagnosis Date Noted  ? Central stenosis of spinal canal 05/26/2021  ? Actinic keratosis 06/16/2020  ? Bilateral leg weakness 06/16/2020  ? Onychomycosis 12/13/2019  ? Herniated lumbar intervertebral disc 08/16/2019  ? Degenerative lumbar spinal stenosis 02/14/2019  ? TSH elevation 02/25/2016  ? Orthostatic hypotension 02/17/2016  ? Syncope 01/07/2016  ? Esophageal diverticulum, acquired 10/22/2015  ? Xeroderma 08/20/2015  ? Bifascicular block 08/20/2015  ? Neuropathy ([place]) 02/05/2015  ? Diverticulosis of colon   ? Vitamin D deficiency   ? Hyperglycemia   ? Dysphagia   ? Diverticulum of esophagus, acquired   ? Hyperlipidemia   ? Glaucoma   ? BPH (benign prostatic hyperplasia)   ? Essential hypertension 02/01/2013  ? 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  ? ? ?REFERRING DIAG: R29.898 (ICD-10-CM) -  Bilateral leg weakness ? ?THERAPY DIAG:  ?Muscle weakness (generalized) - Plan: PT plan of care cert/re-cert ? ?Difficulty walking - Plan: PT plan of care cert/re-cert ? ?Vertebrogenic low back pain - Plan: PT plan of care cert/re-cert ? ?PERTINENT HISTORY: Congenital glaucoma, Nov 2020 disc herniation surgery, vision deficits- lack of depth perception, and peripheral vision ? ?PRECAUTIONS: none ? ?SUBJECTIVE: "Nothing is hurting, feel pretty good". Wife states pt does not move much around the house when they are home but he tends to move more when it is warm out.    ? ?PAIN:  ?Are you having pain? no ?0/10 ? ? ?PRECAUTIONS:  None ? ?WEIGHT BEARING RESTRICTIONS No ? ?FALLS:  ?Has patient fallen in last 6 months? No, Number of falls: 0 ? ?LIVING ENVIRONMENT: ?Lives with: lives with their family and lives with their spouse ?Lives in: House/apartment ?Stairs: Yes; 5- cane on steps, rail on the L  ?Has following equipment at home: Single point cane and Walker - 4 wheeled ? ?OCCUPATION: retired  ? ?PLOF: Independent with household mobility with device ? ?PATIENT GOALS : improve LE weakness. "Walk one day without the walker" ? ? ?OBJECTIVE:  ? ?DIAGNOSTIC FINDINGS:  ?Lumbar spine: ?  ?1. Persistent prominent left subarticular zone inferiorly migrated ?extrusion at L2-L3 with severe spinal canal stenosis and impingement ?of the cauda equina nerve roots, particularly on the left. The ?extrusion is slightly smaller compared to the study from 2020. ?2. Left subarticular zone protrusion at L1-L2 with contact and ?possible impingement of the traversing left L2 nerve root, similar ?to the prior study. Otherwise, no high-grade spinal canal or neural ?foraminal stenosis in the lumbar spine. ?3. Multilevel facet arthropathy, most advanced at L4-L5, with ?bilateral facet joint effusions and perifacetal soft tissue edema at ?L2-L3 through L4-L5. ?4. Degenerative marrow signal abnormality at L1 through L3, ?progressed since 2020. ? ?PATIENT SURVEYS:  ?FOTO N/A ? ?SENSATION: ? Light touch: Deficits  ?  ? ?LUMBARAROM/PROM: Able to sit without UE support, more upright posture ? ?A/PROM A/PROM  ?11/09/2021 3/28 5/9  ?Flexion 50% 50% 50%  ?Extension 0% 0 25%  ?Right lateral flexion 25% 25% 50%  ?Left lateral flexion 25% 25% 50%  ?Right rotation 50% 50% 70%  ?Left rotation 50% 50% 70%  ? (Blank rows = not tested) ? ?LE AROM/PROM: moderate limitations into hip flexion, ER, and IR; bilat knees WFL- lacking full TKE but symmetrical bilaterally ? ?LE MMT: ? ?MMT Right ?11/09/2021 Left ?11/09/2021 R 3/28 L 3/28 R ?02/02/22 L ?02/02/22  ?Hip flexion 4+/5 4+/5 4+/5 4+/5 5/5 5/5   ?Hip extension        ?Hip abduction 4+/5 4+/5 4+/5 4+/5    ?Hip adduction 4+/5 4+/5 5/5 5/5    ?Hip internal rotation        ?Hip external rotation        ?Knee flexion 4+/5 4+/5 4+/5 4+/5 5/5 5/5  ?Knee extension 5/5 5/5 5/5 5/5    ?Ankle dorsiflexion 4/5 4/5 4/5 4/5    ?Ankle plantarflexion 3/5 3/5 3/5 4-/5    ? (Blank rows = not tested) ? ? ?FUNCTIONAL TESTS:  ?Eval: ?TUG: 21s ?STS transfers: bilat UE require- one for push off and one for balance ?Standing balance: pt unable to perform 4 stage balance or marching without UE assistance ? ?3/28 ? ?TUG: 17.2 ?STS transfers: 5XSTS 17.7s (no UE needed) ?Standing balance: pt unable to perform 4 stage balance or marching without UE assistance ? ?02/09/22 ?TUG:18.5s ?5 x STS: 18s  from armed chair 1 ue support ?Standing balance:pt unable to perform stage 4 balance or marching without ue assistance ? ? ?GAIT: ?Distance walked: 6MWT 697f.  12/29/21: 845 ft  02/09/22:870 ft ?Assistive device utilized: WEnvironmental consultant- 4 wheeled ?Level of assistance: Modified independence ?Comments: increased fwd trunk lean and UE pressure through walker, able to complete 3 full laps without signficant fatigue, no crossover stepping noted  ? ? ? ?TODAY'S TREATMENT  ?02/09/22 ? ?Re-assessment completed ?Objective measures taken: 6MWT; TUG; 5XSTS ? ?There ex: ?Balance: Standing ?Wide BOS with vision: hold x 20 s after several tries ?Feet together with vision: best hold 10s (several tries) ?Hip hinges (initiating) x5 with tactile and VC for weight shifting. Encouraging safe reaching. ?Rotation R/L unsupported with tactile cues. X4 ea direction ? ?  ? ? ? ? ?PATIENT EDUCATION:  ?Education details: exam findings, exercise progression, muscle firing,  envelope of function, HEP, POC ? ?Person educated: Patient ?Education method: Explanation, Demonstration, Tactile cues, Verbal cues, and Handouts ?Education comprehension: verbalized understanding, returned demonstration, verbal cues required, and tactile cues  required ? ? ?HOME EXERCISE PROGRAM: ?Utilized previous HEP printouts ? ?ASSESSMENT: ? ?CLINICAL IMPRESSION: ?Pt seen on land for re-assessment and balance training with intent to assess ability to use cane safely. With

## 2022-02-11 ENCOUNTER — Ambulatory Visit (HOSPITAL_BASED_OUTPATIENT_CLINIC_OR_DEPARTMENT_OTHER): Payer: PPO | Admitting: Physical Therapy

## 2022-02-11 ENCOUNTER — Encounter (HOSPITAL_BASED_OUTPATIENT_CLINIC_OR_DEPARTMENT_OTHER): Payer: Self-pay | Admitting: Physical Therapy

## 2022-02-11 DIAGNOSIS — R262 Difficulty in walking, not elsewhere classified: Secondary | ICD-10-CM

## 2022-02-11 DIAGNOSIS — M6281 Muscle weakness (generalized): Secondary | ICD-10-CM

## 2022-02-11 DIAGNOSIS — M545 Low back pain, unspecified: Secondary | ICD-10-CM | POA: Diagnosis not present

## 2022-02-11 DIAGNOSIS — M5451 Vertebrogenic low back pain: Secondary | ICD-10-CM

## 2022-02-11 NOTE — Therapy (Addendum)
?OUTPATIENT PHYSICAL THERAPY TREATMENT NOTE ? ? ?  ? ? ?Patient Name: Kirk [name] ?MRN: [id number] ?DOB:08-10-1941, 81 y.o., male ?Today's Date: 02/11/2022 ? ?PCP: [name], MD ?REFERRING PROVIDER: [name], MD ? ? PT End of Session - 02/11/22 1624   ? ? Visit Number 16   ? Number of Visits 23   ? Date for PT Re-Evaluation 03/09/22   ? Authorization Type HTA   ? PT Start Time 1616   ? PT Stop Time 1700   ? PT Time Calculation (min) 44 min   ? ?  ?  ? ?  ? ? ? ? ? ? ? ?Past Medical History:  ?Diagnosis Date  ? Cataract   ? Congenital glaucoma   ? "both eyes operated on when I was 68 months old"  ? Cough   ? Disturbance of skin sensation   ? Diverticulosis of colon 2014  ? Diverticulum of esophagus, acquired   ? Dysphagia, unspecified(787.20)   ? Encounter for long-term (current) use of other medications   ? Herpes zoster without mention of complication   ? Hyperglycemia   ? patient denies  ? Hyperlipidemia   ? Hypertension   ? Hypertrophy of prostate without urinary obstruction and other lower urinary tract symptoms (LUTS)   ? Lumbago   ? Nevus, non-neoplastic   ? Osteoarthrosis, unspecified whether generalized or localized, unspecified site   ? pt. denies  ? Other premature beats   ? Pain in limb   ? Plantar fascial fibromatosis   ? Special screening for malignant neoplasm of prostate   ? Unspecified glaucoma(365.9)   ? Unspecified pruritic disorder   ? Unspecified vitamin D deficiency   ? Urinary frequency   ? Zenker's diverticulum   ? ?Past Surgical History:  ?Procedure Laterality Date  ? CATARACT EXTRACTION BILATERAL W/ ANTERIOR VITRECTOMY Bilateral 1977  ? COLONOSCOPY  09/06/2013  ? [name]  ? DIRECT LARYNGOSCOPY  10/22/2015  ? Cervical esophagoscopy.  Endoscopic esophageal diverticulotomy.   ? [employer]  ? GLAUCOMA SURGERY  1945  ? congenital glaucoma  ? HERNIA REPAIR  1993  ? [place] REPAIR  2008  ? LARYNGOSCOPY    ? with stapling of zenkers diverticulum  ? LUMBAR  LAMINECTOMY/ DECOMPRESSION WITH MET-RX Left 08/16/2019  ? Procedure: Left Lumbar Two-Three Minimally Invasive Laminectomy and Microdiscectomy;  Surgeon: Judith Part, MD;  Location: [facility];  Service: Neurosurgery;  Laterality: Left;  Left Lumbar 2-3 Minimally invasive laminectomy and microdiscectomy  ? ?Patient Active Problem List  ? Diagnosis Date Noted  ? Central stenosis of spinal canal 05/26/2021  ? Actinic keratosis 06/16/2020  ? Bilateral leg weakness 06/16/2020  ? Onychomycosis 12/13/2019  ? Herniated lumbar intervertebral disc 08/16/2019  ? Degenerative lumbar spinal stenosis 02/14/2019  ? TSH elevation 02/25/2016  ? Orthostatic hypotension 02/17/2016  ? Syncope 01/07/2016  ? Esophageal diverticulum, acquired 10/22/2015  ? Xeroderma 08/20/2015  ? Bifascicular block 08/20/2015  ? Neuropathy ([place]) 02/05/2015  ? Diverticulosis of colon   ? Vitamin D deficiency   ? Hyperglycemia   ? Dysphagia   ? Diverticulum of esophagus, acquired   ? Hyperlipidemia   ? Glaucoma   ? BPH (benign prostatic hyperplasia)   ? Essential hypertension 02/01/2013  ? 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  ? ? ?REFERRING DIAG: R29.898 (ICD-10-CM) - Bilateral leg weakness ? ?THERAPY DIAG:  ?Muscle weakness (generalized) ? ?Difficulty walking ? ?Vertebrogenic low back pain ? ?PERTINENT HISTORY: Congenital glaucoma, Nov 2020 disc herniation  surgery, vision deficits- lack of depth perception, and peripheral vision ? ?PRECAUTIONS: none ? ?SUBJECTIVE: "Nothing is hurting, feel pretty good". Wife states pt does not move much around the house when they are home but he tends to move more when it is warm out.    ? ?PAIN:  ?Are you having pain? no ?0/10 ? ? ?PRECAUTIONS: None ? ?WEIGHT BEARING RESTRICTIONS No ? ?FALLS:  ?Has patient fallen in last 6 months? No, Number of falls: 0 ? ?LIVING ENVIRONMENT: ?Lives with: lives with their family and lives with their spouse ?Lives in: House/apartment ?Stairs: Yes; 5- cane on steps, rail on the L  ?Has following  equipment at home: Single point cane and Walker - 4 wheeled ? ?OCCUPATION: retired  ? ?PLOF: Independent with household mobility with device ? ?PATIENT GOALS : improve LE weakness. "Walk one day without the walker" ? ? ?OBJECTIVE:  ? ?DIAGNOSTIC FINDINGS:  ?Lumbar spine: ?  ?1. Persistent prominent left subarticular zone inferiorly migrated ?extrusion at L2-L3 with severe spinal canal stenosis and impingement ?of the cauda equina nerve roots, particularly on the left. The ?extrusion is slightly smaller compared to the study from 2020. ?2. Left subarticular zone protrusion at L1-L2 with contact and ?possible impingement of the traversing left L2 nerve root, similar ?to the prior study. Otherwise, no high-grade spinal canal or neural ?foraminal stenosis in the lumbar spine. ?3. Multilevel facet arthropathy, most advanced at L4-L5, with ?bilateral facet joint effusions and perifacetal soft tissue edema at ?L2-L3 through L4-L5. ?4. Degenerative marrow signal abnormality at L1 through L3, ?progressed since 2020. ? ?PATIENT SURVEYS:  ?FOTO N/A ? ?SENSATION: ? Light touch: Deficits  ?  ? ?LUMBARAROM/PROM: Able to sit without UE support, more upright posture ? ?A/PROM A/PROM  ?11/09/2021 3/28 5/9  ?Flexion 50% 50% 50%  ?Extension 0% 0 25%  ?Right lateral flexion 25% 25% 50%  ?Left lateral flexion 25% 25% 50%  ?Right rotation 50% 50% 70%  ?Left rotation 50% 50% 70%  ? (Blank rows = not tested) ? ?LE AROM/PROM: moderate limitations into hip flexion, ER, and IR; bilat knees WFL- lacking full TKE but symmetrical bilaterally ? ?LE MMT: ? ?MMT Right ?11/09/2021 Left ?11/09/2021 R 3/28 L 3/28 R ?02/02/22 L ?02/02/22  ?Hip flexion 4+/5 4+/5 4+/5 4+/5 5/5 5/5  ?Hip extension        ?Hip abduction 4+/5 4+/5 4+/5 4+/5    ?Hip adduction 4+/5 4+/5 5/5 5/5    ?Hip internal rotation        ?Hip external rotation        ?Knee flexion 4+/5 4+/5 4+/5 4+/5 5/5 5/5  ?Knee extension 5/5 5/5 5/5 5/5    ?Ankle dorsiflexion 4/5 4/5 4/5 4/5    ?Ankle  plantarflexion 3/5 3/5 3/5 4-/5    ? (Blank rows = not tested) ? ? ?FUNCTIONAL TESTS:  ?Eval: ?TUG: 21s ?STS transfers: bilat UE require- one for push off and one for balance ?Standing balance: pt unable to perform 4 stage balance or marching without UE assistance ? ?3/28 ? ?TUG: 17.2 ?STS transfers: 5XSTS 17.7s (no UE needed) ?Standing balance: pt unable to perform 4 stage balance or marching without UE assistance ? ?02/09/22 ?TUG:18.5s ?5 x STS: 18s from armed chair 1 ue support ?Standing balance:pt unable to perform stage 4 balance or marching without ue assistance ? ? ?GAIT: ?Distance walked: 6MWT 688f.  12/29/21: 845 ft  02/09/22:870 ft ?Assistive device utilized: WEnvironmental consultant- 4 wheeled ?Level of assistance: Modified independence ?Comments: increased fwd  trunk lean and UE pressure through walker, able to complete 3 full laps without signficant fatigue, no crossover stepping noted  ? ? ? ?TODAY'S TREATMENT  ?02/11/22 ?Pt seen for aquatic therapy today.  Treatment took place in water 3.25-4.8 ft in depth at the [facility] pool. Temp of water was 91?.  Pt entered/exited the pool via stairs (step through pattern) independently with bilat rail. ? ?Walking in 4.3 ft forward and back multiple widths. Cues for heel strike and toe off ?Standing ue support on wall: df,;pf; marching; add/abd; hip ext x10 ea R/L ?Standing step taps r/L x10 with unilateral ue support.  ?-step ups R/L x10 ea ?Seated: flutter kicking; add/abd 3x20 reps ? ?Balance straddling noodle with yellow hand buoys ?-cycling ?- attempted jumps with add/abd (pt unable to coordinate) ? ?STS from bench onto step 2x5 reps. Requires ue support of yellow noodle. Cues for weight shifting ? ? ? ?Pt requires buoyancy for support and to offload joints with strengthening exercises. Viscosity of the water is needed for resistance of strengthening; water current perturbations provides challenge to standing balance unsupported, requiring increased core  activation. ? ? ? ? ? ? ?PATIENT EDUCATION:  ?Education details: exam findings, exercise progression, muscle firing,  envelope of function, HEP, POC ? ?Person educated: Patient ?Education method: Explanation, Demonstration, Tac

## 2022-02-16 ENCOUNTER — Encounter (HOSPITAL_BASED_OUTPATIENT_CLINIC_OR_DEPARTMENT_OTHER): Payer: Self-pay | Admitting: Physical Therapy

## 2022-02-16 ENCOUNTER — Ambulatory Visit (HOSPITAL_BASED_OUTPATIENT_CLINIC_OR_DEPARTMENT_OTHER): Payer: PPO | Admitting: Physical Therapy

## 2022-02-16 DIAGNOSIS — M5451 Vertebrogenic low back pain: Secondary | ICD-10-CM

## 2022-02-16 DIAGNOSIS — M6281 Muscle weakness (generalized): Secondary | ICD-10-CM

## 2022-02-16 DIAGNOSIS — M545 Low back pain, unspecified: Secondary | ICD-10-CM | POA: Diagnosis not present

## 2022-02-16 DIAGNOSIS — R262 Difficulty in walking, not elsewhere classified: Secondary | ICD-10-CM

## 2022-02-16 NOTE — Therapy (Signed)
?OUTPATIENT PHYSICAL THERAPY TREATMENT NOTE ? ? ?  ? ? ?Patient Name: Kirk [name] ?MRN: [id number] ?DOB:[birth year], 81 y.o., male ?Today's Date: 02/16/2022 ? ?PCP: [name], MD ?REFERRING PROVIDER: [name], MD ? ? PT End of Session - 02/16/22 1505   ? ? Visit Number 17   ? Number of Visits 23   ? Date for PT Re-Evaluation 03/09/22   ? Authorization Type HTA   ? PT Start Time 8841   ? PT Stop Time 1530   ? PT Time Calculation (min) 44 min   ? Activity Tolerance Patient tolerated treatment well   ? Behavior During Therapy [facility] for tasks assessed/performed   ? ?  ?  ? ?  ? ? ? ? ? ? ? ?Past Medical History:  ?Diagnosis Date  ? Cataract   ? Congenital glaucoma   ? "both eyes operated on when I was 29 months old"  ? Cough   ? Disturbance of skin sensation   ? Diverticulosis of colon 2014  ? Diverticulum of esophagus, acquired   ? Dysphagia, unspecified(787.20)   ? Encounter for long-term (current) use of other medications   ? Herpes zoster without mention of complication   ? Hyperglycemia   ? patient denies  ? Hyperlipidemia   ? Hypertension   ? Hypertrophy of prostate without urinary obstruction and other lower urinary tract symptoms (LUTS)   ? Lumbago   ? Nevus, non-neoplastic   ? Osteoarthrosis, unspecified whether generalized or localized, unspecified site   ? pt. denies  ? Other premature beats   ? Pain in limb   ? Plantar fascial fibromatosis   ? Special screening for malignant neoplasm of prostate   ? Unspecified glaucoma(365.9)   ? Unspecified pruritic disorder   ? Unspecified vitamin D deficiency   ? Urinary frequency   ? Zenker's diverticulum   ? ?Past Surgical History:  ?Procedure Laterality Date  ? CATARACT EXTRACTION BILATERAL W/ ANTERIOR VITRECTOMY Bilateral 1977  ? COLONOSCOPY  09/06/2013  ? [name]  ? DIRECT LARYNGOSCOPY  10/22/2015  ? Cervical esophagoscopy.  Endoscopic esophageal diverticulotomy.   ? [place]  ? GLAUCOMA SURGERY  1945  ? congenital glaucoma  ? HERNIA REPAIR   1993  ? [place] REPAIR  2008  ? LARYNGOSCOPY    ? with stapling of zenkers diverticulum  ? LUMBAR LAMINECTOMY/ DECOMPRESSION WITH MET-RX Left 08/16/2019  ? Procedure: Left Lumbar Two-Three Minimally Invasive Laminectomy and Microdiscectomy;  Surgeon: Judith Part, MD;  Location: [facility];  Service: Neurosurgery;  Laterality: Left;  Left Lumbar 2-3 Minimally invasive laminectomy and microdiscectomy  ? ?Patient Active Problem List  ? Diagnosis Date Noted  ? Central stenosis of spinal canal 05/26/2021  ? Actinic keratosis 06/16/2020  ? Bilateral leg weakness 06/16/2020  ? Onychomycosis 12/13/2019  ? Herniated lumbar intervertebral disc 08/16/2019  ? Degenerative lumbar spinal stenosis 02/14/2019  ? TSH elevation 02/25/2016  ? Orthostatic hypotension 02/17/2016  ? Syncope 01/07/2016  ? Esophageal diverticulum, acquired 10/22/2015  ? Xeroderma 08/20/2015  ? Bifascicular block 08/20/2015  ? Neuropathy ([place]) 02/05/2015  ? Diverticulosis of colon   ? Vitamin D deficiency   ? Hyperglycemia   ? Dysphagia   ? Diverticulum of esophagus, acquired   ? Hyperlipidemia   ? Glaucoma   ? BPH (benign prostatic hyperplasia)   ? Essential hypertension 02/01/2013  ? 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  ? ? ?REFERRING DIAG: R29.898 (ICD-10-CM) - Bilateral leg weakness ? ?THERAPY DIAG:  ?Muscle  weakness (generalized) ? ?Difficulty walking ? ?Vertebrogenic low back pain ? ?PERTINENT HISTORY: Congenital glaucoma, Nov 2020 disc herniation surgery, vision deficits- lack of depth perception, and peripheral vision ? ?PRECAUTIONS: none ? ?SUBJECTIVE: No questions or comments.  "Feeling pretty good, no pain."  ? ?PAIN:  ?Are you having pain? no ?0/10 ? ? ?PRECAUTIONS: None ? ?WEIGHT BEARING RESTRICTIONS No ? ?FALLS:  ?Has patient fallen in last 6 months? No, Number of falls: 0 ? ?LIVING ENVIRONMENT: ?Lives with: lives with their family and lives with their spouse ?Lives in: House/apartment ?Stairs: Yes; 5- cane on steps, rail on the L  ?Has  following equipment at home: Single point cane and Walker - 4 wheeled ? ?OCCUPATION: retired  ? ?PLOF: Independent with household mobility with device ? ?PATIENT GOALS : improve LE weakness. "Walk one day without the walker" ? ? ?OBJECTIVE:  ? ?DIAGNOSTIC FINDINGS:  ?Lumbar spine: ?  ?1. Persistent prominent left subarticular zone inferiorly migrated ?extrusion at L2-L3 with severe spinal canal stenosis and impingement ?of the cauda equina nerve roots, particularly on the left. The ?extrusion is slightly smaller compared to the study from 2020. ?2. Left subarticular zone protrusion at L1-L2 with contact and ?possible impingement of the traversing left L2 nerve root, similar ?to the prior study. Otherwise, no high-grade spinal canal or neural ?foraminal stenosis in the lumbar spine. ?3. Multilevel facet arthropathy, most advanced at L4-L5, with ?bilateral facet joint effusions and perifacetal soft tissue edema at ?L2-L3 through L4-L5. ?4. Degenerative marrow signal abnormality at L1 through L3, ?progressed since 2020. ? ?PATIENT SURVEYS:  ?FOTO N/A ? ?SENSATION: ? Light touch: Deficits  ?  ? ?LUMBARAROM/PROM: Able to sit without UE support, more upright posture ? ?A/PROM A/PROM  ?11/09/2021 3/28 5/9  ?Flexion 50% 50% 50%  ?Extension 0% 0 25%  ?Right lateral flexion 25% 25% 50%  ?Left lateral flexion 25% 25% 50%  ?Right rotation 50% 50% 70%  ?Left rotation 50% 50% 70%  ? (Blank rows = not tested) ? ?LE AROM/PROM: moderate limitations into hip flexion, ER, and IR; bilat knees WFL- lacking full TKE but symmetrical bilaterally ? ?LE MMT: ? ?MMT Right ?11/09/2021 Left ?11/09/2021 R 3/28 L 3/28 R ?02/02/22 L ?02/02/22  ?Hip flexion 4+/5 4+/5 4+/5 4+/5 5/5 5/5  ?Hip extension        ?Hip abduction 4+/5 4+/5 4+/5 4+/5    ?Hip adduction 4+/5 4+/5 5/5 5/5    ?Hip internal rotation        ?Hip external rotation        ?Knee flexion 4+/5 4+/5 4+/5 4+/5 5/5 5/5  ?Knee extension 5/5 5/5 5/5 5/5    ?Ankle dorsiflexion 4/5 4/5 4/5 4/5     ?Ankle plantarflexion 3/5 3/5 3/5 4-/5    ? (Blank rows = not tested) ? ? ?FUNCTIONAL TESTS:  ?Eval: ?TUG: 21s ?STS transfers: bilat UE require- one for push off and one for balance ?Standing balance: pt unable to perform 4 stage balance or marching without UE assistance ? ?3/28 ? ?TUG: 17.2 ?STS transfers: 5XSTS 17.7s (no UE needed) ?Standing balance: pt unable to perform 4 stage balance or marching without UE assistance ? ?02/09/22 ?TUG:18.5s ?5 x STS: 18s from armed chair 1 ue support ?Standing balance:pt unable to perform stage 4 balance or marching without ue assistance ? ? ?GAIT: ?Distance walked: 6MWT 664f.  12/29/21: 845 ft  02/09/22:870 ft ?Assistive device utilized: WEnvironmental consultant- 4 wheeled ?Level of assistance: Modified independence ?Comments: increased fwd trunk lean and UE  pressure through walker, able to complete 3 full laps without signficant fatigue, no crossover stepping noted  ? ? ? ?TODAY'S TREATMENT  ?02/11/22 ?Pt seen for aquatic therapy today.  Treatment took place in water 3.25-4.8 ft in depth at the [facility] pool. Temp of water was 91?.  Pt entered/exited the pool via stairs (step through pattern) independently with bilat rail. ? ?Walking in 4.3 ft forward and back multiple widths. Cues for heel strike and toe off ? ?Seated: flutter kicking; add/abd 3x20 reps  ?Isometric add sets squeezing buoyany ball x10 10s hold ?Lumbar stretching in seated 4 x 20 s hold; right then left stretch 3 x 20s ? ?STS from bench onto step 2x5 reps. Requires ue support of yellow noodle. Cues for weight shifting and coming to full standing, not leaning against bench ? ?Standing step taps r/L 2x10 with unilateral ue support. X10 with ue supported by yellow noodel (very difficult) ? ?Balance straddling noodle with yellow hand buoys ?-cycling x2 lengths ?-squats x 15 ? ?  ?Pt requires buoyancy for support and to offload joints with strengthening exercises. Viscosity of the water is needed for resistance of  strengthening; water current perturbations provides challenge to standing balance unsupported, requiring increased core activation. ? ? ? ? ? ? ?PATIENT EDUCATION:  ?Education details: exam findings, exercise progr

## 2022-02-18 ENCOUNTER — Ambulatory Visit (HOSPITAL_BASED_OUTPATIENT_CLINIC_OR_DEPARTMENT_OTHER): Payer: PPO | Admitting: Physical Therapy

## 2022-02-18 ENCOUNTER — Encounter (HOSPITAL_BASED_OUTPATIENT_CLINIC_OR_DEPARTMENT_OTHER): Payer: Self-pay | Admitting: Physical Therapy

## 2022-02-18 DIAGNOSIS — R262 Difficulty in walking, not elsewhere classified: Secondary | ICD-10-CM

## 2022-02-18 DIAGNOSIS — M6281 Muscle weakness (generalized): Secondary | ICD-10-CM

## 2022-02-18 DIAGNOSIS — M545 Low back pain, unspecified: Secondary | ICD-10-CM | POA: Diagnosis not present

## 2022-02-18 DIAGNOSIS — M5451 Vertebrogenic low back pain: Secondary | ICD-10-CM

## 2022-02-18 NOTE — Therapy (Signed)
OUTPATIENT PHYSICAL THERAPY TREATMENT NOTE       Patient Name: BRYNDAN BILYK MRN: [id number] DOB:06-28-1941, 81 y.o., male Today's Date: 02/18/2022  PCP: [name], MD REFERRING PROVIDER: [name], MD   PT End of Session - 02/18/22 1906     Visit Number 18    Number of Visits 23    Date for PT Re-Evaluation 03/09/22    Authorization Type HTA    PT Start Time 1616    PT Stop Time 1700    PT Time Calculation (min) 44 min                  Past Medical History:  Diagnosis Date   Cataract    Congenital glaucoma    "both eyes operated on when I was 68 months old"   Cough    Disturbance of skin sensation    Diverticulosis of colon 2014   Diverticulum of esophagus, acquired    Dysphagia, unspecified(787.20)    Encounter for long-term (current) use of other medications    Herpes zoster without mention of complication    Hyperglycemia    patient denies   Hyperlipidemia    Hypertension    Hypertrophy of prostate without urinary obstruction and other lower urinary tract symptoms (LUTS)    Lumbago    Nevus, non-neoplastic    Osteoarthrosis, unspecified whether generalized or localized, unspecified site    pt. denies   Other premature beats    Pain in limb    Plantar fascial fibromatosis    Special screening for malignant neoplasm of prostate    Unspecified glaucoma(365.9)    Unspecified pruritic disorder    Unspecified vitamin D deficiency    Urinary frequency    Zenker's diverticulum    Past Surgical History:  Procedure Laterality Date   CATARACT EXTRACTION BILATERAL W/ ANTERIOR VITRECTOMY Bilateral 1977   COLONOSCOPY  09/06/2013   [name]   DIRECT LARYNGOSCOPY  10/22/2015   Cervical esophagoscopy.  Endoscopic esophageal diverticulotomy.    [place]   congenital glaucoma   HERNIA REPAIR  1993   INCISIONAL HERNIA REPAIR  2008   LARYNGOSCOPY     with stapling of zenkers diverticulum   LUMBAR  LAMINECTOMY/ DECOMPRESSION WITH MET-RX Left 08/16/2019   Procedure: Left Lumbar Two-Three Minimally Invasive Laminectomy and Microdiscectomy;  Surgeon: Judith Part, MD;  Location: [facility];  Service: Neurosurgery;  Laterality: Left;  Left Lumbar 2-3 Minimally invasive laminectomy and microdiscectomy   Patient Active Problem List   Diagnosis Date Noted   Central stenosis of spinal canal 05/26/2021   Actinic keratosis 06/16/2020   Bilateral leg weakness 06/16/2020   Onychomycosis 12/13/2019   Herniated lumbar intervertebral disc 08/16/2019   Degenerative lumbar spinal stenosis 02/14/2019   TSH elevation 02/25/2016   Orthostatic hypotension 02/17/2016   Syncope 01/07/2016   Esophageal diverticulum, acquired 10/22/2015   Xeroderma 08/20/2015   Bifascicular block 08/20/2015   Neuropathy ([place]) 02/05/2015   Diverticulosis of colon    Vitamin D deficiency    Hyperglycemia    Dysphagia    Diverticulum of esophagus, acquired    Hyperlipidemia    Glaucoma    BPH (benign prostatic hyperplasia)    Essential hypertension 02/01/2013   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    REFERRING DIAG: R29.898 (ICD-10-CM) - Bilateral leg weakness  THERAPY DIAG:  Muscle weakness (generalized)  Difficulty walking  Vertebrogenic low back pain  PERTINENT HISTORY: Congenital glaucoma, Nov 2020 disc herniation  surgery, vision deficits- lack of depth perception, and peripheral vision  PRECAUTIONS: none  SUBJECTIVE: "feel good"   PAIN:  Are you having pain? no 0/10   PRECAUTIONS: None  WEIGHT BEARING RESTRICTIONS No  FALLS:  Has patient fallen in last 6 months? No, Number of falls: 0  LIVING ENVIRONMENT: Lives with: lives with their family and lives with their spouse Lives in: House/apartment Stairs: Yes; 5- cane on steps, rail on the L  Has following equipment at home: Single point cane and Walker - 4 wheeled  OCCUPATION: retired   PLOF: Independent with household mobility with  device  PATIENT GOALS : improve LE weakness. "Walk one day without the walker"   OBJECTIVE:   DIAGNOSTIC FINDINGS:  Lumbar spine:   1. Persistent prominent left subarticular zone inferiorly migrated extrusion at L2-L3 with severe spinal canal stenosis and impingement of the cauda equina nerve roots, particularly on the left. The extrusion is slightly smaller compared to the study from 2020. 2. Left subarticular zone protrusion at L1-L2 with contact and possible impingement of the traversing left L2 nerve root, similar to the prior study. Otherwise, no high-grade spinal canal or neural foraminal stenosis in the lumbar spine. 3. Multilevel facet arthropathy, most advanced at L4-L5, with bilateral facet joint effusions and perifacetal soft tissue edema at L2-L3 through L4-L5. 4. Degenerative marrow signal abnormality at L1 through L3, progressed since 2020.  PATIENT SURVEYS:  FOTO N/A  SENSATION:  Light touch: Deficits     LUMBARAROM/PROM: Able to sit without UE support, more upright posture  A/PROM A/PROM  11/09/2021 3/28 5/9  Flexion 50% 50% 50%  Extension 0% 0 25%  Right lateral flexion 25% 25% 50%  Left lateral flexion 25% 25% 50%  Right rotation 50% 50% 70%  Left rotation 50% 50% 70%   (Blank rows = not tested)  LE AROM/PROM: moderate limitations into hip flexion, ER, and IR; bilat knees WFL- lacking full TKE but symmetrical bilaterally  LE MMT:  MMT Right 11/09/2021 Left 11/09/2021 R 3/28 L 3/28 R 02/02/22 L 02/02/22  Hip flexion 4+/5 4+/5 4+/5 4+/5 5/5 5/5  Hip extension        Hip abduction 4+/5 4+/5 4+/5 4+/5    Hip adduction 4+/5 4+/5 5/5 5/5    Hip internal rotation        Hip external rotation        Knee flexion 4+/5 4+/5 4+/5 4+/5 5/5 5/5  Knee extension 5/5 5/5 5/5 5/5    Ankle dorsiflexion 4/5 4/5 4/5 4/5    Ankle plantarflexion 3/5 3/5 3/5 4-/5     (Blank rows = not tested)   FUNCTIONAL TESTS:  Eval: TUG: 21s STS transfers: bilat UE require- one  for push off and one for balance Standing balance: pt unable to perform 4 stage balance or marching without UE assistance  3/28  TUG: 17.2 STS transfers: 5XSTS 17.7s (no UE needed) Standing balance: pt unable to perform 4 stage balance or marching without UE assistance  02/09/22 TUG:18.5s 5 x STS: 18s from armed chair 1 ue support Standing balance:pt unable to perform stage 4 balance or marching without ue assistance   GAIT: Distance walked: 6MWT 683f.  12/29/21: 845 ft  02/09/22:870 ft Assistive device utilized: WEnvironmental consultant- 4 wheeled Level of assistance: Modified independence Comments: increased fwd trunk lean and UE pressure through walker, able to complete 3 full laps without signficant fatigue, no crossover stepping noted     TODAY'S TREATMENT  02/11/22 Pt seen for  aquatic therapy today.  Treatment took place in water 3.25-4.8 ft in depth at the [facility] pool. Temp of water was 91.  Pt entered/exited the pool via stairs (step through pattern) independently with bilat rail.  Walking in 4.3 ft forward and back multiple widths. Cues for heel strike and toe off  Seated on bench: flutter kicking; add/abd 3x20 reps  STS from bench onto step 2x5 reps. Requires ue support of therapist then  yellow noodle. Cues for weight shifting and coming to full standing, not leaning against bench Lumbar stretching in seated 4 x 20 s hold; right then left stretch 3 x 20s  29ft holding to wall: df, pf, marching and addct x 12    Ue supported by yellow hand buoys, moderate VC and demonstration and close supervision: 3way step   Standing step taps r/L 2x10 with unilateral ue support. X10 with ue supported by yellow noodel (very difficult)  Balance straddling noodle with yellow hand buoys -cycling x2 lengths -attempted skiing but pt unable to coordinate   Pt requires buoyancy for support and to offload joints with strengthening exercises. Viscosity of the water is needed for resistance of  strengthening; water current perturbations provides challenge to standing balance unsupported, requiring increased core activation.       PATIENT EDUCATION:  Education details: exam findings, exercise progression, muscle firing,  envelope of function, HEP, POC  Person educated: Patient Education method: Explanation, Demonstration, Tactile cues, Verbal cues, and Handouts Education comprehension: verbalized understanding, returned demonstration, verbal cues required, and tactile cues required   HOME EXERCISE PROGRAM: Utilized previous HEP printouts  ASSESSMENT:  CLINICAL IMPRESSION: Pt requiring increased assistance with STS from bench upon initiation of task, was able to progress to using noodle once confidence gained. Pt with particular difficulty with added 3 way step due to lack of coordination and balance difficulty. He is able to successfully complete with close supervision and VC.  Multiple short steps and slight lob. He reports fatigue upon completion. Goals ongoing     Objective impairments include Abnormal gait, decreased activity tolerance, decreased balance, decreased endurance, decreased knowledge of use of DME, decreased mobility, difficulty walking, decreased ROM, decreased strength, hypomobility, impaired flexibility, impaired sensation, improper body mechanics, and postural dysfunction. These impairments are limiting patient from cleaning, community activity, driving, meal prep, occupation, laundry, yard work, and shopping. Personal factors including Age, Behavior pattern, Fitness, Past/current experiences, Time since onset of injury/illness/exacerbation, and 3+ comorbidities:  are also affecting patient's functional outcome. Patient will benefit from skilled PT to address above impairments and improve overall function.  REHAB POTENTIAL: Fair   CLINICAL DECISION MAKING: Evolving/moderate complexity  EVALUATION COMPLEXITY: Low   GOALS:   SHORT TERM GOALS:  STG  Name Target Date Goal status  1 Pt will become independent with HEP in order to demonstrate synthesis of PT education.  Baseline:  12/21/2021 MET  2 Pt will be able to demonstrate STS without UE support in order to demonstrate functional improvement in LE function for self-care and house hold duties.  Baseline:  12/21/2021 MET  3 Pt will be able to walk at least 1200 ft during 6MWT in order to demonstrate improvement in functional endurance and mobility.  Baseline: 12/21/2021 ongoing   LONG TERM GOALS:   LTG Name Target Date Goal status  1 Pt will become independent with HEP in order to demonstrate synthesis of PT education.  Baseline: 03/09/2022 ongoing  2 Pt will be able to demonstrate TUG in under 10 sec in order  to demonstrate functional improvement in LE function, strength, balance, and mobility for safety with community ambulation.  Baseline: 02/01/2022 ongoing  3 Pt will be able to demonstrate/report ability to walk >10 mins without LE pain in order to demonstrate functional improvement and tolerance to exercise and community mobility.   Baseline: 03/09/2022 Ongoing 10 mins   4 Pt will be able to walk at least 3280 ft or 1032mduring 6MWT in order to demonstrate improvement in functional endurance and mobility for decrease in all cause mortality.  Baseline: 03/09/2022 ongoing   PLAN: PT FREQUENCY: 1-2x/week  PT DURATION: 4 weeks  PLANNED INTERVENTIONS: Therapeutic exercises, Therapeutic activity, Neuro Muscular re-education, Balance training, Gait training, Patient/Family education, Joint mobilization, Stair training, DME instructions, Aquatic Therapy, Dry Needling, Electrical stimulation, Spinal mobilization, Cryotherapy, Moist heat, scar mobilization, Splintting, Taping, Vasopneumatic device, Traction, Ultrasound, Ionotophoresis 447mml Dexamethasone, and Manual therapy  PLAN FOR NEXT SESSION:will need assistance in water; Trunk ROM and balance retraining.   MaStanton KidneyFTharon AquasZiemba  MPT 02/18/22 7:07 PM

## 2022-02-22 ENCOUNTER — Telehealth: Payer: Self-pay | Admitting: Family Medicine

## 2022-02-22 NOTE — Telephone Encounter (Signed)
Spoke with patient spouse she req CB 03/2022

## 2022-02-23 ENCOUNTER — Encounter (HOSPITAL_BASED_OUTPATIENT_CLINIC_OR_DEPARTMENT_OTHER): Payer: Self-pay | Admitting: Physical Therapy

## 2022-02-23 ENCOUNTER — Ambulatory Visit (HOSPITAL_BASED_OUTPATIENT_CLINIC_OR_DEPARTMENT_OTHER): Payer: PPO | Admitting: Physical Therapy

## 2022-02-23 DIAGNOSIS — R262 Difficulty in walking, not elsewhere classified: Secondary | ICD-10-CM

## 2022-02-23 DIAGNOSIS — M5451 Vertebrogenic low back pain: Secondary | ICD-10-CM

## 2022-02-23 DIAGNOSIS — M545 Low back pain, unspecified: Secondary | ICD-10-CM | POA: Diagnosis not present

## 2022-02-23 DIAGNOSIS — M6281 Muscle weakness (generalized): Secondary | ICD-10-CM

## 2022-02-23 NOTE — Therapy (Signed)
OUTPATIENT PHYSICAL THERAPY TREATMENT NOTE       Patient Name: Kirk [name] MRN: [id number] DOB:Sep 01, 1941, 81 y.o., male Today's Date: 02/23/2022  PCP: [name], MD REFERRING PROVIDER: [name], MD   PT End of Session - 02/23/22 1453     Visit Number 19    Number of Visits 23    Date for PT Re-Evaluation 03/09/22    Authorization Type HTA    Progress Note Due on Visit 25    PT Start Time 1446    PT Stop Time 5427    PT Time Calculation (min) 44 min    Activity Tolerance Patient tolerated treatment well    Behavior During Therapy WFL for tasks assessed/performed                  Past Medical History:  Diagnosis Date   Cataract    Congenital glaucoma    "both eyes operated on when I was 79 months old"   Cough    Disturbance of skin sensation    Diverticulosis of colon 2014   Diverticulum of esophagus, acquired    Dysphagia, unspecified(787.20)    Encounter for long-term (current) use of other medications    Herpes zoster without mention of complication    Hyperglycemia    patient denies   Hyperlipidemia    Hypertension    Hypertrophy of prostate without urinary obstruction and other lower urinary tract symptoms (LUTS)    Lumbago    Nevus, non-neoplastic    Osteoarthrosis, unspecified whether generalized or localized, unspecified site    pt. denies   Other premature beats    Pain in limb    Plantar fascial fibromatosis    Special screening for malignant neoplasm of prostate    Unspecified glaucoma(365.9)    Unspecified pruritic disorder    Unspecified vitamin D deficiency    Urinary frequency    Zenker's diverticulum    Past Surgical History:  Procedure Laterality Date   CATARACT EXTRACTION BILATERAL W/ ANTERIOR VITRECTOMY Bilateral 1977   COLONOSCOPY  09/06/2013   [name]   DIRECT LARYNGOSCOPY  10/22/2015   Cervical esophagoscopy.  Endoscopic esophageal diverticulotomy.    [facility]    congenital glaucoma   HERNIA REPAIR  1993   INCISIONAL HERNIA REPAIR  2008   LARYNGOSCOPY     with stapling of zenkers diverticulum   LUMBAR LAMINECTOMY/ DECOMPRESSION WITH MET-RX Left 08/16/2019   Procedure: Left Lumbar Two-Three Minimally Invasive Laminectomy and Microdiscectomy;  Surgeon: Judith Part, MD;  Location: [facility];  Service: Neurosurgery;  Laterality: Left;  Left Lumbar 2-3 Minimally invasive laminectomy and microdiscectomy   Patient Active Problem List   Diagnosis Date Noted   Central stenosis of spinal canal 05/26/2021   Actinic keratosis 06/16/2020   Bilateral leg weakness 06/16/2020   Onychomycosis 12/13/2019   Herniated lumbar intervertebral disc 08/16/2019   Degenerative lumbar spinal stenosis 02/14/2019   TSH elevation 02/25/2016   Orthostatic hypotension 02/17/2016   Syncope 01/07/2016   Esophageal diverticulum, acquired 10/22/2015   Xeroderma 08/20/2015   Bifascicular block 08/20/2015   Neuropathy ([place]) 02/05/2015   Diverticulosis of colon    Vitamin D deficiency    Hyperglycemia    Dysphagia    Diverticulum of esophagus, acquired    Hyperlipidemia    Glaucoma    BPH (benign prostatic hyperplasia)    Essential hypertension 02/01/2013   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    REFERRING DIAG: C62.376 (ICD-10-CM) -  Bilateral leg weakness  THERAPY DIAG:  Muscle weakness (generalized)  Difficulty walking  Vertebrogenic low back pain  PERTINENT HISTORY: Congenital glaucoma, Nov 2020 disc herniation surgery, vision deficits- lack of depth perception, and peripheral vision  PRECAUTIONS: none  SUBJECTIVE: "doing alright"   PAIN:  Are you having pain? no 0/10   PRECAUTIONS: None  WEIGHT BEARING RESTRICTIONS No  FALLS:  Has patient fallen in last 6 months? No, Number of falls: 0  LIVING ENVIRONMENT: Lives with: lives with their family and lives with their spouse Lives in: House/apartment Stairs: Yes; 5- cane on steps, rail on the L  Has  following equipment at home: Single point cane and Walker - 4 wheeled  OCCUPATION: retired   PLOF: Independent with household mobility with device  PATIENT GOALS : improve LE weakness. "Walk one day without the walker"   OBJECTIVE:   DIAGNOSTIC FINDINGS:  Lumbar spine:   1. Persistent prominent left subarticular zone inferiorly migrated extrusion at L2-L3 with severe spinal canal stenosis and impingement of the cauda equina nerve roots, particularly on the left. The extrusion is slightly smaller compared to the study from 2020. 2. Left subarticular zone protrusion at L1-L2 with contact and possible impingement of the traversing left L2 nerve root, similar to the prior study. Otherwise, no high-grade spinal canal or neural foraminal stenosis in the lumbar spine. 3. Multilevel facet arthropathy, most advanced at L4-L5, with bilateral facet joint effusions and perifacetal soft tissue edema at L2-L3 through L4-L5. 4. Degenerative marrow signal abnormality at L1 through L3, progressed since 2020.  PATIENT SURVEYS:  FOTO N/A  SENSATION:  Light touch: Deficits     LUMBARAROM/PROM: Able to sit without UE support, more upright posture  A/PROM A/PROM  11/09/2021 3/28 5/9  Flexion 50% 50% 50%  Extension 0% 0 25%  Right lateral flexion 25% 25% 50%  Left lateral flexion 25% 25% 50%  Right rotation 50% 50% 70%  Left rotation 50% 50% 70%   (Blank rows = not tested)  LE AROM/PROM: moderate limitations into hip flexion, ER, and IR; bilat knees WFL- lacking full TKE but symmetrical bilaterally  LE MMT:  MMT Right 11/09/2021 Left 11/09/2021 R 3/28 L 3/28 R 02/02/22 L 02/02/22  Hip flexion 4+/5 4+/5 4+/5 4+/5 5/5 5/5  Hip extension        Hip abduction 4+/5 4+/5 4+/5 4+/5    Hip adduction 4+/5 4+/5 5/5 5/5    Hip internal rotation        Hip external rotation        Knee flexion 4+/5 4+/5 4+/5 4+/5 5/5 5/5  Knee extension 5/5 5/5 5/5 5/5    Ankle dorsiflexion 4/5 4/5 4/5 4/5     Ankle plantarflexion 3/5 3/5 3/5 4-/5     (Blank rows = not tested)   FUNCTIONAL TESTS:  Eval: TUG: 21s STS transfers: bilat UE require- one for push off and one for balance Standing balance: pt unable to perform 4 stage balance or marching without UE assistance  3/28  TUG: 17.2 STS transfers: 5XSTS 17.7s (no UE needed) Standing balance: pt unable to perform 4 stage balance or marching without UE assistance  02/09/22 TUG:18.5s 5 x STS: 18s from armed chair 1 ue support Standing balance:pt unable to perform stage 4 balance or marching without ue assistance   GAIT: Distance walked: 6MWT 634f.  12/29/21: 845 ft  02/09/22:870 ft Assistive device utilized: WEnvironmental consultant- 4 wheeled Level of assistance: Modified independence Comments: increased fwd trunk lean and UE  pressure through walker, able to complete 3 full laps without signficant fatigue, no crossover stepping noted     TODAY'S TREATMENT   Pt seen for aquatic therapy today.  Treatment took place in water 3.25-4.8 ft in depth at the [facility] pool. Temp of water was 91.  Pt entered/exited the pool via stairs (step through pattern) independently with bilat rail.  Walking in 4.3 ft forward, side stepping and back multiple widths.  17f holding to yellow noodle then wall: df, pf, marching and addct x 12  Seated on 4th step: flutter kicking; add/abd 3x20 reps  STS from bench onto step 2x5 reps. Cues for weight shifting and coming to full standing. Pt with difficulty Lumbar stretching in seated 4 x 20 s hold; right then left stretch 3 x 20s  Standing  -step taps R/L x10 with unilateral ue support.  -step up leading R/L x 10  Balance straddling noodle with yellow hand buoys -cycling x2 lengths  Walking forward at comfortably speed between exercises for recovery   Pt requires buoyancy for support and to offload joints with strengthening exercises. Viscosity of the water is needed for resistance of strengthening; water  current perturbations provides challenge to standing balance unsupported, requiring increased core activation.       PATIENT EDUCATION:  Education details: exam findings, exercise progression, muscle firing,  envelope of function, HEP, POC  Person educated: Patient Education method: Explanation, Demonstration, Tactile cues, Verbal cues, and Handouts Education comprehension: verbalized understanding, returned demonstration, verbal cues required, and tactile cues required   HOME EXERCISE PROGRAM: Utilized previous HEP printouts  ASSESSMENT:  CLINICAL IMPRESSION: Pt continues to struggle with STS transfer using yellow noodle and standing off bench onto water step.  He is unable to shift weight forward without manual assist. PT allows pt to maneuver with skill to try to gain position/feel which caused him to fatigue today. He did ask for 1 seated rest period. Although pt progress towards goals is slow he is improving with toleration to activity and is walking further distances before requiring a rest period (as per wife with amb to and from pool).        Objective impairments include Abnormal gait, decreased activity tolerance, decreased balance, decreased endurance, decreased knowledge of use of DME, decreased mobility, difficulty walking, decreased ROM, decreased strength, hypomobility, impaired flexibility, impaired sensation, improper body mechanics, and postural dysfunction. These impairments are limiting patient from cleaning, community activity, driving, meal prep, occupation, laundry, yard work, and shopping. Personal factors including Age, Behavior pattern, Fitness, Past/current experiences, Time since onset of injury/illness/exacerbation, and 3+ comorbidities:  are also affecting patient's functional outcome. Patient will benefit from skilled PT to address above impairments and improve overall function.  REHAB POTENTIAL: Fair   CLINICAL DECISION MAKING: Evolving/moderate  complexity  EVALUATION COMPLEXITY: Low   GOALS:   SHORT TERM GOALS:  STG Name Target Date Goal status  1 Pt will become independent with HEP in order to demonstrate synthesis of PT education.  Baseline:  12/21/2021 MET  2 Pt will be able to demonstrate STS without UE support in order to demonstrate functional improvement in LE function for self-care and house hold duties.  Baseline:  12/21/2021 MET  3 Pt will be able to walk at least 1200 ft during 6MWT in order to demonstrate improvement in functional endurance and mobility.  Baseline: 12/21/2021 ongoing   LONG TERM GOALS:   LTG Name Target Date Goal status  1 Pt will become independent with HEP in order  to demonstrate synthesis of PT education.  Baseline: 03/09/2022 ongoing  2 Pt will be able to demonstrate TUG in under 10 sec in order to demonstrate functional improvement in LE function, strength, balance, and mobility for safety with community ambulation.  Baseline: 02/01/2022 ongoing  3 Pt will be able to demonstrate/report ability to walk >10 mins without LE pain in order to demonstrate functional improvement and tolerance to exercise and community mobility.   Baseline: 03/09/2022 Ongoing 10 mins   4 Pt will be able to walk at least 3280 ft or 1062mduring 6MWT in order to demonstrate improvement in functional endurance and mobility for decrease in all cause mortality.  Baseline: 03/09/2022 ongoing   PLAN: PT FREQUENCY: 1-2x/week  PT DURATION: 4 weeks  PLANNED INTERVENTIONS: Therapeutic exercises, Therapeutic activity, Neuro Muscular re-education, Balance training, Gait training, Patient/Family education, Joint mobilization, Stair training, DME instructions, Aquatic Therapy, Dry Needling, Electrical stimulation, Spinal mobilization, Cryotherapy, Moist heat, scar mobilization, Splintting, Taping, Vasopneumatic device, Traction, Ultrasound, Ionotophoresis 481mml Dexamethasone, and Manual therapy  PLAN FOR NEXT SESSION:will need  assistance in water; Trunk ROM and balance retraining.   MaStanton KidneyFTharon AquasZiemba MPT 02/23/22 6:13 PM

## 2022-02-25 ENCOUNTER — Ambulatory Visit (HOSPITAL_BASED_OUTPATIENT_CLINIC_OR_DEPARTMENT_OTHER): Payer: PPO | Admitting: Physical Therapy

## 2022-02-25 DIAGNOSIS — M6281 Muscle weakness (generalized): Secondary | ICD-10-CM

## 2022-02-25 DIAGNOSIS — M5451 Vertebrogenic low back pain: Secondary | ICD-10-CM

## 2022-02-25 DIAGNOSIS — M545 Low back pain, unspecified: Secondary | ICD-10-CM | POA: Diagnosis not present

## 2022-02-25 DIAGNOSIS — R262 Difficulty in walking, not elsewhere classified: Secondary | ICD-10-CM

## 2022-02-25 NOTE — Therapy (Signed)
OUTPATIENT PHYSICAL THERAPY TREATMENT NOTE       Patient Name: Kirk [name] MRN: [id number] DOB:Jun 17, 1941, 81 y.o., male Today's Date: 02/25/2022  PCP: [name], MD REFERRING PROVIDER: [name], MD   PT End of Session - 02/25/22 1457     Visit Number 20    Number of Visits 23    Date for PT Re-Evaluation 03/09/22    Authorization Type HTA    Progress Note Due on Visit 25    PT Start Time 1445    PT Stop Time 5638    PT Time Calculation (min) 45 min    Activity Tolerance Patient tolerated treatment well    Behavior During Therapy WFL for tasks assessed/performed                   Past Medical History:  Diagnosis Date   Cataract    Congenital glaucoma    "both eyes operated on when I was 74 months old"   Cough    Disturbance of skin sensation    Diverticulosis of colon 2014   Diverticulum of esophagus, acquired    Dysphagia, unspecified(787.20)    Encounter for long-term (current) use of other medications    Herpes zoster without mention of complication    Hyperglycemia    patient denies   Hyperlipidemia    Hypertension    Hypertrophy of prostate without urinary obstruction and other lower urinary tract symptoms (LUTS)    Lumbago    Nevus, non-neoplastic    Osteoarthrosis, unspecified whether generalized or localized, unspecified site    pt. denies   Other premature beats    Pain in limb    Plantar fascial fibromatosis    Special screening for malignant neoplasm of prostate    Unspecified glaucoma(365.9)    Unspecified pruritic disorder    Unspecified vitamin D deficiency    Urinary frequency    Zenker's diverticulum    Past Surgical History:  Procedure Laterality Date   CATARACT EXTRACTION BILATERAL W/ ANTERIOR VITRECTOMY Bilateral 1977   COLONOSCOPY  09/06/2013   [name]   DIRECT LARYNGOSCOPY  10/22/2015   Cervical esophagoscopy.  Endoscopic esophageal diverticulotomy.    [place]    congenital glaucoma   HERNIA REPAIR  1993   INCISIONAL HERNIA REPAIR  2008   LARYNGOSCOPY     with stapling of zenkers diverticulum   LUMBAR LAMINECTOMY/ DECOMPRESSION WITH MET-RX Left 08/16/2019   Procedure: Left Lumbar Two-Three Minimally Invasive Laminectomy and Microdiscectomy;  Surgeon: Judith Part, MD;  Location: [facility];  Service: Neurosurgery;  Laterality: Left;  Left Lumbar 2-3 Minimally invasive laminectomy and microdiscectomy   Patient Active Problem List   Diagnosis Date Noted   Central stenosis of spinal canal 05/26/2021   Actinic keratosis 06/16/2020   Bilateral leg weakness 06/16/2020   Onychomycosis 12/13/2019   Herniated lumbar intervertebral disc 08/16/2019   Degenerative lumbar spinal stenosis 02/14/2019   TSH elevation 02/25/2016   Orthostatic hypotension 02/17/2016   Syncope 01/07/2016   Esophageal diverticulum, acquired 10/22/2015   Xeroderma 08/20/2015   Bifascicular block 08/20/2015   Neuropathy ([place]) 02/05/2015   Diverticulosis of colon    Vitamin D deficiency    Hyperglycemia    Dysphagia    Diverticulum of esophagus, acquired    Hyperlipidemia    Glaucoma    BPH (benign prostatic hyperplasia)    Essential hypertension 02/01/2013   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    REFERRING DIAG: V56.433 (  ICD-10-CM) - Bilateral leg weakness  THERAPY DIAG:  Muscle weakness (generalized)  Difficulty walking  Vertebrogenic low back pain  PERTINENT HISTORY: Congenital glaucoma, Nov 2020 disc herniation surgery, vision deficits- lack of depth perception, and peripheral vision  PRECAUTIONS: none  SUBJECTIVE: "doing alright"   PAIN:  Are you having pain? no 0/10   PRECAUTIONS: None  WEIGHT BEARING RESTRICTIONS No  FALLS:  Has patient fallen in last 6 months? No, Number of falls: 0  LIVING ENVIRONMENT: Lives with: lives with their family and lives with their spouse Lives in: House/apartment Stairs: Yes; 5- cane on steps, rail on the L  Has  following equipment at home: Single point cane and Walker - 4 wheeled  OCCUPATION: retired   PLOF: Independent with household mobility with device  PATIENT GOALS : improve LE weakness. "Walk one day without the walker"   OBJECTIVE:   DIAGNOSTIC FINDINGS:  Lumbar spine:   1. Persistent prominent left subarticular zone inferiorly migrated extrusion at L2-L3 with severe spinal canal stenosis and impingement of the cauda equina nerve roots, particularly on the left. The extrusion is slightly smaller compared to the study from 2020. 2. Left subarticular zone protrusion at L1-L2 with contact and possible impingement of the traversing left L2 nerve root, similar to the prior study. Otherwise, no high-grade spinal canal or neural foraminal stenosis in the lumbar spine. 3. Multilevel facet arthropathy, most advanced at L4-L5, with bilateral facet joint effusions and perifacetal soft tissue edema at L2-L3 through L4-L5. 4. Degenerative marrow signal abnormality at L1 through L3, progressed since 2020.  PATIENT SURVEYS:  FOTO N/A  SENSATION:  Light touch: Deficits     LUMBARAROM/PROM: Able to sit without UE support, more upright posture  A/PROM A/PROM  11/09/2021 3/28 5/9  Flexion 50% 50% 50%  Extension 0% 0 25%  Right lateral flexion 25% 25% 50%  Left lateral flexion 25% 25% 50%  Right rotation 50% 50% 70%  Left rotation 50% 50% 70%   (Blank rows = not tested)  LE AROM/PROM: moderate limitations into hip flexion, ER, and IR; bilat knees WFL- lacking full TKE but symmetrical bilaterally  LE MMT:  MMT Right 11/09/2021 Left 11/09/2021 R 3/28 L 3/28 R 02/02/22 L 02/02/22  Hip flexion 4+/5 4+/5 4+/5 4+/5 5/5 5/5  Hip extension        Hip abduction 4+/5 4+/5 4+/5 4+/5    Hip adduction 4+/5 4+/5 5/5 5/5    Hip internal rotation        Hip external rotation        Knee flexion 4+/5 4+/5 4+/5 4+/5 5/5 5/5  Knee extension 5/5 5/5 5/5 5/5    Ankle dorsiflexion 4/5 4/5 4/5 4/5     Ankle plantarflexion 3/5 3/5 3/5 4-/5     (Blank rows = not tested)   FUNCTIONAL TESTS:  Eval: TUG: 21s STS transfers: bilat UE require- one for push off and one for balance Standing balance: pt unable to perform 4 stage balance or marching without UE assistance  3/28  TUG: 17.2 STS transfers: 5XSTS 17.7s (no UE needed) Standing balance: pt unable to perform 4 stage balance or marching without UE assistance  02/09/22 TUG:18.5s 5 x STS: 18s from armed chair 1 ue support Standing balance:pt unable to perform stage 4 balance or marching without ue assistance   GAIT: Distance walked: 6MWT 645f.  12/29/21: 845 ft  02/09/22:870 ft Assistive device utilized: WEnvironmental consultant- 4 wheeled Level of assistance: Modified independence Comments: increased fwd trunk lean  and UE pressure through walker, able to complete 3 full laps without signficant fatigue, no crossover stepping noted     TODAY'S TREATMENT   Pt seen for aquatic therapy today.  Treatment took place in water 3.25-4.8 ft in depth at the [facility] pool. Temp of water was 91.  Pt entered/exited the pool via stairs (step through pattern) independently with bilat rail.  Walking in 4.3 ft forward, side stepping and back multiple widths.  40f holding to yellow noodle then wall: df, pf, marching and addct x 12  Seated on 4th step: flutter kicking; add/abd 3x25 reps  STS from 3rd step 2x5 reps using 1 UE support and assist on handrail.   Adductor sets squeezing buoyancy ball x10 with 10s hold Lumbar stretching in seated 4 x 20 s hold; right then left stretch 3 x 20s  Standing  Using 11 lb ball vertical lifts 2 x10 -step taps R/L x10 with unilateral ue support.  -step up leading R/L onto 2nd step ue support and assist 2x5. Cues for decreased UE use/more LE [name]. Good eccentric control stepping down. Standing balance feet shoulder width apart yellow hand buoys ue support: head movement vertical then horizontal 2x10.  Cues for  slowed pace and repositioning weight (shifting) to maintain static standing balance. Pt with 5 LOB out of 10 head movements with ea trial   Cycling straddling noodle with yellow hand buoys x 4 lengths. Walking forward at comfortable speed between exercises for recovery   Pt requires buoyancy for support and to offload joints with strengthening exercises. Viscosity of the water is needed for resistance of strengthening; water current perturbations provides challenge to standing balance unsupported, requiring increased core activation.       PATIENT EDUCATION:  Education details: exam findings, exercise progression, muscle firing,  envelope of function, HEP, POC  Person educated: Patient Education method: Explanation, Demonstration, Tactile cues, Verbal cues, and Handouts Education comprehension: verbalized understanding, returned demonstration, verbal cues required, and tactile cues required   HOME EXERCISE PROGRAM: Utilized previous HEP printouts  ASSESSMENT:  CLINICAL IMPRESSION: Focused on standing balance. From STS position pt with consistent LOB backward. Standing with 2 foam hand buoy support LOB is forward. Discussed with pt and wife the improbability of safe use of cane.  Pt reports he feels therapy has not helped him but he has had notable strength gains as evident with ability to complete STS transfers (wife reports pt stood in church for the first time yesterday from pew), climb stairs in and out of pool without assistance, with ue support of yellow noodle ( more support vs hand buoys) is able to maneuver throughout pool submerged at all levels without loss of control (increased speed) or balance and completes indep, initially pt required mod assist.  Aerobic capacity has improved with walking to and from pool without rest periods.  He may have reached his maximal potential with therapy to be determined by next on land visit. Pt remains relatively inactive at home which will  probably not change.  Pt may be candidate going forward for the aquatic group class.         Objective impairments include Abnormal gait, decreased activity tolerance, decreased balance, decreased endurance, decreased knowledge of use of DME, decreased mobility, difficulty walking, decreased ROM, decreased strength, hypomobility, impaired flexibility, impaired sensation, improper body mechanics, and postural dysfunction. These impairments are limiting patient from cleaning, community activity, driving, meal prep, occupation, laundry, yard work, and shopping. Personal factors including Age, Behavior pattern, Fitness,  Past/current experiences, Time since onset of injury/illness/exacerbation, and 3+ comorbidities:  are also affecting patient's functional outcome. Patient will benefit from skilled PT to address above impairments and improve overall function.  REHAB POTENTIAL: Fair   CLINICAL DECISION MAKING: Evolving/moderate complexity  EVALUATION COMPLEXITY: Low   GOALS:   SHORT TERM GOALS:  STG Name Target Date Goal status  1 Pt will become independent with HEP in order to demonstrate synthesis of PT education.  Baseline:  12/21/2021 MET  2 Pt will be able to demonstrate STS without UE support in order to demonstrate functional improvement in LE function for self-care and house hold duties.  Baseline:  12/21/2021 MET  3 Pt will be able to walk at least 1200 ft during 6MWT in order to demonstrate improvement in functional endurance and mobility.  Baseline: 12/21/2021 ongoing   LONG TERM GOALS:   LTG Name Target Date Goal status  1 Pt will become independent with HEP in order to demonstrate synthesis of PT education.  Baseline: 03/09/2022 ongoing  2 Pt will be able to demonstrate TUG in under 10 sec in order to demonstrate functional improvement in LE function, strength, balance, and mobility for safety with community ambulation.  Baseline: 02/01/2022 ongoing  3 Pt will be able to  demonstrate/report ability to walk >10 mins without LE pain in order to demonstrate functional improvement and tolerance to exercise and community mobility.   Baseline: 03/09/2022 Ongoing 10 mins   4 Pt will be able to walk at least 3280 ft or 1071mduring 6MWT in order to demonstrate improvement in functional endurance and mobility for decrease in all cause mortality.  Baseline: 03/09/2022 ongoing   PLAN: PT FREQUENCY: 1-2x/week  PT DURATION: 4 weeks  PLANNED INTERVENTIONS: Therapeutic exercises, Therapeutic activity, Neuro Muscular re-education, Balance training, Gait training, Patient/Family education, Joint mobilization, Stair training, DME instructions, Aquatic Therapy, Dry Needling, Electrical stimulation, Spinal mobilization, Cryotherapy, Moist heat, scar mobilization, Splintting, Taping, Vasopneumatic device, Traction, Ultrasound, Ionotophoresis 468mml Dexamethasone, and Manual therapy  PLAN FOR NEXT SESSION: Trunk ROM and balance retraining.   MaStanton KidneyFTharon AquasZiemba MPT 02/25/22 3:57 PM

## 2022-03-02 ENCOUNTER — Ambulatory Visit (HOSPITAL_BASED_OUTPATIENT_CLINIC_OR_DEPARTMENT_OTHER): Payer: PPO | Admitting: Physical Therapy

## 2022-03-02 ENCOUNTER — Encounter (HOSPITAL_BASED_OUTPATIENT_CLINIC_OR_DEPARTMENT_OTHER): Payer: Self-pay | Admitting: Physical Therapy

## 2022-03-02 DIAGNOSIS — R262 Difficulty in walking, not elsewhere classified: Secondary | ICD-10-CM

## 2022-03-02 DIAGNOSIS — M545 Low back pain, unspecified: Secondary | ICD-10-CM | POA: Diagnosis not present

## 2022-03-02 DIAGNOSIS — M5451 Vertebrogenic low back pain: Secondary | ICD-10-CM

## 2022-03-02 DIAGNOSIS — M6281 Muscle weakness (generalized): Secondary | ICD-10-CM

## 2022-03-02 NOTE — Therapy (Signed)
OUTPATIENT PHYSICAL THERAPY TREATMENT NOTE  PHYSICAL THERAPY DISCHARGE SUMMARY  Visits from Start of Care: 21  Plan: Patient agrees to discharge.  Patient goals were mostly met. Patient is being discharged due to meeting current max potential with therapy.          Patient Name: Kirk [name] MRN: [id number] DOB:Sep 09, 1941, 81 y.o., male Today's Date: 03/02/2022  PCP: [name], MD REFERRING PROVIDER: [name], MD   PT End of Session - 03/02/22 1501     Visit Number 21    Number of Visits 23    Date for PT Re-Evaluation 03/09/22    Authorization Type HTA    Progress Note Due on Visit 25    PT Start Time 1430    PT Stop Time 1500    PT Time Calculation (min) 30 min    Activity Tolerance Patient tolerated treatment well    Behavior During Therapy WFL for tasks assessed/performed                    Past Medical History:  Diagnosis Date   Cataract    Congenital glaucoma    "both eyes operated on when I was 71 months old"   Cough    Disturbance of skin sensation    Diverticulosis of colon 2014   Diverticulum of esophagus, acquired    Dysphagia, unspecified(787.20)    Encounter for long-term (current) use of other medications    Herpes zoster without mention of complication    Hyperglycemia    patient denies   Hyperlipidemia    Hypertension    Hypertrophy of prostate without urinary obstruction and other lower urinary tract symptoms (LUTS)    Lumbago    Nevus, non-neoplastic    Osteoarthrosis, unspecified whether generalized or localized, unspecified site    pt. denies   Other premature beats    Pain in limb    Plantar fascial fibromatosis    Special screening for malignant neoplasm of prostate    Unspecified glaucoma(365.9)    Unspecified pruritic disorder    Unspecified vitamin D deficiency    Urinary frequency    Zenker's diverticulum    Past Surgical History:  Procedure Laterality Date   CATARACT EXTRACTION BILATERAL W/  ANTERIOR VITRECTOMY Bilateral 1977   COLONOSCOPY  09/06/2013   [name]   DIRECT LARYNGOSCOPY  10/22/2015   Cervical esophagoscopy.  Endoscopic esophageal diverticulotomy.    [place]   congenital glaucoma   HERNIA REPAIR  1993   INCISIONAL HERNIA REPAIR  2008   LARYNGOSCOPY     with stapling of zenkers diverticulum   LUMBAR LAMINECTOMY/ DECOMPRESSION WITH MET-RX Left 08/16/2019   Procedure: Left Lumbar Two-Three Minimally Invasive Laminectomy and Microdiscectomy;  Surgeon: Judith Part, MD;  Location: [facility];  Service: Neurosurgery;  Laterality: Left;  Left Lumbar 2-3 Minimally invasive laminectomy and microdiscectomy   Patient Active Problem List   Diagnosis Date Noted   Central stenosis of spinal canal 05/26/2021   Actinic keratosis 06/16/2020   Bilateral leg weakness 06/16/2020   Onychomycosis 12/13/2019   Herniated lumbar intervertebral disc 08/16/2019   Degenerative lumbar spinal stenosis 02/14/2019   TSH elevation 02/25/2016   Orthostatic hypotension 02/17/2016   Syncope 01/07/2016   Esophageal diverticulum, acquired 10/22/2015   Xeroderma 08/20/2015   Bifascicular block 08/20/2015   Neuropathy ([place]) 02/05/2015   Diverticulosis of colon    Vitamin D deficiency    Hyperglycemia  Dysphagia    Diverticulum of esophagus, acquired    Hyperlipidemia    Glaucoma    BPH (benign prostatic hyperplasia)    Essential hypertension 02/01/2013   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    REFERRING DIAG: R29.898 (ICD-10-CM) - Bilateral leg weakness  THERAPY DIAG:  Muscle weakness (generalized)  Difficulty walking  Vertebrogenic low back pain  PERTINENT HISTORY: Congenital glaucoma, Nov 2020 disc herniation surgery, vision deficits- lack of depth perception, and peripheral vision  PRECAUTIONS: none  SUBJECTIVE: Pt states he feels about the same. Wife reports large improvements with mobility.  PAIN:  Are you having pain?  no 0/10   PRECAUTIONS: None  WEIGHT BEARING RESTRICTIONS No  FALLS:  Has patient fallen in last 6 months? No, Number of falls: 0  LIVING ENVIRONMENT: Lives with: lives with their family and lives with their spouse Lives in: House/apartment Stairs: Yes; 5- cane on steps, rail on the L  Has following equipment at home: Single point cane and Walker - 4 wheeled  OCCUPATION: retired   PLOF: Independent with household mobility with device  PATIENT GOALS : improve LE weakness. "Walk one day without the walker"   OBJECTIVE:   DIAGNOSTIC FINDINGS:  Lumbar spine:   1. Persistent prominent left subarticular zone inferiorly migrated extrusion at L2-L3 with severe spinal canal stenosis and impingement of the cauda equina nerve roots, particularly on the left. The extrusion is slightly smaller compared to the study from 2020. 2. Left subarticular zone protrusion at L1-L2 with contact and possible impingement of the traversing left L2 nerve root, similar to the prior study. Otherwise, no high-grade spinal canal or neural foraminal stenosis in the lumbar spine. 3. Multilevel facet arthropathy, most advanced at L4-L5, with bilateral facet joint effusions and perifacetal soft tissue edema at L2-L3 through L4-L5. 4. Degenerative marrow signal abnormality at L1 through L3, progressed since 2020.  PATIENT SURVEYS:  FOTO N/A  SENSATION:  Light touch: Deficits     LUMBARAROM/PROM: Able to sit without UE support, more upright posture  A/PROM A/PROM  11/09/2021 3/28 5/9 5/30  Flexion 50% 50% 50% 75%  Extension 0% 0 25% 25%  Right lateral flexion 25% 25% 50% 50%  Left lateral flexion 25% 25% 50% 50%  Right rotation 50% 50% 70% 70%  Left rotation 50% 50% 70% 70%   (Blank rows = not tested)  LE AROM/PROM: moderate limitations into hip flexion, ER, and IR; bilat knees WFL- lacking full TKE but symmetrical bilaterally  LE MMT:  MMT Right 11/09/2021 Left 11/09/2021 R 3/28 L 3/28  R 02/02/22 L 02/02/22 R 5/30 L 5/30  Hip flexion 4+/5 4+/5 4+/5 4+/5 5/5 5/5 5/5 5/5  Hip extension          Hip abduction 4+/5 4+/5 4+/5 4+/5   4+/5 5/5  Hip adduction 4+/5 4+/5 5/5 5/5      Hip internal rotation          Hip external rotation          Knee flexion 4+/5 4+/5 4+/5 4+/5 5/5 5/5 5/5 5/5  Knee extension 5/5 5/5 5/5 5/5   5/5 5/5  Ankle dorsiflexion 4/5 4/5 4/5 4/5   4/5 4/5  Ankle plantarflexion 3/5 3/5 3/5 4-/5   4-/5 4-/5   (Blank rows = not tested)   FUNCTIONAL TESTS:  Eval: TUG: 21s STS transfers: bilat UE require- one for push off and one for balance Standing balance: pt unable to perform 4 stage balance or marching without UE assistance  3/28  TUG: 17.2 STS transfers: 5XSTS 17.7s (no UE needed) Standing balance: pt unable to perform 4 stage balance or marching without UE assistance  02/09/22 TUG:18.5s 5 x STS: 18s from armed chair 1 ue support Standing balance:pt unable to perform stage 4 balance or marching without ue assistance  5/30  TUG: 19.0s 5 x STS: 18.2s from armed chair 1 ue support Standing balance:pt unable to perform stage 4 balance or marching without ue assistance  GAIT: Distance walked: 6MWT 653f.  12/29/21: 845 ft  02/09/22:870 ft;  03/02/22: 753 ft Assistive device utilized: WEnvironmental consultant- 4 wheeled Level of assistance: Modified independence Comments: increased fwd trunk lean and UE pressure through walker      PATIENT EDUCATION:  Education details: exam findings, exercise progression, muscle firing,  envelope of function, HEP, POC  Person educated: Patient Education method: Explanation, Demonstration, Tactile cues, Verbal cues, and Handouts Education comprehension: verbalized understanding, returned demonstration, verbal cues required, and tactile cues required   HOME EXERCISE PROGRAM: Utilized previous HEP printouts  ASSESSMENT:  CLINICAL IMPRESSION: Pt has made large improvement since starting therapy but has reach functional  plateau since last assessment at this time. Pt has similar objective measurements from previous progress note to today. However, pt's wife reports significant changes in functional mobility and ability to perform transfers independently. Pt has met mobility STG and LTG, but is largely limited by LE strength and endurance. Per pt report, he is relatively inactive throughout the day so PT reviewed importance of continued exercise/physical activity at home in order to maintain/progress functional strength and endurance. At this time, pt is [facility] Pt relies heavily on rollator due to balance and vision deficits. D/C this current episode of care and [facility]     Objective impairments include Abnormal gait, decreased activity tolerance, decreased balance, decreased endurance, decreased knowledge of use of DME, decreased mobility, difficulty walking, decreased ROM, decreased strength, hypomobility, impaired flexibility, impaired sensation, improper body mechanics, and postural dysfunction. These impairments are limiting patient from cleaning, community activity, driving, meal prep, occupation, laundry, yard work, and shopping. Personal factors including Age, Behavior pattern, Fitness, Past/current experiences, Time since onset of injury/illness/exacerbation, and 3+ comorbidities:  are also affecting patient's functional outcome. Patient will benefit from skilled PT to address above impairments and improve overall function.  REHAB POTENTIAL: Fair   CLINICAL DECISION MAKING: Evolving/moderate complexity  EVALUATION COMPLEXITY: Low   GOALS:   SHORT TERM GOALS:  STG Name Target Date Goal status  1 Pt will become independent with HEP in order to demonstrate synthesis of PT education.  Baseline:  12/21/2021 MET  2 Pt will be able to demonstrate STS without UE support in order to demonstrate functional  improvement in LE function for self-care and house hold duties.  Baseline:  12/21/2021 MET  3 Pt will be able to walk at least 1200 ft during 6MWT in order to demonstrate improvement in functional endurance and mobility.  Baseline: 12/21/2021 Not met   LONG TERM GOALS:   LTG Name Target Date Goal status  1 Pt will become independent with HEP in order to demonstrate synthesis of PT education.  Baseline: 03/09/2022 MET  2 Pt will be able to demonstrate TUG in under 10 sec in order to demonstrate functional improvement in LE function, strength, balance, and mobility for safety with community ambulation.  Baseline: 02/01/2022 Not met  3 Pt will be able to demonstrate/report ability  to walk >10 mins without LE pain in order to demonstrate functional improvement and tolerance to exercise and community mobility.   Baseline: 03/09/2022 Not met 10 mins   4 Pt will be able to walk at least 3280 ft or 1031mduring 6MWT in order to demonstrate improvement in functional endurance and mobility for decrease in all cause mortality.  Baseline: 03/09/2022 Not met   PLAN: PT FREQUENCY: 1-2x/week  PT DURATION: 4 weeks  PLANNED INTERVENTIONS: Therapeutic exercises, Therapeutic activity, Neuro Muscular re-education, Balance training, Gait training, Patient/Family education, Joint mobilization, Stair training, DME instructions, Aquatic Therapy, Dry Needling, Electrical stimulation, Spinal mobilization, Cryotherapy, Moist heat, scar mobilization, Splintting, Taping, Vasopneumatic device, Traction, Ultrasound, Ionotophoresis 432mml Dexamethasone, and Manual therapy   AlDaleen BoT, DPT 03/02/22 3:09 PM

## 2022-04-26 ENCOUNTER — Telehealth: Payer: Self-pay | Admitting: Family Medicine

## 2022-04-26 NOTE — Telephone Encounter (Signed)
Copied from [place] [phone] [phone]. Topic: Medicare AWV >> Apr 26, 2022 10:16 AM Devoria Glassing wrote: Reason for FKC:LEXNTZ patient to schedule Annual Wellness Visit.  Please schedule with Nurse Health Advisor Charlott Rakes, RN at [facility]. This appt can be telephone or office visit. Please call [phone] ask for [facility]

## 2022-05-20 DIAGNOSIS — H2703 Aphakia, bilateral: Secondary | ICD-10-CM | POA: Diagnosis not present

## 2022-05-20 DIAGNOSIS — H402233 Chronic angle-closure glaucoma, bilateral, severe stage: Secondary | ICD-10-CM | POA: Diagnosis not present

## 2022-05-20 DIAGNOSIS — H04123 Dry eye syndrome of bilateral lacrimal glands: Secondary | ICD-10-CM | POA: Diagnosis not present

## 2022-05-20 DIAGNOSIS — H43393 Other vitreous opacities, bilateral: Secondary | ICD-10-CM | POA: Diagnosis not present

## 2022-09-23 DIAGNOSIS — H2703 Aphakia, bilateral: Secondary | ICD-10-CM | POA: Diagnosis not present

## 2022-09-23 DIAGNOSIS — H02423 Myogenic ptosis of bilateral eyelids: Secondary | ICD-10-CM | POA: Diagnosis not present

## 2022-09-23 DIAGNOSIS — H402233 Chronic angle-closure glaucoma, bilateral, severe stage: Secondary | ICD-10-CM | POA: Diagnosis not present

## 2022-09-23 DIAGNOSIS — H04123 Dry eye syndrome of bilateral lacrimal glands: Secondary | ICD-10-CM | POA: Diagnosis not present

## 2022-11-25 ENCOUNTER — Emergency Department (HOSPITAL_BASED_OUTPATIENT_CLINIC_OR_DEPARTMENT_OTHER): Payer: PPO | Admitting: Radiology

## 2022-11-25 ENCOUNTER — Emergency Department (HOSPITAL_BASED_OUTPATIENT_CLINIC_OR_DEPARTMENT_OTHER)
Admission: EM | Admit: 2022-11-25 | Discharge: 2022-11-25 | Disposition: A | Discharge status: Home / Self Care | Payer: PPO | Attending: Emergency Medicine | Admitting: Emergency Medicine

## 2022-11-25 ENCOUNTER — Encounter (HOSPITAL_BASED_OUTPATIENT_CLINIC_OR_DEPARTMENT_OTHER): Payer: Self-pay

## 2022-11-25 ENCOUNTER — Other Ambulatory Visit: Payer: Self-pay

## 2022-11-25 DIAGNOSIS — R0789 Other chest pain: Secondary | ICD-10-CM | POA: Diagnosis not present

## 2022-11-25 DIAGNOSIS — I10 Essential (primary) hypertension: Secondary | ICD-10-CM | POA: Diagnosis not present

## 2022-11-25 DIAGNOSIS — Z79899 Other long term (current) drug therapy: Secondary | ICD-10-CM | POA: Diagnosis not present

## 2022-11-25 DIAGNOSIS — R079 Chest pain, unspecified: Secondary | ICD-10-CM | POA: Insufficient documentation

## 2022-11-25 DIAGNOSIS — M25512 Pain in left shoulder: Secondary | ICD-10-CM

## 2022-11-25 LAB — TROPONIN I (HIGH SENSITIVITY): Troponin I (High Sensitivity): 7 ng/L (ref <18)

## 2022-11-25 LAB — BASIC METABOLIC PANEL
Anion gap: 8 (ref 5–15)
BUN: 16 mg/dL (ref 8–23)
CO2: 26 mmol/L (ref 22–32)
Calcium: 10 mg/dL (ref 8.9–10.3)
Chloride: 99 mmol/L (ref 98–111)
Creatinine, Ser: 0.85 mg/dL (ref 0.61–1.24)
GFR, Estimated: >60 mL/min (ref >60)
Glucose, Bld: 153 mg/dL — ABNORMAL HIGH (ref 70–99)
Potassium: 5.3 mmol/L — ABNORMAL HIGH (ref 3.5–5.1)
Sodium: 133 mmol/L — ABNORMAL LOW (ref 135–145)

## 2022-11-25 LAB — CBC WITH DIFFERENTIAL/PLATELET
Abs Immature Granulocytes: 0.03 10*3/uL (ref 0.00–0.07)
Basophils Absolute: 0.1 10*3/uL (ref 0.0–0.1)
Basophils Relative: 1 %
Eosinophils Absolute: 0.2 10*3/uL (ref 0.0–0.5)
Eosinophils Relative: 2 %
HCT: 49.9 % (ref 39.0–52.0)
Hemoglobin: 17.1 g/dL — ABNORMAL HIGH (ref 13.0–17.0)
Immature Granulocytes: 0 %
Lymphocytes Relative: 21 %
Lymphs Abs: 1.8 10*3/uL (ref 0.7–4.0)
MCH: 30.2 pg (ref 26.0–34.0)
MCHC: 34.3 g/dL (ref 30.0–36.0)
MCV: 88.2 fL (ref 80.0–100.0)
Monocytes Absolute: 0.8 10*3/uL (ref 0.1–1.0)
Monocytes Relative: 10 %
Neutro Abs: 5.8 10*3/uL (ref 1.7–7.7)
Neutrophils Relative %: 66 %
Platelets: 200 10*3/uL (ref 150–400)
RBC: 5.66 MIL/uL (ref 4.22–5.81)
RDW: 13.5 % (ref 11.5–15.5)
WBC: 8.7 10*3/uL (ref 4.0–10.5)
nRBC: 0 % (ref 0.0–0.2)

## 2022-11-25 MED ORDER — NAPROXEN 500 MG PO TABS: 500.0000 mg | ORAL_TABLET | Freq: Two times a day (BID) | ORAL | 0 refills | Status: DC

## 2022-11-25 NOTE — Discharge Instructions (Signed)
You were evaluated today for shoulder pain.  Your imaging was reassuring for no signs of fracture or dislocation.  Your lab work was also grossly unremarkable.  Please follow-up as needed with your orthopedic provider.  I have prescribed a short course of Naprosyn for inflammation.  Please take as directed.

## 2022-11-25 NOTE — ED Provider Notes (Signed)
[facility] Provider Note   CSN: [id number] Arrival date & time: 11/25/22  1419     History  Chief Complaint  Patient presents with   Shoulder Pain    GABRIEL RENDINA is a 82 y.o. male.  Patient presents the emergency department with multiple complaints.  Patient states that 2 days ago he fell while returning his trash can to his carport, landing backwards against a storage building with his left shoulder.  His chief complaint today is left shoulder pain.  Upon assessment patient also begins to complain of intermittent chest pains described as sharp pains in the left side of his chest.  He denies shortness of breath, abdominal pain, nausea, vomiting, radiation of symptoms.  Past medical history significant for hypertension, hyperglycemia, dysphagia, neuropathy, xeroderma, syncope, orthostatic hypotension, degenerative lumbar spinal stenosis, central stenosis of spinal canal  HPI     Home Medications Prior to Admission medications   Medication Sig Start Date End Date Taking? Authorizing Provider  naproxen (NAPROSYN) 500 MG tablet Take 1 tablet (500 mg total) by mouth 2 (two) times daily. 11/25/22  Yes Dorothyann Peng, PA-C  acetaminophen (TYLENOL) 325 MG tablet Take 650 mg by mouth every 6 (six) hours as needed (pain).     [provider]  brimonidine (ALPHAGAN) 0.2 % ophthalmic solution Place 1 drop into both eyes 2 (two) times daily.  01/25/18   [provider]  Cholecalciferol 25 MCG (1000 UT) tablet Take 1,000 Units by mouth daily.    [provider]  gabapentin (NEURONTIN) 100 MG capsule Take 1 capsule (100 mg total) by mouth at bedtime. 10/16/21   [name], MD  lisinopril-hydrochlorothiazide (ZESTORETIC) 20-12.5 MG tablet TAKE 1/2 TABLET BY MOUTH DAILY 10/26/21   [name], MD  timolol (TIMOPTIC) 0.5 % ophthalmic solution Place 1 drop into the right eye daily.  08/20/13   [provider]       Allergies    Reserpine and Sulfa antibiotics    Review of Systems   Review of Systems  Constitutional:  Negative for fever.  Respiratory:  Negative for shortness of breath.   Cardiovascular:  Positive for chest pain.  Gastrointestinal:  Negative for abdominal pain, nausea and vomiting.  Musculoskeletal:  Positive for arthralgias.    Physical Exam Updated Vital Signs BP (!) 161/97 (BP Location: Right Arm)   Pulse 99   Temp 97.9 F (36.6 C) (Oral)   Resp 18   Ht '6\' 1"'$  (1.854 m)   Wt 89.8 kg   SpO2 94%   BMI 26.12 kg/m  Physical Exam Vitals and nursing note reviewed.  Constitutional:      General: He is not in acute distress.    Appearance: He is well-developed.  HENT:     Head: Normocephalic and atraumatic.  Eyes:     Conjunctiva/sclera: Conjunctivae normal.  Cardiovascular:     Rate and Rhythm: Normal rate and regular rhythm.     Heart sounds: No murmur heard. Pulmonary:     Effort: Pulmonary effort is normal. No respiratory distress.     Breath sounds: Normal breath sounds.  Abdominal:     Palpations: Abdomen is soft.     Tenderness: There is no abdominal tenderness.  Musculoskeletal:        General: No swelling. Normal range of motion.     Cervical back: Neck supple.     Comments: Patient with normal range of motion of the left upper extremity.  Very mild tenderness to palpation around the upper left humerus.  Skin:    General: Skin is warm and dry.     Capillary Refill: Capillary refill takes less than 2 seconds.  Neurological:     Mental Status: He is alert.  Psychiatric:        Mood and Affect: Mood normal.     ED Results / Procedures / Treatments   Labs (all labs ordered are listed, but only abnormal results are displayed) Labs Reviewed  BASIC METABOLIC PANEL - Abnormal; Notable for the following components:      Result Value   Sodium 133 (*)    Potassium 5.3 (*)    Glucose, Bld 153 (*)    All other components within normal limits  CBC WITH  DIFFERENTIAL/PLATELET - Abnormal; Notable for the following components:   Hemoglobin 17.1 (*)    All other components within normal limits  TROPONIN I (HIGH SENSITIVITY)    EKG None  Radiology DG Shoulder Left  Result Date: 11/25/2022 CLINICAL DATA:  Left shoulder pain after a fall a week ago. EXAM: LEFT SHOULDER - 2+ VIEW COMPARISON:  None Available. FINDINGS: Degenerative changes in the acromioclavicular and glenohumeral joints. Mild osteophyte formation. No evidence of acute fracture or dislocation. No focal bone lesions. Soft tissues are unremarkable. IMPRESSION: Mild degenerative changes in the left shoulder. No acute displaced fractures are identified. Electronically Signed   By: Lucienne Capers M.D.   On: 11/25/2022 15:12    Procedures Procedures    Medications Ordered in ED Medications - No data to display  ED Course/ Medical Decision Making/ A&P                             Medical Decision Making Amount and/or Complexity of Data Reviewed Labs: ordered. Radiology: ordered.   This patient presents to the ED for concern of left shoulder pain and chest pain, this involves an extensive number of treatment options, and is a complaint that carries with it a high risk of complications and morbidity.  The differential diagnosis includes fracture, dislocation, soft tissue injury, ACS, PE, pneumonia, and others   Co morbidities that complicate the patient evaluation  History of hypertension, neuropathy   Additional history obtained:  Additional history obtained from family at bedside External records from outside source obtained and reviewed including primary care notes   Lab Tests:  I Ordered, and personally interpreted labs.  The pertinent results include: Potassium 5.3, sodium 133, troponin 7, unremarkable CBC   Imaging Studies ordered:  I ordered imaging studies including plain films of the left shoulder I independently visualized and interpreted imaging which  showed no fracture or dislocation I agree with the radiologist interpretation   Cardiac Monitoring: / EKG:  The patient was maintained on a cardiac monitor.  I personally viewed and interpreted the cardiac monitored which showed an underlying rhythm of: Sinus rhythm   Test / Admission - Considered:  No acute fracture or dislocation noted patient's left shoulder injury.  Patient has normal range of motion.  I see no indication at this time for splinting, sling, or further emergent intervention.  Plan to discharge patient home with prescription for Naprosyn and recommendations for follow-up as needed with orthopedics.  The patient states he already has an orthopedic provider and needs no referral information.  The patient had vague complaints of chest pain that he brought up secondary after discussing the imaging of his left shoulder.  His initial troponin was 70 with a nonischemic EKG.  He has no shortness of breath.  Lungs are clear to auscultation bilaterally.  Very low clinical suspicion of ACS, pulmonary embolism, dissection, pneumonia        Final Clinical Impression(s) / ED Diagnoses Final diagnoses:  Acute pain of left shoulder  Chest pain, unspecified type    Rx / DC Orders ED Discharge Orders          Ordered    naproxen (NAPROSYN) 500 MG tablet  2 times daily        11/25/22 1627              Ronny Bacon 11/25/22 1628    Leanord Asal K, DO 11/26/22 [phone]

## 2022-11-25 NOTE — ED Notes (Signed)
Pt ambulatory to waiting room. Pt verbalized understanding of discharge instructions.   

## 2022-11-25 NOTE — ED Triage Notes (Signed)
Pt to er, pt states that he hurt his shoulder about a week ago when he was bringing in the trash can, states that he fell onto his shoulder.  Pt states that he is here for L shoulder pain.  Pt moving all extremities.

## 2022-12-09 DIAGNOSIS — H2703 Aphakia, bilateral: Secondary | ICD-10-CM | POA: Diagnosis not present

## 2022-12-09 DIAGNOSIS — H02423 Myogenic ptosis of bilateral eyelids: Secondary | ICD-10-CM | POA: Diagnosis not present

## 2022-12-09 DIAGNOSIS — H402233 Chronic angle-closure glaucoma, bilateral, severe stage: Secondary | ICD-10-CM | POA: Diagnosis not present

## 2022-12-09 DIAGNOSIS — H04123 Dry eye syndrome of bilateral lacrimal glands: Secondary | ICD-10-CM | POA: Diagnosis not present

## 2022-12-16 ENCOUNTER — Encounter: Payer: Self-pay | Admitting: Family Medicine

## 2022-12-16 ENCOUNTER — Ambulatory Visit (INDEPENDENT_AMBULATORY_CARE_PROVIDER_SITE_OTHER): Payer: PPO | Admitting: Family Medicine

## 2022-12-16 VITALS — BP 130/79 | HR 79 | Temp 97.3°F | Ht 73.0 in | Wt 193.0 lb

## 2022-12-16 DIAGNOSIS — M25512 Pain in left shoulder: Secondary | ICD-10-CM | POA: Diagnosis not present

## 2022-12-16 DIAGNOSIS — H409 Unspecified glaucoma: Secondary | ICD-10-CM

## 2022-12-16 DIAGNOSIS — Q809 Congenital ichthyosis, unspecified: Secondary | ICD-10-CM

## 2022-12-16 DIAGNOSIS — I10 Essential (primary) hypertension: Secondary | ICD-10-CM | POA: Diagnosis not present

## 2022-12-16 MED ORDER — KETOROLAC TROMETHAMINE 60 MG/2ML IM SOLN
30.0000 mg | Freq: Once | INTRAMUSCULAR | Status: AC
  Administered 2022-12-16: 30 mg via INTRAMUSCULAR

## 2022-12-16 MED ORDER — KETOROLAC TROMETHAMINE 10 MG PO TABS: 10.0000 mg | ORAL_TABLET | Freq: Four times a day (QID) | ORAL | 0 refills | Status: DC | PRN

## 2022-12-16 NOTE — Assessment & Plan Note (Signed)
Continue daily lotion.  Recommended avoiding topical alcohol.

## 2022-12-16 NOTE — Addendum Note (Signed)
Addended by: Doran Clay A on: 12/16/2022 12:12 PM   Modules accepted: Orders

## 2022-12-16 NOTE — Patient Instructions (Addendum)
It was very nice to see you today!  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  We will give you an injection of Toradol today.  I will refer you to see the physical therapist.  Please follow-up with orthopedics if not improving.  Take care, Dr Jerline Pain  PLEASE NOTE:  If you had any lab tests, please let us know if you have not heard back within a few days. You may see your results on mychart before we have a chance to review them but we will give you a call once they are reviewed by [place].   If we ordered any referrals today, please let us know if you have not heard from their office within the next week.   If you had any urgent prescriptions sent in today, please check with the pharmacy within an hour of our visit to make sure the prescription was transmitted appropriately.   Please try these tips to maintain a healthy lifestyle:  Eat at least 3 REAL meals and 1-2 snacks per day.  Aim for no more than 5 hours between eating.  If you eat breakfast, please do so within one hour of getting up.   Each meal should contain half fruits/vegetables, one quarter protein, and one quarter carbs (no bigger than a computer mouse)  Cut down on sweet beverages. This includes juice, soda, and sweet tea.   Drink at least 1 glass of water with each meal and aim for at least 8 glasses per day  Exercise at least 150 minutes every week.

## 2022-12-16 NOTE — Assessment & Plan Note (Signed)
AT goal on lisinopril-HCTZ 20-12.5 half tablet once daily.

## 2022-12-16 NOTE — Progress Notes (Signed)
   Kirk [name] is a 82 y.o. male who presents today for an office visit.  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 Due to his worsening eyesight and history of glaucoma will avoid steroids at this time. We discussed alternative treatment options. Will give 30mg  of toradol today. Will start toradol by mouth tomorrow.  We discussed home exercises and handout was given.  We did discuss having him follow-up with orthopedics however they would like to try physical therapy first.  Will place referral today.  If still not improving he will need to go to orthopedics.  May benefit from intra-articular saline injection though as above we will avoid any steroid injections for now due to his glaucoma.  Chronic Problems Addressed Today: Essential hypertension AT goal on lisinopril-HCTZ 20-12.5 half tablet once daily.   Glaucoma Patient site is worsening though does have stable pressure.  He has been following with ophthalmology routinely for this.  Will be avoiding steroids as above.  Xeroderma Continue daily lotion.  Recommended avoiding topical alcohol.    Subjective:  HPI:  See A/P for status of chronic conditions.  Patient is here with left shoulder pain.  He went to the ED 3 weeks ago for this.  Two days before going to the ED, he was returning his trash can to his carport when he fell backwards into the storage building and landed on his left shoulder.  X-ray showed degenerative changes without any fracture or dislocation. He was given diclofenac and discharged home. He is not sure if the naproxen has given him much benefit.        Objective:  Physical Exam: BP 130/79   Pulse 79   Temp (!) 97.3 F (36.3 C) (Temporal)   Ht 6\' 1"  (1.854 m)   Wt 193 lb (87.5 kg)   SpO2 94%   BMI 25.46 kg/m   Gen: No acute distress, resting comfortably MSK: - Left Shoulder: No deformities.  Nontender to palpation.  Very limited range of motion.  Can only get  about 60 degrees of abduction and extension. Neuro: Grossly normal, moves all extremities Psych: Normal affect and thought content      Janae Bonser M. Jerline Pain, MD 12/16/2022 12:06 PM

## 2022-12-16 NOTE — Assessment & Plan Note (Signed)
Patient site is worsening though does have stable pressure.  He has been following with ophthalmology routinely for this.  Will be avoiding steroids as above.

## 2022-12-23 ENCOUNTER — Other Ambulatory Visit: Payer: Self-pay | Admitting: Family Medicine

## 2022-12-23 ENCOUNTER — Telehealth: Payer: Self-pay | Admitting: Family Medicine

## 2022-12-23 NOTE — Telephone Encounter (Signed)
Patient states he does not want to do medicare annual visit - patient states he was in to see dr Jerline Pain on 3/14- patient was advise OV with PCP is very different from medicare annual visit with Otila Kluver.

## 2022-12-27 ENCOUNTER — Telehealth: Payer: Self-pay | Admitting: Family Medicine

## 2022-12-27 NOTE — Telephone Encounter (Signed)
Copied from [place] [phone]. Topic: Medicare AWV >> Dec 27, 2022 12:06 PM Gillis Santa wrote: Reason for CRM: Called patient to schedule Medicare Annual Wellness Visit (AWV). Left message for patient to call back and schedule Medicare Annual Wellness Visit (AWV).  Last date of AWV: 05/21/2019  Please schedule an appointment at any time with Otila Kluver, [facility].  Please schedule AWVS with Otila Kluver, [place].  If any questions, please contact me at [phone].  Thank you ,  [facility] AWV TEAM Direct Dial [phone]

## 2023-01-04 ENCOUNTER — Ambulatory Visit (INDEPENDENT_AMBULATORY_CARE_PROVIDER_SITE_OTHER): Payer: PPO | Admitting: Physician Assistant

## 2023-01-04 ENCOUNTER — Encounter: Payer: Self-pay | Admitting: Physician Assistant

## 2023-01-04 VITALS — BP 124/70 | HR 83 | Temp 97.7°F | Ht 73.0 in | Wt 192.4 lb

## 2023-01-04 DIAGNOSIS — U071 COVID-19: Secondary | ICD-10-CM

## 2023-01-04 LAB — POC COVID19 BINAXNOW: SARS Coronavirus 2 Ag: POSITIVE — AB

## 2023-01-04 MED ORDER — NIRMATRELVIR/RITONAVIR (PAXLOVID)TABLET: 3.0000 | ORAL_TABLET | Freq: Two times a day (BID) | ORAL | 0 refills | Status: AC

## 2023-01-04 MED ORDER — COVID-19 TEST VI KIT: PACK | 0 refills | Status: DC

## 2023-01-04 NOTE — Patient Instructions (Signed)
It was great to see you!  Start paxlovid  For current/suspected COVID symptoms: - Please watch closely for new onset shortness of breath, worsening shortness of breath, dizziness, confusion or any worsening symptoms. If any of these occur, please contact us during business hours, and if after business hours, please seek urgent care or go to the closest emergency room.  -Consider purchasing a pulse oximeter. If your levels are 94% or below persistently, please seek care at the hospital.   -If you test positive for COVID, everyone, regardless of vaccination status, should stay home for 5 days since symptom onset (or if asymptomatic, on day of positive test.) If you have no symptoms or your symptoms are resolving after 5 days, you can leave your house. Continue to wear a mask around others for 5 additional days. If you have a fever, continue to stay home until your fever resolves without use of medication.  -Please inform any contacts of your positive result so they can appropriately quarantine/test.  -Push fluids and try to eat small, frequent meals with protein to maintain your stamina.   Take care,  [name] PA-C

## 2023-01-04 NOTE — Progress Notes (Signed)
Kirk [name] is a 82 y.o. male here for a new problem.  History of Present Illness:   Chief Complaint  Patient presents with   Cough    Pt c/o cough x 2 weeks, got worse over the weekend, expectorating clear, clear nasal drainage, chills.     HPI  Cough:  He tested positive for Covid in clinic today, this is his first time. Complains of a productive cough, fatigue, nasal drainage, clear mucus production, and chills that worsened on Saturday. He has been taking Tylenol, Advil, and Delysm to help symptoms.  Denies any diarrhea or sore throat.  He still has a good appetite.  He had the first two Covid vaccines but is not UTD on recent boosters.  No hx of COPD or any upper respiratory diseases.   Past Medical History:  Diagnosis Date   Cataract    Congenital glaucoma    "both eyes operated on when I was 60 months old"   Cough    Disturbance of skin sensation    Diverticulosis of colon 2014   Diverticulum of esophagus, acquired    Dysphagia, unspecified(787.20)    Encounter for long-term (current) use of other medications    Herpes zoster without mention of complication    Hyperglycemia    patient denies   Hyperlipidemia    Hypertension    Hypertrophy of prostate without urinary obstruction and other lower urinary tract symptoms (LUTS)    Lumbago    Nevus, non-neoplastic    Osteoarthrosis, unspecified whether generalized or localized, unspecified site    pt. denies   Other premature beats    Pain in limb    Plantar fascial fibromatosis    Special screening for malignant neoplasm of prostate    Unspecified glaucoma(365.9)    Unspecified pruritic disorder    Unspecified vitamin D deficiency    Urinary frequency    Zenker's diverticulum      Social History   Tobacco Use   Smoking status: Never   Smokeless tobacco: Never  Vaping Use   Vaping Use: Never used  Substance Use Topics   Alcohol use: No   Drug use: No    Past Surgical History:  Procedure Laterality  Date   CATARACT EXTRACTION BILATERAL W/ ANTERIOR VITRECTOMY Bilateral 1977   COLONOSCOPY  09/06/2013   [name]   DIRECT LARYNGOSCOPY  10/22/2015   Cervical esophagoscopy.  Endoscopic esophageal diverticulotomy.    [place]   congenital glaucoma   HERNIA REPAIR  1993   INCISIONAL HERNIA REPAIR  2008   LARYNGOSCOPY     with stapling of zenkers diverticulum   LUMBAR LAMINECTOMY/ DECOMPRESSION WITH MET-RX Left 08/16/2019   Procedure: Left Lumbar Two-Three Minimally Invasive Laminectomy and Microdiscectomy;  Surgeon: Judith Part, MD;  Location: [facility];  Service: Neurosurgery;  Laterality: Left;  Left Lumbar 2-3 Minimally invasive laminectomy and microdiscectomy    Family History  Problem Relation Age of Onset   Hypertension Mother    Dementia Mother    Kidney disease Father    CAD Father    Colon cancer Neg Hx    Throat cancer Neg Hx    Diabetes Neg Hx    Liver disease Neg Hx    Thyroid disease Neg Hx     Allergies  Allergen Reactions   Reserpine Other (See Comments)    Unknown reaction   Sulfa Antibiotics Other (See Comments)    Patient denies - Unknown reaction  Current Medications:   Current Outpatient Medications:    acetaminophen (TYLENOL) 325 MG tablet, Take 650 mg by mouth every 6 (six) hours as needed (pain). , Disp: , Rfl:    brimonidine (ALPHAGAN) 0.2 % ophthalmic solution, Place 1 drop into both eyes 2 (two) times daily. , Disp: , Rfl:    Cholecalciferol 25 MCG (1000 UT) tablet, Take 1,000 Units by mouth daily., Disp: , Rfl:    COVID-19 Test KIT, Use as directed, Disp: 2 kit, Rfl: 0   ketorolac (TORADOL) 10 MG tablet, TAKE 1 TABLET BY MOUTH EVERY 6 HOURS AS NEEDED, Disp: 20 tablet, Rfl: 0   lisinopril-hydrochlorothiazide (ZESTORETIC) 20-12.5 MG tablet, TAKE 1/2 TABLET BY MOUTH DAILY, Disp: 45 tablet, Rfl: 5   nirmatrelvir/ritonavir (PAXLOVID) 20 x 150 MG & 10 x 100MG  TABS, Take 3 tablets by mouth 2 (two) times  daily for 5 days. (Take nirmatrelvir 150 mg two tablets twice daily for 5 days and ritonavir 100 mg one tablet twice daily for 5 days), Disp: 30 tablet, Rfl: 0   timolol (TIMOPTIC) 0.5 % ophthalmic solution, Place 1 drop into the right eye daily. , Disp: , Rfl:    Review of Systems:   Review of Systems  Constitutional:  Positive for chills and malaise/fatigue.  HENT:         (+) nasal discharge  Respiratory:  Positive for cough (productive) and sputum production (clear).     Vitals:   Vitals:   01/04/23 1455  BP: 124/70  Pulse: 83  Temp: 97.7 F (36.5 C)  TempSrc: Temporal  SpO2: 94%  Weight: 192 lb 6.1 oz (87.3 kg)  Height: 6\' 1"  (1.854 m)     Body mass index is 25.38 kg/m.  Physical Exam:   Physical Exam Vitals and nursing note reviewed.  Constitutional:      General: He is not in acute distress.    Appearance: He is well-developed. He is not ill-appearing or toxic-appearing.  Cardiovascular:     Rate and Rhythm: Normal rate and regular rhythm.     Pulses: Normal pulses.     Heart sounds: Normal heart sounds, S1 normal and S2 normal.  Pulmonary:     Effort: Pulmonary effort is normal.     Breath sounds: Normal breath sounds.  Skin:    General: Skin is warm and dry.  Neurological:     Mental Status: He is alert.     GCS: GCS eye subscore is 4. GCS verbal subscore is 5. GCS motor subscore is 6.  Psychiatric:        Speech: Speech normal.        Behavior: Behavior normal. Behavior is cooperative.    Results for orders placed or performed in visit on 01/04/23  POC COVID-19  Result Value Ref Range   SARS Coronavirus 2 Ag Positive (A) Negative     Assessment and Plan:   COVID-19 No red flags Appears overall stable and vitals are overall close to his baseline Discussed anti-virals, he is agreeable to Paxlovid Recommend follow-up if any new/worsening sx AVS with ER precautions advised  I,[name],acting as a scribe for [employer], PA.,have  documented all relevant documentation on the behalf of [name], PA,as directed by  [name], PA while in the presence of [name], [name].  I, [name], [name], have reviewed all documentation for this visit. The documentation on 01/04/23 for the exam, diagnosis, procedures, and orders are all accurate and complete.   [name], PA-C

## 2023-01-12 ENCOUNTER — Telehealth: Payer: Self-pay | Admitting: Family Medicine

## 2023-01-12 NOTE — Telephone Encounter (Signed)
Contacted Kirk [name] to schedule their annual wellness visit. Call back at later date: 02/01/2023  [name] [facility] AWV TEAM Direct Dial [phone]

## 2023-01-31 ENCOUNTER — Telehealth: Payer: Self-pay | Admitting: Family Medicine

## 2023-01-31 NOTE — Telephone Encounter (Signed)
Pts wife states his lips have been twitching. No pain but very peculiar. She would like a call back.

## 2023-02-01 NOTE — Telephone Encounter (Signed)
Spoke with patient, stated hi had a twitching on his mouth yesterday and this morning  No HA no weakness on his extremity no problems with his speech  Patient denied an appointment  Stated has PT on Thursday and will wait till then  Notified our triage nurse will give him a call

## 2023-02-02 ENCOUNTER — Telehealth: Payer: Self-pay | Admitting: Family Medicine

## 2023-02-02 NOTE — Telephone Encounter (Signed)
Patient returned call. Would like callback when able.

## 2023-02-02 NOTE — Telephone Encounter (Signed)
Copied from CRM [id number]. Topic: Medicare AWV >> Feb 02, 2023 12:52 PM Gwenith Spitz wrote: Reason for CRM: Called patient to schedule Medicare Annual Wellness Visit (AWV). Left message for patient to call back and schedule Medicare Annual Wellness Visit (AWV).  Last date of AWV: 05/21/2019  Please schedule an appointment at any time with Inetta Fermo, [facility]. Please schedule AWVS with Inetta Fermo, [facility].  If any questions, please contact me at [phone].  Thank you ,  [name] [facility] AWV TEAM Direct Dial [phone]

## 2023-02-03 ENCOUNTER — Ambulatory Visit: Payer: PPO | Admitting: Physical Therapy

## 2023-02-03 ENCOUNTER — Telehealth: Payer: Self-pay | Admitting: Family Medicine

## 2023-02-03 DIAGNOSIS — R2689 Other abnormalities of gait and mobility: Secondary | ICD-10-CM

## 2023-02-03 DIAGNOSIS — M6281 Muscle weakness (generalized): Secondary | ICD-10-CM

## 2023-02-03 DIAGNOSIS — M25512 Pain in left shoulder: Secondary | ICD-10-CM

## 2023-02-03 NOTE — Therapy (Unsigned)
OUTPATIENT PHYSICAL THERAPY  EVALUATION   Patient Name: Kirk [name] MRN: [id number] DOB:June 10, 1941, 82 y.o., male Today's Date: 02/03/2023  END OF SESSION:  PT End of Session - 02/08/23 1352     Visit Number 1    Number of Visits 16    Date for PT Re-Evaluation 03/31/23    Authorization Type HTA    PT Start Time 1433    PT Stop Time 1515    PT Time Calculation (min) 42 min    Activity Tolerance Patient tolerated treatment well    Behavior During Therapy WFL for tasks assessed/performed             Past Medical History:  Diagnosis Date   Cataract    Congenital glaucoma    "both eyes operated on when I was 77 months old"   Cough    Disturbance of skin sensation    Diverticulosis of colon 2014   Diverticulum of esophagus, acquired    Dysphagia, unspecified(787.20)    Encounter for long-term (current) use of other medications    Herpes zoster without mention of complication    Hyperglycemia    patient denies   Hyperlipidemia    Hypertension    Hypertrophy of prostate without urinary obstruction and other lower urinary tract symptoms (LUTS)    Lumbago    Nevus, non-neoplastic    Osteoarthrosis, unspecified whether generalized or localized, unspecified site    pt. denies   Other premature beats    Pain in limb    Plantar fascial fibromatosis    Special screening for malignant neoplasm of prostate    Unspecified glaucoma(365.9)    Unspecified pruritic disorder    Unspecified vitamin D deficiency    Urinary frequency    Zenker's diverticulum    Past Surgical History:  Procedure Laterality Date   CATARACT EXTRACTION BILATERAL W/ ANTERIOR VITRECTOMY Bilateral 1977   COLONOSCOPY  09/06/2013   Marina Goodell   DIRECT LARYNGOSCOPY  10/22/2015   Cervical esophagoscopy.  Endoscopic esophageal diverticulotomy.    FLEXIBLE SIGMOIDOSCOPY  1999   GLAUCOMA SURGERY  1945   congenital glaucoma   HERNIA REPAIR  1993   INCISIONAL HERNIA REPAIR  2008   LARYNGOSCOPY     with  stapling of zenkers diverticulum   LUMBAR LAMINECTOMY/ DECOMPRESSION WITH MET-RX Left 08/16/2019   Procedure: Left Lumbar Two-Three Minimally Invasive Laminectomy and Microdiscectomy;  Surgeon: Jadene Pierini, MD;  Location: [facility] OR;  Service: Neurosurgery;  Laterality: Left;  Left Lumbar 2-3 Minimally invasive laminectomy and microdiscectomy   Patient Active Problem List   Diagnosis Date Noted   Central stenosis of spinal canal 05/26/2021   Actinic keratosis 06/16/2020   Bilateral leg weakness 06/16/2020   Onychomycosis 12/13/2019   Herniated lumbar intervertebral disc 08/16/2019   Degenerative lumbar spinal stenosis 02/14/2019   TSH elevation 02/25/2016   Orthostatic hypotension 02/17/2016   Syncope 01/07/2016   Esophageal diverticulum, acquired 10/22/2015   Xeroderma 08/20/2015   Bifascicular block 08/20/2015   Neuropathy (HCC) 02/05/2015   Diverticulosis of colon    Vitamin D deficiency    Hyperglycemia    Dysphagia    Diverticulum of esophagus, acquired    Hyperlipidemia    Glaucoma    BPH (benign prostatic hyperplasia)    Essential hypertension 02/01/2013   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    PCP: Jacquiline Doe  REFERRING PROVIDER: Jacquiline Doe  REFERRING DIAG: Acute pain of L shoulder   THERAPY DIAG:  Acute pain of left shoulder  Muscle weakness (generalized)  Other abnormalities of gait and mobility  Rationale for Evaluation and Treatment: Rehabilitation  ONSET DATE: 1 mo ago  SUBJECTIVE:                                                                                                                                                                                      SUBJECTIVE STATEMENT: Pt had Fall about a month ago, outside, when he was bringing garbage can back to the house with his walker. Fell back against a wall, onto back his L shoulder. States pain pill not helping much. He is Sleeping ok., not waking up with pain. States "can't lift his arm up". He  states no other falls recently. He is using walker at all times due to weakness in his legs. He also states increasing difficulty with his eyes/vision.  Hand dominance: Right   PERTINENT HISTORY:  Lumbar Stenosis, poor eyesight, falls  *eyes sensitive to light when laying flat on table *    PAIN:  Are you having pain? Yes: NPRS scale: 5/10 Pain location: L shoulder Pain description: sore/weak Aggravating factors: elevation, reaching, activity  Relieving factors: none stated    PRECAUTIONS: Fall   WEIGHT BEARING RESTRICTIONS: No  FALLS:  Has patient fallen in last 6 months? Yes. Number of falls 1 outside   PLOF: Independent  PATIENT GOALS: decreased pain in L shoulder   NEXT MD VISIT:   OBJECTIVE:   DIAGNOSTIC FINDINGS:    PATIENT SURVEYS :    COGNITION: Overall cognitive status: Within functional limits for tasks assessed     SENSATION:  POSTURE:  fwd flexed posture, increased with walking with walker.   UPPER EXTREMITY ROM:   Active /Passive ROM Right eval Left eval  Shoulder flexion 115/ 65/140  Shoulder extension    Shoulder abduction  60/120  Shoulder adduction    Shoulder internal rotation  wfl  Shoulder external rotation  /60  Elbow flexion    Elbow extension    Wrist flexion    Wrist extension    Wrist ulnar deviation    Wrist radial deviation    Wrist pronation    Wrist supination    (Blank rows = not tested)  UPPER EXTREMITY MMT:  MMT Right eval Left eval  Shoulder flexion 4 3-  Shoulder extension    Shoulder abduction 4 3-  Shoulder adduction    Shoulder internal rotation  4  Shoulder external rotation  3-  Middle trapezius    Lower trapezius    Elbow flexion    Elbow extension    Wrist flexion    Wrist extension    Wrist ulnar deviation    Wrist radial  deviation    Wrist pronation    Wrist supination    Grip strength (lbs)    (Blank rows = not tested)  SHOULDER SPECIAL TESTS: Significant weakness with ER and with  elevation, shoulder hiking present   JOINT MOBILITY TESTING:  Near full PROM, mild joint hypomobility  PALPATION:  Minimal pain to palpate shoulder  Gait: slow speed, reliant on RW, increased trunk flexion, partially due to walker being too low.   Balance and gait tests: TBD.     TODAY'S TREATMENT:                                                                                                                                         DATE:   PATIENT EDUCATION: Education details: PT POC, Exam findings, HEP, education on optimal position for shoulder for elevation and use, discussed likelihood of rotator cuff pathology.  Person educated: Patient,  discussed pulley and HEP with wife.  Education method: Explanation, Demonstration, Tactile cues, Verbal cues, and Handouts Education comprehension: verbalized understanding, returned demonstration, verbal cues required, tactile cues required, and needs further education  HOME EXERCISE PROGRAM: Access Code: [email or web address] URL: [email or web address] Date: 02/03/2023 Prepared by: Sedalia Muta  Exercises - Supine Shoulder Flexion Extension AAROM with Dowel  - 1 x daily - 1 sets - 10 reps - Seated Shoulder Flexion AAROM with Pulley Behind  - 1 x daily - 2 sets - 10 reps  ASSESSMENT:  CLINICAL IMPRESSION: Patient presents with primary complaint of increased weakness and pain in L shoulder. He has significant muscle weakness, with decreased ability for AROM and elevation. There is likely rotator cuff pathology due to weakness, presentation and arm hiking. It is not likely that he will regain full ROM/strength, but will benefit from improving pain and function to max ability. He also has ongoing deficits with balance and gait, and has had recent fall. He has been seen in PT for this in the past, and will benefit from review of LE strength and HEP, as well as gait safety for decreasing falls. He has very poor eyesight which also is effecting  safety. Pt to benefit from skilled PT to improve deficits and pain.    OBJECTIVE IMPAIRMENTS: Abnormal gait, decreased activity tolerance, decreased balance, decreased coordination, decreased mobility, difficulty walking, decreased ROM, decreased strength, decreased safety awareness, impaired UE functional use, and pain.   ACTIVITY LIMITATIONS: carrying, lifting, standing, stairs, transfers, reach over head, and locomotion level  PARTICIPATION LIMITATIONS: meal prep, cleaning, and community activity- some limitation due to eyesight  PERSONAL FACTORS: 1-2 comorbidities: stenosis causing weakness in LEs, eyesight, likely RTC pathology  are also affecting patient's functional outcome.   REHAB POTENTIAL: Good  CLINICAL DECISION MAKING: Evolving/moderate complexity  EVALUATION COMPLEXITY: Moderate  GOALS: Goals reviewed with patient? Yes  SHORT TERM GOALS: Target date: 02/17/2023   Pt to be independent with initial HEP  Goal status: INITIAL  LONG TERM GOALS: Target date: 04/01/2023   Pt to be independent with final HEP for Shoulder, and LE strength  Goal status: INITIAL  2.  Pt to demo ability for shoulder elevation to at least 90 deg, with pain 0-2/10, to improve ability for functional use and ADLs .    Goal status: INITIAL  3.  Balance/gait goal TBD  Goal status: INITIAL     PLAN: PT FREQUENCY: 1-2x/week  PT DURATION: 8 weeks  PLANNED INTERVENTIONS: Therapeutic exercises, Therapeutic activity, Neuromuscular re-education, Patient/Family education, Self Care, Joint mobilization, Joint manipulation, Stair training, Orthotic/Fit training, DME instructions, Aquatic Therapy, Dry Needling, Electrical stimulation, Cryotherapy, Moist heat, Taping, Ultrasound, Ionotophoresis 4mg /ml Dexamethasone, Manual therapy,  Vasopneumatic device, Traction, Spinal manipulation, Spinal mobilization,Balance training, Gait training,   PLAN FOR NEXT SESSION:  AAROM, AROM or strength for  shoulder as able,  continue to assess balance, gait , TUG, review LE HEP when able   Sedalia Muta, PT, DPT 1:52 PM  02/08/23

## 2023-02-03 NOTE — Telephone Encounter (Signed)
Contacted [facility] to schedule their annual wellness visit. Appointment made for 02/08/2023.  [name] [facility] AWV TEAM Direct Dial [phone]

## 2023-02-04 ENCOUNTER — Encounter: Payer: Self-pay | Admitting: Physical Therapy

## 2023-02-08 ENCOUNTER — Encounter: Payer: Self-pay | Admitting: Physical Therapy

## 2023-02-08 ENCOUNTER — Ambulatory Visit (INDEPENDENT_AMBULATORY_CARE_PROVIDER_SITE_OTHER): Payer: PPO

## 2023-02-08 VITALS — Wt 192.0 lb

## 2023-02-08 DIAGNOSIS — Z0000 Encounter for general adult medical examination without abnormal findings: Secondary | ICD-10-CM

## 2023-02-08 NOTE — Progress Notes (Signed)
I connected with  [facility] on 02/08/23 by a audio enabled telemedicine application and verified that I am speaking with the correct person using two identifiers.  Patient Location: Home  Provider Location: Office/Clinic  I discussed the limitations of evaluation and management by telemedicine. The patient expressed understanding and agreed to proceed.   Subjective:   Kirk [name] is a 82 y.o. male who presents for Medicare Annual/Subsequent preventive examination.  Review of Systems     Cardiac Risk Factors include: advanced age (>109men, >74 women);male gender;dyslipidemia;hypertension     Objective:    Today's Vitals   02/08/23 0805  Weight: 192 lb (87.1 kg)   Body mass index is 25.33 kg/m.     02/08/2023    8:10 AM 02/04/2023    1:54 PM 11/25/2022    2:31 PM 11/09/2021    1:04 PM 08/24/2021    9:15 AM 11/30/2020    2:41 PM 07/31/2020    8:14 AM  Advanced Directives  Does Patient Have a Medical Advance Directive? No No Yes Yes Yes No No  Type of Best boy of [place];Living will Healthcare Power of [place];Living will     Copy of Healthcare Power of Attorney in Chart?    No - copy requested     Would patient like information on creating a medical advance directive? No - Patient declined No - Patient declined    No - Patient declined No - Patient declined    Current Medications (verified) Outpatient Encounter Medications as of 02/08/2023  Medication Sig   acetaminophen (TYLENOL) 325 MG tablet Take 650 mg by mouth every 6 (six) hours as needed (pain).    brimonidine (ALPHAGAN) 0.2 % ophthalmic solution Place 1 drop into both eyes 2 (two) times daily.    Cholecalciferol 25 MCG (1000 UT) tablet Take 1,000 Units by mouth daily.   lisinopril-hydrochlorothiazide (ZESTORETIC) 20-12.5 MG tablet TAKE 1/2 TABLET BY MOUTH DAILY   timolol (TIMOPTIC) 0.5 % ophthalmic solution Place 1 drop into the right eye daily.    [DISCONTINUED] COVID-19 Test KIT Use as  directed   [DISCONTINUED] ketorolac (TORADOL) 10 MG tablet TAKE 1 TABLET BY MOUTH EVERY 6 HOURS AS NEEDED   No facility-administered encounter medications on file as of 02/08/2023.    Allergies (verified) Reserpine and Sulfa antibiotics   History: Past Medical History:  Diagnosis Date   Cataract    Congenital glaucoma    "both eyes operated on when I was 56 months old"   Cough    Disturbance of skin sensation    Diverticulosis of colon 2014   Diverticulum of esophagus, acquired    Dysphagia, unspecified(787.20)    Encounter for long-term (current) use of other medications    Herpes zoster without mention of complication    Hyperglycemia    patient denies   Hyperlipidemia    Hypertension    Hypertrophy of prostate without urinary obstruction and other lower urinary tract symptoms (LUTS)    Lumbago    Nevus, non-neoplastic    Osteoarthrosis, unspecified whether generalized or localized, unspecified site    pt. denies   Other premature beats    Pain in limb    Plantar fascial fibromatosis    Special screening for malignant neoplasm of prostate    Unspecified glaucoma(365.9)    Unspecified pruritic disorder    Unspecified vitamin D deficiency    Urinary frequency    Zenker's diverticulum    Past Surgical History:  Procedure Laterality Date  CATARACT EXTRACTION BILATERAL W/ ANTERIOR VITRECTOMY Bilateral 1977   COLONOSCOPY  09/06/2013   Marina Goodell   DIRECT LARYNGOSCOPY  10/22/2015   Cervical esophagoscopy.  Endoscopic esophageal diverticulotomy.    FLEXIBLE SIGMOIDOSCOPY  1999   GLAUCOMA SURGERY  1945   congenital glaucoma   HERNIA REPAIR  1993   INCISIONAL HERNIA REPAIR  2008   LARYNGOSCOPY     with stapling of zenkers diverticulum   LUMBAR LAMINECTOMY/ DECOMPRESSION WITH MET-RX Left 08/16/2019   Procedure: Left Lumbar Two-Three Minimally Invasive Laminectomy and Microdiscectomy;  Surgeon: Jadene Pierini, MD;  Location: [facility] OR;  Service: Neurosurgery;  Laterality: Left;   Left Lumbar 2-3 Minimally invasive laminectomy and microdiscectomy   Family History  Problem Relation Age of Onset   Hypertension Mother    Dementia Mother    Kidney disease Father    CAD Father    Colon cancer Neg Hx    Throat cancer Neg Hx    Diabetes Neg Hx    Liver disease Neg Hx    Thyroid disease Neg Hx    Social History   Socioeconomic History   Marital status: Married    Spouse name: Not on file   Number of children: Not on file   Years of education: Not on file   Highest education level: Not on file  Occupational History   Not on file  Tobacco Use   Smoking status: Never   Smokeless tobacco: Never  Vaping Use   Vaping Use: Never used  Substance and Sexual Activity   Alcohol use: No   Drug use: No   Sexual activity: Not on file  Other Topics Concern   Not on file  Social History Narrative   Married   Lives at home with wife Britta Mccreedy)   No regular exercise, but does his own yard work and household chores.   Never smoked   Alcohol none   Social Determinants of Health   Financial Resource Strain: Low Risk  (02/08/2023)   Overall Financial Resource Strain (CARDIA)    Difficulty of Paying Living Expenses: Not hard at all  Food Insecurity: No Food Insecurity (02/08/2023)   Hunger Vital Sign    Worried About Running Out of Food in the Last Year: Never true    Ran Out of Food in the Last Year: Never true  Transportation Needs: No Transportation Needs (02/08/2023)   PRAPARE - Administrator, Civil Service (Medical): No    Lack of Transportation (Non-Medical): No  Physical Activity: Insufficiently Active (02/08/2023)   Exercise Vital Sign    Days of Exercise per Week: 1 day    Minutes of Exercise per Session: 50 min  Stress: No Stress Concern Present (02/08/2023)   [employer] of Occupational Health - Occupational Stress Questionnaire    Feeling of Stress : Not at all  Social Connections: Moderately Integrated (02/08/2023)   Social Connection and  Isolation Panel [NHANES]    Frequency of Communication with Friends and Family: More than three times a week    Frequency of Social Gatherings with Friends and Family: More than three times a week    Attends Religious Services: 1 to 4 times per year    Active Member of [employer] or Organizations: No    Attends Banker Meetings: Never    Marital Status: Married    Tobacco Counseling Counseling given: Not Answered   Clinical Intake:  Pre-visit preparation completed: Yes  Pain : No/denies pain     BMI -  recorded: 25.33 Nutritional Status: BMI 25 -29 Overweight Nutritional Risks: None Diabetes: No  How often do you need to have someone help you when you read instructions, pamphlets, or other written materials from your doctor or pharmacy?: 1 - Never  Diabetic?no  Interpreter Needed?: No  Information entered by :: Lanier Ensign, LPN   Activities of Daily Living    02/08/2023    8:13 AM  In your present state of health, do you have any difficulty performing the following activities:  Hearing? 0  Vision? 0  Difficulty concentrating or making decisions? 0  Walking or climbing stairs? 1  Comment use walker  Dressing or bathing? 0  Doing errands, shopping? 0  Preparing Food and eating ? Y  Comment wife  Using the Toilet? N  In the past six months, have you accidently leaked urine? N  Do you have problems with loss of bowel control? N  Managing your Medications? N  Managing your Finances? N  Housekeeping or managing your Housekeeping? N    Patient Care Team: Ardith Dark, MD as PCP - General (Family Medicine) Judi Saa, DO as Consulting Physician (Family Medicine) [place], Peter M, MD as Consulting Physician (Cardiology)  Indicate any recent Medical Services you may have received from other than [facility] providers in the past year (date may be approximate).     Assessment:   This is a routine wellness examination for Zein.  Hearing/Vision  screen Hearing Screening - Comments:: Pt denies any hearing issues  Vision Screening - Comments:: Pt follows up with [facility] for annual eye exams   Dietary issues and exercise activities discussed: Current Exercise Habits: Home exercise routine, Type of exercise: stretching;Other - see comments (PT), Time (Minutes): 50, Frequency (Times/Week): 1, Weekly Exercise (Minutes/Week): 50   Goals Addressed             This Visit's Progress    Patient Stated       Continue with PT        Depression Screen    02/08/2023    8:09 AM 12/16/2022   11:25 AM 04/28/2021    1:02 PM 11/13/2018    3:41 PM 10/05/2017    9:14 AM 12/22/2016    1:23 PM 07/13/2016    2:12 PM  PHQ 2/9 Scores  PHQ - 2 Score 0 0 0 0 0 0 0    Fall Risk    02/08/2023    8:12 AM 12/16/2022   11:24 AM 04/28/2021    1:02 PM 05/21/2019    1:37 PM 11/13/2018    2:44 PM  Fall Risk   Falls in the past year? 1 0 0 1 0  Number falls in past yr: 1 0 0 1   Injury with Fall? 1 1 0 0   Comment left shoulder      Risk for fall due to : Impaired balance/gait;Impaired mobility;Impaired vision Impaired mobility;History of fall(s)  History of fall(s);Impaired vision;Impaired mobility   Follow up Falls prevention discussed   Education provided;Falls prevention discussed     FALL RISK PREVENTION PERTAINING TO THE HOME:  Any stairs in or around the home? Yes  If so, are there any without handrails? No  Home free of loose throw rugs in walkways, pet beds, electrical cords, etc? Yes  Adequate lighting in your home to reduce risk of falls? Yes   ASSISTIVE DEVICES UTILIZED TO PREVENT FALLS:  Life alert? No  Use of a cane, walker or w/c? Yes  Grab  bars in the bathroom? No  Shower chair or bench in shower? Yes  Elevated toilet seat or a handicapped toilet? No   TIMED UP AND GO:  Was the test performed? No .     Cognitive Function:    12/22/2016    1:38 PM 08/20/2015    3:57 PM  MMSE - Mini Mental State Exam  Orientation to  time 5 4  Orientation to Place 5 5  Registration 3 3  Attention/ Calculation 5 5  Recall 3 1  Language- name 2 objects 2 2  Language- repeat 1 1  Language- follow 3 step command 3 3  Language- read & follow direction 1 1  Write a sentence 1 1  Copy design 1 1  Total score 30 27        02/08/2023    8:14 AM  6CIT Screen  What Year? 0 points  What month? 0 points  What time? 0 points  Count back from 20 0 points  Months in reverse 0 points  Repeat phrase 0 points  Total Score 0 points    Immunizations Immunization History  Administered Date(s) Administered   IPV 01/05/1956, 03/03/1956, 10/10/1956, 12/22/1960   Influenza, High Dose Seasonal PF 07/21/2017   Influenza,inj,Quad PF,6+ Mos 07/13/2016   PFIZER(Purple Top)SARS-COV-2 Vaccination 11/09/2019, 01/21/2020   Pneumococcal Conjugate-13 01/12/2012   Smallpox 12/15/1960   Td 03/05/2011   Typhoid Live 12/15/1960, 12/22/1960, 12/31/1960    TDAP status: Due, Education has been provided regarding the importance of this vaccine. Advised may receive this vaccine at local pharmacy or Health Dept. Aware to provide a copy of the vaccination record if obtained from local pharmacy or Health Dept. Verbalized acceptance and understanding.  Flu Vaccine status: Declined, Education has been provided regarding the importance of this vaccine but patient still declined. Advised may receive this vaccine at local pharmacy or Health Dept. Aware to provide a copy of the vaccination record if obtained from local pharmacy or Health Dept. Verbalized acceptance and understanding.  Pneumococcal vaccine status: Due, Education has been provided regarding the importance of this vaccine. Advised may receive this vaccine at local pharmacy or Health Dept. Aware to provide a copy of the vaccination record if obtained from local pharmacy or Health Dept. Verbalized acceptance and understanding.  Covid-19 vaccine status: Completed vaccines  Qualifies for  Shingles Vaccine? Yes   Zostavax completed No   Shingrix Completed?: No.    Education has been provided regarding the importance of this vaccine. Patient has been advised to call insurance company to determine out of pocket expense if they have not yet received this vaccine. Advised may also receive vaccine at local pharmacy or Health Dept. Verbalized acceptance and understanding.  Screening Tests Health Maintenance  Topic Date Due   DTaP/Tdap/Td (2 - Tdap) 03/04/2021   Zoster Vaccines- Shingrix (1 of 2) 03/18/2023 (Originally 12/21/1960)   Pneumonia Vaccine 51+ Years old (2 of 2 - PPSV23 or PCV20) 12/16/2023 (Originally 01/11/2013)   COVID-19 Vaccine (3 - Pfizer risk series) 01/04/2024 (Originally 02/18/2020)   INFLUENZA VACCINE  05/05/2023   Medicare Annual Wellness (AWV)  02/08/2024   HPV VACCINES  Aged Out    Health Maintenance  Health Maintenance Due  Topic Date Due   DTaP/Tdap/Td (2 - Tdap) 03/04/2021    Colorectal cancer screening: No longer required.    Additional Screening:   Vision Screening: Recommended annual ophthalmology exams for early detection of glaucoma and other disorders of the eye. Is the patient up to date  with their annual eye exam?  Yes  Who is the provider or what is the name of the office in which the patient attends annual eye exams? Dr Sherrine Maples If pt is not established with a provider, would they like to be referred to a provider to establish care? No .   Dental Screening: Recommended annual dental exams for proper oral hygiene  Community Resource Referral / Chronic Care Management: CRR required this visit?  No   CCM required this visit?  No      Plan:     I have personally reviewed and noted the following in the patient's chart:   Medical and social history Use of alcohol, tobacco or illicit drugs  Current medications and supplements including opioid prescriptions. Patient is not currently taking opioid prescriptions. Functional ability and  status Nutritional status Physical activity Advanced directives List of other physicians Hospitalizations, surgeries, and ER visits in previous 12 months Vitals Screenings to include cognitive, depression, and falls Referrals and appointments  In addition, I have reviewed and discussed with patient certain preventive protocols, quality metrics, and best practice recommendations. A written personalized care plan for preventive services as well as general preventive health recommendations were provided to patient.     Marzella Schlein, LPN   10/09/1094   Nurse Notes: none

## 2023-02-08 NOTE — Patient Instructions (Signed)
Mr. Kirk [name] , Thank you for taking time to come for your Medicare Wellness Visit. I appreciate your ongoing commitment to your health goals. Please review the following plan we discussed and let me know if I can assist you in the future.   These are the goals we discussed:  Goals       Patient Stated (pt-stated)      Regain strength and mobility  Increase appetite       Patient Stated      Continue with PT         This is a list of the screening recommended for you and due dates:  Health Maintenance  Topic Date Due   DTaP/Tdap/Td vaccine (2 - Tdap) 03/04/2021   Zoster (Shingles) Vaccine (1 of 2) 03/18/2023*   Pneumonia Vaccine (2 of 2 - PPSV23 or PCV20) 12/16/2023*   COVID-19 Vaccine (3 - Pfizer risk series) 01/04/2024*   Flu Shot  05/05/2023   Medicare Annual Wellness Visit  02/08/2024   HPV Vaccine  Aged Out  *Topic was postponed. The date shown is not the original due date.    Advanced directives: Advance directive discussed with you today. Even though you declined this today please call our office should you change your mind and we can give you the proper paperwork for you to fill out.   Conditions/risks identified: continue with PT and get off walker   Next appointment: Follow up in one year for your annual wellness visit.   Preventive Care 13 Years and Older, Male  Preventive care refers to lifestyle choices and visits with your health care provider that can promote health and wellness. What does preventive care include? A yearly physical exam. This is also called an annual well check. Dental exams once or twice a year. Routine eye exams. Ask your health care provider how often you should have your eyes checked. Personal lifestyle choices, including: Daily care of your teeth and gums. Regular physical activity. Eating a healthy diet. Avoiding tobacco and drug use. Limiting alcohol use. Practicing safe sex. Taking low doses of aspirin every day. Taking vitamin  and mineral supplements as recommended by your health care provider. What happens during an annual well check? The services and screenings done by your health care provider during your annual well check will depend on your age, overall health, lifestyle risk factors, and family history of disease. Counseling  Your health care provider may ask you questions about your: Alcohol use. Tobacco use. Drug use. Emotional well-being. Home and relationship well-being. Sexual activity. Eating habits. History of falls. Memory and ability to understand (cognition). Work and work Astronomer. Screening  You may have the following tests or measurements: Height, weight, and BMI. Blood pressure. Lipid and cholesterol levels. These may be checked every 5 years, or more frequently if you are over 62 years old. Skin check. Lung cancer screening. You may have this screening every year starting at age 15 if you have a 30-pack-year history of smoking and currently smoke or have quit within the past 15 years. Fecal occult blood test (FOBT) of the stool. You may have this test every year starting at age 75. Flexible sigmoidoscopy or colonoscopy. You may have a sigmoidoscopy every 5 years or a colonoscopy every 10 years starting at age 93. Prostate cancer screening. Recommendations will vary depending on your family history and other risks. Hepatitis C blood test. Hepatitis B blood test. Sexually transmitted disease (STD) testing. Diabetes screening. This is done by checking your blood  sugar (glucose) after you have not eaten for a while (fasting). You may have this done every 1-3 years. Abdominal aortic aneurysm (AAA) screening. You may need this if you are a current or former smoker. Osteoporosis. You may be screened starting at age 29 if you are at high risk. Talk with your health care provider about your test results, treatment options, and if necessary, the need for more tests. Vaccines  Your health care  provider may recommend certain vaccines, such as: Influenza vaccine. This is recommended every year. Tetanus, diphtheria, and acellular pertussis (Tdap, Td) vaccine. You may need a Td booster every 10 years. Zoster vaccine. You may need this after age 23. Pneumococcal 13-valent conjugate (PCV13) vaccine. One dose is recommended after age 44. Pneumococcal polysaccharide (PPSV23) vaccine. One dose is recommended after age 45. Talk to your health care provider about which screenings and vaccines you need and how often you need them. This information is not intended to replace advice given to you by your health care provider. Make sure you discuss any questions you have with your health care provider. Document Released: 10/17/2015 Document Revised: 06/09/2016 Document Reviewed: 07/22/2015 Elsevier Interactive Patient Education  2017 [employer] Prevention in the Home Falls can cause injuries. They can happen to people of all ages. There are many things you can do to make your home safe and to help prevent falls. What can I do on the outside of my home? Regularly fix the edges of walkways and driveways and fix any cracks. Remove anything that might make you trip as you walk through a door, such as a raised step or threshold. Trim any bushes or trees on the path to your home. Use bright outdoor lighting. Clear any walking paths of anything that might make someone trip, such as rocks or tools. Regularly check to see if handrails are loose or broken. Make sure that both sides of any steps have handrails. Any raised decks and porches should have guardrails on the edges. Have any leaves, snow, or ice cleared regularly. Use sand or salt on walking paths during winter. Clean up any spills in your garage right away. This includes oil or grease spills. What can I do in the bathroom? Use night lights. Install grab bars by the toilet and in the tub and shower. Do not use towel bars as grab  bars. Use non-skid mats or decals in the tub or shower. If you need to sit down in the shower, use a plastic, non-slip stool. Keep the floor dry. Clean up any water that spills on the floor as soon as it happens. Remove soap buildup in the tub or shower regularly. Attach bath mats securely with double-sided non-slip rug tape. Do not have throw rugs and other things on the floor that can make you trip. What can I do in the bedroom? Use night lights. Make sure that you have a light by your bed that is easy to reach. Do not use any sheets or blankets that are too big for your bed. They should not hang down onto the floor. Have a firm chair that has side arms. You can use this for support while you get dressed. Do not have throw rugs and other things on the floor that can make you trip. What can I do in the kitchen? Clean up any spills right away. Avoid walking on wet floors. Keep items that you use a lot in easy-to-reach places. If you need to reach something above you,  use a strong step stool that has a grab bar. Keep electrical cords out of the way. Do not use floor polish or wax that makes floors slippery. If you must use wax, use non-skid floor wax. Do not have throw rugs and other things on the floor that can make you trip. What can I do with my stairs? Do not leave any items on the stairs. Make sure that there are handrails on both sides of the stairs and use them. Fix handrails that are broken or loose. Make sure that handrails are as long as the stairways. Check any carpeting to make sure that it is firmly attached to the stairs. Fix any carpet that is loose or worn. Avoid having throw rugs at the top or bottom of the stairs. If you do have throw rugs, attach them to the floor with carpet tape. Make sure that you have a light switch at the top of the stairs and the bottom of the stairs. If you do not have them, ask someone to add them for you. What else can I do to help prevent  falls? Wear shoes that: Do not have high heels. Have rubber bottoms. Are comfortable and fit you well. Are closed at the toe. Do not wear sandals. If you use a stepladder: Make sure that it is fully opened. Do not climb a closed stepladder. Make sure that both sides of the stepladder are locked into place. Ask someone to hold it for you, if possible. Clearly mark and make sure that you can see: Any grab bars or handrails. First and last steps. Where the edge of each step is. Use tools that help you move around (mobility aids) if they are needed. These include: Canes. Walkers. Scooters. Crutches. Turn on the lights when you go into a dark area. Replace any light bulbs as soon as they burn out. Set up your furniture so you have a clear path. Avoid moving your furniture around. If any of your floors are uneven, fix them. If there are any pets around you, be aware of where they are. Review your medicines with your doctor. Some medicines can make you feel dizzy. This can increase your chance of falling. Ask your doctor what other things that you can do to help prevent falls. This information is not intended to replace advice given to you by your health care provider. Make sure you discuss any questions you have with your health care provider. Document Released: 07/17/2009 Document Revised: 02/26/2016 Document Reviewed: 10/25/2014 Elsevier Interactive Patient Education  2017 [employer].

## 2023-02-09 ENCOUNTER — Ambulatory Visit: Payer: PPO | Admitting: Physical Therapy

## 2023-02-09 ENCOUNTER — Encounter: Payer: Self-pay | Admitting: Physical Therapy

## 2023-02-09 DIAGNOSIS — R2689 Other abnormalities of gait and mobility: Secondary | ICD-10-CM

## 2023-02-09 DIAGNOSIS — M25512 Pain in left shoulder: Secondary | ICD-10-CM | POA: Diagnosis not present

## 2023-02-09 DIAGNOSIS — M6281 Muscle weakness (generalized): Secondary | ICD-10-CM

## 2023-02-09 DIAGNOSIS — R262 Difficulty in walking, not elsewhere classified: Secondary | ICD-10-CM | POA: Diagnosis not present

## 2023-02-09 NOTE — Therapy (Signed)
OUTPATIENT PHYSICAL THERAPY TREATMENT NOTE   Patient Name: Kirk [name] MRN: [id number] DOB:09-17-1941, 82 y.o., male Today's Date: 02/09/2023  PCP: Jacquiline Doe   REFERRING PROVIDER: Jacquiline Doe  END OF SESSION:   PT End of Session - 02/09/23 1500     Visit Number 2    Number of Visits 16    Date for PT Re-Evaluation 03/31/23    Authorization Type HTA    PT Start Time 1514    PT Stop Time 1555    PT Time Calculation (min) 41 min    Activity Tolerance Patient tolerated treatment well    Behavior During Therapy WFL for tasks assessed/performed             Past Medical History:  Diagnosis Date   Cataract    Congenital glaucoma    "both eyes operated on when I was 34 months old"   Cough    Disturbance of skin sensation    Diverticulosis of colon 2014   Diverticulum of esophagus, acquired    Dysphagia, unspecified(787.20)    Encounter for long-term (current) use of other medications    Herpes zoster without mention of complication    Hyperglycemia    patient denies   Hyperlipidemia    Hypertension    Hypertrophy of prostate without urinary obstruction and other lower urinary tract symptoms (LUTS)    Lumbago    Nevus, non-neoplastic    Osteoarthrosis, unspecified whether generalized or localized, unspecified site    pt. denies   Other premature beats    Pain in limb    Plantar fascial fibromatosis    Special screening for malignant neoplasm of prostate    Unspecified glaucoma(365.9)    Unspecified pruritic disorder    Unspecified vitamin D deficiency    Urinary frequency    Zenker's diverticulum    Past Surgical History:  Procedure Laterality Date   CATARACT EXTRACTION BILATERAL W/ ANTERIOR VITRECTOMY Bilateral 1977   COLONOSCOPY  09/06/2013   Marina Goodell   DIRECT LARYNGOSCOPY  10/22/2015   Cervical esophagoscopy.  Endoscopic esophageal diverticulotomy.    FLEXIBLE SIGMOIDOSCOPY  1999   GLAUCOMA SURGERY  1945   congenital glaucoma   HERNIA REPAIR  1993    INCISIONAL HERNIA REPAIR  2008   LARYNGOSCOPY     with stapling of zenkers diverticulum   LUMBAR LAMINECTOMY/ DECOMPRESSION WITH MET-RX Left 08/16/2019   Procedure: Left Lumbar Two-Three Minimally Invasive Laminectomy and Microdiscectomy;  Surgeon: Jadene Pierini, MD;  Location: [facility] OR;  Service: Neurosurgery;  Laterality: Left;  Left Lumbar 2-3 Minimally invasive laminectomy and microdiscectomy   Patient Active Problem List   Diagnosis Date Noted   Central stenosis of spinal canal 05/26/2021   Actinic keratosis 06/16/2020   Bilateral leg weakness 06/16/2020   Onychomycosis 12/13/2019   Herniated lumbar intervertebral disc 08/16/2019   Degenerative lumbar spinal stenosis 02/14/2019   TSH elevation 02/25/2016   Orthostatic hypotension 02/17/2016   Syncope 01/07/2016   Esophageal diverticulum, acquired 10/22/2015   Xeroderma 08/20/2015   Bifascicular block 08/20/2015   Neuropathy (HCC) 02/05/2015   Diverticulosis of colon    Vitamin D deficiency    Hyperglycemia    Dysphagia    Diverticulum of esophagus, acquired    Hyperlipidemia    Glaucoma    BPH (benign prostatic hyperplasia)    Essential hypertension 02/01/2013   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     THERAPY DIAG:  Acute pain of left shoulder  Muscle weakness (generalized)  Other abnormalities of gait and mobility  Difficulty walking    REFERRING DIAG: Acute pain of L shoulder    Rationale for Evaluation and Treatment: Rehabilitation   ONSET DATE: 1 mo ago   SUBJECTIVE:                                                                                                                                                                                       SUBJECTIVE STATEMENT: 02/09/2023 States he thinks his shoulder is a little better but doesn't know how many times a day he should be doing his exercises.   Eval: Pt had Fall about a month ago, outside, when he was bringing garbage can back to the house with his  walker. Fell back against a wall, onto back his L shoulder. States pain pill not helping much. He is Sleeping ok., not waking up with pain. States "can't lift his arm up". He states no other falls recently. He is using walker at all times due to weakness in his legs. He also states increasing difficulty with his eyes/vision.   Hand dominance: Right   PERTINENT HISTORY:  Lumbar Stenosis, poor eyesight, falls  *eyes sensitive to light when laying flat on table *      PAIN:  Are you having pain? Yes: NPRS scale: 5/10 Pain location: L shoulder Pain description: sore/weak Aggravating factors: elevation, reaching, activity  Relieving factors: none stated      PRECAUTIONS: Fall    WEIGHT BEARING RESTRICTIONS: No   FALLS:  Has patient fallen in last 6 months? Yes. Number of falls 1 outside     PLOF: Independent   PATIENT GOALS: decreased pain in L shoulder    NEXT MD VISIT:    OBJECTIVE:    DIAGNOSTIC FINDINGS:      PATIENT SURVEYS :      COGNITION: Overall cognitive status: Within functional limits for tasks assessed                                     SENSATION:   POSTURE:  fwd flexed posture, increased with walking with walker.    UPPER EXTREMITY ROM:    Active Lucia Gaskins ROM Right eval Left eval  Shoulder flexion 115/ 65/140  Shoulder extension      Shoulder abduction   60/120  Shoulder adduction      Shoulder internal rotation   wfl  Shoulder external rotation   /60  Elbow flexion      Elbow extension      Wrist flexion      Wrist extension      Wrist ulnar deviation  Wrist radial deviation      Wrist pronation      Wrist supination      (Blank rows = not tested)   UPPER EXTREMITY MMT:   MMT Right eval Left eval  Shoulder flexion 4 3-  Shoulder extension      Shoulder abduction 4 3-  Shoulder adduction      Shoulder internal rotation   4  Shoulder external rotation   3-  Middle trapezius      Lower trapezius      Elbow flexion      Elbow  extension      Wrist flexion      Wrist extension      Wrist ulnar deviation      Wrist radial deviation      Wrist pronation      Wrist supination      Grip strength (lbs)      (Blank rows = not tested)   SHOULDER SPECIAL TESTS: Significant weakness with ER and with elevation, shoulder hiking present    JOINT MOBILITY TESTING:  Near full PROM, mild joint hypomobility   PALPATION:  Minimal pain to palpate shoulder   Gait: slow speed, reliant on RW, increased trunk flexion, partially due to walker being too low.    Balance and gait tests:  02/09/23- TUG 24.81 seconds with UE support on chair to get up and then use of rolator               TODAY'S TREATMENT:                                                                                                                                         DATE:  02/09/2023 Therapeutic Exercise:  Aerobic: Supine with multiple pillows and washcloth over eyes: 2 minutes AAROM shoulder flexion with dowel, horizontal shoulder abd 2x10 - PT assist with Left arm, PROM left shoulder IR/ER- 6 minutes Prone:  Seated: pulleys flexion 2 minutes, pulleys scaption 2 minutes, scapular retraction - PT assist 2 minutes   Standing: Neuromuscular Re-education: TUG - see obj section Manual Therapy: STM to left pec and biceps - tolerated well  Therapeutic Activity: Self Care: Trigger Point Dry Needling:  Modalities:     PATIENT EDUCATION: Education details: on HEP Person educated: Patient Education method: Programmer, multimedia, Demonstration, Actor cues, Verbal cues, and Handouts Education comprehension: verbalized understanding, returned demonstration, verbal cues required, tactile cues required, and needs further education   HOME EXERCISE PROGRAM: Access Code: [email or web address] URL: [email or web address] Date: 02/03/2023 Prepared by: Sedalia Muta    ASSESSMENT:   CLINICAL IMPRESSION: 02/09/2023 Reviewed exercises and answered all questions  concerning HEP. Verbal cues to keep head upright throughout session as patient has tendency to drop head into end range flexion. Reported reduced pain after massage to left shoulder. Required tactile cues for most shoulder exercises. Added shoulder ER and another pulley exercise to HEP.  Overall tolerated exercises well. Performed TUG on this date for objective measures. Will continue with current POC as tolerated.   Eval: Patient presents with primary complaint of increased weakness and pain in L shoulder. He has significant muscle weakness, with decreased ability for AROM and elevation. There is likely rotator cuff pathology due to weakness, presentation and arm hiking. It is not likely that he will regain full ROM/strength, but will benefit from improving pain and function to max ability. He also has ongoing deficits with balance and gait, and has had recent fall. He has been seen in PT for this in the past, and will benefit from review of LE strength and HEP, as well as gait safety for decreasing falls. He has very poor eyesight which also is effecting safety. Pt to benefit from skilled PT to improve deficits and pain.      OBJECTIVE IMPAIRMENTS: Abnormal gait, decreased activity tolerance, decreased balance, decreased coordination, decreased mobility, difficulty walking, decreased ROM, decreased strength, decreased safety awareness, impaired UE functional use, and pain.    ACTIVITY LIMITATIONS: carrying, lifting, standing, stairs, transfers, reach over head, and locomotion level   PARTICIPATION LIMITATIONS: meal prep, cleaning, and community activity- some limitation due to eyesight   PERSONAL FACTORS: 1-2 comorbidities: stenosis causing weakness in LEs, eyesight, likely RTC pathology  are also affecting patient's functional outcome.    REHAB POTENTIAL: Good   CLINICAL DECISION MAKING: Evolving/moderate complexity   EVALUATION COMPLEXITY: Moderate   GOALS: Goals reviewed with patient? Yes    SHORT TERM GOALS: Target date: 02/17/2023     Pt to be independent with initial HEP   Goal status: INITIAL     LONG TERM GOALS: Target date: 04/01/2023     Pt to be independent with final HEP for Shoulder, and LE strength   Goal status: INITIAL   2.  Pt to demo ability for shoulder elevation to at least 90 deg, with pain 0-2/10, to improve ability for functional use and ADLs .    Goal status: INITIAL   3.  Balance/gait goal TBD   Goal status: INITIAL         PLAN: PT FREQUENCY: 1-2x/week   PT DURATION: 8 weeks   PLANNED INTERVENTIONS: Therapeutic exercises, Therapeutic activity, Neuromuscular re-education, Patient/Family education, Self Care, Joint mobilization, Joint manipulation, Stair training, Orthotic/Fit training, DME instructions, Aquatic Therapy, Dry Needling, Electrical stimulation, Cryotherapy, Moist heat, Taping, Ultrasound, Ionotophoresis 4mg /ml Dexamethasone, Manual therapy,  Vasopneumatic device, Traction, Spinal manipulation, Spinal mobilization,Balance training, Gait training,     PLAN FOR NEXT SESSION:  AAROM, AROM or strength for shoulder as able,  continue to assess balance, gait , TUG, review LE HEP when able    3:57 PM, 02/09/23 Tereasa Coop, DPT Physical Therapy with [facility]

## 2023-02-17 ENCOUNTER — Encounter: Payer: PPO | Admitting: Physical Therapy

## 2023-02-24 ENCOUNTER — Ambulatory Visit: Payer: PPO | Admitting: Physical Therapy

## 2023-02-24 ENCOUNTER — Encounter: Payer: Self-pay | Admitting: Physical Therapy

## 2023-02-24 DIAGNOSIS — R2689 Other abnormalities of gait and mobility: Secondary | ICD-10-CM

## 2023-02-24 DIAGNOSIS — M25512 Pain in left shoulder: Secondary | ICD-10-CM | POA: Diagnosis not present

## 2023-02-24 DIAGNOSIS — M6281 Muscle weakness (generalized): Secondary | ICD-10-CM | POA: Diagnosis not present

## 2023-02-24 NOTE — Therapy (Signed)
OUTPATIENT PHYSICAL THERAPY TREATMENT NOTE   Patient Name: Kirk [name] MRN: [id number] DOB:04/30/1941, 82 y.o., male Today's Date: 02/24/2023  PCP: Jacquiline Doe   REFERRING PROVIDER: Jacquiline Doe  END OF SESSION:   PT End of Session - 02/24/23 1438     Visit Number 3    Number of Visits 16    Date for PT Re-Evaluation 03/31/23    Authorization Type HTA    PT Start Time 1430    PT Stop Time 1510    PT Time Calculation (min) 40 min    Activity Tolerance Patient tolerated treatment well    Behavior During Therapy WFL for tasks assessed/performed             Past Medical History:  Diagnosis Date   Cataract    Congenital glaucoma    "both eyes operated on when I was 30 months old"   Cough    Disturbance of skin sensation    Diverticulosis of colon 2014   Diverticulum of esophagus, acquired    Dysphagia, unspecified(787.20)    Encounter for long-term (current) use of other medications    Herpes zoster without mention of complication    Hyperglycemia    patient denies   Hyperlipidemia    Hypertension    Hypertrophy of prostate without urinary obstruction and other lower urinary tract symptoms (LUTS)    Lumbago    Nevus, non-neoplastic    Osteoarthrosis, unspecified whether generalized or localized, unspecified site    pt. denies   Other premature beats    Pain in limb    Plantar fascial fibromatosis    Special screening for malignant neoplasm of prostate    Unspecified glaucoma(365.9)    Unspecified pruritic disorder    Unspecified vitamin D deficiency    Urinary frequency    Zenker's diverticulum    Past Surgical History:  Procedure Laterality Date   CATARACT EXTRACTION BILATERAL W/ ANTERIOR VITRECTOMY Bilateral 1977   COLONOSCOPY  09/06/2013   Marina Goodell   DIRECT LARYNGOSCOPY  10/22/2015   Cervical esophagoscopy.  Endoscopic esophageal diverticulotomy.    FLEXIBLE SIGMOIDOSCOPY  1999   GLAUCOMA SURGERY  1945   congenital glaucoma   HERNIA REPAIR  1993    INCISIONAL HERNIA REPAIR  2008   LARYNGOSCOPY     with stapling of zenkers diverticulum   LUMBAR LAMINECTOMY/ DECOMPRESSION WITH MET-RX Left 08/16/2019   Procedure: Left Lumbar Two-Three Minimally Invasive Laminectomy and Microdiscectomy;  Surgeon: Jadene Pierini, MD;  Location: [facility] OR;  Service: Neurosurgery;  Laterality: Left;  Left Lumbar 2-3 Minimally invasive laminectomy and microdiscectomy   Patient Active Problem List   Diagnosis Date Noted   Central stenosis of spinal canal 05/26/2021   Actinic keratosis 06/16/2020   Bilateral leg weakness 06/16/2020   Onychomycosis 12/13/2019   Herniated lumbar intervertebral disc 08/16/2019   Degenerative lumbar spinal stenosis 02/14/2019   TSH elevation 02/25/2016   Orthostatic hypotension 02/17/2016   Syncope 01/07/2016   Esophageal diverticulum, acquired 10/22/2015   Xeroderma 08/20/2015   Bifascicular block 08/20/2015   Neuropathy (HCC) 02/05/2015   Diverticulosis of colon    Vitamin D deficiency    Hyperglycemia    Dysphagia    Diverticulum of esophagus, acquired    Hyperlipidemia    Glaucoma    BPH (benign prostatic hyperplasia)    Essential hypertension 02/01/2013   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     THERAPY DIAG:  Acute pain of left shoulder  Muscle weakness (generalized)  Other abnormalities of gait and mobility  REFERRING DIAG: Acute pain of L shoulder    Rationale for Evaluation and Treatment: Rehabilitation   ONSET DATE: 1 mo ago   SUBJECTIVE:                                                                                                                                                                                       SUBJECTIVE STATEMENT: 02/24/2023 No new complaints.   Eval: Pt had Fall about a month ago, outside, when he was bringing garbage can back to the house with his walker. Fell back against a wall, onto back his L shoulder. States pain pill not helping much. He is Sleeping ok., not waking up  with pain. States "can't lift his arm up". He states no other falls recently. He is using walker at all times due to weakness in his legs. He also states increasing difficulty with his eyes/vision.   Hand dominance: Right   PERTINENT HISTORY:  Lumbar Stenosis, poor eyesight, falls  *eyes sensitive to light when laying flat on table *      PAIN:  Are you having pain? Yes: NPRS scale: 5/10 Pain location: L shoulder Pain description: sore/weak Aggravating factors: elevation, reaching, activity  Relieving factors: none stated      PRECAUTIONS: Fall    WEIGHT BEARING RESTRICTIONS: No   FALLS:  Has patient fallen in last 6 months? Yes. Number of falls 1 outside     PLOF: Independent   PATIENT GOALS: decreased pain in L shoulder    NEXT MD VISIT:    OBJECTIVE:    DIAGNOSTIC FINDINGS:      PATIENT SURVEYS :      COGNITION: Overall cognitive status: Within functional limits for tasks assessed                                     SENSATION:   POSTURE:  fwd flexed posture, increased with walking with walker.    UPPER EXTREMITY ROM:    Active Lucia Gaskins ROM Right eval Left eval  Shoulder flexion 115/ 65/140  Shoulder extension      Shoulder abduction   60/120  Shoulder adduction      Shoulder internal rotation   wfl  Shoulder external rotation   /60  Elbow flexion      Elbow extension      Wrist flexion      Wrist extension      Wrist ulnar deviation      Wrist radial deviation      Wrist pronation      Wrist supination      (  Blank rows = not tested)   UPPER EXTREMITY MMT:   MMT Right eval Left eval  Shoulder flexion 4 3-  Shoulder extension      Shoulder abduction 4 3-  Shoulder adduction      Shoulder internal rotation   4  Shoulder external rotation   3-  Middle trapezius      Lower trapezius      Elbow flexion      Elbow extension      Wrist flexion      Wrist extension      Wrist ulnar deviation      Wrist radial deviation      Wrist  pronation      Wrist supination      Grip strength (lbs)      (Blank rows = not tested)   SHOULDER SPECIAL TESTS: Significant weakness with ER and with elevation, shoulder hiking present    JOINT MOBILITY TESTING:  Near full PROM, mild joint hypomobility   PALPATION:  Minimal pain to palpate shoulder   Gait: slow speed, reliant on RW, increased trunk flexion, partially due to walker being too low.    Balance and gait tests:  02/09/23- TUG 24.81 seconds with UE support on chair to get up and then use of rolator               TODAY'S TREATMENT:                                                                                                                                         DATE:   02/24/2023 Therapeutic Exercise:  Aerobic: Supine with multiple pillows and washcloth over eyes:  2 minutes AAROM shoulder flexion with dowel,  horizontal shoulder abd 2x10 ;  Small range flexion 90 deg - 120 , 2 x 10;  chest press motion x 10 on L;   Legs :  review of TA, supine march, SLR and LAQ  x 15 min; all 2 x 10 bil; or HEP Prone:  Seated: pulleys flexion 2 minutes, pulleys scaption 2 minutes,  scapular retraction x 20;  Shoulder ER ROM: x 15;  With YTB (iso) x 15;   Sitting on walker:  2 handed wall slides x 15. Standing: Neuromuscular Re-education:  Manual Therapy:  Therapeutic Activity:    PATIENT EDUCATION: Education details: on HEP Person educated: Patient Education method: Explanation, Demonstration, Tactile cues, Verbal cues, and Handouts Education comprehension: verbalized understanding, returned demonstration, verbal cues required, tactile cues required, and needs further education   HOME EXERCISE PROGRAM: Access Code: [email or web address] URL: [email or web address] Date: 02/03/2023 Prepared by: Sedalia Muta    ASSESSMENT:   CLINICAL IMPRESSION: 02/24/2023 Reviewed LE exercises to continue for HEP, from previous bout of PT. He has good ability for shoulder movement  in supine position. He has little pain , but significant weakness for Er and elevation. Will continue to work on  strength as much as able. Requires max cuing and direction for performing exercises.   Eval: Patient presents with primary complaint of increased weakness and pain in L shoulder. He has significant muscle weakness, with decreased ability for AROM and elevation. There is likely rotator cuff pathology due to weakness, presentation and arm hiking. It is not likely that he will regain full ROM/strength, but will benefit from improving pain and function to max ability. He also has ongoing deficits with balance and gait, and has had recent fall. He has been seen in PT for this in the past, and will benefit from review of LE strength and HEP, as well as gait safety for decreasing falls. He has very poor eyesight which also is effecting safety. Pt to benefit from skilled PT to improve deficits and pain.      OBJECTIVE IMPAIRMENTS: Abnormal gait, decreased activity tolerance, decreased balance, decreased coordination, decreased mobility, difficulty walking, decreased ROM, decreased strength, decreased safety awareness, impaired UE functional use, and pain.    ACTIVITY LIMITATIONS: carrying, lifting, standing, stairs, transfers, reach over head, and locomotion level   PARTICIPATION LIMITATIONS: meal prep, cleaning, and community activity- some limitation due to eyesight   PERSONAL FACTORS: 1-2 comorbidities: stenosis causing weakness in LEs, eyesight, likely RTC pathology  are also affecting patient's functional outcome.    REHAB POTENTIAL: Good   CLINICAL DECISION MAKING: Evolving/moderate complexity   EVALUATION COMPLEXITY: Moderate   GOALS: Goals reviewed with patient? Yes   SHORT TERM GOALS: Target date: 02/17/2023     Pt to be independent with initial HEP   Goal status: INITIAL     LONG TERM GOALS: Target date: 04/01/2023     Pt to be independent with final HEP for Shoulder, and  LE strength   Goal status: INITIAL   2.  Pt to demo ability for shoulder elevation to at least 90 deg, with pain 0-2/10, to improve ability for functional use and ADLs .    Goal status: INITIAL   3.  Balance/gait goal TBD   Goal status: INITIAL         PLAN: PT FREQUENCY: 1-2x/week   PT DURATION: 8 weeks   PLANNED INTERVENTIONS: Therapeutic exercises, Therapeutic activity, Neuromuscular re-education, Patient/Family education, Self Care, Joint mobilization, Joint manipulation, Stair training, Orthotic/Fit training, DME instructions, Aquatic Therapy, Dry Needling, Electrical stimulation, Cryotherapy, Moist heat, Taping, Ultrasound, Ionotophoresis 4mg /ml Dexamethasone, Manual therapy,  Vasopneumatic device, Traction, Spinal manipulation, Spinal mobilization,Balance training, Gait training,     PLAN FOR NEXT SESSION:  AAROM, AROM or strength for shoulder as able,  continue to assess balance, gait , TUG, review LE HEP when able    Sedalia Muta, PT, DPT 2:49 PM  02/24/23

## 2023-03-10 ENCOUNTER — Encounter: Payer: Self-pay | Admitting: Physical Therapy

## 2023-03-10 ENCOUNTER — Ambulatory Visit: Payer: PPO | Admitting: Physical Therapy

## 2023-03-10 DIAGNOSIS — M25512 Pain in left shoulder: Secondary | ICD-10-CM

## 2023-03-10 DIAGNOSIS — M6281 Muscle weakness (generalized): Secondary | ICD-10-CM

## 2023-03-10 NOTE — Therapy (Signed)
OUTPATIENT PHYSICAL THERAPY TREATMENT NOTE   Patient Name: Kirk [name] MRN: [id number] DOB:November 03, 1940, 82 y.o., male Today's Date: 03/10/2023  PCP: Jacquiline Doe   REFERRING PROVIDER: Jacquiline Doe  END OF SESSION:   PT End of Session - 03/10/23 1430     Visit Number 4    Number of Visits 16    Date for PT Re-Evaluation 03/31/23    Authorization Type HTA    PT Start Time 1433    PT Stop Time 1511    PT Time Calculation (min) 38 min    Activity Tolerance Patient tolerated treatment well    Behavior During Therapy WFL for tasks assessed/performed             Past Medical History:  Diagnosis Date   Cataract    Congenital glaucoma    "both eyes operated on when I was 28 months old"   Cough    Disturbance of skin sensation    Diverticulosis of colon 2014   Diverticulum of esophagus, acquired    Dysphagia, unspecified(787.20)    Encounter for long-term (current) use of other medications    Herpes zoster without mention of complication    Hyperglycemia    patient denies   Hyperlipidemia    Hypertension    Hypertrophy of prostate without urinary obstruction and other lower urinary tract symptoms (LUTS)    Lumbago    Nevus, non-neoplastic    Osteoarthrosis, unspecified whether generalized or localized, unspecified site    pt. denies   Other premature beats    Pain in limb    Plantar fascial fibromatosis    Special screening for malignant neoplasm of prostate    Unspecified glaucoma(365.9)    Unspecified pruritic disorder    Unspecified vitamin D deficiency    Urinary frequency    Zenker's diverticulum    Past Surgical History:  Procedure Laterality Date   CATARACT EXTRACTION BILATERAL W/ ANTERIOR VITRECTOMY Bilateral 1977   COLONOSCOPY  09/06/2013   Marina Goodell   DIRECT LARYNGOSCOPY  10/22/2015   Cervical esophagoscopy.  Endoscopic esophageal diverticulotomy.    FLEXIBLE SIGMOIDOSCOPY  1999   GLAUCOMA SURGERY  1945   congenital glaucoma   HERNIA REPAIR  1993    INCISIONAL HERNIA REPAIR  2008   LARYNGOSCOPY     with stapling of zenkers diverticulum   LUMBAR LAMINECTOMY/ DECOMPRESSION WITH MET-RX Left 08/16/2019   Procedure: Left Lumbar Two-Three Minimally Invasive Laminectomy and Microdiscectomy;  Surgeon: Jadene Pierini, MD;  Location: [facility] OR;  Service: Neurosurgery;  Laterality: Left;  Left Lumbar 2-3 Minimally invasive laminectomy and microdiscectomy   Patient Active Problem List   Diagnosis Date Noted   Central stenosis of spinal canal 05/26/2021   Actinic keratosis 06/16/2020   Bilateral leg weakness 06/16/2020   Onychomycosis 12/13/2019   Herniated lumbar intervertebral disc 08/16/2019   Degenerative lumbar spinal stenosis 02/14/2019   TSH elevation 02/25/2016   Orthostatic hypotension 02/17/2016   Syncope 01/07/2016   Esophageal diverticulum, acquired 10/22/2015   Xeroderma 08/20/2015   Bifascicular block 08/20/2015   Neuropathy (HCC) 02/05/2015   Diverticulosis of colon    Vitamin D deficiency    Hyperglycemia    Dysphagia    Diverticulum of esophagus, acquired    Hyperlipidemia    Glaucoma    BPH (benign prostatic hyperplasia)    Essential hypertension 02/01/2013   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     THERAPY DIAG:  Acute pain of left shoulder  Muscle weakness (generalized)    REFERRING DIAG: Acute pain  of L shoulder    Rationale for Evaluation and Treatment: Rehabilitation   ONSET DATE: 1 mo ago   SUBJECTIVE:                                                                                                                                                                                       SUBJECTIVE STATEMENT: 03/10/2023 No issues or difficulties with his exercise, states he feels like his shoulder is feeling better.  Eval: Pt had Fall about a month ago, outside, when he was bringing garbage can back to the house with his walker. Fell back against a wall, onto back his L shoulder. States pain pill not helping much.  He is Sleeping ok., not waking up with pain. States "can't lift his arm up". He states no other falls recently. He is using walker at all times due to weakness in his legs. He also states increasing difficulty with his eyes/vision.   Hand dominance: Right   PERTINENT HISTORY:  Lumbar Stenosis, poor eyesight, falls  *eyes sensitive to light when laying flat on table *      PAIN:  Are you having pain? no   PRECAUTIONS: Fall    WEIGHT BEARING RESTRICTIONS: No   FALLS:  Has patient fallen in last 6 months? Yes. Number of falls 1 outside     PLOF: Independent   PATIENT GOALS: decreased pain in L shoulder    NEXT MD VISIT:    OBJECTIVE:    DIAGNOSTIC FINDINGS:      PATIENT SURVEYS :      COGNITION: Overall cognitive status: Within functional limits for tasks assessed                                     SENSATION:   POSTURE:  fwd flexed posture, increased with walking with walker.    UPPER EXTREMITY ROM:    Active /Passive ROM Right eval Left eval  Shoulder flexion 115/ 65/140  Shoulder extension      Shoulder abduction   60/120  Shoulder adduction      Shoulder internal rotation   wfl  Shoulder external rotation   /60  Elbow flexion      Elbow extension      Wrist flexion      Wrist extension      Wrist ulnar deviation      Wrist radial deviation      Wrist pronation      Wrist supination      (Blank rows = not tested)   UPPER EXTREMITY MMT:   MMT  Right eval Left eval  Shoulder flexion 4 3-  Shoulder extension      Shoulder abduction 4 3-  Shoulder adduction      Shoulder internal rotation   4  Shoulder external rotation   3-  Middle trapezius      Lower trapezius      Elbow flexion      Elbow extension      Wrist flexion      Wrist extension      Wrist ulnar deviation      Wrist radial deviation      Wrist pronation      Wrist supination      Grip strength (lbs)      (Blank rows = not tested)   SHOULDER SPECIAL TESTS: Significant  weakness with ER and with elevation, shoulder hiking present    JOINT MOBILITY TESTING:  Near full PROM, mild joint hypomobility   PALPATION:  Minimal pain to palpate shoulder   Gait: slow speed, reliant on RW, increased trunk flexion, partially due to walker being too low.    Balance and gait tests:  02/09/23- TUG 24.81 seconds with UE support on chair to get up and then use of rolator               TODAY'S TREATMENT:                                                                                                                                         DATE:   03/10/2023 Therapeutic Exercise:  Aerobic: Supine Prone:  Seated: pulleys flexion 2 minutes, pulleys scaption 2 minutes, AAROM shoulder flexion with dowel 3x10,  scapular retraction x 20;  Shoulder ER B AROM:2 x 15;  shoulder ER with YTB 2x15 B, seated in Rolator chair finger ladder PT min support x10 total L, shoulder IR iso into ball 3x6 5" holds L, shoulder flexion with ball x15, pec stretch PT assisted x15 5" holds L  Standing: Neuromuscular Re-education:  Manual Therapy: STM to left pec and biceps - tolerated well - patient seated Therapeutic Activity:    PATIENT EDUCATION: Education details: on HEP Person educated: Patient Education method: Programmer, multimedia, Demonstration, Actor cues, Verbal cues, and Handouts Education comprehension: verbalized understanding, returned demonstration, verbal cues required, tactile cues required, and needs further education   HOME EXERCISE PROGRAM: Access Code: [email or web address] URL: [email or web address] Date: 02/03/2023 Prepared by: Sedalia Muta    ASSESSMENT:   CLINICAL IMPRESSION: 03/10/2023 Continued to require frequent verbal and tactile cues thoruhgout session. No pain just pulling and fatigue in left shoulder. Improved AAROM noted and tolerated wall ladder well with improved OH reach. Will continue to benefit from skilled PT at this time.  Eval: Patient presents with  primary complaint of increased weakness and pain in L shoulder. He has significant muscle weakness, with decreased ability for AROM and elevation. There is likely rotator cuff pathology due  to weakness, presentation and arm hiking. It is not likely that he will regain full ROM/strength, but will benefit from improving pain and function to max ability. He also has ongoing deficits with balance and gait, and has had recent fall. He has been seen in PT for this in the past, and will benefit from review of LE strength and HEP, as well as gait safety for decreasing falls. He has very poor eyesight which also is effecting safety. Pt to benefit from skilled PT to improve deficits and pain.      OBJECTIVE IMPAIRMENTS: Abnormal gait, decreased activity tolerance, decreased balance, decreased coordination, decreased mobility, difficulty walking, decreased ROM, decreased strength, decreased safety awareness, impaired UE functional use, and pain.    ACTIVITY LIMITATIONS: carrying, lifting, standing, stairs, transfers, reach over head, and locomotion level   PARTICIPATION LIMITATIONS: meal prep, cleaning, and community activity- some limitation due to eyesight   PERSONAL FACTORS: 1-2 comorbidities: stenosis causing weakness in LEs, eyesight, likely RTC pathology  are also affecting patient's functional outcome.    REHAB POTENTIAL: Good   CLINICAL DECISION MAKING: Evolving/moderate complexity   EVALUATION COMPLEXITY: Moderate   GOALS: Goals reviewed with patient? Yes   SHORT TERM GOALS: Target date: 02/17/2023     Pt to be independent with initial HEP   Goal status: INITIAL     LONG TERM GOALS: Target date: 04/01/2023     Pt to be independent with final HEP for Shoulder, and LE strength   Goal status: INITIAL   2.  Pt to demo ability for shoulder elevation to at least 90 deg, with pain 0-2/10, to improve ability for functional use and ADLs .    Goal status: INITIAL   3.  Balance/gait goal  TBD   Goal status: INITIAL         PLAN: PT FREQUENCY: 1-2x/week   PT DURATION: 8 weeks   PLANNED INTERVENTIONS: Therapeutic exercises, Therapeutic activity, Neuromuscular re-education, Patient/Family education, Self Care, Joint mobilization, Joint manipulation, Stair training, Orthotic/Fit training, DME instructions, Aquatic Therapy, Dry Needling, Electrical stimulation, Cryotherapy, Moist heat, Taping, Ultrasound, Ionotophoresis 4mg /ml Dexamethasone, Manual therapy,  Vasopneumatic device, Traction, Spinal manipulation, Spinal mobilization,Balance training, Gait training,     PLAN FOR NEXT SESSION:  AAROM, AROM or strength for shoulder as able,  continue to assess balance, gait , TUG, review LE HEP when able    3:16 PM, 03/10/23 Tereasa Coop, DPT Physical Therapy with [facility]

## 2023-03-11 DIAGNOSIS — H2703 Aphakia, bilateral: Secondary | ICD-10-CM | POA: Diagnosis not present

## 2023-03-11 DIAGNOSIS — H402233 Chronic angle-closure glaucoma, bilateral, severe stage: Secondary | ICD-10-CM | POA: Diagnosis not present

## 2023-03-11 DIAGNOSIS — H1713 Central corneal opacity, bilateral: Secondary | ICD-10-CM | POA: Diagnosis not present

## 2023-03-11 DIAGNOSIS — Q150 Congenital glaucoma: Secondary | ICD-10-CM | POA: Diagnosis not present

## 2023-03-17 ENCOUNTER — Encounter: Payer: Self-pay | Admitting: Physical Therapy

## 2023-03-17 ENCOUNTER — Ambulatory Visit: Payer: PPO | Admitting: Physical Therapy

## 2023-03-17 DIAGNOSIS — M25512 Pain in left shoulder: Secondary | ICD-10-CM

## 2023-03-17 DIAGNOSIS — R2689 Other abnormalities of gait and mobility: Secondary | ICD-10-CM | POA: Diagnosis not present

## 2023-03-17 DIAGNOSIS — M6281 Muscle weakness (generalized): Secondary | ICD-10-CM | POA: Diagnosis not present

## 2023-03-17 NOTE — Therapy (Signed)
OUTPATIENT PHYSICAL THERAPY TREATMENT NOTE   Patient Name: Kirk [name] MRN: [id number] DOB:1941/04/14, 82 y.o., male Today's Date: 03/17/2023  PCP: Jacquiline Doe   REFERRING PROVIDER: Jacquiline Doe  END OF SESSION:   PT End of Session - 03/17/23 1645     Visit Number 5    Number of Visits 16    Date for PT Re-Evaluation 03/31/23    Authorization Type HTA    PT Start Time 1430    PT Stop Time 1506    PT Time Calculation (min) 36 min    Activity Tolerance Patient tolerated treatment well    Behavior During Therapy WFL for tasks assessed/performed              Past Medical History:  Diagnosis Date   Cataract    Congenital glaucoma    "both eyes operated on when I was 13 months old"   Cough    Disturbance of skin sensation    Diverticulosis of colon 2014   Diverticulum of esophagus, acquired    Dysphagia, unspecified(787.20)    Encounter for long-term (current) use of other medications    Herpes zoster without mention of complication    Hyperglycemia    patient denies   Hyperlipidemia    Hypertension    Hypertrophy of prostate without urinary obstruction and other lower urinary tract symptoms (LUTS)    Lumbago    Nevus, non-neoplastic    Osteoarthrosis, unspecified whether generalized or localized, unspecified site    pt. denies   Other premature beats    Pain in limb    Plantar fascial fibromatosis    Special screening for malignant neoplasm of prostate    Unspecified glaucoma(365.9)    Unspecified pruritic disorder    Unspecified vitamin D deficiency    Urinary frequency    Zenker's diverticulum    Past Surgical History:  Procedure Laterality Date   CATARACT EXTRACTION BILATERAL W/ ANTERIOR VITRECTOMY Bilateral 1977   COLONOSCOPY  09/06/2013   Marina Goodell   DIRECT LARYNGOSCOPY  10/22/2015   Cervical esophagoscopy.  Endoscopic esophageal diverticulotomy.    FLEXIBLE SIGMOIDOSCOPY  1999   GLAUCOMA SURGERY  1945   congenital glaucoma   HERNIA REPAIR  1993    INCISIONAL HERNIA REPAIR  2008   LARYNGOSCOPY     with stapling of zenkers diverticulum   LUMBAR LAMINECTOMY/ DECOMPRESSION WITH MET-RX Left 08/16/2019   Procedure: Left Lumbar Two-Three Minimally Invasive Laminectomy and Microdiscectomy;  Surgeon: Jadene Pierini, MD;  Location: [facility] OR;  Service: Neurosurgery;  Laterality: Left;  Left Lumbar 2-3 Minimally invasive laminectomy and microdiscectomy   Patient Active Problem List   Diagnosis Date Noted   Central stenosis of spinal canal 05/26/2021   Actinic keratosis 06/16/2020   Bilateral leg weakness 06/16/2020   Onychomycosis 12/13/2019   Herniated lumbar intervertebral disc 08/16/2019   Degenerative lumbar spinal stenosis 02/14/2019   TSH elevation 02/25/2016   Orthostatic hypotension 02/17/2016   Syncope 01/07/2016   Esophageal diverticulum, acquired 10/22/2015   Xeroderma 08/20/2015   Bifascicular block 08/20/2015   Neuropathy (HCC) 02/05/2015   Diverticulosis of colon    Vitamin D deficiency    Hyperglycemia    Dysphagia    Diverticulum of esophagus, acquired    Hyperlipidemia    Glaucoma    BPH (benign prostatic hyperplasia)    Essential hypertension 02/01/2013   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 Feels his legs are continuing to get weaker, and he is really relying on walker.   Eval: Pt had Fall about a month ago, outside, when he was bringing garbage can back to the house with his walker. Fell back against a  wall, onto back his L shoulder. States pain pill not helping much. He is Sleeping ok., not waking up with pain. States "can't lift his arm up". He states no other falls recently. He is using walker at all times due to weakness in his legs. He also states increasing difficulty with his eyes/vision.   Hand dominance: Right   PERTINENT HISTORY:  Lumbar Stenosis, poor eyesight, falls  *eyes sensitive to light when laying flat on table *      PAIN:  Are you having pain? no   PRECAUTIONS: Fall    WEIGHT BEARING RESTRICTIONS: No   FALLS:  Has patient fallen in last 6 months? Yes. Number of falls 1 outside     PLOF: Independent   PATIENT GOALS: decreased pain in L shoulder    NEXT MD VISIT:    OBJECTIVE:    DIAGNOSTIC FINDINGS:      PATIENT SURVEYS :      COGNITION: Overall cognitive status: Within functional limits for tasks assessed                                     SENSATION:   POSTURE:  fwd flexed posture, increased with walking with walker.    UPPER EXTREMITY ROM:    Active /Passive ROM Right eval Left eval Left 03/17/23  Shoulder flexion 115/ 65/140 70/  Shoulder extension       Shoulder abduction   60/120 70/  Shoulder adduction       Shoulder internal rotation   wfl   Shoulder external rotation   /60   Elbow flexion       Elbow extension       Wrist flexion       Wrist extension       Wrist ulnar deviation       Wrist radial deviation  Wrist pronation       Wrist supination       (Blank rows = not tested)   UPPER EXTREMITY MMT:   MMT Right eval Left eval Left 03/17/23  Shoulder flexion 4 3- 3-  Shoulder extension       Shoulder abduction 4 3- 3-  Shoulder adduction       Shoulder internal rotation   4 4  Shoulder external rotation   3- 3-  Middle trapezius       Lower trapezius       Elbow flexion       Elbow extension       Wrist flexion       Wrist extension       Wrist ulnar deviation       Wrist radial deviation       Wrist  pronation       Wrist supination       Grip strength (lbs)       (Blank rows = not tested)   SHOULDER SPECIAL TESTS: Significant weakness with ER and with elevation, shoulder hiking present    JOINT MOBILITY TESTING:  Near full PROM, mild joint hypomobility   PALPATION:  Minimal pain to palpate shoulder   Gait: slow speed, reliant on RW, increased trunk flexion, partially due to walker being too low.    Balance and gait tests:  02/09/23- TUG 24.81 seconds with UE support on chair to get up and then use of rolator               TODAY'S TREATMENT:                                                                                                                                         DATE:   03/17/2023 Therapeutic Exercise:  Aerobic: Supine:  IR/ER 3 lb x 15;  L shoulder flexion AROM 2 x 10;  chest press 2 lb x 10; Shoulder ER YTB 2 x 10;  Prone:  Seated: ;  shoulder ER with YTB 2x10 B, Standing: Neuromuscular Re-education:  Manual Therapy:  Therapeutic Activity:    PATIENT EDUCATION: Education details: on HEP Person educated: Patient Education method: Programmer, multimedia, Demonstration, Actor cues, Verbal cues, and Handouts Education comprehension: verbalized understanding, returned demonstration, verbal cues required, tactile cues required, and needs further education   HOME EXERCISE PROGRAM: Access Code: [email or web address] URL: [email or web address] Date: 02/03/2023 Prepared by: Sedalia Muta    ASSESSMENT:   CLINICAL IMPRESSION: 03/17/2023 Pt with no pain in shoulder. He has only very mild improvements in ability for AROM today from Eval. He reports doing most functional activities at home that he needs to be doing. Do not expect that strength will fully return. He will benefit from review of LE HEP at next visit.    Eval: Patient presents with primary complaint of increased weakness and pain in L shoulder. He has  significant muscle weakness, with decreased ability for  AROM and elevation. There is likely rotator cuff pathology due to weakness, presentation and arm hiking. It is not likely that he will regain full ROM/strength, but will benefit from improving pain and function to max ability. He also has ongoing deficits with balance and gait, and has had recent fall. He has been seen in PT for this in the past, and will benefit from review of LE strength and HEP, as well as gait safety for decreasing falls. He has very poor eyesight which also is effecting safety. Pt to benefit from skilled PT to improve deficits and pain.      OBJECTIVE IMPAIRMENTS: Abnormal gait, decreased activity tolerance, decreased balance, decreased coordination, decreased mobility, difficulty walking, decreased ROM, decreased strength, decreased safety awareness, impaired UE functional use, and pain.    ACTIVITY LIMITATIONS: carrying, lifting, standing, stairs, transfers, reach over head, and locomotion level   PARTICIPATION LIMITATIONS: meal prep, cleaning, and community activity- some limitation due to eyesight   PERSONAL FACTORS: 1-2 comorbidities: stenosis causing weakness in LEs, eyesight, likely RTC pathology  are also affecting patient's functional outcome.    REHAB POTENTIAL: Good   CLINICAL DECISION MAKING: Evolving/moderate complexity   EVALUATION COMPLEXITY: Moderate   GOALS: Goals reviewed with patient? Yes   SHORT TERM GOALS: Target date: 02/17/2023     Pt to be independent with initial HEP   Goal status: INITIAL     LONG TERM GOALS: Target date: 04/01/2023     Pt to be independent with final HEP for Shoulder, and LE strength   Goal status: INITIAL   2.  Pt to demo ability for shoulder elevation to at least 90 deg, with pain 0-2/10, to improve ability for functional use and ADLs .    Goal status: INITIAL   3.  Balance/gait goal TBD   Goal status: INITIAL         PLAN: PT FREQUENCY: 1-2x/week   PT DURATION: 8 weeks   PLANNED INTERVENTIONS:  Therapeutic exercises, Therapeutic activity, Neuromuscular re-education, Patient/Family education, Self Care, Joint mobilization, Joint manipulation, Stair training, Orthotic/Fit training, DME instructions, Aquatic Therapy, Dry Needling, Electrical stimulation, Cryotherapy, Moist heat, Taping, Ultrasound, Ionotophoresis 4mg /ml Dexamethasone, Manual therapy,  Vasopneumatic device, Traction, Spinal manipulation, Spinal mobilization,Balance training, Gait training,     PLAN FOR NEXT SESSION:  AAROM, AROM or strength for shoulder as able,  continue to assess balance, gait , TUG, review LE HEP when able     Sedalia Muta, PT, DPT 4:45 PM  03/17/23

## 2023-03-24 ENCOUNTER — Encounter: Payer: Self-pay | Admitting: Physical Therapy

## 2023-03-24 ENCOUNTER — Ambulatory Visit: Payer: PPO | Admitting: Physical Therapy

## 2023-03-24 DIAGNOSIS — M6281 Muscle weakness (generalized): Secondary | ICD-10-CM

## 2023-03-24 DIAGNOSIS — M25512 Pain in left shoulder: Secondary | ICD-10-CM

## 2023-03-24 DIAGNOSIS — R2689 Other abnormalities of gait and mobility: Secondary | ICD-10-CM | POA: Diagnosis not present

## 2023-03-24 NOTE — Therapy (Signed)
OUTPATIENT PHYSICAL THERAPY TREATMENT NOTE   Patient Name: Kirk [name] MRN: [id number] DOB:05-10-1941, 82 y.o., male Today's Date: 03/24/2023  PCP: Jacquiline Doe   REFERRING PROVIDER: Jacquiline Doe  END OF SESSION:   PT End of Session - 03/24/23 1616     Visit Number 6    Number of Visits 16    Date for PT Re-Evaluation 03/31/23    Authorization Type HTA    PT Start Time 1518    PT Stop Time 1600    PT Time Calculation (min) 42 min    Activity Tolerance Patient tolerated treatment well    Behavior During Therapy WFL for tasks assessed/performed               Past Medical History:  Diagnosis Date   Cataract    Congenital glaucoma    "both eyes operated on when I was 54 months old"   Cough    Disturbance of skin sensation    Diverticulosis of colon 2014   Diverticulum of esophagus, acquired    Dysphagia, unspecified(787.20)    Encounter for long-term (current) use of other medications    Herpes zoster without mention of complication    Hyperglycemia    patient denies   Hyperlipidemia    Hypertension    Hypertrophy of prostate without urinary obstruction and other lower urinary tract symptoms (LUTS)    Lumbago    Nevus, non-neoplastic    Osteoarthrosis, unspecified whether generalized or localized, unspecified site    pt. denies   Other premature beats    Pain in limb    Plantar fascial fibromatosis    Special screening for malignant neoplasm of prostate    Unspecified glaucoma(365.9)    Unspecified pruritic disorder    Unspecified vitamin D deficiency    Urinary frequency    Zenker's diverticulum    Past Surgical History:  Procedure Laterality Date   CATARACT EXTRACTION BILATERAL W/ ANTERIOR VITRECTOMY Bilateral 1977   COLONOSCOPY  09/06/2013   Marina Goodell   DIRECT LARYNGOSCOPY  10/22/2015   Cervical esophagoscopy.  Endoscopic esophageal diverticulotomy.    FLEXIBLE SIGMOIDOSCOPY  1999   GLAUCOMA SURGERY  1945   congenital glaucoma   HERNIA REPAIR  1993    INCISIONAL HERNIA REPAIR  2008   LARYNGOSCOPY     with stapling of zenkers diverticulum   LUMBAR LAMINECTOMY/ DECOMPRESSION WITH MET-RX Left 08/16/2019   Procedure: Left Lumbar Two-Three Minimally Invasive Laminectomy and Microdiscectomy;  Surgeon: Jadene Pierini, MD;  Location: [facility] OR;  Service: Neurosurgery;  Laterality: Left;  Left Lumbar 2-3 Minimally invasive laminectomy and microdiscectomy   Patient Active Problem List   Diagnosis Date Noted   Central stenosis of spinal canal 05/26/2021   Actinic keratosis 06/16/2020   Bilateral leg weakness 06/16/2020   Onychomycosis 12/13/2019   Herniated lumbar intervertebral disc 08/16/2019   Degenerative lumbar spinal stenosis 02/14/2019   TSH elevation 02/25/2016   Orthostatic hypotension 02/17/2016   Syncope 01/07/2016   Esophageal diverticulum, acquired 10/22/2015   Xeroderma 08/20/2015   Bifascicular block 08/20/2015   Neuropathy (HCC) 02/05/2015   Diverticulosis of colon    Vitamin D deficiency    Hyperglycemia    Dysphagia    Diverticulum of esophagus, acquired    Hyperlipidemia    Glaucoma    BPH (benign prostatic hyperplasia)    Essential hypertension 02/01/2013   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     THERAPY DIAG:  Acute pain of left shoulder  Other abnormalities of gait and mobility  Muscle  weakness (generalized)    REFERRING DIAG: Acute pain of L shoulder    Rationale for Evaluation and Treatment: Rehabilitation   ONSET DATE: 1 mo ago   SUBJECTIVE:                                                                                                                                                                                       SUBJECTIVE STATEMENT: 03/24/2023 Pt states no pain in his shoulder, and is better able to use his arm to comb his hair. States weakness in legs that is frustrating to him.   Eval: Pt had Fall about a month ago, outside, when he was bringing garbage can back to the house with his  walker. Fell back against a wall, onto back his L shoulder. States pain pill not helping much. He is Sleeping ok., not waking up with pain. States "can't lift his arm up". He states no other falls recently. He is using walker at all times due to weakness in his legs. He also states increasing difficulty with his eyes/vision.   Hand dominance: Right   PERTINENT HISTORY:  Lumbar Stenosis, poor eyesight, falls  *eyes sensitive to light when laying flat on table *      PAIN:  Are you having pain? no   PRECAUTIONS: Fall    WEIGHT BEARING RESTRICTIONS: No   FALLS:  Has patient fallen in last 6 months? Yes. Number of falls 1 outside     PLOF: Independent   PATIENT GOALS: decreased pain in L shoulder    NEXT MD VISIT:    OBJECTIVE:    DIAGNOSTIC FINDINGS:      PATIENT SURVEYS :      COGNITION: Overall cognitive status: Within functional limits for tasks assessed                                     SENSATION:   POSTURE:  fwd flexed posture, increased with walking with walker.    UPPER EXTREMITY ROM:    Active /Passive ROM Right eval Left eval Left 03/17/23  Shoulder flexion 115/ 65/140 70/  Shoulder extension       Shoulder abduction   60/120 70/  Shoulder adduction       Shoulder internal rotation   wfl   Shoulder external rotation   /60   Elbow flexion       Elbow extension       Wrist flexion       Wrist extension       Wrist ulnar deviation  Wrist radial deviation       Wrist pronation       Wrist supination       (Blank rows = not tested)   UPPER EXTREMITY MMT:   MMT Right eval Left eval Left 03/17/23  Shoulder flexion 4 3- 3-  Shoulder extension       Shoulder abduction 4 3- 3-  Shoulder adduction       Shoulder internal rotation   4 4  Shoulder external rotation   3- 3-  Middle trapezius       Lower trapezius       Elbow flexion       Elbow extension       Wrist flexion       Wrist extension       Wrist ulnar deviation       Wrist  radial deviation       Wrist pronation       Wrist supination       Grip strength (lbs)       (Blank rows = not tested)   SHOULDER SPECIAL TESTS: Significant weakness with ER and with elevation, shoulder hiking present    JOINT MOBILITY TESTING:  Near full PROM, mild joint hypomobility   PALPATION:  Minimal pain to palpate shoulder   Gait: slow speed, reliant on RW, increased trunk flexion, partially due to walker being too low.    Balance and gait tests:  02/09/23- TUG 24.81 seconds with UE support on chair to get up and then use of rolator               TODAY'S TREATMENT:                                                                                                                                         DATE:   03/24/2023 Therapeutic Exercise:  Aerobic: Supine:   SLR 2 x 10 bil;  bridging x 10;  L shoulder flexion/arom x 10;  ER Ytb x 10 (states "pressure" on eyes)  Prone:  Seated: LAQ 2.5 lb x 20 bil;  ankle PF Rtb x 15 bil;  Standing:  march x 15 at counter;  attempt for HR (unable/weak);  standing posture/cuing for upright posture x 2 min;   Neuromuscular Re-education:  Manual Therapy:  Therapeutic Activity:     Therapeutic Exercise:  Aerobic: Supine:  IR/ER 3 lb x 15;  L shoulder flexion AROM 2 x 10;  chest press 2 lb x 10; Shoulder ER YTB 2 x 10;  Prone:  Seated: ;  shoulder ER with YTB 2x10 B, Standing: Neuromuscular Re-education:  Manual Therapy:  Therapeutic Activity:    PATIENT EDUCATION: Education details: on HEP Person educated: Patient Education method: Programmer, multimedia, Demonstration, Actor cues, Verbal cues, and Handouts Education comprehension: verbalized understanding, returned demonstration, verbal cues required, tactile cues required, and needs further education   HOME  EXERCISE PROGRAM: Access Code: [email or web address] URL: [email or web address] Date: 02/03/2023 Prepared by: Sedalia Muta    ASSESSMENT:   CLINICAL  IMPRESSION: 03/24/2023 Focus on review for LE strengthening today. Pt with good tolerance for ther ex , requires cueing for slower speed. He requires max cuing for upright posture. Dan Humphreys is too low, but does not adjust any higher and also discussed brakes being broken and being a safety issue with walker. Pt to be seen for 1 more visit, will continue LE strength and education for HEP to continue strengthening for leg weakness. He has not had significant strength improvements in the past, and continues to have much weakness in R lower leg.     Eval: Patient presents with primary complaint of increased weakness and pain in L shoulder. He has significant muscle weakness, with decreased ability for AROM and elevation. There is likely rotator cuff pathology due to weakness, presentation and arm hiking. It is not likely that he will regain full ROM/strength, but will benefit from improving pain and function to max ability. He also has ongoing deficits with balance and gait, and has had recent fall. He has been seen in PT for this in the past, and will benefit from review of LE strength and HEP, as well as gait safety for decreasing falls. He has very poor eyesight which also is effecting safety. Pt to benefit from skilled PT to improve deficits and pain.      OBJECTIVE IMPAIRMENTS: Abnormal gait, decreased activity tolerance, decreased balance, decreased coordination, decreased mobility, difficulty walking, decreased ROM, decreased strength, decreased safety awareness, impaired UE functional use, and pain.    ACTIVITY LIMITATIONS: carrying, lifting, standing, stairs, transfers, reach over head, and locomotion level   PARTICIPATION LIMITATIONS: meal prep, cleaning, and community activity- some limitation due to eyesight   PERSONAL FACTORS: 1-2 comorbidities: stenosis causing weakness in LEs, eyesight, likely RTC pathology  are also affecting patient's functional outcome.    REHAB POTENTIAL: Good   CLINICAL  DECISION MAKING: Evolving/moderate complexity   EVALUATION COMPLEXITY: Moderate   GOALS: Goals reviewed with patient? Yes   SHORT TERM GOALS: Target date: 02/17/2023     Pt to be independent with initial HEP   Goal status: INITIAL     LONG TERM GOALS: Target date: 04/01/2023     Pt to be independent with final HEP for Shoulder, and LE strength   Goal status: INITIAL   2.  Pt to demo ability for shoulder elevation to at least 90 deg, with pain 0-2/10, to improve ability for functional use and ADLs .    Goal status: INITIAL   3.  Balance/gait goal TBD   Goal status: INITIAL         PLAN: PT FREQUENCY: 1-2x/week   PT DURATION: 8 weeks   PLANNED INTERVENTIONS: Therapeutic exercises, Therapeutic activity, Neuromuscular re-education, Patient/Family education, Self Care, Joint mobilization, Joint manipulation, Stair training, Orthotic/Fit training, DME instructions, Aquatic Therapy, Dry Needling, Electrical stimulation, Cryotherapy, Moist heat, Taping, Ultrasound, Ionotophoresis 4mg /ml Dexamethasone, Manual therapy,  Vasopneumatic device, Traction, Spinal manipulation, Spinal mobilization,Balance training, Gait training,     PLAN FOR NEXT SESSION:  AAROM, AROM or strength for shoulder as able,  continue to assess balance, gait , TUG, review LE HEP when able     Sedalia Muta, PT, DPT 4:16 PM  03/24/23

## 2023-03-31 ENCOUNTER — Encounter: Payer: Self-pay | Admitting: Physical Therapy

## 2023-03-31 ENCOUNTER — Ambulatory Visit (INDEPENDENT_AMBULATORY_CARE_PROVIDER_SITE_OTHER): Payer: PPO | Admitting: Physical Therapy

## 2023-03-31 DIAGNOSIS — R2689 Other abnormalities of gait and mobility: Secondary | ICD-10-CM | POA: Diagnosis not present

## 2023-03-31 DIAGNOSIS — M25512 Pain in left shoulder: Secondary | ICD-10-CM

## 2023-03-31 DIAGNOSIS — M6281 Muscle weakness (generalized): Secondary | ICD-10-CM

## 2023-03-31 NOTE — Therapy (Signed)
OUTPATIENT PHYSICAL THERAPY TREATMENT NOTE   Patient Name: Kirk [name] MRN: [id number] DOB:[birth year], 82 y.o., male Today's Date: 03/31/2023  PCP: Jacquiline Doe   REFERRING PROVIDER: Jacquiline Doe  END OF SESSION:   PT End of Session - 03/31/23 1519     Visit Number 7    Number of Visits 16    Date for PT Re-Evaluation 03/31/23    Authorization Type HTA    PT Start Time 1520    PT Stop Time 1600    PT Time Calculation (min) 40 min    Activity Tolerance Patient tolerated treatment well    Behavior During Therapy WFL for tasks assessed/performed               Past Medical History:  Diagnosis Date   Cataract    Congenital glaucoma    "both eyes operated on when I was 46 months old"   Cough    Disturbance of skin sensation    Diverticulosis of colon 2014   Diverticulum of esophagus, acquired    Dysphagia, unspecified(787.20)    Encounter for long-term (current) use of other medications    Herpes zoster without mention of complication    Hyperglycemia    patient denies   Hyperlipidemia    Hypertension    Hypertrophy of prostate without urinary obstruction and other lower urinary tract symptoms (LUTS)    Lumbago    Nevus, non-neoplastic    Osteoarthrosis, unspecified whether generalized or localized, unspecified site    pt. denies   Other premature beats    Pain in limb    Plantar fascial fibromatosis    Special screening for malignant neoplasm of prostate    Unspecified glaucoma(365.9)    Unspecified pruritic disorder    Unspecified vitamin D deficiency    Urinary frequency    Zenker's diverticulum    Past Surgical History:  Procedure Laterality Date   CATARACT EXTRACTION BILATERAL W/ ANTERIOR VITRECTOMY Bilateral 1977   COLONOSCOPY  09/06/2013   [facility]ina Goodell   DIRECT LARYNGOSCOPY  10/22/2015   Cervical esophagoscopy.  Endoscopic esophageal diverticulotomy.    FLEXIBLE SIGMOIDOSCOPY  1999   GLAUCOMA SURGERY  1945   congenital glaucoma   HERNIA REPAIR  1993    INCISIONAL HERNIA REPAIR  2008   LARYNGOSCOPY     with stapling of zenkers diverticulum   LUMBAR LAMINECTOMY/ DECOMPRESSION WITH MET-RX Left 08/16/2019   Procedure: Left Lumbar Two-Three Minimally Invasive Laminectomy and Microdiscectomy;  Surgeon: Jadene Pierini, MD;  Location: [facility] OR;  Service: Neurosurgery;  Laterality: Left;  Left Lumbar 2-3 Minimally invasive laminectomy and microdiscectomy   Patient Active Problem List   Diagnosis Date Noted   Central stenosis of spinal canal 05/26/2021   Actinic keratosis 06/16/2020   Bilateral leg weakness 06/16/2020   Onychomycosis 12/13/2019   Herniated lumbar intervertebral disc 08/16/2019   Degenerative lumbar spinal stenosis 02/14/2019   TSH elevation 02/25/2016   Orthostatic hypotension 02/17/2016   Syncope 01/07/2016   Esophageal diverticulum, acquired 10/22/2015   Xeroderma 08/20/2015   Bifascicular block 08/20/2015   Neuropathy (HCC) 02/05/2015   Diverticulosis of colon    Vitamin D deficiency    Hyperglycemia    Dysphagia    Diverticulum of esophagus, acquired    Hyperlipidemia    Glaucoma    BPH (benign prostatic hyperplasia)    Essential hypertension 02/01/2013   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 States legs are just very weak.   Eval: Pt had Fall about a month ago, outside, when he was bringing garbage can back to the house with his walker. Fell back against a wall, onto back his  L shoulder. States pain pill not helping much. He is Sleeping ok., not waking up with pain. States "can't lift his arm up". He states no other falls recently. He is using walker at all times due to weakness in his legs. He also states increasing difficulty with his eyes/vision.   Hand dominance: Right   PERTINENT HISTORY:  Lumbar Stenosis, poor eyesight, falls  *eyes sensitive to light when laying flat on table *      PAIN:  Are you having pain? no   PRECAUTIONS: Fall    WEIGHT BEARING RESTRICTIONS: No   FALLS:  Has patient fallen in last 6 months? Yes. Number of falls 1 outside     PLOF: Independent   PATIENT GOALS: decreased pain in L shoulder    NEXT MD VISIT:    OBJECTIVE:    DIAGNOSTIC FINDINGS:      PATIENT SURVEYS :      COGNITION: Overall cognitive status: Within functional limits for tasks assessed                                     SENSATION:   POSTURE:  fwd flexed posture, increased with walking with walker.    UPPER EXTREMITY ROM:    Active /Passive ROM Right eval Left eval Left 03/17/23 03/31/23 Left   Shoulder flexion 115/ 65/140 70/ 100/  Shoulder extension        Shoulder abduction   60/120 70/ 90/  Shoulder adduction        Shoulder internal rotation   wfl    Shoulder external rotation   /60    Elbow flexion        Elbow extension        Wrist flexion        Wrist extension        Wrist ulnar deviation  Wrist radial deviation        Wrist pronation        Wrist supination        (Blank rows = not tested)   UPPER EXTREMITY MMT:   MMT Right eval Left eval Left 03/17/23 L 6/24  Shoulder flexion 4 3- 3- 3-  Shoulder extension        Shoulder abduction 4 3- 3- 3-  Shoulder adduction        Shoulder internal rotation   4 4 4   Shoulder external rotation   3- 3- 3-  Middle trapezius        Lower trapezius        Elbow flexion        Elbow extension        Wrist flexion        Wrist extension        Wrist ulnar deviation         Wrist radial deviation        Wrist pronation        Wrist supination        Grip strength (lbs)        (Blank rows = not tested)       Gait: slow speed, reliant on RW, increased trunk flexion, partially due to walker being too low.    Balance and gait tests:  02/09/23- TUG 24.81 seconds with UE support on chair to get up and then use of rolator  03/31/23:   TUG:  22.1 sec               TODAY'S TREATMENT:                                                                                                                                         DATE:   03/31/2023 Therapeutic Exercise:  Aerobic: Supine:   SLR 2 x 10 bil;    L shoulder flexion/arom x 10;  ER ROM 2 x 10; clams GTB x 20;  Prone:  Seated: LAQ 2.5 lb x 20 bil;   ankle PF Rtb x 15 bil;  sit to stand x 10;  Standing:  march x 15 at counter;   standing posture/cuing for upright posture x 2 min;   Neuromuscular Re-education:  Manual Therapy:  Therapeutic Activity:     PATIENT EDUCATION: Education details:reviewed  HEP and poc  Person educated: Patient Education method: Explanation, Demonstration, Tactile cues, Verbal cues, and Handouts Education comprehension: verbalized understanding, returned demonstration, verbal cues required, tactile cues required, and needs further education   HOME EXERCISE PROGRAM: Access Code: [email or web address] URL: [email or web address] Date: 02/03/2023 Prepared by: Sedalia Muta    ASSESSMENT:   CLINICAL IMPRESSION: 03/31/2023 Pt has met goals for shoulder. He has non painful ROM up to at least 90-100 deg. He is able to use it for functional activity at home. He has likely RTC  tear , causing much weakness for Er and elevation.  HEP for shoulder reviewed .He continues to have much weakness in LEs, that has been unchanged t this time or in the past,  that limits his functional activity, along with his eyesight. We have reviewed HEP for leg strengthening to continue. He is safe with use  of walker at this time. Pt will be d/c at this time, pt in agreement with plan.   Eval: Patient presents with primary complaint of increased weakness and pain in L shoulder. He has significant muscle weakness, with decreased ability for AROM and elevation. There is likely rotator cuff pathology due to weakness, presentation and arm hiking. It is not likely that he will regain full ROM/strength, but will benefit from improving pain and function to max ability. He also has ongoing deficits with balance and gait, and has had recent fall. He has been seen in PT for this in the past, and will benefit from review of LE strength and HEP, as well as gait safety for decreasing falls. He has very poor eyesight which also is effecting safety. Pt to benefit from skilled PT to improve deficits and pain.      OBJECTIVE IMPAIRMENTS: Abnormal gait, decreased activity tolerance, decreased balance, decreased coordination, decreased mobility, difficulty walking, decreased ROM, decreased strength, decreased safety awareness, impaired UE functional use, and pain.    ACTIVITY LIMITATIONS: carrying, lifting, standing, stairs, transfers, reach over head, and locomotion level   PARTICIPATION LIMITATIONS: meal prep, cleaning, and community activity- some limitation due to eyesight   PERSONAL FACTORS: 1-2 comorbidities: stenosis causing weakness in LEs, eyesight, likely RTC pathology  are also affecting patient's functional outcome.    REHAB POTENTIAL: Good   CLINICAL DECISION MAKING: Evolving/moderate complexity   EVALUATION COMPLEXITY: Moderate   GOALS: Goals reviewed with patient? Yes   SHORT TERM GOALS: Target date: 02/17/2023     Pt to be independent with initial HEP   Goal status: MET      LONG TERM GOALS: Target date: 04/01/2023     Pt to be independent with final HEP for Shoulder, and LE strength   Goal status: MET   2.  Pt to demo ability for shoulder elevation to at least 90 deg, with pain 0-2/10,  to improve ability for functional use and ADLs .    Goal status: MET   3.  Balance/gait goal TBD          PLAN: PT FREQUENCY: 1-2x/week   PT DURATION: 8 weeks   PLANNED INTERVENTIONS: Therapeutic exercises, Therapeutic activity, Neuromuscular re-education, Patient/Family education, Self Care, Joint mobilization, Joint manipulation, Stair training, Orthotic/Fit training, DME instructions, Aquatic Therapy, Dry Needling, Electrical stimulation, Cryotherapy, Moist heat, Taping, Ultrasound, Ionotophoresis 4mg /ml Dexamethasone, Manual therapy,  Vasopneumatic device, Traction, Spinal manipulation, Spinal mobilization,Balance training, Gait training,     PLAN FOR NEXT SESSION:     Sedalia Muta, PT, DPT 3:28 PM  03/31/23   PHYSICAL THERAPY DISCHARGE SUMMARY  Visits from Start of Care:  7 Plan: Patient agrees to discharge.  Patient goals were  met. Patient is being discharged due to meeting the stated rehab goals.    Goals met for shoulder.   Sedalia Muta, PT, DPT 12:16 PM  04/01/23

## 2023-04-26 ENCOUNTER — Telehealth: Payer: Self-pay | Admitting: *Deleted

## 2023-04-26 NOTE — Telephone Encounter (Signed)
I attempted to contact patient by telephone but was unsuccessful. According to the patient's chart they are due for follow up  with [facility]. I have left a HIPAA compliant message advising the patient to contact [facility] at [phone]. I will continue to follow up with the patient to make sure this appointment is scheduled.

## 2023-04-29 NOTE — Telephone Encounter (Signed)
I attempted to contact patient by telephone but was unsuccessful. According to the patient's chart they are due for follow up  with [facility]. I have left a HIPAA compliant message advising the patient to contact [facility] at [phone]. I will continue to follow up with the patient to make sure this appointment is scheduled.

## 2023-07-11 DIAGNOSIS — H04123 Dry eye syndrome of bilateral lacrimal glands: Secondary | ICD-10-CM | POA: Diagnosis not present

## 2023-07-11 DIAGNOSIS — H2703 Aphakia, bilateral: Secondary | ICD-10-CM | POA: Diagnosis not present

## 2023-07-11 DIAGNOSIS — H1713 Central corneal opacity, bilateral: Secondary | ICD-10-CM | POA: Diagnosis not present

## 2023-07-11 DIAGNOSIS — H402233 Chronic angle-closure glaucoma, bilateral, severe stage: Secondary | ICD-10-CM | POA: Diagnosis not present

## 2023-08-04 DIAGNOSIS — L578 Other skin changes due to chronic exposure to nonionizing radiation: Secondary | ICD-10-CM | POA: Diagnosis not present

## 2023-08-04 DIAGNOSIS — L821 Other seborrheic keratosis: Secondary | ICD-10-CM | POA: Diagnosis not present

## 2023-08-04 DIAGNOSIS — L814 Other melanin hyperpigmentation: Secondary | ICD-10-CM | POA: Diagnosis not present

## 2023-08-04 DIAGNOSIS — D229 Melanocytic nevi, unspecified: Secondary | ICD-10-CM | POA: Diagnosis not present

## 2023-08-04 DIAGNOSIS — L570 Actinic keratosis: Secondary | ICD-10-CM | POA: Diagnosis not present

## 2023-11-02 DIAGNOSIS — H402233 Chronic angle-closure glaucoma, bilateral, severe stage: Secondary | ICD-10-CM | POA: Diagnosis not present

## 2023-11-02 DIAGNOSIS — H04123 Dry eye syndrome of bilateral lacrimal glands: Secondary | ICD-10-CM | POA: Diagnosis not present

## 2023-11-02 DIAGNOSIS — H1713 Central corneal opacity, bilateral: Secondary | ICD-10-CM | POA: Diagnosis not present

## 2023-11-02 DIAGNOSIS — H1045 Other chronic allergic conjunctivitis: Secondary | ICD-10-CM | POA: Diagnosis not present

## 2023-12-07 DIAGNOSIS — H402233 Chronic angle-closure glaucoma, bilateral, severe stage: Secondary | ICD-10-CM | POA: Diagnosis not present

## 2023-12-25 ENCOUNTER — Other Ambulatory Visit: Payer: Self-pay | Admitting: Family Medicine

## 2024-02-14 ENCOUNTER — Ambulatory Visit (INDEPENDENT_AMBULATORY_CARE_PROVIDER_SITE_OTHER): Payer: PPO

## 2024-02-14 VITALS — Ht 72.0 in | Wt 192.0 lb

## 2024-02-14 DIAGNOSIS — Z0000 Encounter for general adult medical examination without abnormal findings: Secondary | ICD-10-CM | POA: Diagnosis not present

## 2024-02-14 NOTE — Progress Notes (Signed)
 Subjective:   Kirk [name] is a 83 y.o. who presents for a Medicare Wellness preventive visit.  As a reminder, Annual Wellness Visits don't include a physical exam, and some assessments may be limited, especially if this visit is performed virtually. We may recommend an in-person visit if needed.  Visit Complete: Virtual I connected with  Kirk [name] on 02/14/24 by a video and audio enabled telemedicine application and verified that I am speaking with the correct person using two identifiers.  Patient Location: Home  Provider Location: Office/Clinic  I discussed the limitations of evaluation and management by telemedicine. The patient expressed understanding and agreed to proceed.  Vital Signs: Because this visit was a virtual/telehealth visit, some criteria may be missing or patient reported. Any vitals not documented were not able to be obtained and vitals that have been documented are patient reported.  VideoDeclined- This patient declined Librarian, academic. Therefore the visit was completed with audio only.  Persons Participating in Visit: Patient.  AWV Questionnaire: No: Patient Medicare AWV questionnaire was not completed prior to this visit.  Cardiac Risk Factors include: advanced age (>39men, >65 women);dyslipidemia;hypertension;male gender     Objective:     Today's Vitals   02/14/24 0807  Weight: 192 lb (87.1 kg)  Height: 6' (1.829 m)   Body mass index is 26.04 kg/m.     02/14/2024    8:12 AM 02/08/2023    1:51 PM 02/08/2023    8:10 AM 02/04/2023    1:54 PM 11/25/2022    2:31 PM 11/09/2021    1:04 PM 08/24/2021    9:15 AM  Advanced Directives  Does Patient Have a Medical Advance Directive? No No No No Yes Yes Yes  Type of Agricultural consultant;Living will Healthcare Power of [place];Living will   Copy of Healthcare Power of Attorney in Chart?      No - copy requested   Would patient like information on  creating a medical advance directive? No - Patient declined No - Patient declined No - Patient declined No - Patient declined       Current Medications (verified) Outpatient Encounter Medications as of 02/14/2024  Medication Sig   acetaminophen  (TYLENOL ) 325 MG tablet Take 650 mg by mouth every 6 (six) hours as needed (pain).    brimonidine  (ALPHAGAN ) 0.2 % ophthalmic solution Place 1 drop into both eyes 2 (two) times daily.    Cholecalciferol  25 MCG (1000 UT) tablet Take 1,000 Units by mouth daily.   lisinopril -hydrochlorothiazide  (ZESTORETIC ) 20-12.5 MG tablet TAKE 1/2 TABLET BY MOUTH DAILY   timolol  (TIMOPTIC ) 0.5 % ophthalmic solution Place 1 drop into the right eye daily.    No facility-administered encounter medications on file as of 02/14/2024.    Allergies (verified) Reserpine and Sulfa antibiotics   History: Past Medical History:  Diagnosis Date   Cataract    Congenital glaucoma    "both eyes operated on when I was 29 months old"   Cough    Disturbance of skin sensation    Diverticulosis of colon 2014   Diverticulum of esophagus, acquired    Dysphagia, unspecified(787.20)    Encounter for long-term (current) use of other medications    Herpes zoster without mention of complication    Hyperglycemia    patient denies   Hyperlipidemia    Hypertension    Hypertrophy of prostate without urinary obstruction and other lower urinary tract symptoms (LUTS)    Lumbago  Nevus, non-neoplastic    Osteoarthrosis, unspecified whether generalized or localized, unspecified site    pt. denies   Other premature beats    Pain in limb    Plantar fascial fibromatosis    Special screening for malignant neoplasm of prostate    Unspecified glaucoma(365.9)    Unspecified pruritic disorder    Unspecified vitamin D  deficiency    Urinary frequency    Zenker's diverticulum    Past Surgical History:  Procedure Laterality Date   CATARACT EXTRACTION BILATERAL W/ ANTERIOR VITRECTOMY  Bilateral 1977   COLONOSCOPY  09/06/2013   Kirk [name]   DIRECT LARYNGOSCOPY  10/22/2015   Cervical esophagoscopy.  Endoscopic esophageal diverticulotomy.    FLEXIBLE SIGMOIDOSCOPY  1999   GLAUCOMA SURGERY  1945   congenital glaucoma   HERNIA REPAIR  1993   INCISIONAL HERNIA REPAIR  2008   LARYNGOSCOPY     with stapling of zenkers diverticulum   LUMBAR LAMINECTOMY/ DECOMPRESSION WITH MET-RX Left 08/16/2019   Procedure: Left Lumbar Two-Three Minimally Invasive Laminectomy and Microdiscectomy;  Surgeon: Cannon Champion, MD;  Location: [facility] OR;  Service: Neurosurgery;  Laterality: Left;  Left Lumbar 2-3 Minimally invasive laminectomy and microdiscectomy   Family History  Problem Relation Age of Onset   Hypertension Mother    Dementia Mother    Kidney disease Father    CAD Father    Colon cancer Neg Hx    Throat cancer Neg Hx    Diabetes Neg Hx    Liver disease Neg Hx    Thyroid  disease Neg Hx    Social History   Socioeconomic History   Marital status: Married    Spouse name: Not on file   Number of children: Not on file   Years of education: Not on file   Highest education level: Not on file  Occupational History   Not on file  Tobacco Use   Smoking status: Never   Smokeless tobacco: Never  Vaping Use   Vaping status: Never Used  Substance and Sexual Activity   Alcohol use: No   Drug use: No   Sexual activity: Not on file  Other Topics Concern   Not on file  Social History Narrative   Married   Lives at home with wife Kirk [name])   No regular exercise, but does his own yard work and household chores.   Never smoked   Alcohol none   Social Drivers of Corporate investment banker Strain: Low Risk  (02/14/2024)   Overall Financial Resource Strain (CARDIA)    Difficulty of Paying Living Expenses: Not hard at all  Food Insecurity: No Food Insecurity (02/14/2024)   Hunger Vital Sign    Worried About Running Out of Food in the Last Year: Never true    Ran Out of Food in the  Last Year: Never true  Transportation Needs: No Transportation Needs (02/14/2024)   PRAPARE - Administrator, Civil Service (Medical): No    Lack of Transportation (Non-Medical): No  Physical Activity: Inactive (02/14/2024)   Exercise Vital Sign    Days of Exercise per Week: 0 days    Minutes of Exercise per Session: 0 min  Stress: No Stress Concern Present (02/14/2024)   [employer] of Occupational Health - Occupational Stress Questionnaire    Feeling of Stress : Not at all  Social Connections: Moderately Integrated (02/14/2024)   Social Connection and Isolation Panel [NHANES]    Frequency of Communication with Friends and Family: More than three times  a week    Frequency of Social Gatherings with Friends and Family: More than three times a week    Attends Religious Services: More than 4 times per year    Active Member of [employer] or Organizations: No    Attends Engineer, structural: Never    Marital Status: Married    Tobacco Counseling Counseling given: Not Answered    Clinical Intake:  Pre-visit preparation completed: Yes  Pain : No/denies pain     BMI - recorded: 26.04 Nutritional Status: BMI 25 -29 Overweight Diabetes: No  Lab Results  Component Value Date   HGBA1C 6.2 01/25/2018   HGBA1C 5.8 (H) 01/06/2017   HGBA1C 6.1 (H) 08/05/2014     How often do you need to have someone help you when you read instructions, pamphlets, or other written materials from your doctor or pharmacy?: 1 - Never  Interpreter Needed?: No  Information entered by :: Lamont Pilsner, LPN   Activities of Daily Living     02/14/2024    8:09 AM  In your present state of health, do you have any difficulty performing the following activities:  Hearing? 0  Vision? 0  Difficulty concentrating or making decisions? 0  Walking or climbing stairs? 1  Comment uses a walker  Dressing or bathing? 0  Doing errands, shopping? 0  Preparing Food and eating ? N  Using the  Toilet? N  In the past six months, have you accidently leaked urine? N  Do you have problems with loss of bowel control? N  Managing your Medications? N  Managing your Finances? N  Housekeeping or managing your Housekeeping? N    Patient Care Team: Rodney Clamp, MD as PCP - General (Family Medicine) Isidro Margo, DO as Consulting Physician (Family Medicine) [place], Peter M, MD as Consulting Physician (Cardiology)  Indicate any recent Medical Services you may have received from other than [facility] providers in the past year (date may be approximate).     Assessment:    This is a routine wellness examination for Kirk [name].  Hearing/Vision screen Hearing Screening - Comments:: Pt denies any hearing issues  Vision Screening - Comments:: Wears rx glasses - up to date with routine eye exams with [facility]     Goals Addressed             This Visit's Progress    Patient Stated       Each day, aim for 6 glasses of water, plenty of protein in your diet and try to get up and walk/ stretch every hour for 5-10 minutes at a time.         Depression Screen     02/14/2024    8:13 AM 02/08/2023    8:09 AM 12/16/2022   11:25 AM 04/28/2021    1:02 PM 11/13/2018    3:41 PM 10/05/2017    9:14 AM 12/22/2016    1:23 PM  PHQ 2/9 Scores  PHQ - 2 Score 0 0 0 0 0 0 0    Fall Risk     02/14/2024    8:16 AM 02/08/2023    8:12 AM 12/16/2022   11:24 AM 04/28/2021    1:02 PM 05/21/2019    1:37 PM  Fall Risk   Falls in the past year? 0 1 0 0 1  Number falls in past yr: 0 1 0 0 1  Injury with Fall? 0 1 1 0 0  Comment  left shoulder  Risk for fall due to : Impaired mobility;Impaired balance/gait Impaired balance/gait;Impaired mobility;Impaired vision Impaired mobility;History of fall(s)  History of fall(s);Impaired vision;Impaired mobility  Follow up Falls prevention discussed Falls prevention discussed   Education provided;Falls prevention discussed    MEDICARE RISK AT HOME:  Medicare Risk at  Home Any stairs in or around the home?: Yes If so, are there any without handrails?: No Home free of loose throw rugs in walkways, pet beds, electrical cords, etc?: Yes Adequate lighting in your home to reduce risk of falls?: Yes Life alert?: No Use of a cane, walker or w/c?: Yes Grab bars in the bathroom?: No Shower chair or bench in shower?: Yes Elevated toilet seat or a handicapped toilet?: No  TIMED UP AND GO:  Was the test performed?  No  Cognitive Function: 6CIT completed    12/22/2016    1:38 PM 08/20/2015    3:57 PM  MMSE - Mini Mental State Exam  Orientation to time 5 4  Orientation to Place 5 5  Registration 3 3  Attention/ Calculation 5 5  Recall 3 1  Language- name 2 objects 2 2  Language- repeat 1 1  Language- follow 3 step command 3 3  Language- read & follow direction 1 1  Write a sentence 1 1  Copy design 1 1  Total score 30 27        02/14/2024    8:18 AM 02/08/2023    8:14 AM  6CIT Screen  What Year? 0 points 0 points  What month? 0 points 0 points  What time? 0 points 0 points  Count back from 20 0 points 0 points  Months in reverse 0 points 0 points  Repeat phrase 0 points 0 points  Total Score 0 points 0 points    Immunizations Immunization History  Administered Date(s) Administered   IPV 01/05/1956, 03/03/1956, 10/10/1956, 12/22/1960   Influenza, High Dose Seasonal PF 07/21/2017   Influenza,inj,Quad PF,6+ Mos 07/13/2016   PFIZER(Purple Top)SARS-COV-2 Vaccination 11/09/2019, 01/21/2020   Pneumococcal Conjugate-13 01/12/2012   Smallpox 12/15/1960   Td 03/05/2011   Typhoid Live 12/15/1960, 12/22/1960, 12/31/1960    Screening Tests Health Maintenance  Topic Date Due   Zoster Vaccines- Shingrix (1 of 2) Never done   Pneumonia Vaccine 58+ Years old (2 of 2 - PPSV23) 01/11/2013   COVID-19 Vaccine (3 - Pfizer risk series) 02/18/2020   DTaP/Tdap/Td (2 - Tdap) 03/04/2021   INFLUENZA VACCINE  05/04/2024   Medicare Annual Wellness (AWV)   02/13/2025   HPV VACCINES  Aged Out   Meningococcal B Vaccine  Aged Out    Health Maintenance  Health Maintenance Due  Topic Date Due   Zoster Vaccines- Shingrix (1 of 2) Never done   Pneumonia Vaccine 53+ Years old (2 of 2 - PPSV23) 01/11/2013   COVID-19 Vaccine (3 - Pfizer risk series) 02/18/2020   DTaP/Tdap/Td (2 - Tdap) 03/04/2021   Health Maintenance Items Addressed: See Nurse Notes  Additional Screening:  Vision Screening: Recommended annual ophthalmology exams for early detection of glaucoma and other disorders of the eye.  Dental Screening: Recommended annual dental exams for proper oral hygiene  Community Resource Referral / Chronic Care Management: CRR required this visit?  No   CCM required this visit?  No   Plan:    I have personally reviewed and noted the following in the patient's chart:   Medical and social history Use of alcohol, tobacco or illicit drugs  Current medications and supplements including opioid prescriptions.  Patient is not currently taking opioid prescriptions. Functional ability and status Nutritional status Physical activity Advanced directives List of other physicians Hospitalizations, surgeries, and ER visits in previous 12 months Vitals Screenings to include cognitive, depression, and falls Referrals and appointments  In addition, I have reviewed and discussed with patient certain preventive protocols, quality metrics, and best practice recommendations. A written personalized care plan for preventive services as well as general preventive health recommendations were provided to patient.   Bruno Capri, LPN   1/61/0960   After Visit Summary: (MyChart) Due to this being a telephonic visit, the after visit summary with patients personalized plan was offered to patient via MyChart   Notes: Nothing significant to report at this time.

## 2024-02-14 NOTE — Patient Instructions (Addendum)
 Kirk [name] , Thank you for taking time out of your busy schedule to complete your Annual Wellness Visit with me. I enjoyed our conversation and look forward to speaking with you again next year. I, as well as your care team,  appreciate your ongoing commitment to your health goals. Please review the following plan we discussed and let me know if I can assist you in the future. Your Game plan/ To Do List    Referrals: If you haven't heard from the office you've been referred to, please reach out to them at the phone provided.   Follow up Visits: Next Medicare AWV with our clinical staff: 02/19/25 @8 :00 a.m    Have you seen your provider in the last 6 months (3 months if uncontrolled diabetes)? No, last appt 12/16/22 Next Office Visit with your provider: 03/16/24  Clinician Recommendations:  Aim for 30 minutes of exercise or brisk walking, 6-8 glasses of water, and 5 servings of fruits and vegetables each day.       This is a list of the screening recommended for you and due dates:  Health Maintenance  Topic Date Due   Zoster (Shingles) Vaccine (1 of 2) Never done   Pneumonia Vaccine (2 of 2 - PPSV23) 01/11/2013   COVID-19 Vaccine (3 - Pfizer risk series) 02/18/2020   DTaP/Tdap/Td vaccine (2 - Tdap) 03/04/2021   Medicare Annual Wellness Visit  02/08/2024   Flu Shot  05/04/2024   HPV Vaccine  Aged Out   Meningitis B Vaccine  Aged Out    Advanced directives: (Declined) Advance directive discussed with you today. Even though you declined this today, please call our office should you change your mind, and we can give you the proper paperwork for you to fill out. Advance Care Planning is important because it:  [x]  Makes sure you receive the medical care that is consistent with your values, goals, and preferences  [x]  It provides guidance to your family and loved ones and reduces their decisional burden about whether or not they are making the right decisions based on your wishes.  Follow the  link provided in your after visit summary or read over the paperwork we have mailed to you to help you started getting your Advance Directives in place. If you need assistance in completing these, please reach out to us  so that we can help you!  See attachments for Preventive Care and Fall Prevention Tips.

## 2024-03-16 ENCOUNTER — Encounter: Admitting: Family Medicine

## 2024-04-11 DIAGNOSIS — H402233 Chronic angle-closure glaucoma, bilateral, severe stage: Secondary | ICD-10-CM | POA: Diagnosis not present

## 2024-06-05 ENCOUNTER — Telehealth: Payer: Self-pay | Admitting: Family Medicine

## 2024-06-05 NOTE — Telephone Encounter (Signed)
 Advised to go to ED. Pt refused. Please advise   Name First: Kirk Last: [name] Gender: Male DOB: Aug 09, 1941 Age: 83 Y 5 M 12 D Return Phone Number: [phone] (Primary) Address: City/ State/ Zip: [place]  [address] Client [facility] - Human resources officer [facility] [employer] Provider Kennyth Bart- MD Contact Type Call Who Is Calling Patient / Member / Family / Caregiver Call Type Triage / Clinical Caller Name [name] Caller Phone Number [phone] Relationship To Patient Spouse Return Phone Number [phone] (Primary) Chief Complaint Leg Pain Reason for Call Symptomatic / Request for Health Information Initial Comment Caller states her husband has been having pain in his leg all weekend. She would like to know what he can take for pain. Translation No Nurse Assessment Nurse: Naval architect, RN, Kristacious Date/Time (Eastern Time): 06/04/2024 11:44:42 AM Confirm and document reason for call. If symptomatic, describe symptoms. ---Caller states husband has right leg and groin pain that started Friday. No known injury. Does the patient have any new or worsening symptoms? ---Yes Will a triage be completed? ---Yes Related visit to physician within the last 2 weeks? ---No Does the PT have any chronic conditions? (i.e. diabetes, asthma, this includes High risk factors for pregnancy, etc.) ---Yes List chronic conditions. ---legally blind Is this a behavioral health or substance abuse call? ---No Guidelines Guideline Title Affirmed Question Affirmed Notes Nurse Date/Time (Eastern Time) Groin Injury and Strain [1] MODERATE to SEVERE pain (e.g., interferes with normal activities, awakens from sleep, excruciating) AND Slatcher, RN, Kristacious 06/04/2024 11:47:56 AM Guidelines Guideline Title Affirmed Question Affirmed Notes Nurse Date/Time (Eastern Time) [2] no known injury or muscle strain Disp.  Time Titus Time) Disposition Final User 06/04/2024 11:53:45 AM Go to ED/UCC Now (or PCP triage) Yes Slatcher, RN, Kristacious Final Disposition 06/04/2024 11:53:45 AM Go to ED/UCC Now (or PCP triage) Yes Slatcher, RN, Kristacious Caller Disagree/Comply Disagree Caller Understands Yes PreDisposition Did not know what to do Care Advice Given Per Guideline GO TO ED/UCC NOW (OR PCP TRIAGE): * IF NO PCP (PRIMARY CARE PROVIDER) SECOND-LEVEL TRIAGE: You need to be seen within the next hour. Go to the ED/UCC at _____________ Hospital. Leave as soon as you can. CAUTION: See Sources of Care below when considering where to send the patient. CARE ADVICE given per Groin Injury and Strain (Adult) guideline. Comments User: Kristacious, Slatcher, RN Date/Time (Eastern Time): 06/04/2024 11:47:54 AM pain in groin area only but not anywhere else on leg User: Kristacious, Slatcher, RN Date/Time (Eastern Time): 06/04/2024 11:52:38 AM states he would like to see PCP tomorrow instead. Advised to call back in the morning to schedule the app. Referrals GO TO FACILITY REFUSED

## 2024-06-05 NOTE — Telephone Encounter (Signed)
 Patient has an office visit with PCP tomorrow

## 2024-06-06 ENCOUNTER — Ambulatory Visit (INDEPENDENT_AMBULATORY_CARE_PROVIDER_SITE_OTHER): Admitting: Family Medicine

## 2024-06-06 ENCOUNTER — Encounter: Payer: Self-pay | Admitting: Family Medicine

## 2024-06-06 ENCOUNTER — Ambulatory Visit (INDEPENDENT_AMBULATORY_CARE_PROVIDER_SITE_OTHER)

## 2024-06-06 VITALS — BP 160/83 | HR 71 | Temp 96.6°F | Ht 72.0 in | Wt 194.0 lb

## 2024-06-06 DIAGNOSIS — M25551 Pain in right hip: Secondary | ICD-10-CM | POA: Diagnosis not present

## 2024-06-06 DIAGNOSIS — E559 Vitamin D deficiency, unspecified: Secondary | ICD-10-CM

## 2024-06-06 DIAGNOSIS — R739 Hyperglycemia, unspecified: Secondary | ICD-10-CM

## 2024-06-06 DIAGNOSIS — R1031 Right lower quadrant pain: Secondary | ICD-10-CM

## 2024-06-06 DIAGNOSIS — I10 Essential (primary) hypertension: Secondary | ICD-10-CM | POA: Diagnosis not present

## 2024-06-06 DIAGNOSIS — E785 Hyperlipidemia, unspecified: Secondary | ICD-10-CM | POA: Diagnosis not present

## 2024-06-06 DIAGNOSIS — N400 Enlarged prostate without lower urinary tract symptoms: Secondary | ICD-10-CM

## 2024-06-06 DIAGNOSIS — H409 Unspecified glaucoma: Secondary | ICD-10-CM

## 2024-06-06 DIAGNOSIS — R7989 Other specified abnormal findings of blood chemistry: Secondary | ICD-10-CM

## 2024-06-06 DIAGNOSIS — M1611 Unilateral primary osteoarthritis, right hip: Secondary | ICD-10-CM | POA: Diagnosis not present

## 2024-06-06 LAB — LIPID PANEL
Cholesterol: 183 mg/dL (ref 0–200)
HDL: 41.3 mg/dL (ref >39.00)
LDL Cholesterol: 89 mg/dL (ref 0–99)
NonHDL: 141.25
Total CHOL/HDL Ratio: 4
Triglycerides: 259 mg/dL — ABNORMAL HIGH (ref 0.0–149.0)
VLDL: 51.8 mg/dL — ABNORMAL HIGH (ref 0.0–40.0)

## 2024-06-06 LAB — COMPREHENSIVE METABOLIC PANEL WITH GFR
ALT: 33 U/L (ref 0–53)
AST: 27 U/L (ref 0–37)
Albumin: 4.1 g/dL (ref 3.5–5.2)
Alkaline Phosphatase: 61 U/L (ref 39–117)
BUN: 11 mg/dL (ref 6–23)
CO2: 29 meq/L (ref 19–32)
Calcium: 9.4 mg/dL (ref 8.4–10.5)
Chloride: 98 meq/L (ref 96–112)
Creatinine, Ser: 0.73 mg/dL (ref 0.40–1.50)
GFR: 84.76 mL/min (ref >60.00)
Glucose, Bld: 113 mg/dL — ABNORMAL HIGH (ref 70–99)
Potassium: 3.9 meq/L (ref 3.5–5.1)
Sodium: 136 meq/L (ref 135–145)
Total Bilirubin: 0.9 mg/dL (ref 0.2–1.2)
Total Protein: 7.1 g/dL (ref 6.0–8.3)

## 2024-06-06 LAB — HEMOGLOBIN A1C: Hgb A1c MFr Bld: 7.2 % — ABNORMAL HIGH (ref 4.6–6.5)

## 2024-06-06 LAB — CBC
HCT: 49.7 % (ref 39.0–52.0)
Hemoglobin: 16.6 g/dL (ref 13.0–17.0)
MCHC: 33.5 g/dL (ref 30.0–36.0)
MCV: 90.7 fl (ref 78.0–100.0)
Platelets: 222 K/uL (ref 150.0–400.0)
RBC: 5.48 Mil/uL (ref 4.22–5.81)
RDW: 14.4 % (ref 11.5–15.5)
WBC: 9.1 K/uL (ref 4.0–10.5)

## 2024-06-06 LAB — URINALYSIS, ROUTINE W REFLEX MICROSCOPIC
Bilirubin Urine: NEGATIVE
Hgb urine dipstick: NEGATIVE
Ketones, ur: NEGATIVE
Leukocytes,Ua: NEGATIVE
Nitrite: NEGATIVE
Specific Gravity, Urine: 1.01 (ref 1.000–1.030)
Total Protein, Urine: NEGATIVE
Urine Glucose: 250 — AB
Urobilinogen, UA: 0.2 (ref 0.0–1.0)
pH: 6 (ref 5.0–8.0)

## 2024-06-06 LAB — PSA: PSA: 2.35 ng/mL (ref 0.10–4.00)

## 2024-06-06 LAB — TSH: TSH: 4.25 u[IU]/mL (ref 0.35–5.50)

## 2024-06-06 LAB — VITAMIN B12: Vitamin B-12: 298 pg/mL (ref 211–911)

## 2024-06-06 LAB — VITAMIN D 25 HYDROXY (VIT D DEFICIENCY, FRACTURES): VITD: 21.84 ng/mL — ABNORMAL LOW (ref 30.00–100.00)

## 2024-06-06 MED ORDER — PREDNISONE 50 MG PO TABS: ORAL_TABLET | ORAL | 0 refills | Status: DC

## 2024-06-06 NOTE — Patient Instructions (Signed)
 It was very nice to see you today!  VISIT SUMMARY: Today, we addressed your severe right groin pain, vision issues due to glaucoma, blood pressure, prostate health, dry cough, and possible cognitive decline. We also discussed general health maintenance and ordered necessary tests.  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 -We prescribed a 5-day course of prednisone . Please monitor for any eye pain or visual changes and stop taking prednisone  if they occur. Contact us  if your symptoms do not improve. -A right hip x-ray has been ordered.  UNSPECIFIED GLAUCOMA WITH SEVERE VISUAL IMPAIRMENT: Your vision is severely impaired due to glaucoma. -Monitor for any eye pain or visual changes while taking prednisone  and contact your eye doctor if symptoms occur.  ESSENTIAL HYPERTENSION: Your blood pressure was slightly elevated today, but your home readings are acceptable. -Continue to monitor your blood pressure at home and report if readings consistently exceed 150/90.  BENIGN PROSTATIC HYPERPLASIA WITHOUT LOWER URINARY TRACT SYMPTOMS: You have concerns about prostate cancer, but there are no urinary symptoms. -A PSA blood test and urinalysis have been ordered to screen for prostate issues.  DRY COUGH LIKELY SECONDARY TO LISINOPRIL : You have a dry cough likely due to your blood pressure medication, lisinopril . -Continue your current lisinopril  regimen as the cough is not bothersome enough to change medication.  GENERAL HEALTH MAINTENANCE: Routine health maintenance is overdue. -We have ordered comprehensive blood work including A1c, cholesterol, and thyroid  function tests.  Return if symptoms worsen or fail to improve.   Take care, Dr Kennyth  PLEASE NOTE:  If you had any lab tests, please let us  know if you have not heard back within a few days. You may see your  results on mychart before we have a chance to review them but we will give you a call once they are reviewed by us .   If we ordered any referrals today, please let us  know if you have not heard from their office within the next week.   If you had any urgent prescriptions sent in today, please check with the pharmacy within an hour of our visit to make sure the prescription was transmitted appropriately.   Please try these tips to maintain a healthy lifestyle:  Eat at least 3 REAL meals and 1-2 snacks per day.  Aim for no more than 5 hours between eating.  If you eat breakfast, please do so within one hour of getting up.   Each meal should contain half fruits/vegetables, one quarter protein, and one quarter carbs (no bigger than a computer mouse)  Cut down on sweet beverages. This includes juice, soda, and sweet tea.   Drink at least 1 glass of water with each meal and aim for at least 8 glasses per day  Exercise at least 150 minutes every week.

## 2024-06-06 NOTE — Assessment & Plan Note (Signed)
 Check lipids

## 2024-06-06 NOTE — Assessment & Plan Note (Signed)
Check A1c with labs. 

## 2024-06-06 NOTE — Progress Notes (Signed)
 Kirk [name] is a 83 y.o. male who presents today for an office visit.  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  No recent flares however given severity of pain we will check plain films today to further evaluate.  We did discuss treatment options.  He is previously on prednisone  however we have been hesitant to do this in the past due to his concurrent glaucoma.  He has recently discussed this with ophthalmology who have told him it would be fine for him to do short duration of prednisone .  We will start today 50 mg daily.  We did discuss potential side effects including slight increased risk for increasing his intraocular pressure.  We discussed warning signs for this and reasons to return to care.  He will let us  know if pain is not improving and would consider referral to PT or sports medicine.  Chronic Problems Addressed Today: BPH (benign prostatic hyperplasia) Overall symptoms are manageable.  He would like a PSA checked today due to concern for prostate cancer.  Will check this as well as UA and urine culture.  Hyperglycemia Check A1c with labs.  Hyperlipidemia Check lipids.  TSH elevation Check TSH.   Vitamin D  deficiency Check vitamin D .  Glaucoma Follows with ophthalmology for this.  We are starting prednisone  burst as above for his right groin pain that is likely an osteoarthritis flareup.  As above we did discuss increased risk for worsening glaucoma however he has discussed this with his ophthalmologist and they have told him we will be fine for him to do a short burst of prednisone .  We discussed warning signs for increased  IOP and reasons to return to care.  Essential hypertension Elevated today.  Home readings are at goal.  Was at goal last year as well.  May be elevated they consider him acute pain due to his chronic pain.  Will continue current dose lisinopril -HCTZ 20-12 point 5/2 tablet once  daily.  He is having some intermittent cough likely due to side effect of lisinopril  however this is currently manageable.  He does not wish to make any medication changes today.     Subjective:  HPI:  See assessment / plan for status of chronic conditions.   Discussed the use of AI scribe software for clinical note transcription with the patient, who gave verbal consent to proceed.  History of Present Illness Kirk [name] is an 83 year old male who presents with right groin pain.  He experiences severe pain in his right groin that radiates down into his leg, approximately six to seven inches. The pain significantly impairs his ability to walk, requiring the use of a walker and sometimes a wheelchair. No recent falls or changes in activity levels have occurred to explain the onset of pain, which he describes as having 'just come on me.' Walking exacerbates the pain, posing a substantial hindrance to his mobility.  Pain started suddenly few days ago.  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 Over the past four to six months, he has noticed a significant decline in his vision, estimating he is 'probably about ninety percent' unable to read. He is concerned about the potential for complete blindness. He recalls being prescribed prednisone  about five years ago. He was informed by his doctors that prednisone  could increase intraocular pressure, which may affect his glaucoma.   He expresses concern about prostate cancer, noting a past colostomy  about eight to ten years ago with no signs of cancer or polyps at that time. No urinary symptoms such as frequency or burning.  He reports a dry cough, which he attributes to his blood pressure medication, lisinopril , taken as a half pill daily. He notes issues with the pharmacy providing the correct dosage.         Objective:  Physical Exam: BP (!) 160/83   Pulse 71   Temp (!) 96.6 F (35.9 C) (Temporal)   Ht 6' (1.829 m)    Wt 194 lb (88 kg)   SpO2 96%   BMI 26.31 kg/m   Gen: No acute distress, resting comfortably CV: Regular rate and rhythm with no murmurs appreciated Pulm: Normal work of breathing, clear to auscultation bilaterally with no crackles, wheezes, or rhonchi MUSCULOSKELETAL Legs: No deformities.  Decreased range of motion with internal and external rotation of the right hip which reproduces his pain.  Pain also elicited with resisted extension.  Neurovascular intact distally. Neuro: Grossly normal, moves all extremities Psych: Normal affect and thought content      Anorah Trias M. Kennyth, MD 06/06/2024 11:02 AM

## 2024-06-06 NOTE — Assessment & Plan Note (Signed)
 Overall symptoms are manageable.  He would like a PSA checked today due to concern for prostate cancer.  Will check this as well as UA and urine culture.

## 2024-06-06 NOTE — Assessment & Plan Note (Signed)
 Follows with ophthalmology for this.  We are starting prednisone  burst as above for his right groin pain that is likely an osteoarthritis flareup.  As above we did discuss increased risk for worsening glaucoma however he has discussed this with his ophthalmologist and they have told him we will be fine for him to do a short burst of prednisone .  We discussed warning signs for increased  IOP and reasons to return to care.

## 2024-06-06 NOTE — Assessment & Plan Note (Signed)
 Check TSH

## 2024-06-06 NOTE — Assessment & Plan Note (Signed)
 Elevated today.  Home readings are at goal.  Was at goal last year as well.  May be elevated they consider him acute pain due to his chronic pain.  Will continue current dose lisinopril -HCTZ 20-12 point 5/2 tablet once daily.  He is having some intermittent cough likely due to side effect of lisinopril  however this is currently manageable.  He does not wish to make any medication changes today.

## 2024-06-06 NOTE — Assessment & Plan Note (Signed)
 Check vitamin D.

## 2024-06-07 ENCOUNTER — Ambulatory Visit: Payer: Self-pay | Admitting: Family Medicine

## 2024-06-07 LAB — URINE CULTURE
MICRO NUMBER:: 16916992
Result:: NO GROWTH
SPECIMEN QUALITY:: ADEQUATE

## 2024-06-07 NOTE — Progress Notes (Signed)
 X-ray confirms arthritis in his right hip.  He should let us  know if pain is not improving with the prednisone  we sent in for him yesterday.

## 2024-06-08 NOTE — Progress Notes (Signed)
 His urine culture is negative.  He does not have a UTI.  His blood sugar level is elevated.  He also has some sugar in his urine.  His blood sugar is elevated into the diabetic range.  Recommend he come back soon to discuss treatment options for this.  His vitamin D  is low.  Recommend starting 50,000 IUs weekly.  Please send a new prescription.  We should recheck this in 3 months.  B12 is on the lower range of normal.  Recommend starting 1000 mcg daily.  We should recheck in 3 to 6 months.  The rest of his labs are stable and we can recheck again in a year.

## 2024-06-11 NOTE — Telephone Encounter (Signed)
 Pt given lab and xray results per notes of Dr. Kennyth on 06/07/24. Pt verbalized understanding. Appointment scheduled for Thursday 06/14/24 at 1300 to discuss diabetes options. Advised Vitamin D  will be sent to the pharmacy.   Patient says the prednisone  helped the pain a little, he says he may need a pain pill. Advised to discuss at visit on Thursday, he says he will do that.    [id number] - [place], [place] - [address] Phone: [phone]  Fax: [phone]        Copied from CRM [id number]. Topic: Clinical - Lab/Test Results >> Jun 11, 2024 10:08 AM Kevelyn M wrote: Reason for CRM: Patient's wife calling in for test results. Please call back #[phone]. Can leave a message on voice mail.

## 2024-06-13 NOTE — Progress Notes (Signed)
Noted. We can discuss at upcoming appointment.

## 2024-06-14 ENCOUNTER — Ambulatory Visit (INDEPENDENT_AMBULATORY_CARE_PROVIDER_SITE_OTHER): Admitting: Family Medicine

## 2024-06-14 ENCOUNTER — Encounter: Payer: Self-pay | Admitting: Family Medicine

## 2024-06-14 VITALS — BP 112/74 | HR 85 | Temp 97.7°F | Ht 72.0 in | Wt 193.6 lb

## 2024-06-14 DIAGNOSIS — R739 Hyperglycemia, unspecified: Secondary | ICD-10-CM | POA: Diagnosis not present

## 2024-06-14 DIAGNOSIS — I10 Essential (primary) hypertension: Secondary | ICD-10-CM | POA: Diagnosis not present

## 2024-06-14 DIAGNOSIS — M1990 Unspecified osteoarthritis, unspecified site: Secondary | ICD-10-CM | POA: Diagnosis not present

## 2024-06-14 DIAGNOSIS — E559 Vitamin D deficiency, unspecified: Secondary | ICD-10-CM | POA: Diagnosis not present

## 2024-06-14 DIAGNOSIS — E538 Deficiency of other specified B group vitamins: Secondary | ICD-10-CM | POA: Insufficient documentation

## 2024-06-14 MED ORDER — VITAMIN D (ERGOCALCIFEROL) 1.25 MG (50000 UNIT) PO CAPS: 50000.0000 [IU] | ORAL_CAPSULE | ORAL | 0 refills | Status: DC

## 2024-06-14 MED ORDER — TRAMADOL HCL 50 MG PO TABS: 50.0000 mg | ORAL_TABLET | Freq: Three times a day (TID) | ORAL | 0 refills | Status: AC | PRN

## 2024-06-14 NOTE — Assessment & Plan Note (Signed)
 Had lengthy discussion with patient and his wife today regarding most recent A1c is 7.2.  They are not interested in starting medications at this point.  They are going to work on cutting down on sweets and carbs.  Will recheck A1c in 3 months.

## 2024-06-14 NOTE — Assessment & Plan Note (Signed)
 Blood pressure at goal today on lisinopril -HCTZ 20-12.5 half tablet daily.

## 2024-06-14 NOTE — Patient Instructions (Signed)
 It was very nice to see you today!  VISIT SUMMARY: Today, we discussed your ongoing right groin pain, elevated blood sugar levels, and vitamin deficiencies. We reviewed your current medications and made some adjustments to help manage your symptoms and improve your overall health.  YOUR PLAN: TYPE 2 DIABETES MELLITUS: Your recent lab work showed an elevated A1c of 7.2, indicating high blood sugar levels. -No medication is required at this time. Focus on dietary changes to manage your blood sugar levels. -Reduce your intake of sugar and carbohydrates, including sweets, sweet tea, and soda. -We will recheck your A1c in three months.  OSTEOARTHRITIS OF RIGHT HIP: You have ongoing pain in your right groin area due to osteoarthritis. -Continue taking Tylenol  and Advil  for pain management. -We have prescribed tramadol  for additional pain relief as needed. -Consider seeing an orthopedic specialist for further management, including possible cortisone injections.  VITAMIN D  DEFICIENCY: You have low vitamin D  levels. -We have prescribed vitamin D  supplements.  VITAMIN B12 DEFICIENCY: You have low vitamin B12 levels. -Continue taking vitamin B12 supplements.  Return in about 3 months (around 09/13/2024) for Follow Up.   Take care, Dr Kennyth  PLEASE NOTE:  If you had any lab tests, please let us  know if you have not heard back within a few days. You may see your results on mychart before we have a chance to review them but we will give you a call once they are reviewed by us .   If we ordered any referrals today, please let us  know if you have not heard from their office within the next week.   If you had any urgent prescriptions sent in today, please check with the pharmacy within an hour of our visit to make sure the prescription was transmitted appropriately.   Please try these tips to maintain a healthy lifestyle:  Eat at least 3 REAL meals and 1-2 snacks per day.  Aim for no more than 5  hours between eating.  If you eat breakfast, please do so within one hour of getting up.   Each meal should contain half fruits/vegetables, one quarter protein, and one quarter carbs (no bigger than a computer mouse)  Cut down on sweet beverages. This includes juice, soda, and sweet tea.   Drink at least 1 glass of water with each meal and aim for at least 8 glasses per day  Exercise at least 150 minutes every week.

## 2024-06-14 NOTE — Assessment & Plan Note (Signed)
 He will be starting vitamin D  50,000 IUs weekly.  Will recheck in 3 months.

## 2024-06-14 NOTE — Assessment & Plan Note (Signed)
 Had modest improvement with the prednisone  though still having quite a bit of pain.  He has been on tramadol  in the past and request refill today.  Previously tolerated well.  Will refill today though advised him to follow-up with orthopedics for further management.  He and his wife are agreeable to this plan.

## 2024-06-14 NOTE — Assessment & Plan Note (Signed)
 He is on B12 supplementation 1000 mcg daily.  We will recheck B12 in 3 months.

## 2024-06-14 NOTE — Progress Notes (Signed)
   Kirk [name] is a 83 y.o. male who presents today for an office visit.  Assessment/Plan:  Chronic Problems Addressed Today: Hyperglycemia Had lengthy discussion with patient and his wife today regarding most recent A1c is 7.2.  They are not interested in starting medications at this point.  They are going to work on cutting down on sweets and carbs.  Will recheck A1c in 3 months.  Essential hypertension Blood pressure at goal today on lisinopril -HCTZ 20-12.5 half tablet daily.   Vitamin D  deficiency He will be starting vitamin D  50,000 IUs weekly.  Will recheck in 3 months.  B12 deficiency He is on B12 supplementation 1000 mcg daily.  We will recheck B12 in 3 months.  Osteoarthritis Had modest improvement with the prednisone  though still having quite a bit of pain.  He has been on tramadol  in the past and request refill today.  Previously tolerated well.  Will refill today though advised him to follow-up with orthopedics for further management.  He and his wife are agreeable to this plan.     Subjective:  HPI:  See assessment / plan for status of chronic conditions.  Patient is here today for follow-up.  I saw him 8 days ago for right groin pain.  We also checked labs at that visit.  We did check plain film at his most recent office visit which showed osteoarthritis.  We started him on a prednisone  burst.  Labs were notable for elevated A1c at 7.2.   Discussed the use of AI scribe software for clinical note transcription with the patient, who gave verbal consent to proceed.  History of Present Illness Kirk [name] is an 83 year old male with osteoarthritis who presents with right groin pain and elevated blood sugar levels.  Eight days ago, he was evaluated for right groin pain, and an x-ray revealed osteoarthritis. He was started on a prednisone  burst, which has led to some improvement, but the pain persists in the right groin area. He is currently taking Tylenol  and Advil   for pain management. He recalls using tramadol  in the past but does not remember its effectiveness.  Recent lab work indicated an elevated A1c of 7.2. He consumes a diet high in sugar, including sweet tea, Coke, and sweets like cupcakes and donuts. His wife assists in managing his diet and medication.  He has low vitamin D  and B12 levels and has started taking vitamin B12 supplements. He has not yet received vitamin D  supplements. He has a history of neuropathy and back surgery, contributing to leg weakness. He uses a walker and sometimes a wheelchair due to weakness and mobility issues. Prednisone  has helped somewhat with his leg weakness.  He has a history of glaucoma and is concerned about the impact of medications on his vision. He has an upcoming appointment to check his eye pressure. No pain in the eyes but reports decreased vision. 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         Objective:  Physical Exam: BP 112/74   Pulse 85   Temp 97.7 F (36.5 C) (Temporal)   Ht 6' (1.829 m)   Wt 193 lb 9.6 oz (87.8 kg)   SpO2 94%   BMI 26.26 kg/m   Gen: No acute distress, resting comfortably Neuro: Grossly normal, moves all extremities Psych: Normal affect and thought content      Kirk Upshur M. Kennyth, MD 06/14/2024 1:33 PM

## 2024-06-21 DIAGNOSIS — H04123 Dry eye syndrome of bilateral lacrimal glands: Secondary | ICD-10-CM | POA: Diagnosis not present

## 2024-06-21 DIAGNOSIS — H402233 Chronic angle-closure glaucoma, bilateral, severe stage: Secondary | ICD-10-CM | POA: Diagnosis not present

## 2024-06-21 DIAGNOSIS — H2703 Aphakia, bilateral: Secondary | ICD-10-CM | POA: Diagnosis not present

## 2024-06-21 DIAGNOSIS — H1713 Central corneal opacity, bilateral: Secondary | ICD-10-CM | POA: Diagnosis not present

## 2024-08-13 DIAGNOSIS — H04123 Dry eye syndrome of bilateral lacrimal glands: Secondary | ICD-10-CM | POA: Diagnosis not present

## 2024-08-13 DIAGNOSIS — H402233 Chronic angle-closure glaucoma, bilateral, severe stage: Secondary | ICD-10-CM | POA: Diagnosis not present

## 2024-08-13 DIAGNOSIS — H1713 Central corneal opacity, bilateral: Secondary | ICD-10-CM | POA: Diagnosis not present

## 2024-08-13 DIAGNOSIS — H1045 Other chronic allergic conjunctivitis: Secondary | ICD-10-CM | POA: Diagnosis not present

## 2024-08-28 ENCOUNTER — Telehealth: Payer: Self-pay

## 2024-08-28 NOTE — Telephone Encounter (Signed)
 Patients wife came up to the office and expressed that she needed to have a nurse call patient back.I called and spoke to patient and he is requesting pain meds for right leg and right groin. Needs stronger than tramadol  as it was not helping.    [name] is the pharmacy.

## 2024-08-29 NOTE — Telephone Encounter (Signed)
 Needs appointment as anything stronger than tramadol  will be a controlled substance.

## 2024-09-02 ENCOUNTER — Other Ambulatory Visit: Payer: Self-pay | Admitting: Family Medicine

## 2024-09-03 NOTE — Telephone Encounter (Signed)
 Please advise on sending in a refill of the tramadol  before next appointment.  Please and thank you.

## 2024-09-03 NOTE — Telephone Encounter (Signed)
 Spoke with pt's spouse & stated pt is needing Tramadol  until his next visit on 09/13/24. Please advise

## 2024-09-04 MED ORDER — TRAMADOL HCL 50 MG PO TABS: 50.0000 mg | ORAL_TABLET | Freq: Two times a day (BID) | ORAL | 5 refills | Status: DC

## 2024-09-04 NOTE — Addendum Note (Signed)
 Addended by: Fredi Hurtado M on: 09/04/2024 07:37 AM   Modules accepted: Orders

## 2024-09-13 ENCOUNTER — Ambulatory Visit: Admitting: Family Medicine

## 2024-09-13 ENCOUNTER — Encounter: Payer: Self-pay | Admitting: Family Medicine

## 2024-09-13 VITALS — BP 130/80 | HR 93 | Temp 98.6°F | Ht 72.0 in

## 2024-09-13 DIAGNOSIS — E1165 Type 2 diabetes mellitus with hyperglycemia: Secondary | ICD-10-CM

## 2024-09-13 DIAGNOSIS — R739 Hyperglycemia, unspecified: Secondary | ICD-10-CM

## 2024-09-13 DIAGNOSIS — M1990 Unspecified osteoarthritis, unspecified site: Secondary | ICD-10-CM

## 2024-09-13 DIAGNOSIS — M48061 Spinal stenosis, lumbar region without neurogenic claudication: Secondary | ICD-10-CM

## 2024-09-13 LAB — POCT GLYCOSYLATED HEMOGLOBIN (HGB A1C): Hemoglobin A1C: 6.6 % — AB (ref 4.0–5.6)

## 2024-09-13 MED ORDER — TRAMADOL HCL 50 MG PO TABS: 50.0000 mg | ORAL_TABLET | Freq: Two times a day (BID) | ORAL | 5 refills | Status: AC

## 2024-09-13 MED ORDER — METHYLPREDNISOLONE ACETATE 40 MG/ML IJ SUSP
40.0000 mg | Freq: Once | INTRAMUSCULAR | Status: AC
  Administered 2024-09-13: 40 mg via INTRAMUSCULAR

## 2024-09-13 NOTE — Assessment & Plan Note (Signed)
 A1c improved to 6.6 via dietary modifications.  Recheck in 3 to 6 months.

## 2024-09-13 NOTE — Assessment & Plan Note (Addendum)
 Patient with worsening lower extremity symptoms likely secondary to his known history of lumbar spinal stenosis and lumbar radiculopathy.  He underwent laminectomy about 5 years ago but is still having persistent pain or weakness in the lower extremities.  We have discussed this multiple times here previously.  He has previously been using tramadol  for pain control however pain is worsening and he no longer feels like this is effective.  We had discussed steroid injection in the past however were hesitant due to his glaucoma.  He did discuss this with his ophthalmologist who advised it would be okay for him to go forward with this.  We did discuss further treatment options.  Given persistent and worsening of symptoms would be reasonable for him to go back to see a spinal specialist at this point.  His last MRI was about 3 years ago.  Will refer him to Dr. Gifford with [facility] as his wife currently sees him as well.  We will give 40 mg Depo-Medrol  today IM and increase his tramadol  to 50 to 100 mg twice daily as needed.  Will defer further management to orthopedics.  We discussed reasons to return to care for

## 2024-09-13 NOTE — Patient Instructions (Signed)
 It was very nice to see you today!  VISIT SUMMARY: During your visit, we discussed your worsening leg pain and mobility issues, as well as your improved blood sugar levels. We also reviewed your current pain management strategies and considered additional treatments.  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 Previous medications have not been very effective. -We administered a Depomedrol injection in your buttocks to help with the pain. -You are referred to Dr. Bonner for targeted cortisone injections. -Your tramadol  prescription was refilled. Take two tablets as needed for pain. -Continue your exercises to improve mobility and strength.  TYPE 2 DIABETES MELLITUS WITH HYPERGLYCEMIA: Your blood glucose control has improved, with your HbA1c reduced to 6.6%. -No medication is needed if your HbA1c remains in the 6% range. -Continue to monitor your blood glucose levels and reduce sugar intake to manage your blood glucose and potentially reduce pain.  Return in about 6 months (around 03/14/2025) for Follow Up.   Take care, Dr Kennyth  PLEASE NOTE:  If you had any lab tests, please let us  know if you have not heard back within a few days. You may see your results on mychart before we have a chance to review them but we will give you a call once they are reviewed by us .   If we ordered any referrals today, please let us  know if you have not heard from their office within the next week.   If you had any urgent prescriptions sent in today, please check with the pharmacy within an hour of our visit to make sure the prescription was transmitted appropriately.   Please try these tips to maintain a healthy lifestyle:  Eat at least 3 REAL meals and 1-2 snacks per day.  Aim for no more than 5 hours between eating.  If you eat breakfast, please do so  within one hour of getting up.   Each meal should contain half fruits/vegetables, one quarter protein, and one quarter carbs (no bigger than a computer mouse)  Cut down on sweet beverages. This includes juice, soda, and sweet tea.   Drink at least 1 glass of water with each meal and aim for at least 8 glasses per day  Exercise at least 150 minutes every week.

## 2024-09-13 NOTE — Addendum Note (Signed)
 Addended by: IDA ELORA HERO on: 09/13/2024 02:36 PM   Modules accepted: Orders

## 2024-09-13 NOTE — Progress Notes (Signed)
 Kirk [name] is a 83 y.o. male who presents today for an office visit.  Assessment/Plan:  Chronic Problems Addressed Today: Degenerative lumbar spinal stenosis Patient with worsening lower extremity symptoms likely secondary to his known history of lumbar spinal stenosis and lumbar radiculopathy.  He underwent laminectomy about 5 years ago but is still having persistent pain or weakness in the lower extremities.  We have discussed this multiple times here previously.  He has previously been using tramadol  for pain control however pain is worsening and he no longer feels like this is effective.  We had discussed steroid injection in the past however were hesitant due to his glaucoma.  He did discuss this with his ophthalmologist who advised it would be okay for him to go forward with this.  We did discuss further treatment options.  Given persistent and worsening of symptoms would be reasonable for him to go back to see a spinal specialist at this point.  His last MRI was about 3 years ago.  Will refer him to Dr. Gifford with [facility] as his wife currently sees him as well.  We will give 40 mg Depo-Medrol  today IM and increase his tramadol  to 50 to 100 mg twice daily as needed.  Will defer further management to orthopedics.  We discussed reasons to return to care for   Type 2 diabetes mellitus with hyperglycemia (HCC) A1c improved to 6.6 via dietary modifications.  Recheck in 3 to 6 months.     Subjective:  HPI:  See assessment / plan for status of chronic conditions.   Discussed the use of AI scribe software for clinical note transcription with the patient, who gave verbal consent to proceed.  History of Present Illness Kirk [name] is an 83 year old male who presents with worsening leg pain and mobility issues.  He experiences severe pain in his legs, particularly in the right leg, radiating to the groin. The pain is persistent and debilitating, significantly affecting his  ability to walk without assistance. He has been using a walker since his surgery on August 16, 2019, and has not been able to walk independently since then.  He has tried tramadol  for pain management, but it has not provided significant relief. Prednisone  was also used in the past without much benefit. Currently, he uses a CBD-based salve for pain, which provides some relief, but it is temporary.  He underwent surgery in November 2020 and has not seen the neurosurgeon since shortly after the surgery. Prior to the surgery, he received three cortisone shots in his back, which provided minimal relief. He has not had an MRI since the surgery, which was delayed due to the COVID-19 pandemic.  He reports a significant decline in his vision over the past six months and describes himself as 'ninety percent blind'.   He has improved his blood sugar levels from 7.3 to 6.6 by monitoring his sugar intake. High sugar intake seems to exacerbate his nerve pain.  He engages in some physical activity, including exercises like heel-to-toe movements and pool therapy, but his mobility is limited due to pain. He wants to improve his leg function and reduce pain to enhance his mobility.         Objective:  Physical Exam: BP 130/80   Pulse 93   Temp 98.6 F (37 C) (Temporal)   Ht 6' (1.829 m)   SpO2 94%   BMI 26.26 kg/m   Gen: No acute distress, resting comfortably CV: Regular rate and  rhythm with no murmurs appreciated Pulm: Normal work of breathing, clear to auscultation bilaterally with no crackles, wheezes, or rhonchi Neuro: Resting in wheelchair.  Weakness in lower extremities noted th moves all extremities spontaneouslyough  Psych: Normal affect and thought content      Kirk Fluegel M. Kennyth, MD 09/13/2024 2:26 PM

## 2025-02-19 ENCOUNTER — Ambulatory Visit
